# Patient Record
Sex: Male | Born: 1945 | Race: White | Hispanic: No | Marital: Married | Smoking: Former smoker
Health system: Southern US, Community
[De-identification: ages and names within clinical notes are randomized; demographics above are authoritative.]

## PROBLEM LIST (undated history)

## (undated) DIAGNOSIS — I878 Other specified disorders of veins: Secondary | ICD-10-CM

## (undated) DIAGNOSIS — G4730 Sleep apnea, unspecified: Secondary | ICD-10-CM

## (undated) DIAGNOSIS — I00 Rheumatic fever without heart involvement: Secondary | ICD-10-CM

## (undated) DIAGNOSIS — K635 Polyp of colon: Secondary | ICD-10-CM

## (undated) DIAGNOSIS — I714 Abdominal aortic aneurysm, without rupture, unspecified: Secondary | ICD-10-CM

## (undated) DIAGNOSIS — K5790 Diverticulosis of intestine, part unspecified, without perforation or abscess without bleeding: Secondary | ICD-10-CM

## (undated) DIAGNOSIS — E538 Deficiency of other specified B group vitamins: Secondary | ICD-10-CM

## (undated) DIAGNOSIS — N189 Chronic kidney disease, unspecified: Secondary | ICD-10-CM

## (undated) DIAGNOSIS — F329 Major depressive disorder, single episode, unspecified: Secondary | ICD-10-CM

## (undated) DIAGNOSIS — F419 Anxiety disorder, unspecified: Secondary | ICD-10-CM

## (undated) DIAGNOSIS — M1990 Unspecified osteoarthritis, unspecified site: Secondary | ICD-10-CM

## (undated) DIAGNOSIS — Z933 Colostomy status: Secondary | ICD-10-CM

## (undated) DIAGNOSIS — D499 Neoplasm of unspecified behavior of unspecified site: Secondary | ICD-10-CM

## (undated) DIAGNOSIS — F32A Depression, unspecified: Secondary | ICD-10-CM

## (undated) DIAGNOSIS — K5792 Diverticulitis of intestine, part unspecified, without perforation or abscess without bleeding: Secondary | ICD-10-CM

## (undated) DIAGNOSIS — I10 Essential (primary) hypertension: Secondary | ICD-10-CM

## (undated) DIAGNOSIS — B351 Tinea unguium: Secondary | ICD-10-CM

## (undated) DIAGNOSIS — D179 Benign lipomatous neoplasm, unspecified: Secondary | ICD-10-CM

## (undated) DIAGNOSIS — E785 Hyperlipidemia, unspecified: Secondary | ICD-10-CM

## (undated) HISTORY — PX: COLOSTOMY REVERSAL: SHX5782

## (undated) HISTORY — PX: CYST REMOVAL LEG: SHX6280

## (undated) HISTORY — PX: COLOSTOMY: SHX63

## (undated) HISTORY — PX: KNEE ARTHROSCOPY: SUR90

## (undated) HISTORY — PX: LIPOMA EXCISION: SHX5283

## (undated) HISTORY — PX: LITHOTRIPSY: SUR834

## (undated) HISTORY — PX: COLONOSCOPY W/ POLYPECTOMY: SHX1380

---

## 2004-10-25 ENCOUNTER — Ambulatory Visit: Payer: Self-pay

## 2006-02-08 ENCOUNTER — Emergency Department: Payer: Self-pay | Admitting: Emergency Medicine

## 2006-02-08 ENCOUNTER — Other Ambulatory Visit: Payer: Self-pay

## 2008-06-09 ENCOUNTER — Ambulatory Visit: Payer: Self-pay | Admitting: Surgery

## 2008-06-09 ENCOUNTER — Ambulatory Visit: Payer: Self-pay | Admitting: Cardiology

## 2008-06-09 ENCOUNTER — Other Ambulatory Visit: Payer: Self-pay

## 2008-06-12 ENCOUNTER — Ambulatory Visit: Payer: Self-pay | Admitting: Surgery

## 2009-06-11 ENCOUNTER — Ambulatory Visit: Payer: Self-pay | Admitting: Internal Medicine

## 2010-02-04 ENCOUNTER — Ambulatory Visit: Payer: Self-pay | Admitting: Unknown Physician Specialty

## 2012-09-30 ENCOUNTER — Ambulatory Visit: Payer: Self-pay | Admitting: General Practice

## 2012-09-30 LAB — BASIC METABOLIC PANEL
Anion Gap: 3 — ABNORMAL LOW (ref 7–16)
Calcium, Total: 9.3 mg/dL (ref 8.5–10.1)
Chloride: 106 mmol/L (ref 98–107)
EGFR (African American): 57 — ABNORMAL LOW
EGFR (Non-African Amer.): 49 — ABNORMAL LOW
Glucose: 87 mg/dL (ref 65–99)
Osmolality: 283 (ref 275–301)

## 2012-09-30 LAB — URINALYSIS, COMPLETE
Glucose,UR: NEGATIVE mg/dL (ref 0–75)
Ketone: NEGATIVE
Leukocyte Esterase: NEGATIVE
Ph: 6 (ref 4.5–8.0)
Protein: 30
RBC,UR: 2 /HPF (ref 0–5)
Squamous Epithelial: <1

## 2012-09-30 LAB — CBC
MCV: 93 fL (ref 80–100)
RDW: 14 % (ref 11.5–14.5)

## 2012-09-30 LAB — PROTIME-INR: Prothrombin Time: 12.9 secs (ref 11.5–14.7)

## 2012-09-30 LAB — SEDIMENTATION RATE: Erythrocyte Sed Rate: 6 mm/hr (ref 0–20)

## 2012-09-30 LAB — APTT: Activated PTT: 29.3 secs (ref 23.6–35.9)

## 2012-09-30 LAB — MRSA PCR SCREENING

## 2012-10-16 ENCOUNTER — Inpatient Hospital Stay: Payer: Self-pay | Admitting: General Practice

## 2012-10-16 HISTORY — PX: TOTAL KNEE ARTHROPLASTY: SHX125

## 2012-10-17 LAB — BASIC METABOLIC PANEL
BUN: 25 mg/dL — ABNORMAL HIGH (ref 7–18)
Calcium, Total: 8.1 mg/dL — ABNORMAL LOW (ref 8.5–10.1)
Chloride: 106 mmol/L (ref 98–107)
Co2: 25 mmol/L (ref 21–32)
EGFR (African American): >60
EGFR (Non-African Amer.): 55 — ABNORMAL LOW
Osmolality: 280 (ref 275–301)
Sodium: 138 mmol/L (ref 136–145)

## 2012-10-17 LAB — HEMOGLOBIN: HGB: 11.7 g/dL — ABNORMAL LOW (ref 13.0–18.0)

## 2012-10-17 LAB — PLATELET COUNT: Platelet: 137 10*3/uL — ABNORMAL LOW (ref 150–440)

## 2012-10-18 LAB — BASIC METABOLIC PANEL
Anion Gap: 3 — ABNORMAL LOW (ref 7–16)
BUN: 20 mg/dL — ABNORMAL HIGH (ref 7–18)
Co2: 30 mmol/L (ref 21–32)
Creatinine: 1.3 mg/dL (ref 0.60–1.30)
EGFR (African American): >60
EGFR (Non-African Amer.): 57 — ABNORMAL LOW
Sodium: 138 mmol/L (ref 136–145)

## 2013-09-07 IMAGING — CR DG KNEE 1-2V*L*
1 series · 2 of 2 positions shown · non-contrast
Comparison: none

REASON FOR EXAM: postop
COMMENTS:   Bedside (portable):Y

[Series 1: ap · 0.17mm/px · 2 of 2 slices shown]
[im 1/2]
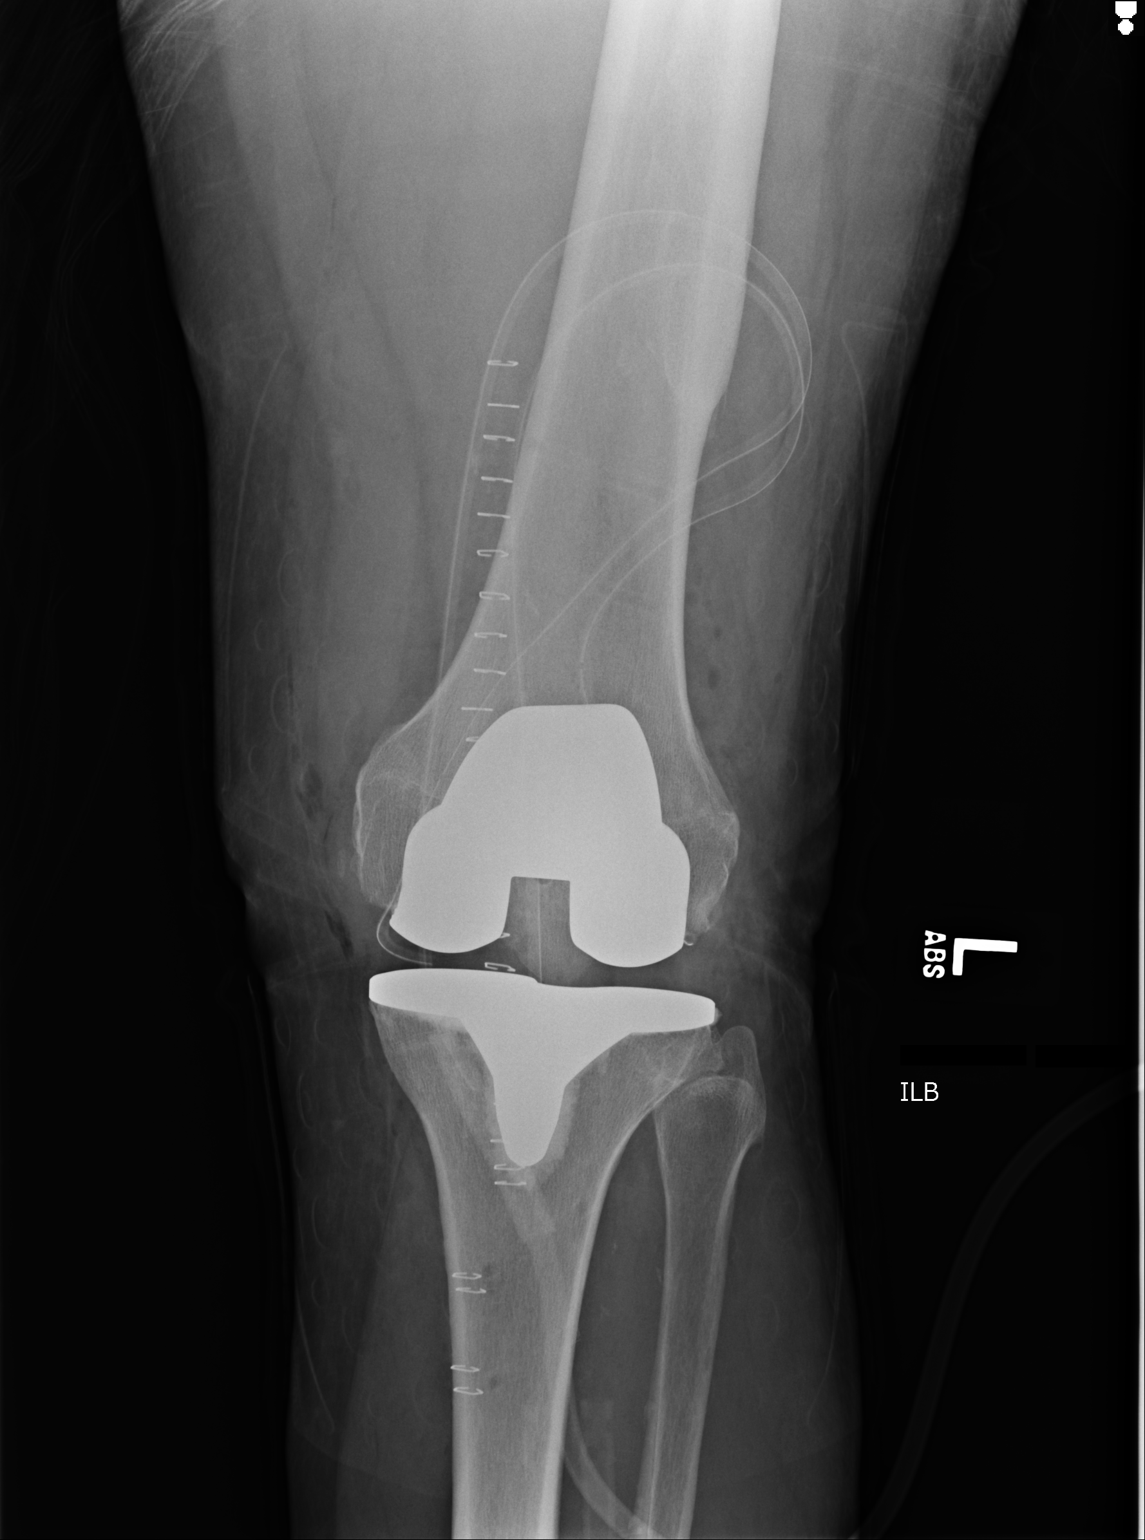
[im 2/2]
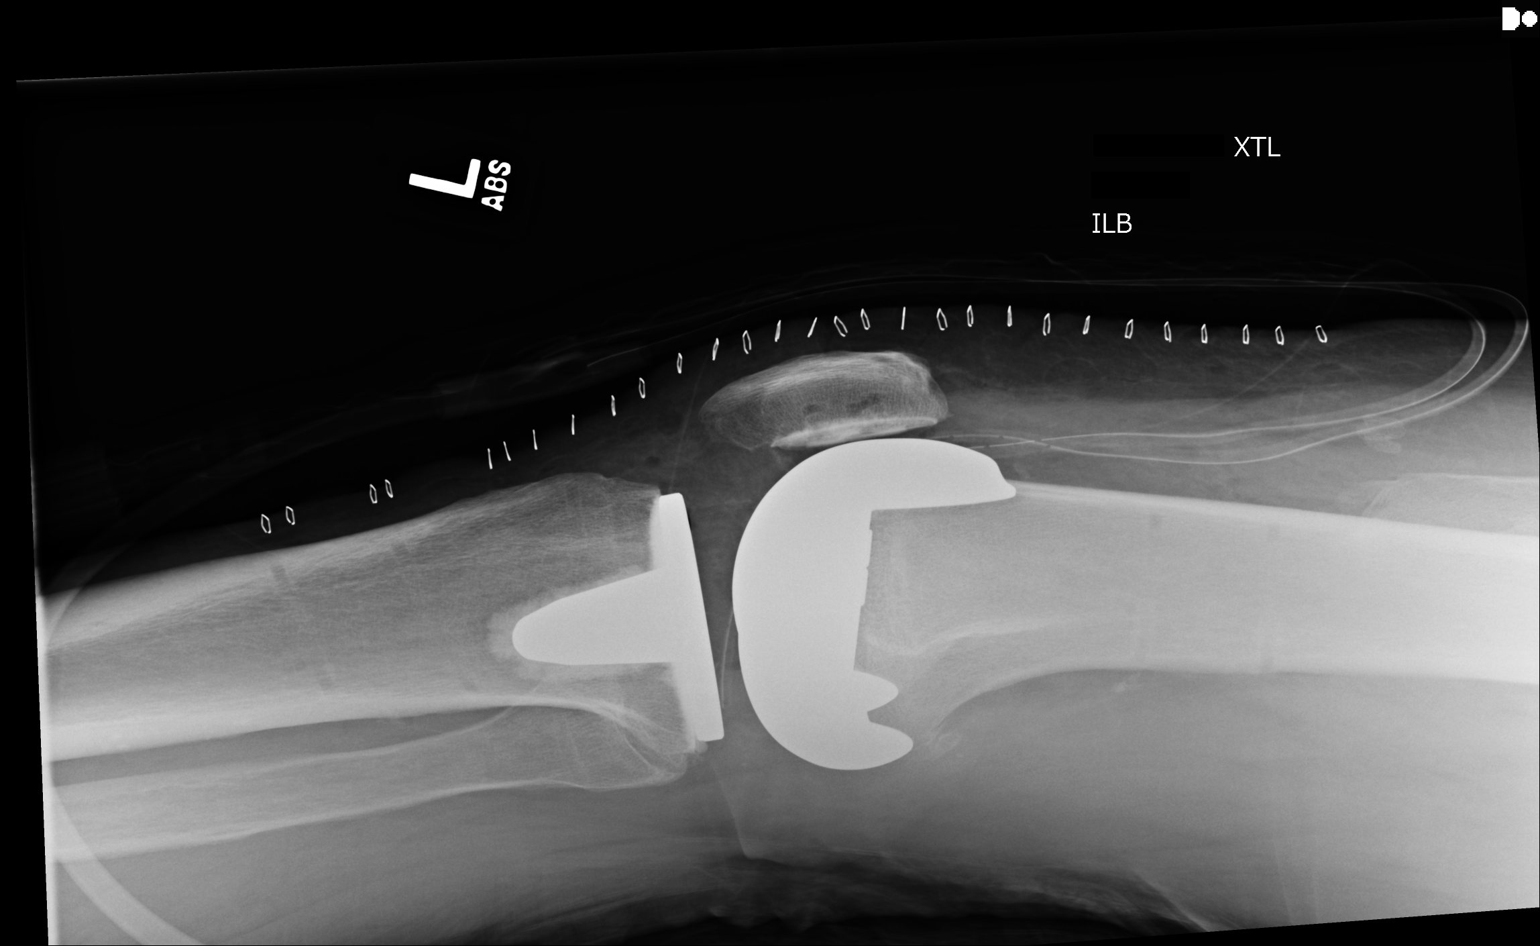

[2 of 2 positions shown; findings below may reference images not displayed]

PROCEDURE:     DXR - DXR KNEE LEFT AP AND LATERAL  - October 16, 2012 [DATE]

RESULT:     AP and lateral portable views of the left knee reveal the
patient to have undergone total joint prosthesis placement. Radiographic
positioning of the prosthetic components is good. Surgical drain lines and
skin staples are present.
IMPRESSION: The patient has undergone left total knee joint prosthesis
placement. Further interpretation is deferred to Dr. Laferia.

[REDACTED]

## 2014-12-17 DIAGNOSIS — N183 Chronic kidney disease, stage 3 unspecified: Secondary | ICD-10-CM | POA: Insufficient documentation

## 2014-12-22 ENCOUNTER — Ambulatory Visit: Payer: Self-pay | Admitting: Internal Medicine

## 2014-12-23 DIAGNOSIS — I714 Abdominal aortic aneurysm, without rupture, unspecified: Secondary | ICD-10-CM | POA: Insufficient documentation

## 2015-02-04 ENCOUNTER — Ambulatory Visit: Payer: Self-pay | Admitting: Unknown Physician Specialty

## 2015-02-07 ENCOUNTER — Emergency Department: Payer: Self-pay | Admitting: Emergency Medicine

## 2015-02-26 ENCOUNTER — Other Ambulatory Visit: Payer: Self-pay | Admitting: Internal Medicine

## 2015-02-26 DIAGNOSIS — I714 Abdominal aortic aneurysm, without rupture, unspecified: Secondary | ICD-10-CM

## 2015-03-02 NOTE — Discharge Summary (Signed)
PATIENT NAME:  Alexander Howe, Alexander Howe MR#:  [id number] DATE OF BIRTH:  11/10/1946  DATE OF ADMISSION:  10/16/2012 DATE OF DISCHARGE:  10/19/2012  ADMITTING DIAGNOSIS: Degenerative arthrosis left knee.   DISCHARGE DIAGNOSIS: Degenerative arthrosis left knee.   HISTORY: Patient is a pleasant 69 year old who has been followed at [facility] for progression of left knee pain. The patient had reported several year history of progressive bilateral knee pain. The left was noted to be more symptomatic than the right. His pain had increased in intensity. It was noted to be aggravated with weight-bearing activities. He had localized most of the pain along the medial aspect of the knee. He had also reported some near giving way of the knee as well as activity-related swelling. Patient had reported only modest improvement with the use of anti-inflammatory medications. He had undergone several series of Synvisc injections with the most recent series of injections providing very little relief of his discomfort. The pain had progressed to the point that it was significantly interfering with his activities of daily living. X-rays taken in [place] showed narrowing of the medial cartilage space with associated varus alignment. He was noted to have osteophyte as well as subchondral sclerosis.   PROCEDURE:  (dictation cut off here) dictation continued on a different page  ____________________________ Vance Peper, PA jrw:cms D: 10/21/2012 10:30:57 ET T: 10/21/2012 10:44:15 ET JOB#: [id number]  cc: Vance Peper, PA, <Dictator>  [name] PA ELECTRONICALLY SIGNED 10/22/2012 7:53

## 2015-03-02 NOTE — Op Note (Signed)
PATIENT NAME:  Alexander Howe, Alexander Howe MR#:  [id number] DATE OF BIRTH:  11/28/1945  DATE OF PROCEDURE:  10/16/2012  PREOPERATIVE DIAGNOSIS: Degenerative arthrosis of the left knee.   POSTOPERATIVE DIAGNOSIS: Degenerative arthrosis of the left knee.   PROCEDURE PERFORMED: Left total knee arthroplasty using computer-assisted navigation.   SURGEON: Laurice Record. Hooten, M.D.   ASSISTANT: Vance Peper, PA-C (required to maintain retraction throughout the procedure)   ANESTHESIA: Femoral nerve block and spinal.   ESTIMATED BLOOD LOSS: 350 mL.   FLUIDS REPLACED: 1500 mL of crystalloid.   TOURNIQUET TIME: 108 minutes.   DRAINS: Two medium drains to reinfusion system.   SOFT TISSUE RELEASES: Anterior cruciate ligament, posterior cruciate ligament, deep and superficial medial collateral ligament, and patellofemoral ligament.   IMPLANTS UTILIZED: DePuy PFC Sigma size 5 posterior stabilized femoral component (cemented), size 6 MBT tibial component (cemented), 41 mm three peg oval dome patella (cemented), and a 10 mm stabilized rotating platform polyethylene insert.   INDICATIONS FOR SURGERY: The patient is a 69 year old male who has been seen for complaints of progressive left knee pain. X-rays demonstrated severe degenerative changes in tricompartmental fashion with relative varus alignment. He denies any significant improvement despite activity modification, intraarticular corticosteroid injections, and Synvisc injections. After discussion of the risks and benefits of surgical intervention, the patient expressed his understanding of the risks and benefits and agreed with plans for surgical intervention.   PROCEDURE IN DETAIL: The patient was brought into the Operating Room and, after adequate femoral nerve block and spinal anesthesia was achieved, a tourniquet was placed on the patient's upper left thigh. The patient's left knee and leg were cleaned and prepped with alcohol and DuraPrep and draped in the usual  sterile fashion. A "time out" was performed as per usual protocol. The left lower extremity was exsanguinated using an Esmarch, and the tourniquet was inflated to 300 mmHg. An anterior longitudinal incision was made followed by a standard mid vastus approach. A moderate effusion was evacuated. The deep fibers of the medial collateral ligament were elevated in subperiosteal fashion off the medial flare of the tibia so as to maintain a continuous soft tissue sleeve. The patella was subluxed laterally and the patellofemoral ligament was incised. Inspection of the knee demonstrated severe degenerative changes in a tricompartmental fashion. Prominent osteophytes were debrided using a rongeur. Anterior and posterior cruciate ligaments were excised. Two 4.0 mm Schanz pins were inserted into the femur and into the tibia for attachment of the array of trackers used for computer-assisted navigation. Hip center was identified using a circumduction technique. Distal landmarks were mapped using the computer. The distal femur and proximal tibia were mapped using the computer. Distal femoral cutting guide was positioned using computer-assisted navigation so as to achieve 5 degree distal valgus cut. The cut was performed and verified using the computer. The distal femur was sized and it was felt that a size 5 femoral component was appropriate. A size 5 cutting guide was positioned and the anterior cut was performed and verified using the computer. This was followed by completion of the posterior and chamfer cuts. Femoral cutting guide for the central box was then positioned and central box cut was performed.   Attention was then directed to the proximal tibia. Medial and lateral menisci were excised. The extramedullary tibial cutting guide was positioned using computer-assisted navigation so as to achieve 0 degree varus valgus alignment and 0 degree posterior slope. The cut was performed and verified using the computer. The  proximal tibia was  sized and it was felt that a size 6 tibial tray was appropriate. Tibial and femoral trials were inserted followed by insertion of a 10 mm polyethylene insert. The knee was felt to be somewhat tight medially. A Cobb elevator was used to elevate the superficial fibers of the medial collateral ligament. This allowed for good medial and lateral soft tissue balancing both in flexion and in extension. Finally, the patella was cut and prepared so as to accommodate a 41 mm three peg oval dome patella. Patellar trial was placed and the knee was placed through a range of motion with excellent patellar tracking appreciated.   Polyethylene trial was removed. Prominent posterior osteophytes were debrided off of the femur using curved osteotomes and curettes. Central post hole for the tibial component was reamed followed by insertion of a keel punch. Trial components were removed. The cut surfaces of bone were irrigated with copious amounts of normal saline with antibiotic solution using pulsatile lavage and then suctioned dry. Polymethyl methacrylate cement was prepared in the usual fashion using a vacuum mixer. Cement was applied to the cut surface of the proximal tibia as well as along the undersurface of a size 6 MBT tibial component. The tibial component was positioned and impacted into place. Excess cement was removed using freer elevators. Cement was then applied to the cut surface of the femur as well as along the posterior flanges of a size 5 posterior stabilized femoral component. Femoral component was positioned and impacted into place. Excess cement was removed using freer elevators. A 10 mm polyethylene trial was inserted and the knee was brought in full extension with steady axial compression applied. Finally, cement was applied to the backside of a 41 mm three peg oval dome patella and the patellar component was positioned and patellar clamp applied. Excess cement was removed using freer  elevators.   After adequate curing of cement, the tourniquet was deflated after total tourniquet time of 108 minutes. Hemostasis was achieved using electrocautery. The knee was irrigated with copious amounts of normal saline with antibiotic solution using pulsatile lavage and then suctioned dry. The knee was inspected for any residual cement debris. 30 mL of 0.25% Marcaine with epinephrine was injected along the posterior capsule. A 10 mm stabilized rotating platform polyethylene insert was inserted and the knee was placed through a range of motion. Excellent patellar tracking was appreciated and excellent mediolateral soft tissue balancing was appreciated.   Two medium drains were placed in the wound bed and brought out through a separate stab incision to be attached to a reinfusion system. The medial parapatellar portion of the incision was reapproximated using interrupted sutures of #1 Vicryl. The subcutaneous tissue was approximated in layers using first #0 Vicryl followed by #2-0 Vicryl. Skin was closed with skin staples. A sterile dressing was applied.   The patient tolerated the procedure well. He was transported to the recovery room in stable condition. ____________________________ Laurice Record. Holley Bouche., MD jph:slb D: 10/16/2012 12:12:26 ET    T: 10/16/2012 12:53:30 ET       JOB#: [id number] cc: Jeneen Rinks P. Holley Bouche., MD, <Dictator> JAMES P Holley Bouche MD ELECTRONICALLY SIGNED 10/16/2012 20:28

## 2015-03-02 NOTE — Discharge Summary (Signed)
PATIENT NAME:  Alexander Howe, Alexander Howe MR#:  [id number] DATE OF BIRTH:  1946/01/12  DATE OF ADMISSION:  10/16/2012 DATE OF DISCHARGE:  10/19/2012  ADDENDUM (Continuation):   After discussion of the risks and benefits of surgical intervention, the patient expressed his understanding of the risks and benefits and agreed for plans for surgical intervention.   PROCEDURE: Left total knee arthroplasty using computer-assisted navigation.   ANESTHESIA: Femoral nerve block with spinal.   SOFT TISSUE RELEASE: Anterior cruciate ligament, posterior cruciate ligament, deep and superficial medial collateral ligaments, as well as the patellofemoral ligament.   IMPLANTS UTILIZED: DePuy PFC Sigma size 5 posterior stabilized femoral component (cemented), size 6 MBT tibial component (cemented), 41 mm three pegged oval dome patella (cemented), and a 10 mm stabilized rotating platform polyethylene insert.   HOSPITAL COURSE: The patient tolerated the procedure very well. He had no complications. He was then taken to PAC-U where he was stabilized and then transferred to the orthopedic floor. The patient began receiving anticoagulation therapy of Lovenox 40 mg sub-Q q.12 hours per anesthesia and pharmacy protocol. He was fitted with TED stockings bilaterally. These were allowed to be removed one hour per eight hour shift. The left one was applied on day two following removal of the Hemovac and dressing change. The patient was also fitted with the AVI compression foot pumps bilaterally set at 80 mmHg. His calves have been nontender. There has been no evidence of any deep venous thromboses. Negative Homans sign. Heels were elevated off the bed using rolled towels.   The patient has denied any chest pain or shortness of breath. He did use CPAP while in the hospital. Vital signs have been stable. He has been afebrile. Hemodynamically he was stable. No transfusions were given other than the Autovac transfusions for six hours  postoperatively.   The patient began receiving physical therapy on day one for gait training and transfers. He has done very well. Upon being discharged he was ambulating greater than 200 feet. He was independent with bed-to-chair transfers. He was able to go up and down four sets of steps. Occupational therapy was also initiated on day one for activities of daily living and assistive devices.   The patient's IV, Foley, and Hemovac were discontinued on day two along with a dressing change. The wound was free of any drainage or any signs of infection. The Polar Care was reapplied to the surgical leg maintaining a temperature of 40 to 50 degrees Fahrenheit.   DISPOSITION: The patient is discharged to home in improved stable condition.   DISCHARGE INSTRUCTIONS: 1. Continue weightbearing as tolerated.  2. Continue using a walker until cleared by physical therapy to go to a quad cane.  3. He will receive home health PT.  4. He is to continue with TED stockings. These are to be worn during the day but may be removed at night.  5. Continue Polar Care maintaining a temperature of 40 to 50 degrees Fahrenheit. Recommend he use this around-the-clock until seen in the office in two weeks. He does have a follow-up appointment in the clinic in two weeks.  6. He is to call the clinic sooner if any temperatures of 101.5 or greater or excessive bleeding.  7. He is placed on a regular diet.  8. He is to resume his regular medications he was on prior to admission. He was given a prescription for Lovenox 40 mg sub-Q daily for 14 days, then discontinue and begin taking one 81 mg enteric-coated aspirin.  Also, a prescription for Ultram 1 to 2 tablets q.4 to 6 hours p.r.n. for pain and oxycodone 5 to 10 mg q.4 to 6 hours p.r.n. for pain.   PAST MEDICAL HISTORY:  1. Hyperlipidemia.  2. Hypertension.  3. Shingles on hand.  4. Kidney stones x2. 5. History of rheumatic fever without history of cardiac murmur while he was  in the 7th grade.  6. Sleep apnea.  7. History of colon polyps.  8. History of diverticulosis. 9. Arthritis. 10. Anxiety with mild depression.    11. Venostasis with chronic leg edema.  12. History of onychomycosis.   ____________________________ Vance Peper, PA jrw:drc D: 10/21/2012 10:44:30 ET T: 10/21/2012 10:58:09 ET JOB#: [id number]  cc: Vance Peper, PA, <Dictator> Sade Mehlhoff PA ELECTRONICALLY SIGNED 10/22/2012 7:53

## 2015-03-08 LAB — SURGICAL PATHOLOGY

## 2015-04-29 ENCOUNTER — Other Ambulatory Visit: Payer: Self-pay | Admitting: Physician Assistant

## 2015-04-29 DIAGNOSIS — M898X9 Other specified disorders of bone, unspecified site: Secondary | ICD-10-CM

## 2015-05-05 ENCOUNTER — Ambulatory Visit
Admission: RE | Admit: 2015-05-05 | Discharge: 2015-05-05 | Disposition: A | Discharge status: Home / Self Care | Payer: Medicare PPO | Source: Ambulatory Visit | Attending: Physician Assistant | Admitting: Physician Assistant

## 2015-05-05 DIAGNOSIS — M25462 Effusion, left knee: Secondary | ICD-10-CM | POA: Insufficient documentation

## 2015-05-05 DIAGNOSIS — M25562 Pain in left knee: Secondary | ICD-10-CM | POA: Diagnosis present

## 2015-05-05 DIAGNOSIS — M898X9 Other specified disorders of bone, unspecified site: Secondary | ICD-10-CM

## 2015-05-11 ENCOUNTER — Other Ambulatory Visit: Payer: Self-pay | Admitting: Orthopedic Surgery

## 2015-05-11 DIAGNOSIS — IMO0002 Reserved for concepts with insufficient information to code with codable children: Secondary | ICD-10-CM

## 2015-05-11 DIAGNOSIS — R229 Localized swelling, mass and lump, unspecified: Principal | ICD-10-CM

## 2015-05-12 ENCOUNTER — Ambulatory Visit
Admission: RE | Admit: 2015-05-12 | Discharge: 2015-05-12 | Disposition: A | Discharge status: Home / Self Care | Payer: Medicare PPO | Source: Ambulatory Visit | Attending: Orthopedic Surgery | Admitting: Orthopedic Surgery

## 2015-05-12 DIAGNOSIS — R229 Localized swelling, mass and lump, unspecified: Principal | ICD-10-CM

## 2015-05-12 DIAGNOSIS — IMO0002 Reserved for concepts with insufficient information to code with codable children: Secondary | ICD-10-CM

## 2015-06-22 ENCOUNTER — Other Ambulatory Visit: Payer: Self-pay | Admitting: Internal Medicine

## 2015-06-22 DIAGNOSIS — I714 Abdominal aortic aneurysm, without rupture, unspecified: Secondary | ICD-10-CM

## 2015-06-28 ENCOUNTER — Ambulatory Visit
Admission: RE | Admit: 2015-06-28 | Discharge: 2015-06-28 | Disposition: A | Discharge status: Home / Self Care | Payer: Medicare PPO | Source: Ambulatory Visit | Attending: Internal Medicine | Admitting: Internal Medicine

## 2015-06-28 DIAGNOSIS — I714 Abdominal aortic aneurysm, without rupture, unspecified: Secondary | ICD-10-CM

## 2015-06-28 DIAGNOSIS — N281 Cyst of kidney, acquired: Secondary | ICD-10-CM | POA: Insufficient documentation

## 2015-11-16 DIAGNOSIS — E7800 Pure hypercholesterolemia, unspecified: Secondary | ICD-10-CM | POA: Insufficient documentation

## 2015-11-16 DIAGNOSIS — G4710 Hypersomnia, unspecified: Secondary | ICD-10-CM | POA: Insufficient documentation

## 2015-11-16 DIAGNOSIS — M1990 Unspecified osteoarthritis, unspecified site: Secondary | ICD-10-CM | POA: Insufficient documentation

## 2015-11-16 DIAGNOSIS — F32A Depression, unspecified: Secondary | ICD-10-CM | POA: Insufficient documentation

## 2015-11-16 DIAGNOSIS — I10 Essential (primary) hypertension: Secondary | ICD-10-CM | POA: Insufficient documentation

## 2015-11-16 DIAGNOSIS — F419 Anxiety disorder, unspecified: Secondary | ICD-10-CM | POA: Insufficient documentation

## 2015-12-14 ENCOUNTER — Encounter
Admission: RE | Admit: 2015-12-14 | Discharge: 2015-12-14 | Disposition: A | Discharge status: Home / Self Care | Payer: Medicare PPO | Source: Ambulatory Visit | Attending: Orthopedic Surgery | Admitting: Orthopedic Surgery

## 2015-12-14 DIAGNOSIS — Z01812 Encounter for preprocedural laboratory examination: Secondary | ICD-10-CM | POA: Insufficient documentation

## 2015-12-14 DIAGNOSIS — Z01810 Encounter for preprocedural cardiovascular examination: Secondary | ICD-10-CM | POA: Diagnosis not present

## 2015-12-14 DIAGNOSIS — G8929 Other chronic pain: Secondary | ICD-10-CM | POA: Diagnosis not present

## 2015-12-14 DIAGNOSIS — M25561 Pain in right knee: Secondary | ICD-10-CM | POA: Insufficient documentation

## 2015-12-14 HISTORY — DX: Abdominal aortic aneurysm, without rupture: I71.4

## 2015-12-14 HISTORY — DX: Abdominal aortic aneurysm, without rupture, unspecified: I71.40

## 2015-12-14 LAB — URINALYSIS COMPLETE WITH MICROSCOPIC (ARMC ONLY)
Bacteria, UA: NONE SEEN
Bilirubin Urine: NEGATIVE
GLUCOSE, UA: NEGATIVE mg/dL
HGB URINE DIPSTICK: NEGATIVE
Ketones, ur: NEGATIVE mg/dL
Leukocytes, UA: NEGATIVE
Nitrite: NEGATIVE
Protein, ur: NEGATIVE mg/dL
SQUAMOUS EPITHELIAL / LPF: NONE SEEN
Specific Gravity, Urine: 1.017 (ref 1.005–1.030)
pH: 6 (ref 5.0–8.0)

## 2015-12-14 LAB — CBC
HEMATOCRIT: 45.6 % (ref 40.0–52.0)
HEMOGLOBIN: 15.2 g/dL (ref 13.0–18.0)
MCH: 31.5 pg (ref 26.0–34.0)
MCHC: 33.3 g/dL (ref 32.0–36.0)
MCV: 94.5 fL (ref 80.0–100.0)
Platelets: 151 10*3/uL (ref 150–440)
RBC: 4.82 MIL/uL (ref 4.40–5.90)
RDW: 13.9 % (ref 11.5–14.5)
WBC: 5.8 10*3/uL (ref 3.8–10.6)

## 2015-12-14 LAB — TYPE AND SCREEN
ABO/RH(D): A POS
ANTIBODY SCREEN: NEGATIVE

## 2015-12-14 LAB — BASIC METABOLIC PANEL
ANION GAP: 6 (ref 5–15)
BUN: 23 mg/dL — ABNORMAL HIGH (ref 6–20)
CALCIUM: 10 mg/dL (ref 8.9–10.3)
CO2: 31 mmol/L (ref 22–32)
Chloride: 104 mmol/L (ref 101–111)
Creatinine, Ser: 1.27 mg/dL — ABNORMAL HIGH (ref 0.61–1.24)
GFR, EST NON AFRICAN AMERICAN: 56 mL/min — AB (ref >60)
Glucose, Bld: 88 mg/dL (ref 65–99)
Potassium: 3.5 mmol/L (ref 3.5–5.1)
SODIUM: 141 mmol/L (ref 135–145)

## 2015-12-14 LAB — PROTIME-INR
INR: 1.06
PROTHROMBIN TIME: 14 s (ref 11.4–15.0)

## 2015-12-14 LAB — APTT: APTT: 28 s (ref 24–36)

## 2015-12-14 LAB — SEDIMENTATION RATE: Sed Rate: 10 mm/hr (ref 0–20)

## 2015-12-14 LAB — ABO/RH: ABO/RH(D): A POS

## 2015-12-14 LAB — SURGICAL PCR SCREEN
MRSA, PCR: NEGATIVE
STAPHYLOCOCCUS AUREUS: NEGATIVE

## 2015-12-14 NOTE — Patient Instructions (Signed)
  Your procedure is scheduled on: 12/27/15 Mon Report to Day Surgery.[facility] To find out your arrival time please call [phone] between 1PM - 3PM on 12/24/15 Fri  Remember: Instructions that are not followed completely may result in serious medical risk, up to and including death, or upon the discretion of your surgeon and anesthesiologist your surgery may need to be rescheduled.    _x___ 1. Do not eat food or drink liquids after midnight. No gum chewing or hard candies.     ____ 2. No Alcohol for 24 hours before or after surgery.   ____ 3. Bring all medications with you on the day of surgery if instructed.    __x_ 4. Notify your doctor if there is any change in your medical condition     (cold, fever, infections).     Do not wear jewelry, make-up, hairpins, clips or nail polish.  Do not wear lotions, powders, or perfumes. You may wear deodorant.  Do not shave 48 hours prior to surgery. Men may shave face and neck.  Do not bring valuables to the hospital.    [facility] is not responsible for any belongings or valuables.               Contacts, dentures or bridgework may not be worn into surgery.  Leave your suitcase in the car. After surgery it may be brought to your room.  For patients admitted to the hospital, discharge time is determined by your                treatment team.   Patients discharged the day of surgery will not be allowed to drive home.   Please read over the following fact sheets that you were given:   MRSA Information   _x___ Take these medicines the morning of surgery with A SIP OF WATER:    1. amLODipine (NORVASC) 5 MG tablet  2. ALPRAZolam (XANAX) 0.5 MG tablet  3. buPROPion (ZYBAN) 150 MG 12 hr tablet  4.pantoprazole (PROTONIX) 40 MG tablet  5.PARoxetine (PAXIL) 10 MG tablet  6.quinapril (ACCUPRIL) 40 MG tablet  ____ Fleet Enema (as directed)   _x___ Use CHG Soap as directed  ____ Use inhalers on the day of surgery  ____ Stop metformin  2 days prior to surgery    ____ Take 1/2 of usual insulin dose the night before surgery and none on the morning of surgery.   _x___ Stop Coumadin/Plavix/aspirin on Stop aspirin 1 week before surgery  ____ Stop Anti-inflammatories on    ____ Stop supplements until after surgery.    _x___ Bring C-Pap to the hospital.

## 2015-12-16 LAB — URINE CULTURE

## 2015-12-22 NOTE — Pre-Procedure Instructions (Signed)
Urine culture results multiple species present suggest recollection, faxed to Dr. Clydell Hakim office asking if recollection is needed.

## 2015-12-23 DIAGNOSIS — F325 Major depressive disorder, single episode, in full remission: Secondary | ICD-10-CM | POA: Insufficient documentation

## 2015-12-24 NOTE — OR Nursing (Signed)
Per note in chart from [facility] Staff, "it is not necessary to repeat urine culture".

## 2015-12-27 ENCOUNTER — Inpatient Hospital Stay: Payer: Medicare PPO | Admitting: Anesthesiology

## 2015-12-27 ENCOUNTER — Inpatient Hospital Stay: Payer: Medicare PPO

## 2015-12-27 ENCOUNTER — Inpatient Hospital Stay
Admission: RE | Admit: 2015-12-27 | Discharge: 2015-12-30 | DRG: 470 | Disposition: A | Discharge status: Home Health | Payer: Medicare PPO | Source: Ambulatory Visit | Attending: Orthopedic Surgery | Admitting: Orthopedic Surgery

## 2015-12-27 ENCOUNTER — Encounter: Payer: Self-pay | Admitting: *Deleted

## 2015-12-27 ENCOUNTER — Encounter
Admission: RE | Disposition: A | Discharge status: Home Health | Payer: Self-pay | Source: Ambulatory Visit | Attending: Orthopedic Surgery

## 2015-12-27 DIAGNOSIS — Z6833 Body mass index (BMI) 33.0-33.9, adult: Secondary | ICD-10-CM | POA: Diagnosis not present

## 2015-12-27 DIAGNOSIS — G8929 Other chronic pain: Secondary | ICD-10-CM | POA: Diagnosis present

## 2015-12-27 DIAGNOSIS — M1711 Unilateral primary osteoarthritis, right knee: Secondary | ICD-10-CM | POA: Diagnosis present

## 2015-12-27 DIAGNOSIS — I129 Hypertensive chronic kidney disease with stage 1 through stage 4 chronic kidney disease, or unspecified chronic kidney disease: Secondary | ICD-10-CM | POA: Diagnosis present

## 2015-12-27 DIAGNOSIS — F419 Anxiety disorder, unspecified: Secondary | ICD-10-CM | POA: Diagnosis present

## 2015-12-27 DIAGNOSIS — F329 Major depressive disorder, single episode, unspecified: Secondary | ICD-10-CM | POA: Diagnosis present

## 2015-12-27 DIAGNOSIS — N189 Chronic kidney disease, unspecified: Secondary | ICD-10-CM | POA: Diagnosis present

## 2015-12-27 DIAGNOSIS — M25761 Osteophyte, right knee: Secondary | ICD-10-CM | POA: Diagnosis present

## 2015-12-27 DIAGNOSIS — Z87891 Personal history of nicotine dependence: Secondary | ICD-10-CM

## 2015-12-27 DIAGNOSIS — G4730 Sleep apnea, unspecified: Secondary | ICD-10-CM | POA: Diagnosis present

## 2015-12-27 DIAGNOSIS — R140 Abdominal distension (gaseous): Secondary | ICD-10-CM

## 2015-12-27 DIAGNOSIS — Z96659 Presence of unspecified artificial knee joint: Secondary | ICD-10-CM

## 2015-12-27 HISTORY — DX: Depression, unspecified: F32.A

## 2015-12-27 HISTORY — DX: Diverticulitis of intestine, part unspecified, without perforation or abscess without bleeding: K57.92

## 2015-12-27 HISTORY — DX: Anxiety disorder, unspecified: F41.9

## 2015-12-27 HISTORY — DX: Colostomy status: Z93.3

## 2015-12-27 HISTORY — DX: Essential (primary) hypertension: I10

## 2015-12-27 HISTORY — DX: Sleep apnea, unspecified: G47.30

## 2015-12-27 HISTORY — PX: KNEE ARTHROPLASTY: SHX992

## 2015-12-27 HISTORY — DX: Major depressive disorder, single episode, unspecified: F32.9

## 2015-12-27 HISTORY — DX: Rheumatic fever without heart involvement: I00

## 2015-12-27 HISTORY — DX: Unspecified osteoarthritis, unspecified site: M19.90

## 2015-12-27 HISTORY — DX: Neoplasm of unspecified behavior of unspecified site: D49.9

## 2015-12-27 HISTORY — DX: Other specified disorders of veins: I87.8

## 2015-12-27 HISTORY — DX: Chronic kidney disease, unspecified: N18.9

## 2015-12-27 SURGERY — ARTHROPLASTY, KNEE, TOTAL, USING IMAGELESS COMPUTER-ASSISTED NAVIGATION
Anesthesia: Spinal | Laterality: Right

## 2015-12-27 MED ORDER — PROPOFOL 10 MG/ML IV BOLUS
INTRAVENOUS | Status: DC | PRN
  Administered 2015-12-27: 30 mg via INTRAVENOUS

## 2015-12-27 MED ORDER — BUPROPION HCL ER (SR) 150 MG PO TB12
150.0000 mg | ORAL_TABLET | Freq: Two times a day (BID) | ORAL | Status: DC
Start: 2015-12-27 — End: 2015-12-30
  Administered 2015-12-27 – 2015-12-30 (×6): 150 mg via ORAL
  Filled 2015-12-27 (×6): qty 1

## 2015-12-27 MED ORDER — CEFAZOLIN SODIUM-DEXTROSE 2-3 GM-% IV SOLR
2.0000 g | Freq: Four times a day (QID) | INTRAVENOUS | Status: AC
  Administered 2015-12-27 – 2015-12-28 (×4): 2 g via INTRAVENOUS
  Filled 2015-12-27 (×5): qty 50

## 2015-12-27 MED ORDER — PROPOFOL 500 MG/50ML IV EMUL
INTRAVENOUS | Status: DC | PRN
  Administered 2015-12-27: 75 ug/kg/min via INTRAVENOUS

## 2015-12-27 MED ORDER — SODIUM CHLORIDE FLUSH 0.9 % IV SOLN
INTRAVENOUS | Status: AC
  Filled 2015-12-27: qty 3

## 2015-12-27 MED ORDER — FUROSEMIDE 20 MG PO TABS: 20.0000 mg | ORAL_TABLET | Freq: Every day | ORAL | Status: DC | PRN

## 2015-12-27 MED ORDER — MAGNESIUM HYDROXIDE 400 MG/5ML PO SUSP
30.0000 mL | Freq: Every day | ORAL | Status: DC | PRN
  Administered 2015-12-28: 30 mL via ORAL
  Filled 2015-12-27: qty 30

## 2015-12-27 MED ORDER — PHENOL 1.4 % MT LIQD
1.0000 | OROMUCOSAL | Status: DC | PRN
Start: 2015-12-27 — End: 2015-12-30

## 2015-12-27 MED ORDER — ACETAMINOPHEN 10 MG/ML IV SOLN
1000.0000 mg | Freq: Four times a day (QID) | INTRAVENOUS | Status: AC
  Administered 2015-12-27 – 2015-12-28 (×4): 1000 mg via INTRAVENOUS
  Filled 2015-12-27 (×4): qty 100

## 2015-12-27 MED ORDER — ONDANSETRON HCL 4 MG/2ML IJ SOLN: 4.0000 mg | Freq: Once | INTRAMUSCULAR | Status: DC | PRN

## 2015-12-27 MED ORDER — PHENYLEPHRINE HCL 10 MG/ML IJ SOLN
INTRAMUSCULAR | Status: DC | PRN
  Administered 2015-12-27: 100 ug via INTRAVENOUS

## 2015-12-27 MED ORDER — VITAMIN D 1000 UNITS PO TABS
2000.0000 [IU] | ORAL_TABLET | Freq: Every day | ORAL | Status: DC
  Administered 2015-12-27 – 2015-12-30 (×4): 2000 [IU] via ORAL
  Filled 2015-12-27 (×4): qty 2

## 2015-12-27 MED ORDER — LACTATED RINGERS IV SOLN
INTRAVENOUS | Status: DC
Start: 2015-12-27 — End: 2015-12-27
  Administered 2015-12-27 (×3): via INTRAVENOUS

## 2015-12-27 MED ORDER — OXYCODONE HCL 5 MG PO TABS
5.0000 mg | ORAL_TABLET | ORAL | Status: DC | PRN
  Administered 2015-12-27: 5 mg via ORAL
  Administered 2015-12-27 – 2015-12-28 (×3): 10 mg via ORAL
  Administered 2015-12-28: 5 mg via ORAL
  Administered 2015-12-28 (×2): 10 mg via ORAL
  Administered 2015-12-28: 5 mg via ORAL
  Administered 2015-12-29: 10 mg via ORAL
  Administered 2015-12-29: 5 mg via ORAL
  Filled 2015-12-27 (×2): qty 2
  Filled 2015-12-27 (×2): qty 1
  Filled 2015-12-27: qty 2
  Filled 2015-12-27: qty 1
  Filled 2015-12-27 (×3): qty 2
  Filled 2015-12-27: qty 1

## 2015-12-27 MED ORDER — ONDANSETRON HCL 4 MG/2ML IJ SOLN
INTRAMUSCULAR | Status: DC | PRN
Start: 2015-12-27 — End: 2015-12-27
  Administered 2015-12-27: 4 mg via INTRAVENOUS

## 2015-12-27 MED ORDER — MAGNESIUM OXIDE 400 (241.3 MG) MG PO TABS
400.0000 mg | ORAL_TABLET | Freq: Every day | ORAL | Status: DC
  Administered 2015-12-27 – 2015-12-30 (×4): 400 mg via ORAL
  Filled 2015-12-27 (×4): qty 1

## 2015-12-27 MED ORDER — DIPHENHYDRAMINE HCL 12.5 MG/5ML PO ELIX: 12.5000 mg | ORAL_SOLUTION | ORAL | Status: DC | PRN

## 2015-12-27 MED ORDER — CEFAZOLIN SODIUM-DEXTROSE 2-3 GM-% IV SOLR: 2.0000 g | Freq: Once | INTRAVENOUS | Status: DC

## 2015-12-27 MED ORDER — SODIUM CHLORIDE 0.9 % IV SOLN
INTRAVENOUS | Status: DC | PRN
  Administered 2015-12-27: 60 mL

## 2015-12-27 MED ORDER — PAROXETINE HCL 10 MG PO TABS
10.0000 mg | ORAL_TABLET | Freq: Every day | ORAL | Status: DC
  Administered 2015-12-28 – 2015-12-30 (×3): 10 mg via ORAL
  Filled 2015-12-27 (×3): qty 1

## 2015-12-27 MED ORDER — TRAMADOL HCL 50 MG PO TABS
50.0000 mg | ORAL_TABLET | ORAL | Status: DC | PRN
  Administered 2015-12-27 (×2): 50 mg via ORAL
  Administered 2015-12-28 – 2015-12-29 (×4): 100 mg via ORAL
  Filled 2015-12-27 (×2): qty 1
  Filled 2015-12-27 (×4): qty 2

## 2015-12-27 MED ORDER — OMEGA-3-ACID ETHYL ESTERS 1 G PO CAPS
1.0000 g | ORAL_CAPSULE | Freq: Every day | ORAL | Status: DC
  Administered 2015-12-28 – 2015-12-30 (×3): 1 g via ORAL
  Filled 2015-12-27 (×3): qty 1

## 2015-12-27 MED ORDER — BUPIVACAINE-EPINEPHRINE (PF) 0.25% -1:200000 IJ SOLN
INTRAMUSCULAR | Status: AC
  Filled 2015-12-27: qty 30

## 2015-12-27 MED ORDER — FENTANYL CITRATE (PF) 100 MCG/2ML IJ SOLN: 25.0000 ug | INTRAMUSCULAR | Status: DC | PRN

## 2015-12-27 MED ORDER — ACETAMINOPHEN 10 MG/ML IV SOLN
INTRAVENOUS | Status: DC | PRN
  Administered 2015-12-27: 1000 mg via INTRAVENOUS

## 2015-12-27 MED ORDER — KETAMINE HCL 10 MG/ML IJ SOLN
INTRAMUSCULAR | Status: DC | PRN
  Administered 2015-12-27: 50 mg via INTRAVENOUS

## 2015-12-27 MED ORDER — MORPHINE SULFATE (PF) 2 MG/ML IV SOLN
2.0000 mg | INTRAVENOUS | Status: DC | PRN
  Administered 2015-12-27 (×2): 2 mg via INTRAVENOUS
  Filled 2015-12-27 (×2): qty 1

## 2015-12-27 MED ORDER — METOPROLOL SUCCINATE ER 100 MG PO TB24
100.0000 mg | ORAL_TABLET | Freq: Every day | ORAL | Status: DC
  Administered 2015-12-27 – 2015-12-29 (×3): 100 mg via ORAL
  Filled 2015-12-27 (×3): qty 1

## 2015-12-27 MED ORDER — SODIUM CHLORIDE 0.9 % IV SOLN
INTRAVENOUS | Status: DC
  Administered 2015-12-27 – 2015-12-28 (×2): via INTRAVENOUS

## 2015-12-27 MED ORDER — NEOMYCIN-POLYMYXIN B GU 40-200000 IR SOLN
Status: AC
  Filled 2015-12-27: qty 20

## 2015-12-27 MED ORDER — ONDANSETRON HCL 4 MG/2ML IJ SOLN
4.0000 mg | Freq: Four times a day (QID) | INTRAMUSCULAR | Status: DC | PRN
  Administered 2015-12-27 – 2015-12-29 (×2): 4 mg via INTRAVENOUS
  Filled 2015-12-27 (×2): qty 2

## 2015-12-27 MED ORDER — SODIUM CHLORIDE 0.9 % IV SOLN
Freq: Once | INTRAVENOUS | Status: AC
  Administered 2015-12-27: 16:00:00 via INTRAVENOUS

## 2015-12-27 MED ORDER — BISACODYL 10 MG RE SUPP
10.0000 mg | Freq: Every day | RECTAL | Status: DC | PRN
  Administered 2015-12-29: 10 mg via RECTAL
  Filled 2015-12-27: qty 1

## 2015-12-27 MED ORDER — CEFAZOLIN SODIUM-DEXTROSE 2-3 GM-% IV SOLR
INTRAVENOUS | Status: AC
Start: 2015-12-27 — End: 2015-12-27
  Administered 2015-12-27: 2 g via INTRAVENOUS
  Filled 2015-12-27: qty 50

## 2015-12-27 MED ORDER — ONDANSETRON HCL 4 MG PO TABS: 4.0000 mg | ORAL_TABLET | Freq: Four times a day (QID) | ORAL | Status: DC | PRN

## 2015-12-27 MED ORDER — HYDROCHLOROTHIAZIDE 25 MG PO TABS
25.0000 mg | ORAL_TABLET | Freq: Every day | ORAL | Status: DC
  Administered 2015-12-28 – 2015-12-29 (×2): 25 mg via ORAL
  Filled 2015-12-27 (×2): qty 1

## 2015-12-27 MED ORDER — BUPIVACAINE-EPINEPHRINE 0.25% -1:200000 IJ SOLN
INTRAMUSCULAR | Status: DC | PRN
  Administered 2015-12-27: 30 mL

## 2015-12-27 MED ORDER — METOCLOPRAMIDE HCL 10 MG PO TABS
10.0000 mg | ORAL_TABLET | Freq: Three times a day (TID) | ORAL | Status: AC
  Administered 2015-12-27 – 2015-12-29 (×8): 10 mg via ORAL
  Filled 2015-12-27 (×8): qty 1

## 2015-12-27 MED ORDER — MIDAZOLAM HCL 5 MG/5ML IJ SOLN
INTRAMUSCULAR | Status: DC | PRN
  Administered 2015-12-27: 2 mg via INTRAVENOUS

## 2015-12-27 MED ORDER — HEPARIN SOD (PORK) LOCK FLUSH 100 UNIT/ML IV SOLN
INTRAVENOUS | Status: AC
  Administered 2015-12-27: 100 [IU]
  Filled 2015-12-27: qty 5

## 2015-12-27 MED ORDER — NEOMYCIN-POLYMYXIN B GU 40-200000 IR SOLN
Status: DC | PRN
  Administered 2015-12-27: 14 mL

## 2015-12-27 MED ORDER — FERROUS SULFATE 325 (65 FE) MG PO TABS
325.0000 mg | ORAL_TABLET | Freq: Two times a day (BID) | ORAL | Status: DC
  Administered 2015-12-28 – 2015-12-30 (×4): 325 mg via ORAL
  Filled 2015-12-27 (×4): qty 1

## 2015-12-27 MED ORDER — BUPIVACAINE HCL (PF) 0.5 % IJ SOLN
INTRAMUSCULAR | Status: DC | PRN
  Administered 2015-12-27: 3 mL

## 2015-12-27 MED ORDER — PANTOPRAZOLE SODIUM 40 MG PO TBEC
40.0000 mg | DELAYED_RELEASE_TABLET | Freq: Two times a day (BID) | ORAL | Status: DC
  Administered 2015-12-27 – 2015-12-30 (×6): 40 mg via ORAL
  Filled 2015-12-27 (×6): qty 1

## 2015-12-27 MED ORDER — POTASSIUM CHLORIDE CRYS ER 20 MEQ PO TBCR
20.0000 meq | EXTENDED_RELEASE_TABLET | Freq: Every day | ORAL | Status: DC
  Administered 2015-12-27 – 2015-12-30 (×4): 20 meq via ORAL
  Filled 2015-12-27 (×4): qty 1

## 2015-12-27 MED ORDER — ALUM & MAG HYDROXIDE-SIMETH 200-200-20 MG/5ML PO SUSP: 30.0000 mL | ORAL | Status: DC | PRN

## 2015-12-27 MED ORDER — AMLODIPINE BESYLATE 5 MG PO TABS
5.0000 mg | ORAL_TABLET | Freq: Every day | ORAL | Status: DC
  Administered 2015-12-28 – 2015-12-30 (×3): 5 mg via ORAL
  Filled 2015-12-27 (×3): qty 1

## 2015-12-27 MED ORDER — ACETAMINOPHEN 10 MG/ML IV SOLN
INTRAVENOUS | Status: AC
  Filled 2015-12-27: qty 100

## 2015-12-27 MED ORDER — SENNOSIDES-DOCUSATE SODIUM 8.6-50 MG PO TABS
1.0000 | ORAL_TABLET | Freq: Two times a day (BID) | ORAL | Status: DC
  Administered 2015-12-27 – 2015-12-29 (×5): 1 via ORAL
  Filled 2015-12-27 (×6): qty 1

## 2015-12-27 MED ORDER — ACETAMINOPHEN 650 MG RE SUPP: 650.0000 mg | Freq: Four times a day (QID) | RECTAL | Status: DC | PRN

## 2015-12-27 MED ORDER — FLEET ENEMA 7-19 GM/118ML RE ENEM
1.0000 | ENEMA | Freq: Once | RECTAL | Status: AC | PRN
  Administered 2015-12-29: 1 via RECTAL

## 2015-12-27 MED ORDER — QUINAPRIL HCL 10 MG PO TABS
40.0000 mg | ORAL_TABLET | Freq: Every day | ORAL | Status: DC
  Filled 2015-12-27 (×2): qty 4

## 2015-12-27 MED ORDER — MENTHOL 3 MG MT LOZG: 1.0000 | LOZENGE | OROMUCOSAL | Status: DC | PRN

## 2015-12-27 MED ORDER — ALPRAZOLAM 0.5 MG PO TABS: 0.5000 mg | ORAL_TABLET | Freq: Two times a day (BID) | ORAL | Status: DC | PRN

## 2015-12-27 MED ORDER — ENOXAPARIN SODIUM 30 MG/0.3ML ~~LOC~~ SOLN
30.0000 mg | Freq: Two times a day (BID) | SUBCUTANEOUS | Status: DC
  Administered 2015-12-28 – 2015-12-30 (×5): 30 mg via SUBCUTANEOUS
  Filled 2015-12-27 (×5): qty 0.3

## 2015-12-27 MED ORDER — SODIUM CHLORIDE 0.9 % IJ SOLN
INTRAMUSCULAR | Status: AC
  Filled 2015-12-27: qty 50

## 2015-12-27 MED ORDER — BUPIVACAINE LIPOSOME 1.3 % IJ SUSP
INTRAMUSCULAR | Status: AC
Start: 2015-12-27 — End: 2015-12-27
  Filled 2015-12-27: qty 20

## 2015-12-27 MED ORDER — ACETAMINOPHEN 325 MG PO TABS
650.0000 mg | ORAL_TABLET | Freq: Four times a day (QID) | ORAL | Status: DC | PRN
  Administered 2015-12-29 – 2015-12-30 (×2): 650 mg via ORAL
  Filled 2015-12-27 (×2): qty 2

## 2015-12-27 SURGICAL SUPPLY — 58 items
AUTOTRANSFUS HAS 1/8 (MISCELLANEOUS) ×2
BATTERY INSTRU NAVIGATION (MISCELLANEOUS) ×8 IMPLANT
BLADE SAW 1 (BLADE) ×2 IMPLANT
BLADE SAW 1/2 (BLADE) ×2 IMPLANT
CANISTER SUCT 1200ML W/VALVE (MISCELLANEOUS) ×2 IMPLANT
CANISTER SUCT 3000ML (MISCELLANEOUS) ×4 IMPLANT
CAP KNEE TOTAL 3 SIGMA ×2 IMPLANT
CATH TRAY METER 16FR LF (MISCELLANEOUS) ×2 IMPLANT
CEMENT HV SMART SET (Cement) ×4 IMPLANT
COOLER POLAR GLACIER W/PUMP (MISCELLANEOUS) ×2 IMPLANT
DRAPE SHEET LG 3/4 BI-LAMINATE (DRAPES) ×2 IMPLANT
DRSG DERMACEA 8X12 NADH (GAUZE/BANDAGES/DRESSINGS) ×2 IMPLANT
DRSG OPSITE POSTOP 4X14 (GAUZE/BANDAGES/DRESSINGS) ×2 IMPLANT
DRSG TEGADERM 4X4.75 (GAUZE/BANDAGES/DRESSINGS) ×2 IMPLANT
DURAPREP 26ML APPLICATOR (WOUND CARE) ×4 IMPLANT
ELECT CAUTERY BLADE 6.4 (BLADE) ×2 IMPLANT
ELECT REM PT RETURN 9FT ADLT (ELECTROSURGICAL) ×2
ELECTRODE REM PT RTRN 9FT ADLT (ELECTROSURGICAL) ×1 IMPLANT
EX-PIN ORTHOLOCK NAV 4X150 (PIN) ×4 IMPLANT
GLOVE BIOGEL M STRL SZ7.5 (GLOVE) ×4 IMPLANT
GLOVE INDICATOR 8.0 STRL GRN (GLOVE) ×2 IMPLANT
GLOVE SURG 9.0 ORTHO LTXF (GLOVE) ×2 IMPLANT
GLOVE SURG ORTHO 9.0 STRL STRW (GLOVE) ×2 IMPLANT
GOWN STRL REUS W/ TWL LRG LVL3 (GOWN DISPOSABLE) ×2 IMPLANT
GOWN STRL REUS W/ TWL LRG LVL4 (GOWN DISPOSABLE) ×1 IMPLANT
GOWN STRL REUS W/TWL 2XL LVL3 (GOWN DISPOSABLE) ×2 IMPLANT
GOWN STRL REUS W/TWL LRG LVL3 (GOWN DISPOSABLE) ×2
GOWN STRL REUS W/TWL LRG LVL4 (GOWN DISPOSABLE) ×1
HANDPIECE SUCTION TUBG SURGILV (MISCELLANEOUS) ×2 IMPLANT
HOLDER FOLEY CATH W/STRAP (MISCELLANEOUS) ×2 IMPLANT
HOOD PEEL AWAY FLYTE STAYCOOL (MISCELLANEOUS) ×4 IMPLANT
KIT RM TURNOVER STRD PROC AR (KITS) ×2 IMPLANT
KNIFE SCULPS 14X20 (INSTRUMENTS) ×2 IMPLANT
NDL SAFETY 18GX1.5 (NEEDLE) ×2 IMPLANT
NEEDLE SPNL 20GX3.5 QUINCKE YW (NEEDLE) ×2 IMPLANT
NS IRRIG 500ML POUR BTL (IV SOLUTION) ×2 IMPLANT
PACK TOTAL KNEE (MISCELLANEOUS) ×2 IMPLANT
PAD WRAPON POLAR KNEE (MISCELLANEOUS) ×1 IMPLANT
PIN DRILL QUICK PACK ×2 IMPLANT
PIN FIXATION 1/8DIA X 3INL (PIN) ×2 IMPLANT
SOL .9 NS 3000ML IRR  AL (IV SOLUTION) ×1
SOL .9 NS 3000ML IRR UROMATIC (IV SOLUTION) ×1 IMPLANT
SOL PREP PVP 2OZ (MISCELLANEOUS) ×2
SOLUTION PREP PVP 2OZ (MISCELLANEOUS) ×1 IMPLANT
SPONGE DRAIN TRACH 4X4 STRL 2S (GAUZE/BANDAGES/DRESSINGS) ×2 IMPLANT
STAPLER SKIN PROX 35W (STAPLE) ×2 IMPLANT
SUCTION FRAZIER HANDLE 10FR (MISCELLANEOUS) ×1
SUCTION TUBE FRAZIER 10FR DISP (MISCELLANEOUS) ×1 IMPLANT
SUT VIC AB 0 CT1 36 (SUTURE) ×2 IMPLANT
SUT VIC AB 1 CT1 36 (SUTURE) ×4 IMPLANT
SUT VIC AB 2-0 CT2 27 (SUTURE) ×2 IMPLANT
SYR 20CC LL (SYRINGE) ×2 IMPLANT
SYR 30ML LL (SYRINGE) ×2 IMPLANT
SYR 50ML LL SCALE MARK (SYRINGE) ×2 IMPLANT
SYSTEM AUTOTRANSFUS DUAL TROCR (MISCELLANEOUS) ×1 IMPLANT
TOWEL OR 17X26 4PK STRL BLUE (TOWEL DISPOSABLE) ×2 IMPLANT
TOWER CARTRIDGE SMART MIX (DISPOSABLE) ×2 IMPLANT
WRAPON POLAR PAD KNEE (MISCELLANEOUS) ×2

## 2015-12-27 NOTE — Transfer of Care (Signed)
Immediate Anesthesia Transfer of Care Note  Patient: Alexander Howe  Procedure(s) Performed: Procedure(s): COMPUTER ASSISTED TOTAL KNEE ARTHROPLASTY (Right)  Patient Location: PACU  Anesthesia Type:Spinal  Level of Consciousness: sedated  Airway & Oxygen Therapy: Patient Spontanous Breathing and Patient connected to face mask oxygen  Post-op Assessment: Report given to RN  Post vital signs: Reviewed  Last Vitals:  Filed Vitals:   12/27/15 0611 12/27/15 1114  BP: 154/88 93/57  Pulse: 53   Temp: 36.9 C 36.5 C  Resp: 16 12    Complications: No apparent anesthesia complications

## 2015-12-27 NOTE — H&P (Signed)
The patient has been re-examined, and the chart reviewed, and there have been no interval changes to the documented history and physical.    The risks, benefits, and alternatives have been discussed at length. The patient expressed understanding of the risks benefits and agreed with plans for surgical intervention.  James P. Hooten, Jr. M.D.    

## 2015-12-27 NOTE — Anesthesia Preprocedure Evaluation (Signed)
Anesthesia Evaluation  Patient identified by MRN, date of birth, ID band Patient awake    Reviewed: Allergy & Precautions, H&P , NPO status , Patient's Chart, lab work & pertinent test results, reviewed documented beta blocker date and time   Airway Mallampati: III   Neck ROM: full    Dental  (+) Poor Dentition, Teeth Intact   Pulmonary neg pulmonary ROS, sleep apnea and Continuous Positive Airway Pressure Ventilation , former smoker,    Pulmonary exam normal        Cardiovascular hypertension, negative cardio ROS Normal cardiovascular exam  AAA   Neuro/Psych PSYCHIATRIC DISORDERS negative neurological ROS  negative psych ROS   GI/Hepatic negative GI ROS, Neg liver ROS,   Endo/Other  negative endocrine ROS  Renal/GU Renal diseasenegative Renal ROS  negative genitourinary   Musculoskeletal   Abdominal   Peds  Hematology negative hematology ROS (+)   Anesthesia Other Findings Past Medical History:   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   Anxiety                                                      Neoplasm                                                       Comment:benign, colon   Chronic kidney disease                                       Hypertension                                                 Depression                                                   Diverticulitis                                               Rheumatic fever                                              Sleep apnea  Osteoarthritis                                               Venous stasis                                                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[facility])                                Past Surgical History:   TOTAL KNEE ARTHROPLASTY                         Left 10/16/2012    COLOSTOMY REVERSAL                                           COLOSTOMY                                                     LITHOTRIPSY                                                 BMI    Body Mass Index   33.65 kg/m 2     Reproductive/Obstetrics negative OB ROS                             Anesthesia Physical Anesthesia Plan  ASA: III  Anesthesia Plan: General and Spinal   Post-op Pain Management:    Induction:   Airway Management Planned:   Additional Equipment:   Intra-op Plan:   Post-operative Plan:   Informed Consent: I have reviewed the patients History and Physical, chart, labs and discussed the procedure including the risks, benefits and alternatives for the proposed anesthesia with the patient or authorized representative who has indicated his/her understanding and acceptance.   Dental Advisory Given  Plan Discussed with: CRNA  Anesthesia Plan Comments:         Anesthesia Quick Evaluation

## 2015-12-27 NOTE — Brief Op Note (Signed)
12/27/2015  11:17 AM  PATIENT:  Alexander Howe  70 y.o. male  PRE-OPERATIVE DIAGNOSIS:  Degenerative arthrosis of the right knee  POST-OPERATIVE DIAGNOSIS:  degenerative arthrosis right knee  PROCEDURE:  Procedure(s): COMPUTER ASSISTED TOTAL KNEE ARTHROPLASTY (Right)  SURGEON:  Surgeon(s) and Role:    * Dereck Leep, MD - Primary  ASSISTANTS: Vance Peper, PA   ANESTHESIA:   spinal  EBL:  Total I/O In: 2000 [I.V.:2000] Out: 150 [Urine:100; Blood:50]  BLOOD ADMINISTERED:none  DRAINS: 2 medium drains to a reinfusion system   LOCAL MEDICATIONS USED:  MARCAINE    and OTHER Exparel  SPECIMEN:  No Specimen  DISPOSITION OF SPECIMEN:  N/A  COUNTS:  YES  TOURNIQUET:   113 minutes  DICTATION: .Dragon Dictation  PLAN OF CARE: Admit to inpatient   PATIENT DISPOSITION:  PACU - hemodynamically stable.   Delay start of Pharmacological VTE agent (>24hrs) due to surgical blood loss or risk of bleeding: yes

## 2015-12-27 NOTE — Evaluation (Signed)
Physical Therapy Evaluation Patient Details Name: Alexander Howe MRN: [id number] DOB: [birth year] Today's Date: 12/27/2015   History of Present Illness  Pt underwent R TKR without reported post-op complications. Pt is POD#0 at time of evaluation. Pt reports 1 fall in the last 12 months. Pt reports L TKR approximately 3 years ago  Clinical Impression  Pt demonstrates excellent sequencing and strength with bed mobility, transfers, and limited ambulation from bed to recliner. AAROM is very good for POD#0 and is limited currently due to dressings without reported pain. Pt able to perform 10 SLR and 10 SAQ without assistance. Pt with good safety awareness during mobility however reports increase in nausea following transfer to recliner. RN is present and is administering nausea meds following termination of PT session. Pt will be safe to return home with HHPT and should utilize a rolling walker for ambulation. Pt will benefit from skilled PT services to address deficits in strength, balance, and mobility in order to return to full function at home.     Follow Up Recommendations Home health PT    Equipment Recommendations  None recommended by PT    Recommendations for Other Services       Precautions / Restrictions Precautions Precautions: Knee;Fall Precaution Booklet Issued: Yes (comment) Restrictions Weight Bearing Restrictions: Yes RLE Weight Bearing: Weight bearing as tolerated      Mobility  Bed Mobility Overal bed mobility: Needs Assistance Bed Mobility: Supine to Sit     Supine to sit: Min guard     General bed mobility comments: Pt demonstrates good R hip flexion and adduction strength. CGA provided to minimize velocity of R knee flexion once upright at EOB. Pt denies dizziness with position change. HOB elevated and bed rails utilized  Transfers Overall transfer level: Needs assistance Equipment used: Rolling walker (2 wheeled) Transfers: Sit to/from Stand Sit to Stand:  Min guard         General transfer comment: Pt demonstrates safe hand placement without cues. Goods sequencing and weight shift to RLE noted. Good stability once upright with bilateral UE support. Once standing pt denies dizziness but reports increase in nausea.  Ambulation/Gait Ambulation/Gait assistance: Min guard Ambulation Distance (Feet): 3 Feet Assistive device: Rolling walker (2 wheeled) Gait Pattern/deviations: Step-to pattern Gait velocity: Decreased   General Gait Details: Pt able to take short steps to transfer from bed to recliner. Cues for proper sequencing with rolling walker. Pt with decreased weight shift to RLE during ambulation  Stairs            Wheelchair Mobility    Modified Rankin (Stroke Patients Only)       Balance Overall balance assessment: Needs assistance Sitting-balance support: No upper extremity supported Sitting balance-Leahy Scale: Normal     Standing balance support: Bilateral upper extremity supported Standing balance-Leahy Scale: Fair                               Pertinent Vitals/Pain Pain Assessment: 0-10 Pain Score: 5  Pain Location: R knee Pain Intervention(s): Limited activity within patient's tolerance;Monitored during session;Premedicated before session;RN gave pain meds during session    [place] expects to be discharged to:: Private residence Living Arrangements: Spouse/significant other Available Help at Discharge: Family Type of Home: House Home Access: Stairs to enter Entrance Stairs-Rails: Psychiatric nurse of Steps: 3 Home Layout: Multi-level;Bed/bath upstairs Home Equipment: Environmental consultant - 2 wheels;Cane - single point      Prior  Function Level of Independence: Independent         Comments: Full community ambulator without assistive device     Hand Dominance   Dominant Hand: Right    Extremity/Trunk Assessment   Upper Extremity Assessment: Overall WFL for  tasks assessed           Lower Extremity Assessment: RLE deficits/detail RLE Deficits / Details: LLE grossly WFL. Pt able to perform full R SLR and R SAQ without assist. Full bilateral DF/PF. Pt reports intact sensation to light touch throughout RLE       Communication   Communication: No difficulties  Cognition Arousal/Alertness: Awake/alert Behavior During Therapy: WFL for tasks assessed/performed Overall Cognitive Status: Within Functional Limits for tasks assessed                      General Comments      Exercises Total Joint Exercises Ankle Circles/Pumps: Strengthening;Both;10 reps;Supine Quad Sets: Strengthening;Both;10 reps;Supine Gluteal Sets: Strengthening;Both;10 reps;Supine Towel Squeeze: Strengthening;Both;10 reps;Supine Short Arc Quad: Strengthening;Right;10 reps;Supine Heel Slides: Strengthening;Right;10 reps;Supine Hip ABduction/ADduction: Strengthening;Right;10 reps;Supine Straight Leg Raises: Strengthening;Right;10 reps;Supine Goniometric ROM: -2 to 110 degrees AAROM, limited due to dressings. Pt denies pain with AAROM      Assessment/Plan    PT Assessment Patient needs continued PT services  PT Diagnosis Difficulty walking;Abnormality of gait;Generalized weakness;Acute pain   PT Problem List Decreased strength;Decreased range of motion;Decreased activity tolerance;Decreased balance;Decreased mobility;Pain  PT Treatment Interventions DME instruction;Gait training;Stair training;Functional mobility training;Therapeutic exercise;Balance training;Neuromuscular re-education;Patient/family education;Manual techniques   PT Goals (Current goals can be found in the Care Plan section) Acute Rehab PT Goals Patient Stated Goal: Return home with improved function and less pain PT Goal Formulation: With patient/family Time For Goal Achievement: 01/10/16 Potential to Achieve Goals: Good    Frequency BID   Barriers to discharge        Co-evaluation                End of Session Equipment Utilized During Treatment: Gait belt Activity Tolerance: Patient tolerated treatment well Patient left: in chair;with call bell/phone within reach;with chair alarm set;with family/visitor present;with nursing/sitter in room (RN administering nausea meds) Nurse Communication: Mobility status         Time: 1520-1600 PT Time Calculation (min) (ACUTE ONLY): 40 min   Charges:   PT Evaluation $PT Eval Low Complexity: 1 Procedure PT Treatments $Therapeutic Exercise: 8-22 mins   PT G Codes:       Lyndel Safe Huprich PT, DPT   Huprich,Jason 12/27/2015, 4:16 PM

## 2015-12-27 NOTE — Anesthesia Procedure Notes (Addendum)
Date/Time: 12/27/2015 7:35 AM Performed by: Nelda Marseille Pre-anesthesia Checklist: Patient identified, Emergency Drugs available, Suction available, Patient being monitored and Timeout performed Oxygen Delivery Method: Simple face mask   Spinal Patient location during procedure: OR Start time: 12/27/2015 7:25 AM End time: 12/27/2015 7:32 AM Staffing Resident/CRNA: Nelda Marseille Performed by: resident/CRNA  Preanesthetic Checklist Completed: patient identified, site marked, surgical consent, pre-op evaluation, timeout performed, IV checked, risks and benefits discussed and monitors and equipment checked Spinal Block Patient position: sitting Prep: Betadine Patient monitoring: heart rate, continuous pulse ox, blood pressure and cardiac monitor Approach: midline Location: L4-5 Injection technique: single-shot Needle Needle type: Whitacre and Introducer  Needle gauge: 24 G Needle length: 9 cm Assessment Sensory level: T10 Additional Notes Negative paresthesia. Negative blood return. Positive free-flowing CSF. Expiration date of kit checked and confirmed. Patient tolerated procedure well, without complications.

## 2015-12-27 NOTE — Progress Notes (Addendum)
Night nurse came to me quickly to inform me that the autovac was leaking around the seal when she spiked the blood.  It appears that seal is not sealing so the blood runs out and around where it was spiked.  Dr Marry Guan and Dr Rudene Christians notified of this. Nursing is unable to transfuse the blood due to this defect

## 2015-12-27 NOTE — Op Note (Signed)
OPERATIVE NOTE  DATE OF SURGERY:  12/27/2015  PATIENT NAME:  AUBREY POWELSON   DOB: [birth year]  MRN: [id number]  PRE-OPERATIVE DIAGNOSIS: Degenerative arthrosis of the right knee, primary  POST-OPERATIVE DIAGNOSIS:  Same  PROCEDURE:  Right total knee arthroplasty using computer-assisted navigation  SURGEON:  Marciano Sequin. M.D.  ASSISTANT:  Vance Peper, PA (present and scrubbed throughout the case, critical for assistance with exposure, retraction, instrumentation, and closure)  ANESTHESIA: spinal  ESTIMATED BLOOD LOSS: 50 mL  FLUIDS REPLACED: 2000 mL of crystalloid  TOURNIQUET TIME: 113 minutes  DRAINS: 2 medium drains to a reinfusion system  SOFT TISSUE RELEASES: Anterior cruciate ligament, posterior cruciate ligament, deep and superficial medial collateral ligament, patellofemoral ligament   IMPLANTS UTILIZED: DePuy PFC Sigma size 5 posterior stabilized femoral component (cemented), size 6 MBT tibial component (cemented), 41 mm 3 peg oval dome patella (cemented), and a 10 mm stabilized rotating platform polyethylene insert.  INDICATIONS FOR SURGERY: COLTAN FRANCHETTI is a 70 y.o. year old male with a long history of progressive knee pain. X-rays demonstrated severe degenerative changes in tricompartmental fashion. The patient had not seen any significant improvement despite conservative nonsurgical intervention. After discussion of the risks and benefits of surgical intervention, the patient expressed understanding of the risks benefits and agree with plans for total knee arthroplasty.   The risks, benefits, and alternatives were discussed at length including but not limited to the risks of infection, bleeding, nerve injury, stiffness, blood clots, the need for revision surgery, cardiopulmonary complications, among others, and they were willing to proceed.  PROCEDURE IN DETAIL: The patient was brought into the operating room and, after adequate spinal anesthesia was achieved, a  tourniquet was placed on the patient's upper thigh. The patient's knee and leg were cleaned and prepped with alcohol and DuraPrep and draped in the usual sterile fashion. A "timeout" was performed as per usual protocol. The lower extremity was exsanguinated using an Esmarch, and the tourniquet was inflated to 300 mmHg. An anterior longitudinal incision was made followed by a standard mid vastus approach. The deep fibers of the medial collateral ligament were elevated in a subperiosteal fashion off of the medial flare of the tibia so as to maintain a continuous soft tissue sleeve. The patella was subluxed laterally and the patellofemoral ligament was incised. Inspection of the knee demonstrated severe degenerative changes with full-thickness loss of articular cartilage. Osteophytes were debrided using a rongeur. Anterior and posterior cruciate ligaments were excised. Two 4.0 mm Schanz pins were inserted in the femur and into the tibia for attachment of the array of trackers used for computer-assisted navigation. Hip center was identified using a circumduction technique. Distal landmarks were mapped using the computer. The distal femur and proximal tibia were mapped using the computer. The distal femoral cutting guide was positioned using computer-assisted navigation so as to achieve a 5 distal valgus cut. The femur was sized and it was felt that a size 5 femoral component was appropriate. A size 5 femoral cutting guide was positioned and the anterior cut was performed and verified using the computer. This was followed by completion of the posterior and chamfer cuts. Femoral cutting guide for the central box was then positioned in the center box cut was performed.  Attention was then directed to the proximal tibia. Medial and lateral menisci were excised. The extramedullary tibial cutting guide was positioned using computer-assisted navigation so as to achieve a 0 varus-valgus alignment and 0 posterior slope. The  cut was performed  and verified using the computer. The proximal tibia was sized and it was felt that a size 6 tibial tray was appropriate. Tibial and femoral trials were inserted followed by insertion of a 10 mm polyethylene insert. The knee was felt to be tight medially. A Cobb elevator was used to elevate the superficial fibers of the medial collateral ligament in a subperiosteal fashion. This allowed for excellent mediolateral soft tissue balancing both in flexion and in full extension. Finally, the patella was cut and prepared so as to accommodate a 41 mm 3 peg oval dome patella. A patella trial was placed and the knee was placed through a range of motion with excellent patellar tracking appreciated. The femoral trial was removed after debridement of posterior osteophytes. The central post-hole for the tibial component was reamed followed by insertion of a keel punch. Tibial trials were then removed. Cut surfaces of bone were irrigated with copious amounts of normal saline with antibiotic solution using pulsatile lavage and then suctioned dry. Polymethylmethacrylate cement was prepared in the usual fashion using a vacuum mixer. Cement was applied to the cut surface of the proximal tibia as well as along the undersurface of a size 6 MBT tibial component. Tibial component was positioned and impacted into place. Excess cement was removed using [employer] fast streamer. Cement was then applied to the cut surfaces of the femur as well as along the posterior flanges of the size 5 femoral component. The femoral component was positioned and impacted into place. Excess cement was removed using [employer] fast streamer. A 10 mm polyethylene trial was inserted and the knee was brought into full extension with steady axial compression applied. Finally, cement was applied to the backside of a 41 mm 3 peg oval dome patella and the patellar component was positioned and patellar clamp applied. Excess cement was removed using [employer] fast streamer.  After adequate curing of the cement, the tourniquet was deflated after a total tourniquet time of 113 minutes. Hemostasis was achieved using electrocautery. The knee was irrigated with copious amounts of normal saline with antibiotic solution using pulsatile lavage and then suctioned dry. 20 mL of 1.3% Exparel in 40 mL of normal saline was injected along the posterior capsule, medial and lateral gutters, and along the arthrotomy site. A 10 mm stabilized rotating platform polyethylene insert was inserted and the knee was placed through a range of motion with excellent mediolateral soft tissue balancing appreciated and excellent patellar tracking noted. 2 medium drains were placed in the wound bed and brought out through separate stab incisions to be attached to a reinfusion system. The medial parapatellar portion of the incision was reapproximated using interrupted sutures of #1 Vicryl. Subcutaneous tissue was then injected with a total of 30 cc of 0.25% Marcaine with epinephrine. Subcutaneous tissue was approximated in layers using first #0 Vicryl followed #2-0 Vicryl. The skin was approximated with skin staples. A sterile dressing was applied.  The patient tolerated the procedure well and was transported to the recovery room in stable condition.    Odysseus Cada P. Holley Bouche., M.D.

## 2015-12-27 NOTE — Pre-Procedure Instructions (Signed)
See Office Note from Dr. Caryl Comes with clearance for surgery:  Progress Notes - in this encounter Cheryll Cockayne, MD - 12/23/2015 11:00 AM EST Formatting of this note may be different from the original. [place] Ropes is evaluated for hypertension, sleep apnea, arthritis, vitamin D deficiency, anxiety and depression, B12 deficiency, AAA. Continues to have trouble with his knees, and he is going to move forward with knee replacement surgery. He has not had any chest pain, shortness of breath, palpitations, edema, syncope; he has no significant history of coronary artery disease, heart failure, valvular heart disease, or dysrhythmia. his functional capacity has been good. Did have some recent diverticulitis; this has resolved. No other abdominal pain. Had ultrasound in August which showed stability of aneurysm. No claudication symptoms. Sleeping well. Depression seems to be doing well with his current regimen. His blood pressure has been well controlled  ROS Please see HPI; remainder of complete 10 point ROS is negative  Current Outpatient Prescriptions  Medication Sig Dispense Refill  . ALPRAZolam (XANAX) 0.5 MG tablet Take 0.5 tablets (0.25 mg total) by mouth 2 (two) times daily as needed for Anxiety. 90 tablet 1  . aspirin 81 MG EC tablet Take 81 mg by mouth once daily.  Marland Kitchen atorvastatin (LIPITOR) 80 MG tablet Take 1 tablet (80 mg total) by mouth nightly. 90 tablet 3  . buPROPion (WELLBUTRIN SR) 150 MG SR tablet TAKE 1 TABLET TWICE A DAY 180 tablet 1  . cholecalciferol (VITAMIN D3) 2,000 unit capsule Take 2,000 Units by mouth once daily.  Marland Kitchen HYDROcodone-acetaminophen (NORCO) 5-325 mg tablet Take 1 tablet by mouth every 4 (four) hours as needed for Pain. Earliest Fill Date: 05/15/15 25 tablet 0  . magnesium oxide (MAG-OX) 400 mg tablet Take 400 mg by mouth once daily.  . metoprolol succinate (TOPROL-XL) 100 MG XL tablet Take 1 tablet (100 mg total) by mouth once daily. 90 tablet 3  . omega-3  fatty acids-fish oil 360-1,200 mg Cap Take by mouth nightly.  . pantoprazole (PROTONIX) 40 MG DR tablet Take 1 tablet (40 mg total) by mouth once daily. 90 tablet 3  . potassium chloride (K-DUR,KLOR-CON) 20 MEQ ER tablet TAKE 1 TABLET DAILY 90 tablet 10  . hydroCHLOROthiazide (HYDRODIURIL) 25 MG tablet Take 1 tablet (25 mg total) by mouth once daily. 90 tablet 3  . PARoxetine (PAXIL) 10 MG tablet Take 1 tablet (10 mg total) by mouth once daily. 90 tablet 3  . quinapril (ACCUPRIL) 40 MG tablet Take 1 tablet (40 mg total) by mouth once daily. 90 tablet 3   No current facility-administered medications for this visit.   Allergies as of 12/23/2015 Elta Guadeloupe as Reviewed 12/23/2015  Allergen Reaction Noted  . Pneumococcal vaccine Swelling 05/05/2014   Past Medical History  Diagnosis Date  . AAA (abdominal aortic aneurysm) without rupture (CMS-HCC) 12/23/2014  3.3 cm by U/S 2/16  . Abdominal aortic aneurysm (AAA) (CMS-HCC)  3.5cm  . Anxiety, unspecified  . B12 deficiency 12/17/2014  . Benign neoplasm of colon  . CKD (chronic kidney disease) stage 3, GFR 30-59 ml/min 12/17/2014  . Depression, unspecified  Mild  . Essential hypertension, benign  . History of colon polyps  . History of diverticulosis  with diverticular rupture requiring colostomy formation and subsequent reversal in the 1990s.  . History of onychomycosis  Lamisil treatment 2008  . History of renal stone  . History of rheumatic fever  without history of cardiac murmur  . Hypersomnia with sleep apnea, unspecified  .  Lipoma  Right thigh with excision by Dr. Tamala Julian 2009  . Osteoarthrosis, unspecified whether generalized or localized, lower leg  . Other and unspecified hyperlipidemia, unspecified  . Sun-damaged skin  extensive. followed by Dr. Koleen Nimrod  . Unspecified vitamin D deficiency  . Venous stasis  with chronic leg edema   Past Surgical History  Procedure Laterality Date  . 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 Lithotripsy  . Cyst removed from left leg  . Excision of right thigh lipoma by dr. Rochel Brome 2009  . Knee arthroscopy Bilateral  . Colonoscopy 05/07/2002  Adenomatous Polyps  . Colonoscopy 07/02/2004  Adenomatous Polyp  . Colonoscopy 02/04/2010  Adenomatous Polyp: CBF 01/2015; recall ltr mailed 12/11/2014 (dw)  . Colonoscopy 02/04/2015  Adenomatous Polyps: CBF 01/2018  . Left total knee arthroplasty 10/16/2012  Dr. Marry Guan   Results for orders placed or performed in visit on 12/16/15  CBC w/auto Differential (5 Part)  Result Value Ref Range  WBC (White Blood Cell Count) 6.8 4.1 - 10.2 10^3/uL  RBC (Red Blood Cell Count) 4.87 4.69 - 6.13 10^6/uL  Hemoglobin 15.5 14.1 - 18.1 gm/dL  Hematocrit 46.7 40.0 - 52.0 %  MCV (Mean Corpuscular Volume) 95.9 80.0 - 100.0 fl  MCH (Mean Corpuscular Hemoglobin) 31.8 (H) 27.0 - 31.2 pg  MCHC (Mean Corpuscular Hemoglobin Concentration) 33.2 32.0 - 36.0 gm/dL  Platelet Count 177 150 - 450 10^3/uL  RDW-CV (Red Cell Distribution Width) 13.2 11.6 - 14.8 %  MPV (Mean Platelet Volume) 11.7 (H) 8.0 - 10.0 fl  Neutrophils 4.05 1.50 - 7.80 10^3/uL  Lymphocytes 1.80 1.00 - 3.60 10^3/uL  Monocytes 0.74 0.00 - 1.50 10^3/uL  Eosinophils 0.14 0.00 - 0.55 10^3/uL  Basophils 0.05 0.00 - 0.09 10^3/uL  Neutrophil % 59.5 32.0 - 70.0 %  Lymphocyte % 26.5 10.0 - 50.0 %  Monocyte % 10.9 4.0 - 13.0 %  Eosinophil % 2.1 1.0 - 5.0 %  Basophil% 0.7 0.0 - 2.0 %  Immature Granulocyte % 0.3 <=0.7 %  Immature Granulocyte Count 0.02 <=0.06 10^3/L  Comprehensive Metabolic Panel (CMP)  Result Value Ref Range  Glucose 89 70 - 110 mg/dL  Sodium 144 136 - 145 mmol/L  Potassium 4.4 3.6 - 5.1 mmol/L  Chloride 102 97 - 109 mmol/L  Carbon Dioxide (CO2) 32.9 (H) 22.0 - 32.0 mmol/L  Urea Nitrogen (BUN) 19 7 - 25 mg/dL  Creatinine 1.3 0.7 - 1.3 mg/dL  Glomerular Filtration Rate (eGFR), MDRD Estimate 55 (L) >60 mL/min/1.73sq m  Calcium 10.5 (H) 8.7 - 10.3 mg/dL   AST 15 8 - 39 U/L  ALT 17 6 - 57 U/L  Alk Phos (alkaline Phosphatase) 25 (L) 34 - 104 U/L  Albumin 4.0 3.5 - 4.8 g/dL  Bilirubin, Total 1.2 0.3 - 1.2 mg/dL  Protein, Total 6.5 6.1 - 7.9 g/dL  A/G Ratio 1.6 1.0 - 5.0 gm/dL  Lipid Panel w/calc LDL  Result Value Ref Range  Cholesterol, Total 103 100 - 200 mg/dL  Triglyceride 116 35 - 199 mg/dL  HDL (High Density Lipoprotein) Cholesterol 29.5 29.0 - 71.0 mg/dL  LDL (Low Density Lipoprotien), Calculated 50 0 - 130 mg/dL  VLDL Cholesterol 23 mg/dL  Cholesterol/HDL Ratio 3.5  Microalbumin, Random Urine  Result Value Ref Range  Urine Albumin, Random 60 mg/L  PSA, Total (Screen)  Result Value Ref Range  PSA (Prostate Specific Antigen), Total 1.75 0.10 - 4.00 ng/mL  Narrative  Test results were determined with Beckman Coulter Hybritech Assay. Values  obtained with different assay methods cannot be used interchangeably in serial testing. Assay results should not be interpreted as absolute evidence of the presence or absence of malignant disease  Thyroid Stimulating-Hormone (TSH)  Result Value Ref Range  Thyroid Stimulating Hormone (TSH) 2.066 0.340 - 5.600 uIU/mL  Urinalysis w/Microscopic  Result Value Ref Range  Color Yellow Yellow, Straw  Clarity Clear Clear  Specific Gravity 1.015 1.000 - 1.030  pH, Urine 6.5 5.0 - 8.0  Protein, Urinalysis Trace Negative, Trace mg/dL  Glucose, Urinalysis Negative Negative mg/dL  Ketones, Urinalysis Negative Negative mg/dL  Blood, Urinalysis Negative Negative  Nitrite, Urinalysis Negative Negative  Leukocyte Esterase, Urinalysis Negative Negative  White Blood Cells, Urinalysis 0-3 None Seen, 0-3 /hpf  Red Blood Cells, Urinalysis None Seen None Seen, 0-3 /hpf  Bacteria, Urinalysis Rare (!) None Seen /hpf  Squamous Epithelial Cells, Urinalysis None Seen Rare, Few, None Seen /hpf  Hyaline Casts, Urinalysis  Vitamin B12  Result Value Ref Range  Vitamin B12 631 >300 pg/mL  Narrative  <200 pg/mL:  Low, consistent with Vitamin B12 Deficiency 200-300 pg/mL: Borderline, possible Vitamin B12 Deficiency >300 pg/mL: Normal. Vitamin B12 Deficiency is unlikely   Visit Vitals  . BP 138/79  . Pulse 58  . Ht 185.4 cm (_0 )  . Wt (!) 117 kg (258 lb)  . SpO2 98%  . BMI 34.04 kg/m2   GENERAL: The patient is an obese male in no distress. HEENT: Pupils equal and reactive to light. Extraocular motion intact. Oropharynx is moist with dental implants on the right lower side.  NECK: Supple without thyromegaly; trachea midline CHEST: Normal to palpation.  LUNGS: Clear to auscultation and percussion. No retractions or wheezing.  CARDIAC: Regular rate and rhythm, normal S1 and S2 without murmurs, rubs or gallops. VASCULAR: Carotid and radial pulses 2+. Distal pulses 2+.  ABDOMEN: Soft, nondistended. Positive bowel sounds. Postop changes. Mild tenderness to palpation along the left side with superficial hematoma EXTREMITIES: No clubbing, cyanosis. No significant edema. Distal hair loss. NEUROLOGIC: Cranial nerves grossly intact. Decreased light touch in the tips of his fingers, stable. DERMATOLOGIC: Diffuse sun damage without other significant rashes or nodules.  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 Hypertension-remains at target with current treatment. Again encouraged efforts at weight reduction. Follow met b, u/a, tsh periodically  2. Hyperlipidemia-controlled with current treatment. Follow liver/lipids periodically  3. Sleep apnea. Encouraged to continue use of CPAP whenever sleeping; he feels he  gets benefit from this.  4. Vitamin D deficiency-continue use of supplement. Follow vit D levels periodically 5. B12 deficiency. At target with current supplement; follow levels, CBC. intrinsic factor Antibody level negative 6. Osteoarthritis-appears to be at low risk for perioperative cardiopulmonary complication; does not appear to need any further cardiac evaluation before proceeding with surgery unless his preoperative EKG and labs are significantly abnormal. He does have sleep apnea, and have encouraged him to take his CPAP in with him; his oximetry will also need to be watched closely during and after anesthesia. F/u with Ortho as scheduled 7. Chronic kidney disease stage 3-this level has remained stable. Avoid anti-inflammatories. Follow met b, u/a, microalbumin periodically  8. Depression/anxiety. Continues to do very well with his  current regimen 9. AAA. No evidence of progression. Continue efforts to control blood pressure, cholesterol as we are doing. Encourage physical activity. Continue to monitor periodically. 10. Health maintenance-follow-up in 1 month to reassess after surgery, returning sooner if needed.   [place] III, MD  Portions of this note were created using dictation software and may contain typographical errors

## 2015-12-27 NOTE — Progress Notes (Signed)
Patient tolerated the chair for about 1 hour.  Oxycodone, tramadol and morphine used for pain

## 2015-12-28 ENCOUNTER — Encounter: Payer: Self-pay | Admitting: Orthopedic Surgery

## 2015-12-28 LAB — CBC
HCT: 36.9 % — ABNORMAL LOW (ref 40.0–52.0)
HEMOGLOBIN: 12.3 g/dL — AB (ref 13.0–18.0)
MCH: 31.1 pg (ref 26.0–34.0)
MCHC: 33.4 g/dL (ref 32.0–36.0)
MCV: 93 fL (ref 80.0–100.0)
Platelets: 126 10*3/uL — ABNORMAL LOW (ref 150–440)
RBC: 3.96 MIL/uL — AB (ref 4.40–5.90)
RDW: 13.9 % (ref 11.5–14.5)
WBC: 9.7 10*3/uL (ref 3.8–10.6)

## 2015-12-28 LAB — BASIC METABOLIC PANEL
Anion gap: 2 — ABNORMAL LOW (ref 5–15)
BUN: 20 mg/dL (ref 6–20)
CHLORIDE: 100 mmol/L — AB (ref 101–111)
CO2: 32 mmol/L (ref 22–32)
CREATININE: 1.28 mg/dL — AB (ref 0.61–1.24)
Calcium: 8 mg/dL — ABNORMAL LOW (ref 8.9–10.3)
GFR calc non Af Amer: 55 mL/min — ABNORMAL LOW (ref >60)
Glucose, Bld: 103 mg/dL — ABNORMAL HIGH (ref 65–99)
Potassium: 4 mmol/L (ref 3.5–5.1)
SODIUM: 134 mmol/L — AB (ref 135–145)

## 2015-12-28 MED ORDER — LISINOPRIL 20 MG PO TABS
40.0000 mg | ORAL_TABLET | Freq: Every day | ORAL | Status: DC
  Administered 2015-12-28 – 2015-12-30 (×3): 40 mg via ORAL
  Filled 2015-12-28 (×3): qty 2

## 2015-12-28 NOTE — Progress Notes (Signed)
Physical Therapy Treatment Patient Details Name: Alexander Howe MRN: [id number] DOB: [birth year] Today's Date: 12/28/2015    History of Present Illness Pt underwent R TKR without reported post-op complications. Pt is POD#0 at time of evaluation. Pt reports 1 fall in the last 12 months. Pt reports L TKR approximately 3 years ago    PT Comments    Pt notes pain under control, but definitely sore in the right knee. Pt does continue with nausea although improving. Pt improving overall bed mobility to Modified independence. Slow to move primarily due to nausea. Pt/spouse education on exercises and stretching in supine, seated and stand. Pt demonstrating good understanding of stretching right knee in extension and flexion. Pt stretches spends increased time stretching into R knee flexion while being educated regarding exercises/function at home. Pt ambulates bed to chair, but does not feel up to further ambulation at this time due to nausea and "swimming" head after taking medications. Pt has questions regarding stair climbing technique; pt verbally educated. Will progress ambulation this afternoon session and more than likely progress stair climbing the following day. Continue PT for progression of right knee active range of motion, strength and all functional mobility to allow an optimal, safe return home.   Follow Up Recommendations  Home health PT     Equipment Recommendations  None recommended by PT    Recommendations for Other Services       Precautions / Restrictions Precautions Precautions: Knee;Fall Restrictions Weight Bearing Restrictions: Yes RLE Weight Bearing: Weight bearing as tolerated    Mobility  Bed Mobility Overal bed mobility: Modified Independent Bed Mobility: Supine to Sit     Supine to sit: Modified independent (Device/Increase time)     General bed mobility comments: Increased time/effort primarily due to mild nausea and needing to move  slowly  Transfers Overall transfer level: Needs assistance Equipment used: Rolling walker (2 wheeled) Transfers: Sit to/from Stand Sit to Stand: Min assist         General transfer comment: Cues for safe hand placment and instruction on using BLEs as equally as possible. Slow to rise and steady  Ambulation/Gait Ambulation/Gait assistance: Min guard Ambulation Distance (Feet): 5 Feet Assistive device: Rolling walker (2 wheeled) Gait Pattern/deviations: Step-to pattern Gait velocity: Decreased Gait velocity interpretation: <1.8 ft/sec, indicative of risk for recurrent falls General Gait Details: bed to chair with slow guarded steps, but good effort   Stairs            Wheelchair Mobility    Modified Rankin (Stroke Patients Only)       Balance Overall balance assessment: Needs assistance         Standing balance support: Bilateral upper extremity supported Standing balance-Leahy Scale: Fair                      Cognition Arousal/Alertness: Awake/alert Behavior During Therapy: WFL for tasks assessed/performed Overall Cognitive Status: Within Functional Limits for tasks assessed                      Exercises Total Joint Exercises Ankle Circles/Pumps: AROM;Both;20 reps;Supine Quad Sets: Strengthening;Both;20 reps;Supine (also performed in stand 20x on right) Short Arc Quad: AROM;Right;20 reps;Supine Long Arc Quad: AROM;Right;20 reps;Seated Knee Flexion: AAROM;Right;Other reps (comment);Seated (performs for 15 minutes self assisted stretch btn LAQ ) Goniometric ROM: -1-86 (AAROM)    General Comments        Pertinent Vitals/Pain Pain Assessment: 0-10 Pain Score: 3  Pain Location: R knee  Pain Intervention(s): Monitored during session;Limited activity within patient's tolerance;Premedicated before session;Repositioned;Ice applied    Home Living                      Prior Function            PT Goals (current goals can now  be found in the care plan section) Progress towards PT goals: Progressing toward goals    Frequency  BID    PT Plan Current plan remains appropriate    Co-evaluation             End of Session Equipment Utilized During Treatment: Gait belt Activity Tolerance: Patient tolerated treatment well Patient left: in chair;with call bell/phone within reach;with family/visitor present (polar care in place; pt/spouse did not wish alarm set)     Time: [id number] PT Time Calculation (min) (ACUTE ONLY): 44 min  Charges:  $Gait Training: 8-22 mins $Therapeutic Exercise: 23-37 mins                    G Codes:      [name] 12/28/2015, 11:43 AM

## 2015-12-28 NOTE — Care Management Note (Addendum)
Case Management Note  Patient Details  Name: Alexander Howe MRN: [id number] Date of Birth: [birth year]  Subjective/Objective:                  Met with patient to discuss discharge planning. He would like to return home with his wife and would like to use [facility]. He has a front-wheeled walker and declines bedside commode or 3 in 1. He uses [facility] for [phone].  Action/Plan: List of home health agencies provided to patient. Lovenox 53m #14 called in to [facility]([phone] Expected Discharge Date:  12/30/15               Expected Discharge Plan:     In-House Referral:     Discharge planning Services  CM Consult  Post Acute Care Choice:  Home Health Choice offered to:  Patient  DME Arranged:    DME Agency:     HH Arranged:  PT HH Agency:  [facility] Status of Service:  In process, will continue to follow  Medicare Important Message Given:    Date Medicare IM Given:    Medicare IM give by:    Date Additional Medicare IM Given:    Additional Medicare Important Message give by:     If discussed at [facility] Meetings, dates discussed:    Additional Comments: Lovenox $16.   AMarshell Garfinkel RN 12/28/2015, 9:10 AM

## 2015-12-28 NOTE — Progress Notes (Signed)
   Subjective: 1 Day Post-Op Procedure(s) (LRB): COMPUTER ASSISTED TOTAL KNEE ARTHROPLASTY (Right) Patient reports pain as 2 on 0-10 scale.   Patient is well, and has had no acute complaints or problems We will start therapy today.  Plan is to go Home after hospital stay. no nausea and no vomiting Patient denies any chest pains or shortness of breath. Pt did excellent yesterday with therapy  Objective: Vital signs in last 24 hours: Temp:  [97 F (36.1 C)-98 F (36.7 C)] 98 F (36.7 C) (02/14 0417) Pulse Rate:  [45-57] 55 (02/14 0417) Resp:  [0-20] 18 (02/14 0417) BP: (93-150)/(43-89) 135/73 mmHg (02/14 0417) SpO2:  [93 %-100 %] 93 % (02/14 0417) FiO2 (%):  [21 %] 21 % (02/13 1255) Pt still has original dressing on Heels are non tender and elevated off the bed using rolled towels Intake/Output from previous day: 02/13 0701 - 02/14 0700 In: 3530 [I.V.:2740; Blood:640; IV Piggyback:150] Out: 2590 [Urine:1650; Drains:290; Blood:50] Intake/Output this shift:     Recent Labs  12/28/15 0659  HGB 12.3*    Recent Labs  12/28/15 0659  WBC 9.7  RBC 3.96*  HCT 36.9*  PLT 126*   No results for input(s): NA, K, CL, CO2, BUN, CREATININE, GLUCOSE, CALCIUM in the last 72 hours. No results for input(s): LABPT, INR in the last 72 hours.  EXAM General - Patient is Alert, Appropriate and Oriented Extremity - Neurologically intact Neurovascular intact Sensation intact distally Intact pulses distally Dorsiflexion/Plantar flexion intact Dressing - dressing C/D/I Motor Function - intact, moving foot and toes well on exam. Able to do SLR on own  Past Medical History  Diagnosis Date  . Abdominal aneurysm (HCC)     3.5 cm  . Anxiety   . Neoplasm     benign, colon  . Chronic kidney disease   . Hypertension   . Depression   . Diverticulitis   . Rheumatic fever   . Sleep apnea   . Osteoarthritis   . Venous stasis   . 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(HCC)     Assessment/Plan: 1  Day Post-Op Procedure(s) (LRB): COMPUTER ASSISTED TOTAL KNEE ARTHROPLASTY (Right) Active Problems:   Right knee DJD   S/P total knee arthroplasty  Estimated body mass index is 33.65 kg/(m^2) as calculated from the following:   Height as of this encounter: 6\' 1"  (1.854 m).   Weight as of this encounter: 115.667 kg (255 lb). Advance diet Up with therapy D/C IV fluids Plan for discharge tomorrow Discharge home with home health  Labs: reviewed DVT Prophylaxis - Lovenox, Foot Pumps and TED hose Weight-Bearing as tolerated to right leg D/C O2 and Pulse OX and try on Room Air Labs  Alternate pain meds every 2 two. Begin working on a bowel movement  Jon R. [place] 12/28/2015, 7:17 AM

## 2015-12-28 NOTE — Anesthesia Postprocedure Evaluation (Signed)
Anesthesia Post Note  Patient: Alexander Howe  Procedure(s) Performed: Procedure(s) (LRB): COMPUTER ASSISTED TOTAL KNEE ARTHROPLASTY (Right)  Patient location during evaluation: Nursing Unit Anesthesia Type: Spinal Level of consciousness: awake and alert and oriented Pain management: pain level controlled Vital Signs Assessment: post-procedure vital signs reviewed and stable Respiratory status: spontaneous breathing, nonlabored ventilation and respiratory function stable Cardiovascular status: blood pressure returned to baseline and stable Postop Assessment: no headache, no backache, patient able to bend at knees and no signs of nausea or vomiting Anesthetic complications: no    Last Vitals:  Filed Vitals:   12/27/15 1922 12/28/15 0417  BP: 133/71 135/73  Pulse: 57 55  Temp: 36.4 C 36.7 C  Resp: 18 18    Last Pain:  Filed Vitals:   12/28/15 0606  PainSc: 2                  Johnna Acosta

## 2015-12-28 NOTE — Progress Notes (Signed)
Physical Therapy Treatment Patient Details Name: Alexander Howe MRN: [id number] DOB: [birth year] Today's Date: 12/28/2015    History of Present Illness Pt underwent R TKR without reported post-op complications. Pt is POD#0 at time of evaluation. Pt reports 1 fall in the last 12 months. Pt reports L TKR approximately 3 years ago    PT Comments    Attempted treatment after lunch; pt just returned to bed and wished to rest. Second attempt, pt eager for PT. Pt demonstrates great progress with ambulation; requiring cues initially for improved step lengths. Rolling walker adjusted for improved fit. Pt demonstrates improved sequence, fluidity, reciprocal pattern and R knee flexion with swing phase with continued ambulation. Pt negotiates up/down 4 steps with cues for sequence. Pt would benefit from continued work on steps, as pt has steps to enter as well as bed/bath on second floor at home. Continue to progress all strengthening and functional mobility to ensure optimal, safe return home.   Follow Up Recommendations  Home health PT     Equipment Recommendations  None recommended by PT    Recommendations for Other Services       Precautions / Restrictions Precautions Precautions: Knee;Fall Precaution Booklet Issued: Yes (comment) (per PT) Restrictions Weight Bearing Restrictions: Yes RLE Weight Bearing: Weight bearing as tolerated    Mobility  Bed Mobility Overal bed mobility: Modified Independent Bed Mobility: Supine to Sit     Supine to sit: Modified independent (Device/Increase time)     General bed mobility comments: Improved time/less effort; self assists RLE  Transfers Overall transfer level: Needs assistance Equipment used: Rolling walker (2 wheeled) Transfers: Sit to/from Stand Sit to Stand: Min guard         General transfer comment: cues for safe hand placement  Ambulation/Gait Ambulation/Gait assistance: Min guard Ambulation Distance (Feet): 280  Feet Assistive device: Rolling walker (2 wheeled) Gait Pattern/deviations: Step-through pattern;Decreased stance time - right;Decreased weight shift to right Gait velocity: Improving Gait velocity interpretation: <1.8 ft/sec, indicative of risk for recurrent falls General Gait Details: Improves with continued ambulation to a more fluid equal step length patter after cueing. demonstrating good R knee flexion with swing phase and good heel strike on R   Stairs Stairs: Yes Stairs assistance: Min guard Stair Management: Two rails;Step to pattern Number of Stairs: 4 General stair comments: cues for sequencing  Wheelchair Mobility    Modified Rankin (Stroke Patients Only)       Balance Overall balance assessment: Needs assistance         Standing balance support: Bilateral upper extremity supported Standing balance-Leahy Scale: Fair                      Cognition Arousal/Alertness: Awake/alert Behavior During Therapy: WFL for tasks assessed/performed Overall Cognitive Status: Within Functional Limits for tasks assessed                      Exercises Total Joint Exercises Ankle Circles/Pumps: AROM;Both;20 reps;Supine Quad Sets: Strengthening;Both;20 reps;Supine (also performed in stand 20x on right) Short Arc Quad: AROM;Right;20 reps;Supine Long Arc Quad: AROM;Right;20 reps;Seated Knee Flexion: AAROM;Right;Other reps (comment);Seated (5 minutes stretching seated edge of recliner) Goniometric ROM: -1-86 (AAROM)    General Comments        Pertinent Vitals/Pain Pain Assessment: 0-10 Pain Score: 2  Pain Location: R knee Pain Intervention(s): Monitored during session;Repositioned;Ice applied    Home Living Family/patient expects to be discharged to:: Private residence Living Arrangements: Spouse/significant other Available Help  at Discharge: Family Type of Home: House Home Access: Stairs to enter Entrance Stairs-Rails: Right;Left Home Layout:  Multi-level;Bed/bath upstairs Home Equipment: Environmental consultant - 2 wheels;Cane - single point      Prior Function Level of Independence: Independent      Comments: Full community ambulator without assistive device   PT Goals (current goals can now be found in the care plan section) Acute Rehab PT Goals Patient Stated Goal: Return home with improved function and less pain Progress towards PT goals: Progressing toward goals    Frequency  BID    PT Plan Current plan remains appropriate    Co-evaluation             End of Session Equipment Utilized During Treatment: Gait belt Activity Tolerance: Patient tolerated treatment well;No increased pain Patient left: in chair;with call bell/phone within reach;with family/visitor present (refused alarm; agreeable to call for assist)     Time: 1435-1458 PT Time Calculation (min) (ACUTE ONLY): 23 min  Charges:  $Gait Training: 23-37 mins $Therapeutic Exercise: 23-37 mins                    G Codes:      Alexander Howe 12/28/2015, 3:07 PM

## 2015-12-28 NOTE — Evaluation (Signed)
Occupational Therapy Evaluation Patient Details Name: Alexander Howe MRN: [id number] DOB: [birth year] Today's Date: 12/28/2015    History of Present Illness Pt underwent R TKR without reported post-op complications. Pt is POD#0 at time of evaluation. Pt reports 1 fall in the last 12 months. Pt reports L TKR approximately 3 years ago   Clinical Impression   This patient is a 70 year old male who came [facility] for a R total knee replacement.  Patient lives with his wife in a home and had been independent with ADL and functional mobility.  He now has pain, decreased mobility. Today he donned/doffed socks and donned pants with verbal cues and contact guard assist with out use of assistive devices (did use walker). No further Occupational Therapy needed at this time.       Follow Up Recommendations   (Home with home health Physical Therapy only, no further Occupational Therapy needed.)    Equipment Recommendations       Recommendations for Other Services       Precautions / Restrictions Precautions Precautions: Knee;Fall Precaution Booklet Issued: Yes (comment) (per PT) Restrictions Weight Bearing Restrictions: Yes RLE Weight Bearing: Weight bearing as tolerated      Mobility Bed Mobility  Transfers Overall transfer level: Needs assistance Equipment used: Rolling walker (2 wheeled) Transfers: Sit to/from Stand Sit to Stand: contact guard assist.             Balance Overall balance assessment: Needs assistance                                        ADL                                         General ADL Comments: Patient had been independent. Today patient doffed and donned his socks and donned pants with out use of assistive devices needing cues for safety and technique.     Vision     Perception     Praxis      Pertinent Vitals/Pain Pain Assessment: 0-10 Pain Score: 2  Pain Location: R  knee Pain Intervention(s): Monitored during session;Limited activity within patient's tolerance;Premedicated before session;Repositioned;Ice applied     Hand Dominance Right   Extremity/Trunk Assessment             Communication Communication Communication: No difficulties   Cognition Arousal/Alertness: Awake/alert Behavior During Therapy: WFL for tasks assessed/performed Overall Cognitive Status: Within Functional Limits for tasks assessed                     General Comments       Exercises Exercises: Total Joint     Shoulder Instructions      Home Living Family/patient expects to be discharged to:: Private residence Living Arrangements: Spouse/significant other Available Help at Discharge: Family Type of Home: House Home Access: Stairs to enter Technical brewer of Steps: 3 Entrance Stairs-Rails: Right;Left Home Layout: Multi-level;Bed/bath upstairs Alternate Level Stairs-Number of Steps: 13 Alternate Level Stairs-Rails: Left           Home Equipment: Walker - 2 wheels;Cane - single point          Prior Functioning/Environment Level of Independence: Independent        Comments: Full community ambulator without assistive  device    OT Diagnosis: Acute pain   OT Problem List:     OT Treatment/Interventions:      OT Goals(Current goals can be found in the care plan section) Acute Rehab OT Goals Patient Stated Goal: Return home with improved function and less pain OT Goal Formulation: With patient/family  OT Frequency:     Barriers to D/C:            Co-evaluation              End of Session Equipment Utilized During Treatment:  (Hip kit not needed)  Activity Tolerance:   Patient left: in chair;with family/visitor present   Time: 1438-8875 OT Time Calculation (min): 16 min Charges:  OT General Charges $OT Visit: 1 Procedure OT Evaluation $OT Eval Low Complexity: 1 Procedure G-Codes:    Myrene Galas,  MS/OTR/L  12/28/2015, 11:50 AM

## 2015-12-28 NOTE — Progress Notes (Signed)
Patient ID: Alexander Howe, male   DOB: 1946-07-25, 70 y.o.   MRN: [id number]  ORTHOPEDICS:  The patient is awake and alert this morning. Pain has been well controlled throughout the night. He denies any current nausea. He is able to perform an independent straight leg raise.  There was apparently an equipment malfunction with the Autovac last night during attempted reinfusion of the second unit. Autovac reinfusion was aborted and the drain converted to a Hemovac. Safety portal report was completed per nursing.  Afebrile. Vital signs are stable. Lungs: Clear to auscultation bilaterally. Cardiac: Regular rate and rhythm with normal S1 and S2. No appreciable murmurs, gallops, or rubs. Pedal pulses are palpable bilaterally. Right lower extremity: Dressing is intact and dry. Good quadriceps firing. The patient is able to perform independent straight leg raise. Homans test is negative. Neurovascular function is intact.  Labs reviewed. Metabolic B panel was not available this morning at the time of rounds.  Impression: Postop day #1: Right total knee arthroplasty  Plan: The patient is to continue postoperative physical therapy and occupational therapy. Today's goals have been reviewed with the patient. Labs to be repeated in the morning. Discharge planning is underway. Anticipate discharge to home with home physical therapy.  Deisi Salonga P. Holley Bouche M.D.

## 2015-12-28 NOTE — Progress Notes (Signed)
Clinical Social Worker (CSW) received SNF consult. PT is recommending home health. RN Case Manager is aware of above. Please reconsult if future social work needs arise. CSW signing off.   Lameisha Schuenemann Morgan, LCSW [phone] 

## 2015-12-29 ENCOUNTER — Inpatient Hospital Stay: Payer: Medicare PPO

## 2015-12-29 LAB — CBC
HEMATOCRIT: 35.4 % — AB (ref 40.0–52.0)
Hemoglobin: 12.1 g/dL — ABNORMAL LOW (ref 13.0–18.0)
MCH: 31.9 pg (ref 26.0–34.0)
MCHC: 34.1 g/dL (ref 32.0–36.0)
MCV: 93.5 fL (ref 80.0–100.0)
PLATELETS: 123 10*3/uL — AB (ref 150–440)
RBC: 3.79 MIL/uL — ABNORMAL LOW (ref 4.40–5.90)
RDW: 13.6 % (ref 11.5–14.5)
WBC: 10.1 10*3/uL (ref 3.8–10.6)

## 2015-12-29 LAB — CREATININE, SERUM
Creatinine, Ser: 1.12 mg/dL (ref 0.61–1.24)
GFR calc non Af Amer: >60 mL/min (ref >60)

## 2015-12-29 MED ORDER — OXYCODONE HCL 5 MG PO TABS: 5.0000 mg | ORAL_TABLET | ORAL | Status: DC | PRN

## 2015-12-29 MED ORDER — TRAMADOL HCL 50 MG PO TABS: 50.0000 mg | ORAL_TABLET | ORAL | Status: DC | PRN

## 2015-12-29 MED ORDER — ENOXAPARIN SODIUM 40 MG/0.4ML ~~LOC~~ SOLN: 40.0000 mg | SUBCUTANEOUS | Status: DC

## 2015-12-29 MED ORDER — LACTULOSE 10 GM/15ML PO SOLN
10.0000 g | Freq: Two times a day (BID) | ORAL | Status: DC | PRN
  Administered 2015-12-29 (×2): 10 g via ORAL
  Filled 2015-12-29 (×2): qty 30

## 2015-12-29 NOTE — Discharge Summary (Signed)
Physician Discharge Summary  Patient ID: Alexander Howe MRN: [id number] DOB/AGE: 1946-04-03 70 y.o.  Admit date: 12/27/2015 Discharge date: 12/29/2015  Admission Diagnoses:  CHRONIC PAIN RIGHT KNEE   Discharge Diagnoses: Patient Active Problem List   Diagnosis Date Noted  . 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 S/P total knee arthroplasty 12/27/2015    Past Medical History  Diagnosis Date  . Abdominal aneurysm (HCC)     3.5 cm  . Anxiety   . Neoplasm     benign, colon  . Chronic kidney disease   . Hypertension   . Depression   . Diverticulitis   . Rheumatic fever   . Sleep apnea   . Osteoarthritis   . Venous stasis   . 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[facility])      Transfusion: Autovac transfusions given the first 6 hours postoperatively   Consultants (if any):   case management for home health assistance   Discharged Condition: Improved  Hospital Course: Alexander Howe is an 70 y.o. male who was admitted 12/27/2015 with a diagnosis of degenerative arthrosis right knee and went to the operating room on 12/27/2015 and underwent the above named procedures.    Surgeries:Procedure(s): COMPUTER ASSISTED TOTAL KNEE ARTHROPLASTY on 12/27/2015  PRE-OPERATIVE DIAGNOSIS: Degenerative arthrosis of the right knee, primary  POST-OPERATIVE DIAGNOSIS: Same  PROCEDURE: Right total knee arthroplasty using computer-assisted navigation  SURGEON: Marciano Sequin. M.D.  ASSISTANT: Vance Peper, PA (present and scrubbed throughout the case, critical for assistance with exposure, retraction, instrumentation, and closure)  ANESTHESIA: spinal  ESTIMATED BLOOD LOSS: 50 mL  FLUIDS REPLACED: 2000 mL of crystalloid  TOURNIQUET TIME: 113 minutes  DRAINS: 2 medium drains to a reinfusion system  SOFT TISSUE RELEASES: Anterior cruciate ligament, posterior cruciate ligament, deep and superficial medial collateral ligament, patellofemoral ligament   IMPLANTS UTILIZED: DePuy PFC Sigma size 5 posterior  stabilized femoral component (cemented), size 6 MBT tibial component (cemented), 41 mm 3 peg oval dome patella (cemented), and a 10 mm stabilized rotating platform polyethylene insert.  INDICATIONS FOR SURGERY: Alexander Howe is a 70 y.o. year old male with a long history of progressive knee pain. X-rays demonstrated severe degenerative changes in tricompartmental fashion. The patient had not seen any significant improvement despite conservative nonsurgical intervention. After discussion of the risks and benefits of surgical intervention, the patient expressed understanding of the risks benefits and agree with plans for total knee arthroplasty.   The risks, benefits, and alternatives were discussed at length including but not limited to the risks of infection, bleeding, nerve injury, stiffness, blood clots, the need for revision surgery, cardiopulmonary complications, among others, and they were willing to proceed. Patient tolerated the surgery well. No complications .Patient was taken to PACU where she was stabilized and then transferred to the orthopedic floor.  Patient started on Lovenox 30 q 12 hrs. Foot pumps applied bilaterally at 80 mm hg. Heels elevated off bed with rolled towels. No evidence of DVT. Calves non tender. Negative Homan. Physical therapy started on day #1 for gait training and transfer with OT starting on  day #1 for ADL and assisted devices. Patient has done well with therapy. Ambulated greater than 280 feet upon being discharged. Was able to go up and down 4 steps independently and safely.  Patient's IV and Foley were discontinued on day #1 with the Hemovac and discharged on day #2 along with dressing change.     He was given perioperative antibiotics:  Anti-infectives    Start  Dose/Rate Route Frequency Ordered Stop   12/27/15 1400  ceFAZolin (ANCEF) IVPB 2 g/50 mL premix     2 g 100 mL/hr over 30 Minutes Intravenous Every 6 hours 12/27/15 1255 12/28/15 1020   12/27/15  0615  ceFAZolin (ANCEF) IVPB 2 g/50 mL premix  Status:  Discontinued     2 g 100 mL/hr over 30 Minutes Intravenous  Once 12/27/15 0611 12/27/15 1234   12/27/15 0604  ceFAZolin (ANCEF) 2-3 GM-% IVPB SOLR    Comments:  Veda Canning: cabinet override      12/27/15 0604 12/27/15 0755    .  He was fitted with AV 1 compression foot pump devices bilaterally, instructed on heel pumps, early ambulation, and TED stockings bilaterally for DVT prophylaxis.  He benefited maximally from the hospital stay and there were no complications.    Recent vital signs:  Filed Vitals:   12/28/15 1941 12/29/15 0323  BP: 142/73 151/68  Pulse: 58 67  Temp: 97.8 F (36.6 C) 98.1 F (36.7 C)  Resp: 18 18    Recent laboratory studies:  Lab Results  Component Value Date   HGB 12.1* 12/29/2015   HGB 12.3* 12/28/2015   HGB 15.2 12/14/2015   Lab Results  Component Value Date   WBC 10.1 12/29/2015   PLT 123* 12/29/2015   Lab Results  Component Value Date   INR 1.06 12/14/2015   Lab Results  Component Value Date   NA 134* 12/28/2015   K 4.0 12/28/2015   CL 100* 12/28/2015   CO2 32 12/28/2015   BUN 20 12/28/2015   CREATININE 1.12 12/29/2015   GLUCOSE 103* 12/28/2015    Discharge Medications:     Medication List    STOP taking these medications        aspirin EC 81 MG tablet      TAKE these medications        ALPRAZolam 0.5 MG tablet  Commonly known as:  XANAX  Take 0.5 mg by mouth 2 (two) times daily as needed for anxiety.     amLODipine 5 MG tablet  Commonly known as:  NORVASC  Take 5 mg by mouth daily.     buPROPion 150 MG 12 hr tablet  Commonly known as:  ZYBAN  Take 150 mg by mouth 2 (two) times daily.     docusate sodium 100 MG capsule  Commonly known as:  COLACE  Take 200 mg by mouth daily.     enoxaparin 40 MG/0.4ML injection  Commonly known as:  LOVENOX  Inject 0.4 mLs (40 mg total) into the skin daily.     FISH OIL CONCENTRATE PO  Take 1 capsule by mouth  daily.     furosemide 20 MG tablet  Commonly known as:  LASIX  Take 20 mg by mouth daily as needed.     hydrochlorothiazide 25 MG tablet  Commonly known as:  HYDRODIURIL  Take 25 mg by mouth daily.     magnesium oxide 400 MG tablet  Commonly known as:  MAG-OX  Take 400 mg by mouth daily.     metoprolol succinate 100 MG 24 hr tablet  Commonly known as:  TOPROL-XL  Take 100 mg by mouth at bedtime. Take with or immediately following a meal.     oxyCODONE 5 MG immediate release tablet  Commonly known as:  Oxy IR/ROXICODONE  Take 1-2 tablets (5-10 mg total) by mouth every 4 (four) hours as needed for severe pain or breakthrough pain.  pantoprazole 40 MG tablet  Commonly known as:  PROTONIX  Take 40 mg by mouth daily.     PARoxetine 10 MG tablet  Commonly known as:  PAXIL  Take 10 mg by mouth daily.     potassium chloride SA 20 MEQ tablet  Commonly known as:  K-DUR,KLOR-CON  Take 20 mEq by mouth daily.     quinapril 40 MG tablet  Commonly known as:  ACCUPRIL  Take 40 mg by mouth daily.     traMADol 50 MG tablet  Commonly known as:  ULTRAM  Take 1-2 tablets (50-100 mg total) by mouth every 4 (four) hours as needed for moderate pain.     Vitamin D3 2000 units Tabs  Take 1 tablet by mouth daily.        Diagnostic Studies: Dg Knee Right Port  12/27/2015  CLINICAL DATA:  Right total knee replacement. EXAM: PORTABLE RIGHT KNEE - 1-2 VIEW COMPARISON:  None. FINDINGS: Changes of right total knee replacement. Surgical drains in place. Soft tissue and joint space gas present. No hardware or bony complicating feature. IMPRESSION: Right knee replacement.  No complicating feature. Electronically Signed   By: Rolm Baptise M.D.   On: 12/27/2015 12:12    Disposition:       Discharge Instructions    Diet - low sodium heart healthy    Complete by:  As directed      Increase activity slowly    Complete by:  As directed            Follow-up Information    Follow up with  [facility] R[facility] PA On 01/11/2016.   Specialty:  Physician Assistant   Why:  at 9:15am   Contact information:   [address] [facility] [place] [address] [phone]       Follow up with Dereck Leep, MD On 02/08/2016.   Specialty:  Orthopedic Surgery   Why:  at 9:15am   Contact information:   [place] [address] [phone]        Signed: Watt Climes 12/29/2015, 7:36 AM

## 2015-12-29 NOTE — Progress Notes (Signed)
Physical Therapy Treatment Patient Details Name: Alexander Howe MRN: [id number] DOB: 08/17/1946 Today's Date: 12/29/2015    History of Present Illness Pt underwent R TKR without reported post-op complications. Pt is POD#0 at time of evaluation. Pt reports 1 fall in the last 12 months. Pt reports L TKR approximately 3 years ago    PT Comments    Pt continuing to do well; however, feeling a bit more sluggish today and sore. Right lower extremity swollen especially into calf. Pt notes some calf soreness; denies pain behind the knee and negative Homans sign. Belly of calf muscle sore to the touch. Pt educated on ice and elevation as well as calf stretching. Pt ambulates less today due to "woozy" feeling in head, so returned to room; ambulates 100 ft. Pt more stiff with ambulation today; however, does improve in fluidity and equal reciprocal pattern throughout walk. Pt does have discharge home today. Will check on pt status this afternoon to determine if pt requires another treatment session for continued range of motion, strength and functional mobility progression. Otherwise pt demonstrates adequate progress to return home safely with home health PT.   Follow Up Recommendations  Home health PT     Equipment Recommendations  None recommended by PT    Recommendations for Other Services       Precautions / Restrictions Precautions Precautions: Knee;Fall Restrictions Weight Bearing Restrictions: Yes RLE Weight Bearing: Weight bearing as tolerated    Mobility  Bed Mobility Overal bed mobility: Needs Assistance;Modified Independent Bed Mobility: Supine to Sit;Sit to Supine     Supine to sit: Modified independent (Device/Increase time) Sit to supine: Min assist (Min A for RLE today)   General bed mobility comments: continues to take increased time; required Min A for RLE to return to bed today  Transfers Overall transfer level: Needs assistance Equipment used: Rolling walker (2  wheeled) Transfers: Sit to/from Stand Sit to Stand: Supervision (cues for hands)         General transfer comment: Using RLE well  Ambulation/Gait Ambulation/Gait assistance: Min guard Ambulation Distance (Feet): 100 Feet Assistive device: Rolling walker (2 wheeled) Gait Pattern/deviations: Step-through pattern;Step-to pattern (decreeased knee flexion on R with swing phase today) Gait velocity: decreased Gait velocity interpretation: <1.8 ft/sec, indicative of risk for recurrent falls General Gait Details: A little more stiff today with ambulation, but continuing well. Initially partial step through, but improves with continued ambulation   Stairs            Wheelchair Mobility    Modified Rankin (Stroke Patients Only)       Balance Overall balance assessment: Needs assistance         Standing balance support: Bilateral upper extremity supported Standing balance-Leahy Scale: Fair                      Cognition Arousal/Alertness: Awake/alert Behavior During Therapy: WFL for tasks assessed/performed Overall Cognitive Status: Within Functional Limits for tasks assessed                      Exercises Total Joint Exercises Ankle Circles/Pumps: AROM;Both;20 reps;Supine Quad Sets: Strengthening;Both;20 reps;Supine (R knee with gravity assisted stretch) Straight Leg Raises: AROM;Strengthening;Right;20 reps;Standing (with quad set) Knee Flexion: AAROM;Right;Other reps (comment);Seated (12 minutes stretching) Goniometric ROM: -2-92 Marching in Standing: AROM;Right;20 reps;Standing Other Exercises Other Exercises: Pt education on ice and elevation; reviewed stretching/activity frequency, duration, technique and repetitions where applicable    General Comments  Pertinent Vitals/Pain Pain Assessment: 0-10 Pain Score: 4  Pain Location: R knee Pain Intervention(s): Monitored during session;Premedicated before session;Repositioned;Ice applied     Home Living                      Prior Function            PT Goals (current goals can now be found in the care plan section) Progress towards PT goals: Progressing toward goals    Frequency  BID    PT Plan Current plan remains appropriate    Co-evaluation             End of Session Equipment Utilized During Treatment: Gait belt Activity Tolerance: Patient tolerated treatment well;No increased pain (RLE swelling mildly limiting today) Patient left: in bed;with call bell/phone within reach;with bed alarm set;with SCD's reapplied (polar care in place)     Time: 0921-1007 PT Time Calculation (min) (ACUTE ONLY): 46 min  Charges:  $Gait Training: 8-22 mins $Therapeutic Exercise: 23-37 mins                    G Codes:      [name] 12/29/2015, 10:21 AM

## 2015-12-29 NOTE — Progress Notes (Addendum)
Dr Marry Guan on rounds. Reports if no bm then give soap suds enema. DISCHARGE SUSPENDED

## 2015-12-29 NOTE — Progress Notes (Signed)
   Subjective: 2 Days Post-Op Procedure(s) (LRB): COMPUTER ASSISTED TOTAL KNEE ARTHROPLASTY (Right) Patient reports pain as 3 on 0-10 scale.  However he appears to complain of pain that is greater than his scale 3. Patient is well, and has had no acute complaints or problems Continue with physical therapy today.  Plan is to go Home after hospital stay. no nausea and no vomiting this a.m. but was nauseated yesterday. Patient denies any chest pains or shortness of breath. Main complaint is morning is not sleeping well. Objective: Vital signs in last 24 hours: Temp:  [97.4 F (36.3 C)-98.4 F (36.9 C)] 98.1 F (36.7 C) (02/15 0323) Pulse Rate:  [58-67] 67 (02/15 0323) Resp:  [18] 18 (02/15 0323) BP: (141-152)/(65-75) 151/68 mmHg (02/15 0323) SpO2:  [94 %-99 %] 94 % (02/15 0323) well approximated incision Heels are non tender and elevated off the bed using rolled towels Intake/Output from previous day: 02/14 0701 - 02/15 0700 In: 840 [P.O.:840] Out: 1195 [Urine:860; Drains:335] Intake/Output this shift:     Recent Labs  12/28/15 0659 12/29/15 0544  HGB 12.3* 12.1*    Recent Labs  12/28/15 0659 12/29/15 0544  WBC 9.7 10.1  RBC 3.96* 3.79*  HCT 36.9* 35.4*  PLT 126* 123*    Recent Labs  12/28/15 0659 12/29/15 0544  NA 134*  --   K 4.0  --   CL 100*  --   CO2 32  --   BUN 20  --   CREATININE 1.28* 1.12  GLUCOSE 103*  --   CALCIUM 8.0*  --    No results for input(s): LABPT, INR in the last 72 hours.  EXAM General - Patient is Alert, Appropriate, Oriented and Seems to be a little slow on speaking and communicating this morning. Appears to be a little drowsy even though he is complaining of pain. Extremity - Neurologically intact Neurovascular intact Sensation intact distally Intact pulses distally Dorsiflexion/Plantar flexion intact Dressing - scant drainage Motor Function - intact, moving foot and toes well on exam.    Past Medical History  Diagnosis  Date  . Abdominal aneurysm (HCC)     3.5 cm  . Anxiety   . Neoplasm     benign, colon  . Chronic kidney disease   . Hypertension   . Depression   . Diverticulitis   . Rheumatic fever   . Sleep apnea   . Osteoarthritis   . Venous stasis   . 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(HCC)     Assessment/Plan: 2 Days Post-Op Procedure(s) (LRB): COMPUTER ASSISTED TOTAL KNEE ARTHROPLASTY (Right) Active Problems:   Right knee DJD   S/P total knee arthroplasty  Estimated body mass index is 33.65 kg/(m^2) as calculated from the following:   Height as of this encounter: 6\' 1"  (1.854 m).   Weight as of this encounter: 115.667 kg (255 lb). Up with therapy Discharge home with home health today after patient does physical therapy and has a bowel movement   Labs: Were reviewed DVT Prophylaxis - Lovenox, Foot Pumps and TED hose Weight-Bearing as tolerated to right leg Needs a bowel movement today Hemovac discontinued on today's visit We'll change dressing prior to being discharged and give the patient 2 extreme was a take home. Patient instructed on wearing of the TED stockings as well as Polar Care up one discharge  Brysin Towery R. [place] 12/29/2015, 7:26 AM

## 2015-12-29 NOTE — Progress Notes (Signed)
1000cc SSE GIVEN WITH EXCELLENT RESULTS. LARGE AMOUNT LOOSE AND FORMED BROWN STOOL

## 2015-12-29 NOTE — Discharge Instructions (Signed)

## 2015-12-29 NOTE — Care Management (Signed)
Patient discharging to home today followed by [place] home health. I have notified Arville Go and patient notified of Lovenox cost $16. Patient had not other questions. No other RNCM needs. Case closed.

## 2015-12-29 NOTE — Care Management Important Message (Signed)
Important Message  Patient Details  Name: Alexander Howe MRN: [id number] Date of Birth: 05/21/1946   Medicare Important Message Given:  Yes    Juliann Pulse A Nykole Matos 12/29/2015, 9:01 AM

## 2015-12-29 NOTE — Progress Notes (Signed)
Plan of care discussed with patient. Patient states that he would like to stay on top of pain and would notify nursing for pain medication Q2hours. Pain education giving to patient. Patient resting between care no acute distress noted. Attempted Lovenox teaching per patient spouse will be administering Lovenox.

## 2015-12-29 NOTE — Progress Notes (Signed)
PT NAUSEATED. ABDOMEN DISTENDED. FAINT BOWEL SOUNDS. DR HOOTEN NOTIFIED FOR KUB

## 2015-12-30 NOTE — Progress Notes (Signed)
PT [place]. WIFE INDEPENDENT IN LOVENOX,TED HOSE AND DRESSIGN CHG. I/S ON NEW MEDS . UNDERSTANDING VOICED. LEFT VIA W/C

## 2015-12-30 NOTE — Progress Notes (Signed)
Physical Therapy Treatment Patient Details Name: Alexander Howe MRN: [id number] DOB: 03-15-1946 Today's Date: 12/30/2015    History of Present Illness Pt underwent R TKR without reported post-op complications. Pt is POD#0 at time of evaluation. Pt reports 1 fall in the last 12 months. Pt reports L TKR approximately 3 years ago    PT Comments    Pt continuing to do well with strengthening and stretching exercises; understands techniques well. Demonstrates consistent gradual improvement in flexion and extension range of motion. Demonstrates safe transfer technique without cueing and progressing ambulation quality well. Pt comfortable with performing steps as demonstrated previously and does not wish to perform them currently. Pt ready for discharge; spouse arrives end of session and preparing pt to discharge home. Pt will continue with home health PT with progression to outpatient PT.  Follow Up Recommendations  Home health PT     Equipment Recommendations  None recommended by PT    Recommendations for Other Services       Precautions / Restrictions Precautions Precautions: Knee;Fall Restrictions Weight Bearing Restrictions: Yes RLE Weight Bearing: Weight bearing as tolerated    Mobility  Bed Mobility Overal bed mobility: Needs Assistance;Modified Independent Bed Mobility: Supine to Sit;Sit to Supine     Supine to sit: Modified independent (Device/Increase time) Sit to supine:  (Min A for RLE today)   General bed mobility comments: improved speed this morn  Transfers Overall transfer level: Needs assistance Equipment used: Rolling walker (2 wheeled)   Sit to Stand: Supervision         General transfer comment: Using RLE well  Ambulation/Gait Ambulation/Gait assistance: Supervision Ambulation Distance (Feet): 200 Feet Assistive device: Rolling walker (2 wheeled) Gait Pattern/deviations: Step-to pattern;Step-through pattern;Antalgic (improved to step through  pattern with continued ambulation) Gait velocity: decreased Gait velocity interpretation: Below normal speed for age/gender General Gait Details: Mildly guarded, but improves speed, fluidity and cadence with continued ambulation. Mildly antalgic with moderate lean BUEs    Stairs            Wheelchair Mobility    Modified Rankin (Stroke Patients Only)       Balance Overall balance assessment: Needs assistance         Standing balance support: Bilateral upper extremity supported Standing balance-Leahy Scale: Fair                      Cognition Arousal/Alertness: Awake/alert Behavior During Therapy: WFL for tasks assessed/performed Overall Cognitive Status: Within Functional Limits for tasks assessed                      Exercises Total Joint Exercises Ankle Circles/Pumps: AROM;Both;20 reps;Seated Quad Sets: Strengthening;Both;20 reps;Supine (R knee with gravity assisted stretch) Straight Leg Raises:  (with quad set) Long Arc Quad: AAROM;AROM;Right;20 reps;Seated (assist last 30 degrees) Knee Flexion: AAROM;Right;Other reps (comment);Seated Goniometric ROM: 0-92 degrees    General Comments        Pertinent Vitals/Pain Pain Assessment: 0-10 Pain Score: 3  Pain Location: R knee Pain Intervention(s): Monitored during session;Premedicated before session (ice held; spouse assisting pt to dress for discharge)    Home Living                      Prior Function            PT Goals (current goals can now be found in the care plan section) Progress towards PT goals: Progressing toward goals    Frequency  BID    PT Plan Current plan remains appropriate    Co-evaluation             End of Session Equipment Utilized During Treatment: Gait belt Activity Tolerance: Patient tolerated treatment well;No increased pain (RLE swelling mildly limiting today) Patient left: Other (comment) (seated edge of bed; spouse assisting dressing for  discharge)     Time: [id number] PT Time Calculation (min) (ACUTE ONLY): 25 min  Charges:  $Gait Training: 8-22 mins $Therapeutic Exercise: 8-22 mins                    G Codes:      [name] 12/30/2015, 10:10 AM

## 2015-12-30 NOTE — Progress Notes (Signed)
ORTHOPAEDICS PROGRESS NOTE  PATIENT NAME: Alexander Howe DOB: 18-Jun-1946  MRN: [id number]  POD # 3: Right total knee arthroplasty  Subjective: Large bowel movement late yesterday afternoon after soapsuds enemas. Patient is feeling much better today and rested well last night. No nausea.  Objective: Vital signs in last 24 hours: Temp:  [98 F (36.7 C)-98.8 F (37.1 C)] 98.2 F (36.8 C) (02/16 0403) Pulse Rate:  [60-77] 63 (02/16 0403) Resp:  [18-20] 20 (02/16 0403) BP: (129-152)/(70-80) 152/80 mmHg (02/16 0403) SpO2:  [93 %-99 %] 98 % (02/16 0403)  Intake/Output from previous day: 02/15 0701 - 02/16 0700 In: 240 [P.O.:240] Out: 850 [Urine:850]  EXAM General: Well-developed well-nourished male seen in no acute distress. Abdomen: Soft, nontender, nondistended. Bowel sounds are present. Right lower extremity: Dressing change this morning. Surgical incision is well approximated with staples. No erythema or ecchymosis. Mild amount of swelling. Good quadriceps firing. Homans test is negative. Neurologic: Awake, alert, and oriented. Sensory motor function are intact.  Assessment: Right total knee arthroplasty  Active Problems:   Right knee DJD   S/P total knee arthroplasty   Plan: Good results after soapsuds enema yesterday. Patient is now stable for discharge to home. Home physical therapy to be initiated. DVT Prophylaxis - Lovenox and TED hose  James P. Holley Bouche M.D.

## 2016-01-20 ENCOUNTER — Other Ambulatory Visit: Payer: Self-pay | Admitting: Internal Medicine

## 2016-01-20 DIAGNOSIS — R499 Unspecified voice and resonance disorder: Secondary | ICD-10-CM

## 2016-01-26 ENCOUNTER — Ambulatory Visit
Admission: RE | Admit: 2016-01-26 | Discharge: 2016-01-26 | Disposition: A | Discharge status: Home / Self Care | Payer: Medicare PPO | Source: Ambulatory Visit | Attending: Internal Medicine | Admitting: Internal Medicine

## 2016-01-26 DIAGNOSIS — R930 Abnormal findings on diagnostic imaging of skull and head, not elsewhere classified: Secondary | ICD-10-CM | POA: Diagnosis not present

## 2016-01-26 DIAGNOSIS — R499 Unspecified voice and resonance disorder: Secondary | ICD-10-CM | POA: Insufficient documentation

## 2016-01-26 DIAGNOSIS — G3189 Other specified degenerative diseases of nervous system: Secondary | ICD-10-CM | POA: Insufficient documentation

## 2016-03-26 IMAGING — MR MR KNEE*L* W/O CM
6 of 7 series · 33 of 40 positions shown · non-contrast
Comparison: Radiographs 10/16/2012

ADDENDUM:
The patient returned for additional imaging to specifically evaluate
an abnormality noted on outside x-rays in the mid distal femur.
Extra cortical ossification involving the mid distal left femur
anteriorly and slightly laterally. This does not communicate with
the medullary space and is most likely the result of a prior
periosteal hematoma which has ossified. Also, there is a history of
remote trauma as a child requiring surgery in this area and this
could be postoperative in nature. No worrisome MR imaging features.
CLINICAL DATA: Knee pain and swelling for 2 months. History of
prior total knee arthroplasty.

EXAM:
MRI OF THE LEFT KNEE WITHOUT CONTRAST
TECHNIQUE: Multiplanar, multisequence MR imaging of the knee was performed. No
intravenous contrast was administered.

[Series 5: T2 fat-sat · axial · 4.0mm · 0.66mm/px · z∈[-108,+68]mm · 5 of 41 slices shown]
[im 1/41]
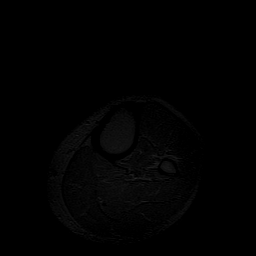
[im 11/41]
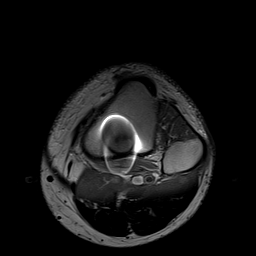
[im 21/41]
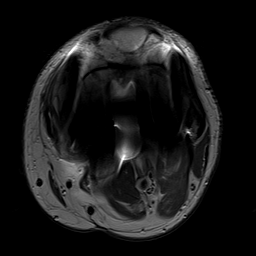
[im 31/41]
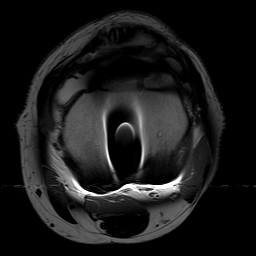
[im 41/41]
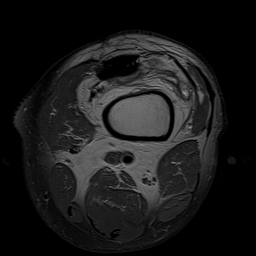

[Series 7: T1 · axial · 4.0mm · 0.66mm/px · z∈[-108,+68]mm · 5 of 41 slices shown (1 of 2)]
[im 1/41]
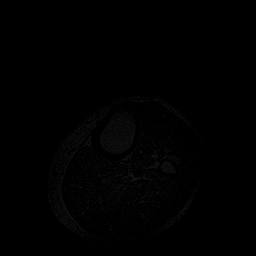
[im 11/41]
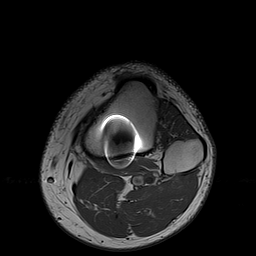
[im 21/41]
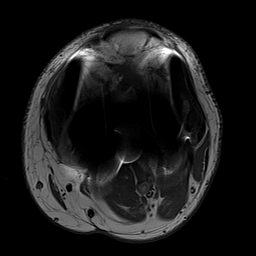
[im 31/41]
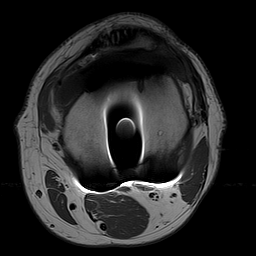
[im 41/41]
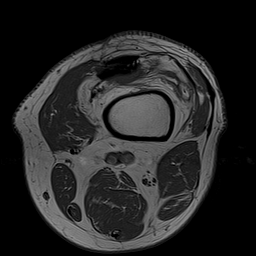

[Series 8: T1 · sagittal · 4.0mm · 0.53mm/px · 6 of 37 slices shown (2 of 2)]
[im 1/37]
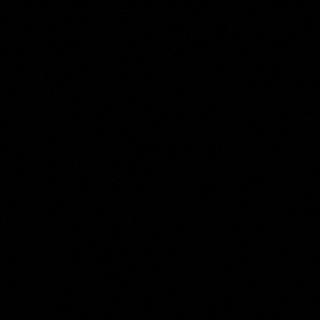
[im 8/37]
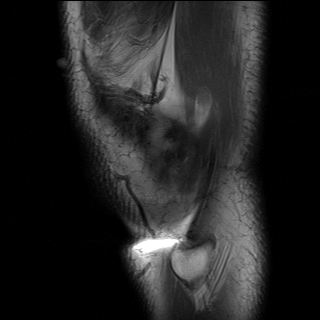
[im 15/37]
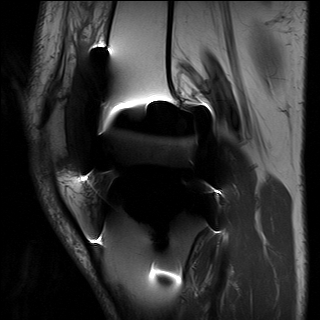
[im 22/37]
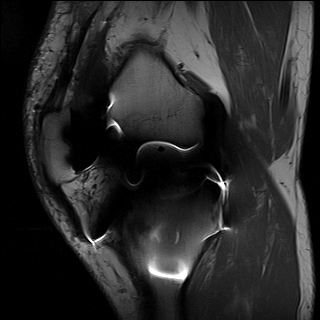
[im 29/37]
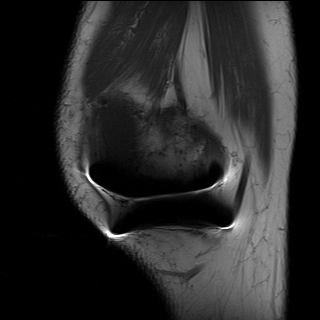
[im 37/37]
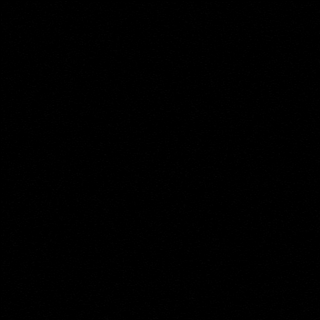

[Series 9: STIR · sagittal · 4.0mm · 0.39mm/px · 6 of 37 slices shown (1 of 2)]
[im 1/37]
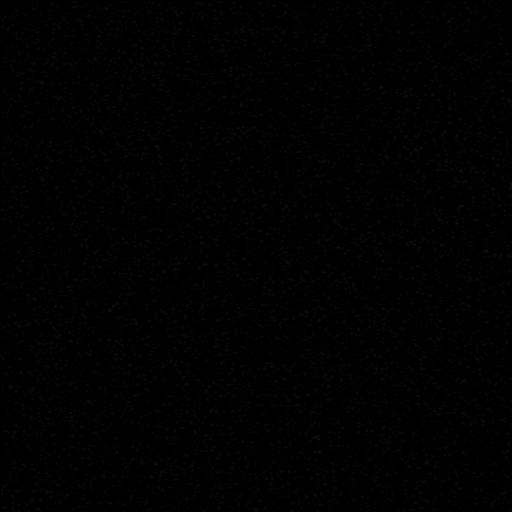
[im 8/37]
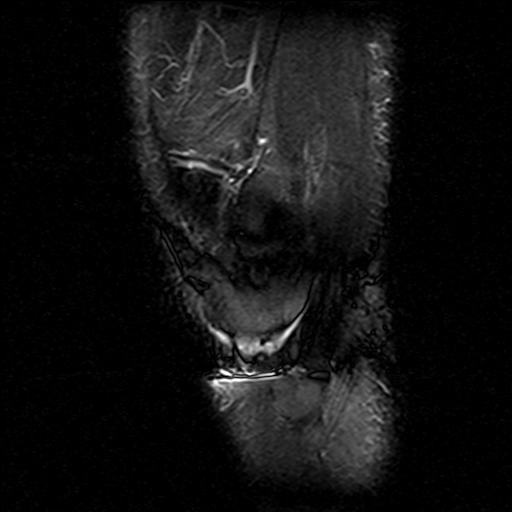
[im 15/37]
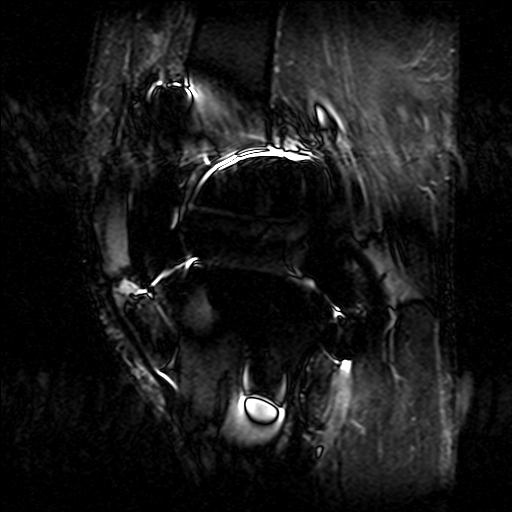
[im 22/37]
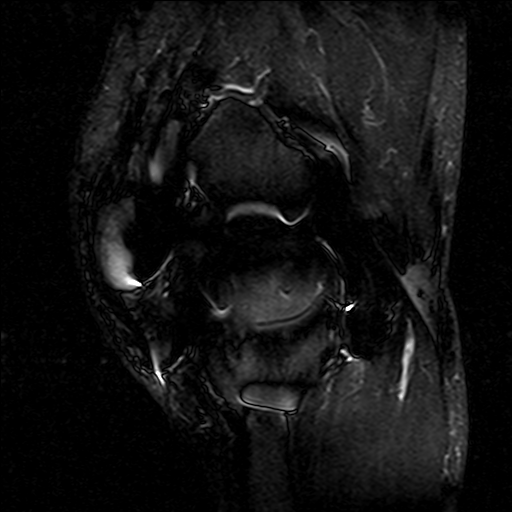
[im 29/37]
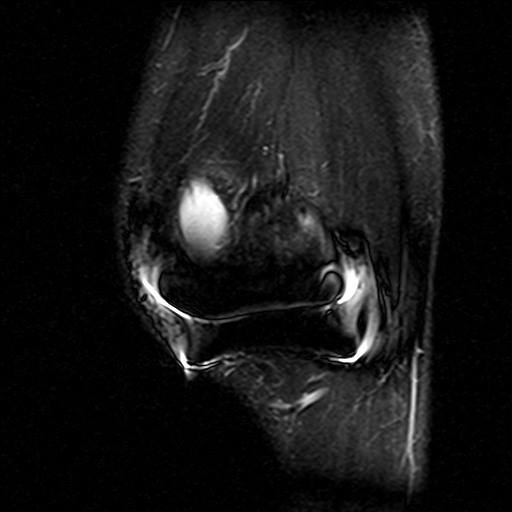
[im 37/37]
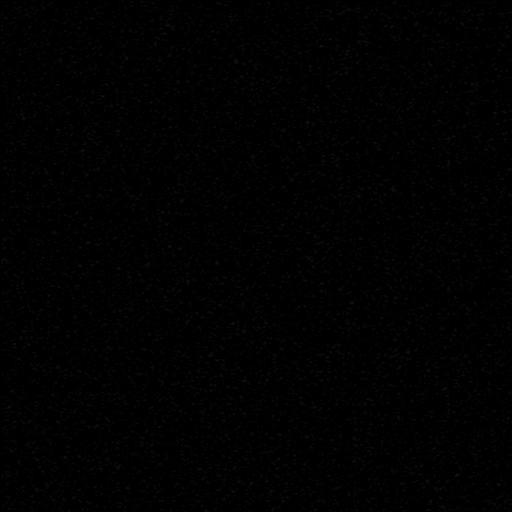

[Series 10: T2 · sagittal · 4.0mm · 0.66mm/px · 6 of 37 slices shown]
[im 1/37]
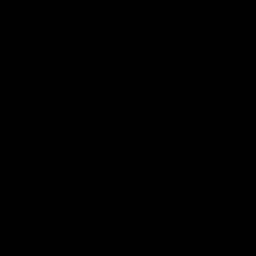
[im 8/37]
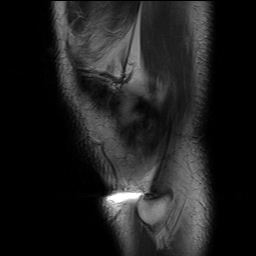
[im 15/37]
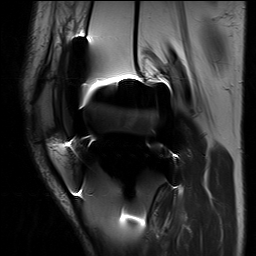
[im 22/37]
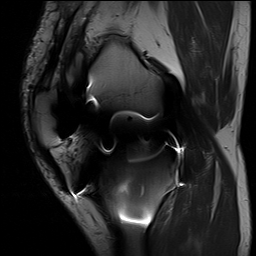
[im 29/37]
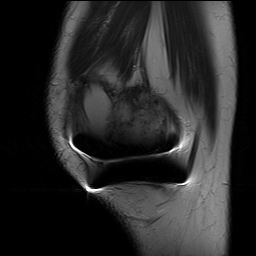
[im 37/37]
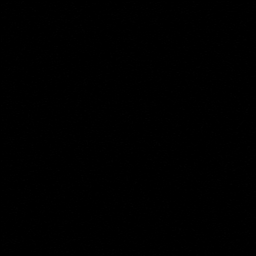

[Series 11: STIR · coronal · 4.0mm · 0.39mm/px · 5 of 37 slices shown (2 of 2)]
[im 1/37]
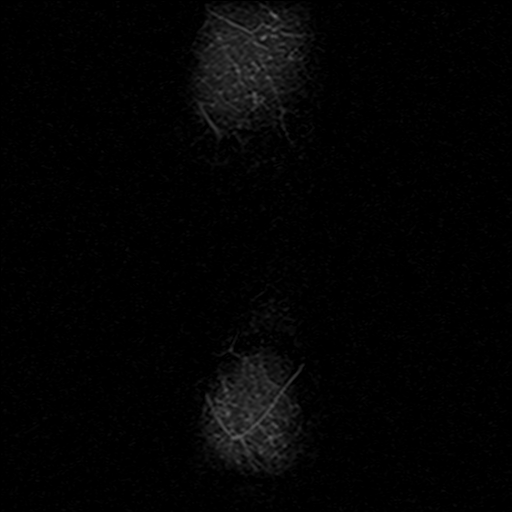
[im 8/37]
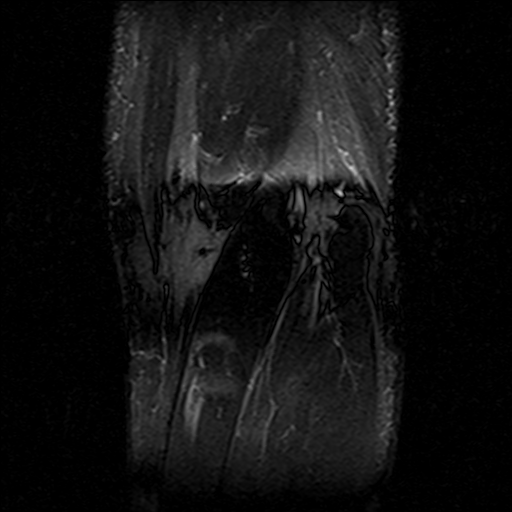
[im 15/37]
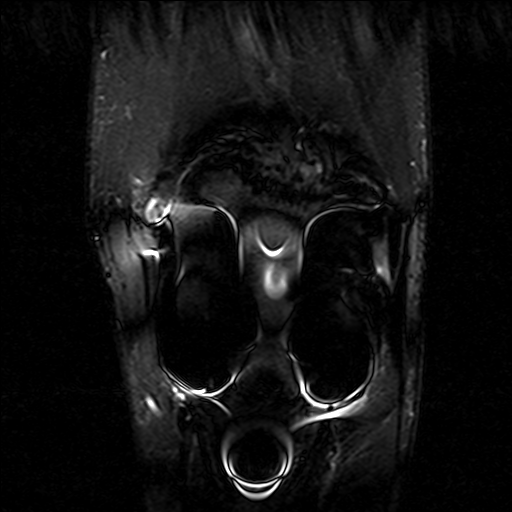
[im 22/37]
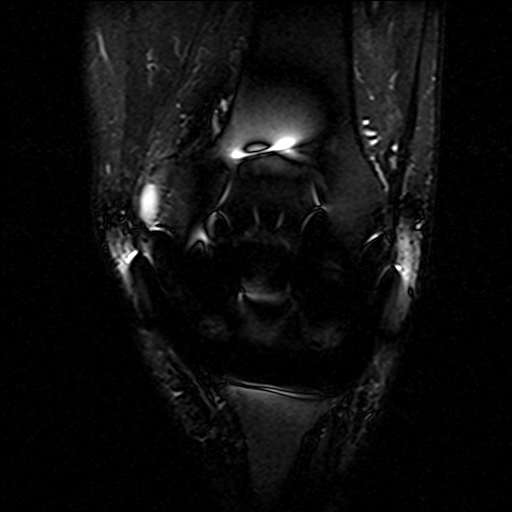
[im 29/37]
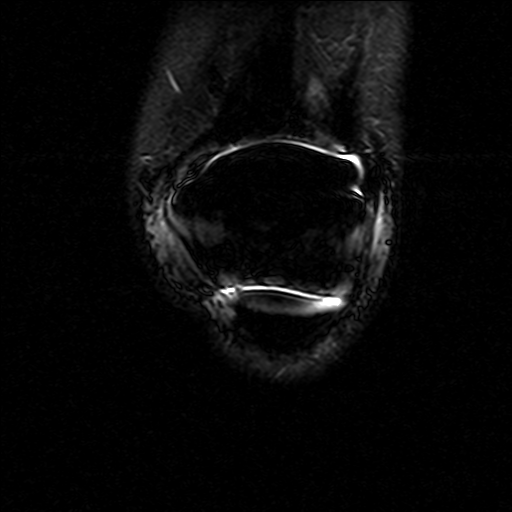

[33 of 40 positions shown; findings below may reference images not displayed]

FINDINGS: Significant artifact related to the total knee arthroplasty. I do
not see any obvious complicating features associated with the
hardware. No obvious marrow edema or stress fracture.

There is a moderate-sized joint effusion.

The quadriceps and patellar tendons are intact. No findings for
prepatellar bursitis.

The surrounding knee musculature appears grossly normal.
IMPRESSION: Very limited examination due to significant artifact from the
prosthesis. No definite complicating features such as loosening or
stress fracture.

Moderate-sized joint effusion.

## 2016-07-31 ENCOUNTER — Other Ambulatory Visit: Payer: Self-pay | Admitting: Internal Medicine

## 2016-07-31 DIAGNOSIS — I714 Abdominal aortic aneurysm, without rupture, unspecified: Secondary | ICD-10-CM

## 2016-08-04 ENCOUNTER — Ambulatory Visit
Admission: RE | Admit: 2016-08-04 | Discharge: 2016-08-04 | Disposition: A | Discharge status: Home / Self Care | Payer: Medicare PPO | Source: Ambulatory Visit | Attending: Internal Medicine | Admitting: Internal Medicine

## 2016-08-04 DIAGNOSIS — N281 Cyst of kidney, acquired: Secondary | ICD-10-CM | POA: Diagnosis not present

## 2016-08-04 DIAGNOSIS — I714 Abdominal aortic aneurysm, without rupture, unspecified: Secondary | ICD-10-CM

## 2018-04-04 ENCOUNTER — Encounter: Payer: Self-pay | Admitting: *Deleted

## 2018-04-05 ENCOUNTER — Ambulatory Visit: Payer: Medicare Other | Admitting: Anesthesiology

## 2018-04-05 ENCOUNTER — Encounter
Admission: RE | Disposition: A | Discharge status: Home / Self Care | Payer: Self-pay | Source: Ambulatory Visit | Attending: Unknown Physician Specialty

## 2018-04-05 ENCOUNTER — Ambulatory Visit
Admission: RE | Admit: 2018-04-05 | Discharge: 2018-04-05 | Disposition: A | Discharge status: Home / Self Care | Payer: Medicare Other | Source: Ambulatory Visit | Attending: Unknown Physician Specialty | Admitting: Unknown Physician Specialty

## 2018-04-05 ENCOUNTER — Encounter: Payer: Self-pay | Admitting: *Deleted

## 2018-04-05 DIAGNOSIS — K621 Rectal polyp: Secondary | ICD-10-CM | POA: Insufficient documentation

## 2018-04-05 DIAGNOSIS — Z79891 Long term (current) use of opiate analgesic: Secondary | ICD-10-CM | POA: Diagnosis not present

## 2018-04-05 DIAGNOSIS — I120 Hypertensive chronic kidney disease with stage 5 chronic kidney disease or end stage renal disease: Secondary | ICD-10-CM | POA: Diagnosis not present

## 2018-04-05 DIAGNOSIS — D125 Benign neoplasm of sigmoid colon: Secondary | ICD-10-CM | POA: Diagnosis not present

## 2018-04-05 DIAGNOSIS — N185 Chronic kidney disease, stage 5: Secondary | ICD-10-CM | POA: Diagnosis not present

## 2018-04-05 DIAGNOSIS — Z79899 Other long term (current) drug therapy: Secondary | ICD-10-CM | POA: Insufficient documentation

## 2018-04-05 DIAGNOSIS — I714 Abdominal aortic aneurysm, without rupture: Secondary | ICD-10-CM | POA: Diagnosis not present

## 2018-04-05 DIAGNOSIS — J449 Chronic obstructive pulmonary disease, unspecified: Secondary | ICD-10-CM | POA: Diagnosis not present

## 2018-04-05 DIAGNOSIS — I739 Peripheral vascular disease, unspecified: Secondary | ICD-10-CM | POA: Diagnosis not present

## 2018-04-05 DIAGNOSIS — K219 Gastro-esophageal reflux disease without esophagitis: Secondary | ICD-10-CM | POA: Diagnosis not present

## 2018-04-05 DIAGNOSIS — G4730 Sleep apnea, unspecified: Secondary | ICD-10-CM | POA: Insufficient documentation

## 2018-04-05 DIAGNOSIS — F329 Major depressive disorder, single episode, unspecified: Secondary | ICD-10-CM | POA: Diagnosis not present

## 2018-04-05 DIAGNOSIS — F419 Anxiety disorder, unspecified: Secondary | ICD-10-CM | POA: Diagnosis not present

## 2018-04-05 DIAGNOSIS — Z87891 Personal history of nicotine dependence: Secondary | ICD-10-CM | POA: Insufficient documentation

## 2018-04-05 DIAGNOSIS — D123 Benign neoplasm of transverse colon: Secondary | ICD-10-CM | POA: Insufficient documentation

## 2018-04-05 DIAGNOSIS — Z7982 Long term (current) use of aspirin: Secondary | ICD-10-CM | POA: Insufficient documentation

## 2018-04-05 DIAGNOSIS — Z86010 Personal history of colonic polyps: Secondary | ICD-10-CM | POA: Diagnosis present

## 2018-04-05 HISTORY — DX: Diverticulosis of intestine, part unspecified, without perforation or abscess without bleeding: K57.90

## 2018-04-05 HISTORY — PX: COLONOSCOPY WITH PROPOFOL: SHX5780

## 2018-04-05 HISTORY — DX: Abdominal aortic aneurysm, without rupture, unspecified: I71.40

## 2018-04-05 HISTORY — DX: Benign lipomatous neoplasm, unspecified: D17.9

## 2018-04-05 HISTORY — DX: Tinea unguium: B35.1

## 2018-04-05 HISTORY — DX: Hyperlipidemia, unspecified: E78.5

## 2018-04-05 HISTORY — DX: Abdominal aortic aneurysm, without rupture: I71.4

## 2018-04-05 HISTORY — DX: Deficiency of other specified B group vitamins: E53.8

## 2018-04-05 HISTORY — DX: Polyp of colon: K63.5

## 2018-04-05 SURGERY — COLONOSCOPY WITH PROPOFOL
Anesthesia: General

## 2018-04-05 MED ORDER — MIDAZOLAM HCL 2 MG/2ML IJ SOLN
INTRAMUSCULAR | Status: AC
  Filled 2018-04-05: qty 2

## 2018-04-05 MED ORDER — PROPOFOL 10 MG/ML IV BOLUS
INTRAVENOUS | Status: DC | PRN
  Administered 2018-04-05: 90 mg via INTRAVENOUS

## 2018-04-05 MED ORDER — SODIUM CHLORIDE 0.9 % IV SOLN: INTRAVENOUS | Status: DC

## 2018-04-05 MED ORDER — PIPERACILLIN-TAZOBACTAM 3.375 G IVPB
INTRAVENOUS | Status: AC
  Filled 2018-04-05: qty 50

## 2018-04-05 MED ORDER — MIDAZOLAM HCL 2 MG/2ML IJ SOLN
INTRAMUSCULAR | Status: DC | PRN
  Administered 2018-04-05: 2 mg via INTRAVENOUS

## 2018-04-05 MED ORDER — SODIUM CHLORIDE 0.9 % IV SOLN
INTRAVENOUS | Status: DC
  Administered 2018-04-05: 1000 mL via INTRAVENOUS

## 2018-04-05 MED ORDER — PIPERACILLIN-TAZOBACTAM 3.375 G IVPB 30 MIN
3.3750 g | Freq: Once | INTRAVENOUS | Status: DC
  Filled 2018-04-05: qty 50

## 2018-04-05 MED ORDER — PROPOFOL 500 MG/50ML IV EMUL
INTRAVENOUS | Status: DC | PRN
  Administered 2018-04-05: 120 ug/kg/min via INTRAVENOUS

## 2018-04-05 NOTE — Transfer of Care (Signed)
Immediate Anesthesia Transfer of Care Note  Patient: Alexander Howe  Procedure(s) Performed: COLONOSCOPY WITH PROPOFOL (N/A )  Patient Location: Endoscopy Unit  Anesthesia Type:General  Level of Consciousness: sedated and responds to stimulation  Airway & Oxygen Therapy: Patient Spontanous Breathing and Patient connected to nasal cannula oxygen  Post-op Assessment: Report given to RN and Post -op Vital signs reviewed and stable  Post vital signs: Reviewed and stable  Last Vitals:  Vitals Value Taken Time  BP 121/70 04/05/2018  1:27 PM  Temp 36.1 C 04/05/2018  1:27 PM  Pulse 51 04/05/2018  1:28 PM  Resp 16 04/05/2018  1:28 PM  SpO2 97 % 04/05/2018  1:28 PM  Vitals shown include unvalidated device data.  Last Pain:  Vitals:   04/05/18 1327  TempSrc: Tympanic         Complications: No apparent anesthesia complications

## 2018-04-05 NOTE — Anesthesia Preprocedure Evaluation (Signed)
Anesthesia Evaluation  Patient identified by MRN, date of birth, ID band Patient awake    Reviewed: Allergy & Precautions, NPO status , Patient's Chart, lab work & pertinent test results, reviewed documented beta blocker date and time   History of Anesthesia Complications Negative for: history of anesthetic complications  Airway Mallampati: II       Dental  (+) Implants   Pulmonary sleep apnea and Continuous Positive Airway Pressure Ventilation , neg COPD, former smoker,           Cardiovascular hypertension, Pt. on medications and Pt. on home beta blockers + Peripheral Vascular Disease  (-) Past MI and (-) CHF (-) dysrhythmias (-) Valvular Problems/Murmurs     Neuro/Psych neg Seizures Anxiety Depression    GI/Hepatic Neg liver ROS, GERD  Medicated and Controlled,  Endo/Other  neg diabetes  Renal/GU Renal InsufficiencyRenal disease     Musculoskeletal   Abdominal   Peds  Hematology   Anesthesia Other Findings   Reproductive/Obstetrics                             Anesthesia Physical Anesthesia Plan  ASA: III  Anesthesia Plan: General   Post-op Pain Management:    Induction: Intravenous  PONV Risk Score and Plan: 2 and TIVA and Propofol infusion  Airway Management Planned: Nasal Cannula  Additional Equipment:   Intra-op Plan:   Post-operative Plan:   Informed Consent: I have reviewed the patients History and Physical, chart, labs and discussed the procedure including the risks, benefits and alternatives for the proposed anesthesia with the patient or authorized representative who has indicated his/her understanding and acceptance.     Plan Discussed with:   Anesthesia Plan Comments:         Anesthesia Quick Evaluation

## 2018-04-05 NOTE — Op Note (Addendum)
[facility] Gastroenterology Patient Name: Alexander Howe Procedure Date: 04/05/2018 12:42 PM MRN: [id number] Account #: [id number] Date of Birth: August 25, 1946 Admit Type: Outpatient Age: 72 Room: [facility] ENDO ROOM 4 Gender: Male Note Status: Finalized Procedure:            Colonoscopy Indications:          High risk colon cancer surveillance: Personal history                        of colonic polyps Providers:            Manya Silvas, MD Referring MD:         Ramonita Lab, MD (Referring MD) Medicines:            Propofol per Anesthesia Complications:        No immediate complications. Procedure:            Pre-Anesthesia Assessment:                       - After reviewing the risks and benefits, the patient                        was deemed in satisfactory condition to undergo the                        procedure.                       After obtaining informed consent, the colonoscope was                        passed under direct vision. Throughout the procedure,                        the patient's blood pressure, pulse, and oxygen                        saturations were monitored continuously. The                        Colonoscope was introduced through the anus and                        advanced to the the cecum, identified by appendiceal                        orifice and ileocecal valve. The colonoscopy was                        performed without difficulty. The patient tolerated the                        procedure well. The quality of the bowel preparation                        was good. Findings:      A diminutive polyp was found in the transverse colon. The polyp was       sessile. The polyp was removed with a jumbo cold forceps. Resection and       retrieval were complete.      Two sessile polyps were found  in the sigmoid colon. The polyps were       diminutive in size. These polyps were removed with a jumbo cold forceps.       Resection and  retrieval were complete.      A small polyp was found in the sigmoid colon. The polyp was sessile. The       polyp was removed with a hot snare. Resection and retrieval were       complete.      A diminutive polyp was found in the rectum. The polyp was sessile. The       polyp was removed with a cold snare. Resection and retrieval were       complete. Impression:           - One diminutive polyp in the transverse colon, removed                        with a jumbo cold forceps. Resected and retrieved.                       - Two diminutive polyps in the sigmoid colon, removed                        with a jumbo cold forceps. Resected and retrieved.                       - One small polyp in the sigmoid colon, removed with a                        hot snare. Resected and retrieved.                       - One diminutive polyp in the rectum, removed with a                        cold snare. Resected and retrieved. Biopsied. Recommendation:       - Await pathology results. Manya Silvas, MD 04/05/2018 1:26:18 PM This report has been signed electronically. Number of Addenda: 0 Note Initiated On: 04/05/2018 12:42 PM Scope Withdrawal Time: 0 hours 19 minutes 32 seconds  Total Procedure Duration: 0 hours 24 minutes 1 second       [facility]

## 2018-04-05 NOTE — H&P (Signed)
Primary Care Physician:  Adin Hector, MD Primary Gastroenterologist:  Dr. Vira Agar  Pre-Procedure History & Physical: HPI:  Alexander Howe is a 72 y.o. male is here for an colonoscopy.  Personal history of colon polyps.   Past Medical History:  Diagnosis Date  . AAA (abdominal aortic aneurysm) without rupture ([place])   . Abdominal aneurysm (HCC)    3.5 cm  . Anxiety   . Chronic kidney disease    stage 3 / kidney stones  . Colon polyp   . Depression   . Diverticulitis   . Diverticulosis   . Hyperlipemia   . Hypertension   . Lipoma   . Neoplasm    benign, colon  . Onychomycosis   . Osteoarthritis   . Osteoarthritis   . Rheumatic fever   . Rheumatic fever   . Sleep apnea   . 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([place])   . Venous stasis   . Venous stasis   . Vitamin B 12 deficiency     Past Surgical History:  Procedure Laterality Date  . COLONOSCOPY W/ POLYPECTOMY    . COLOSTOMY    . COLOSTOMY REVERSAL    . CYST REMOVAL LEG    . KNEE ARTHROPLASTY Right 12/27/2015   Procedure: COMPUTER ASSISTED TOTAL KNEE ARTHROPLASTY;  Surgeon: Dereck Leep, MD;  Location: [facility] ORS;  Service: Orthopedics;  Laterality: Right;  . KNEE ARTHROSCOPY Left   . LIPOMA EXCISION     right thigh  . LITHOTRIPSY    . TOTAL KNEE ARTHROPLASTY Left 10/16/2012    Prior to Admission medications   Medication Sig Start Date End Date Taking? Authorizing Provider  amoxicillin (AMOXIL) 500 MG capsule Take 500 mg by mouth 3 (three) times daily.   Yes [provider]  aspirin EC 81 MG tablet Take 81 mg by mouth daily.   Yes [provider]  atorvastatin (LIPITOR) 80 MG tablet Take 80 mg by mouth daily.   Yes [provider]  ALPRAZolam Duanne Moron) 0.5 MG tablet Take 0.5 mg by mouth 2 (two) times daily as needed for anxiety.    [provider]  amLODipine (NORVASC) 5 MG tablet Take 5 mg by mouth daily.    [provider]  buPROPion (ZYBAN) 150 MG 12 hr tablet Take 150 mg by  mouth 2 (two) times daily.    [provider]  Cholecalciferol (VITAMIN D3) 2000 units TABS Take 1 tablet by mouth daily.    [provider]  docusate sodium (COLACE) 100 MG capsule Take 200 mg by mouth daily.    [provider]  enoxaparin (LOVENOX) 40 MG/0.4ML injection Inject 0.4 mLs (40 mg total) into the skin daily. 12/29/15   Watt Climes, PA  furosemide (LASIX) 20 MG tablet Take 20 mg by mouth daily as needed.    [provider]  hydrochlorothiazide (HYDRODIURIL) 25 MG tablet Take 25 mg by mouth daily.    [provider]  magnesium oxide (MAG-OX) 400 MG tablet Take 400 mg by mouth daily.    [provider]  metoprolol succinate (TOPROL-XL) 100 MG 24 hr tablet Take 100 mg by mouth at bedtime. Take with or immediately following a meal.    [provider]  Omega-3 Fatty Acids (FISH OIL CONCENTRATE PO) Take 1 capsule by mouth daily.    [provider]  oxyCODONE (OXY IR/ROXICODONE) 5 MG immediate release tablet Take 1-2 tablets (5-10 mg total) by mouth every 4 (four) hours as needed for  severe pain or breakthrough pain. 12/29/15   Watt Climes, PA  pantoprazole (PROTONIX) 40 MG tablet Take 40 mg by mouth daily.    [provider]  PARoxetine (PAXIL) 10 MG tablet Take 10 mg by mouth daily.    [provider]  potassium chloride SA (K-DUR,KLOR-CON) 20 MEQ tablet Take 20 mEq by mouth daily.    [provider]  quinapril (ACCUPRIL) 40 MG tablet Take 40 mg by mouth daily.    [provider]  traMADol (ULTRAM) 50 MG tablet Take 1-2 tablets (50-100 mg total) by mouth every 4 (four) hours as needed for moderate pain. 12/29/15   Watt Climes, PA    Allergies as of 04/03/2018  . (No Known Allergies)    History reviewed. No pertinent family history.  Social History   Socioeconomic History  . Marital status: Married    Spouse name: Not on file  . Number of children: Not on file  . Years of  education: Not on file  . Highest education level: Not on file  Occupational History  . Not on file  Social Needs  . Financial resource strain: Not on file  . Food insecurity:    Worry: Not on file    Inability: Not on file  . Transportation needs:    Medical: Not on file    Non-medical: Not on file  Tobacco Use  . Smoking status: Former Research scientist (life sciences)  . Smokeless tobacco: Former Systems developer    Types: [place] date: 11/29/1990  Substance and Sexual Activity  . Alcohol use: Yes    Comment: rare  . Drug use: Never  . Sexual activity: Yes  Lifestyle  . Physical activity:    Days per week: Not on file    Minutes per session: Not on file  . Stress: Not on file  Relationships  . Social connections:    Talks on phone: Not on file    Gets together: Not on file    Attends religious service: Not on file    Active member of club or organization: Not on file    Attends meetings of clubs or organizations: Not on file    Relationship status: Not on file  . Intimate partner violence:    Fear of current or ex partner: Not on file    Emotionally abused: Not on file    Physically abused: Not on file    Forced sexual activity: Not on file  Other Topics Concern  . Not on file  Social History Narrative  . Not on file    Review of Systems: See HPI, otherwise negative ROS  Physical Exam: BP (!) 147/92   Pulse (!) 55   Temp 98 F (36.7 C) (Tympanic)   Resp 16   Ht 6\' 2"  (1.88 m)   Wt 117.9 kg (260 lb)   SpO2 100%   BMI 33.38 kg/m  General:   Alert,  pleasant and cooperative in NAD Head:  Normocephalic and atraumatic. Neck:  Supple; no masses or thyromegaly. Lungs:  Clear throughout to auscultation.    Heart:  Regular rate and rhythm. Abdomen:  Soft, nontender and nondistended. Normal bowel sounds, without guarding, and without rebound.   Neurologic:  Alert and  oriented x4;  grossly normal neurologically.  Impression/Plan: Alexander Howe is here for an colonoscopy to be performed  for personal history of colon polyps.  Risks, benefits, limitations, and alternatives regarding  colonoscopy have been reviewed with the patient.  Questions have been answered.  All parties agreeable.   Gaylyn Cheers, MD  04/05/2018, 12:46 PM

## 2018-04-05 NOTE — Anesthesia Postprocedure Evaluation (Signed)
Anesthesia Post Note  Patient: Alexander Howe  Procedure(s) Performed: COLONOSCOPY WITH PROPOFOL (N/A )  Patient location during evaluation: Endoscopy Anesthesia Type: General Level of consciousness: awake and alert Pain management: pain level controlled Vital Signs Assessment: post-procedure vital signs reviewed and stable Respiratory status: spontaneous breathing and respiratory function stable Cardiovascular status: stable Anesthetic complications: no     Last Vitals:  Vitals:   04/05/18 1357 04/05/18 1405  BP: 126/75 129/74  Pulse: (!) 48 (!) 44  Resp: 14 15  Temp:    SpO2: 100% 99%    Last Pain:  Vitals:   04/05/18 1327  TempSrc: Tympanic                 KEPHART,WILLIAM K

## 2018-04-05 NOTE — Anesthesia Post-op Follow-up Note (Signed)
Anesthesia QCDR form completed.        

## 2018-04-09 ENCOUNTER — Encounter: Payer: Self-pay | Admitting: Unknown Physician Specialty

## 2018-04-09 LAB — SURGICAL PATHOLOGY

## 2018-05-08 DIAGNOSIS — C439 Malignant melanoma of skin, unspecified: Secondary | ICD-10-CM

## 2018-05-08 HISTORY — DX: Malignant melanoma of skin, unspecified: C43.9

## 2018-09-18 DIAGNOSIS — C4491 Basal cell carcinoma of skin, unspecified: Secondary | ICD-10-CM

## 2018-09-18 HISTORY — DX: Basal cell carcinoma of skin, unspecified: C44.91

## 2019-08-11 ENCOUNTER — Other Ambulatory Visit: Payer: Self-pay | Admitting: Internal Medicine

## 2019-08-11 DIAGNOSIS — I714 Abdominal aortic aneurysm, without rupture, unspecified: Secondary | ICD-10-CM

## 2019-08-25 ENCOUNTER — Ambulatory Visit
Admission: RE | Admit: 2019-08-25 | Discharge: 2019-08-25 | Disposition: A | Discharge status: Home / Self Care | Payer: Medicare Other | Source: Ambulatory Visit | Attending: Internal Medicine | Admitting: Internal Medicine

## 2019-08-25 ENCOUNTER — Other Ambulatory Visit: Payer: Self-pay

## 2019-08-25 DIAGNOSIS — I714 Abdominal aortic aneurysm, without rupture, unspecified: Secondary | ICD-10-CM

## 2020-07-23 ENCOUNTER — Other Ambulatory Visit
Admission: RE | Admit: 2020-07-23 | Discharge: 2020-07-23 | Disposition: A | Discharge status: Home / Self Care | Payer: Medicare Other | Source: Ambulatory Visit | Attending: Internal Medicine | Admitting: Internal Medicine

## 2020-07-23 DIAGNOSIS — J9601 Acute respiratory failure with hypoxia: Secondary | ICD-10-CM | POA: Diagnosis present

## 2020-07-23 LAB — TROPONIN I (HIGH SENSITIVITY): Troponin I (High Sensitivity): 29 ng/L — ABNORMAL HIGH (ref <18)

## 2020-07-26 ENCOUNTER — Ambulatory Visit
Admission: RE | Admit: 2020-07-26 | Discharge: 2020-07-26 | Disposition: A | Discharge status: Home / Self Care | Payer: Medicare Other | Source: Ambulatory Visit | Attending: Internal Medicine | Admitting: Internal Medicine

## 2020-07-26 ENCOUNTER — Other Ambulatory Visit: Payer: Self-pay

## 2020-07-26 ENCOUNTER — Other Ambulatory Visit: Payer: Self-pay | Admitting: Internal Medicine

## 2020-07-26 DIAGNOSIS — R0600 Dyspnea, unspecified: Secondary | ICD-10-CM

## 2020-07-26 DIAGNOSIS — R7989 Other specified abnormal findings of blood chemistry: Secondary | ICD-10-CM | POA: Diagnosis not present

## 2020-07-26 DIAGNOSIS — I7121 Aneurysm of the ascending aorta, without rupture: Secondary | ICD-10-CM | POA: Insufficient documentation

## 2020-07-26 MED ORDER — IOHEXOL 350 MG/ML SOLN
75.0000 mL | Freq: Once | INTRAVENOUS | Status: AC | PRN
  Administered 2020-07-26: 75 mL via INTRAVENOUS

## 2020-08-13 DIAGNOSIS — J9611 Chronic respiratory failure with hypoxia: Secondary | ICD-10-CM | POA: Insufficient documentation

## 2020-08-19 ENCOUNTER — Encounter (INDEPENDENT_AMBULATORY_CARE_PROVIDER_SITE_OTHER): Payer: Self-pay | Admitting: Vascular Surgery

## 2020-08-26 ENCOUNTER — Ambulatory Visit: Payer: Medicare Other | Admitting: Dermatology

## 2020-08-26 ENCOUNTER — Other Ambulatory Visit: Payer: Self-pay

## 2020-08-26 DIAGNOSIS — D485 Neoplasm of uncertain behavior of skin: Secondary | ICD-10-CM | POA: Diagnosis not present

## 2020-08-26 DIAGNOSIS — D239 Other benign neoplasm of skin, unspecified: Secondary | ICD-10-CM

## 2020-08-26 DIAGNOSIS — Z85828 Personal history of other malignant neoplasm of skin: Secondary | ICD-10-CM

## 2020-08-26 DIAGNOSIS — D229 Melanocytic nevi, unspecified: Secondary | ICD-10-CM

## 2020-08-26 DIAGNOSIS — Z1283 Encounter for screening for malignant neoplasm of skin: Secondary | ICD-10-CM | POA: Diagnosis not present

## 2020-08-26 DIAGNOSIS — L814 Other melanin hyperpigmentation: Secondary | ICD-10-CM

## 2020-08-26 DIAGNOSIS — L820 Inflamed seborrheic keratosis: Secondary | ICD-10-CM

## 2020-08-26 DIAGNOSIS — L570 Actinic keratosis: Secondary | ICD-10-CM | POA: Diagnosis not present

## 2020-08-26 DIAGNOSIS — Z86007 Personal history of in-situ neoplasm of skin: Secondary | ICD-10-CM | POA: Diagnosis not present

## 2020-08-26 DIAGNOSIS — D235 Other benign neoplasm of skin of trunk: Secondary | ICD-10-CM

## 2020-08-26 DIAGNOSIS — D1800 Hemangioma unspecified site: Secondary | ICD-10-CM

## 2020-08-26 DIAGNOSIS — L578 Other skin changes due to chronic exposure to nonionizing radiation: Secondary | ICD-10-CM

## 2020-08-26 HISTORY — DX: Actinic keratosis: L57.0

## 2020-08-26 NOTE — Progress Notes (Signed)
Follow-Up Visit   Subjective  Alexander Howe is a 74 y.o. male who presents for the following: FBSE.  Patient here for full body skin exam and skin cancer screening. He has a h/o MMis, BCC, AK's. Patient 7 weeks s/p Covid and is on oxygen at time of visit. He is being seen early next month at [facility] for 3 aneurysms.   The following portions of the chart were reviewed this encounter and updated as appropriate:  Tobacco  Allergies  Meds  Problems  Med Hx  Surg Hx  Fam Hx      Review of Systems:  No other skin or systemic complaints except as noted in HPI or Assessment and Plan.  Objective  Well appearing patient in no apparent distress; mood and affect are within normal limits.  A full examination was performed including scalp, head, eyes, ears, nose, lips, neck, chest, axillae, abdomen, back, buttocks, bilateral upper extremities, bilateral lower extremities, hands, feet, fingers, toes, fingernails, and toenails. All findings within normal limits unless otherwise noted below.  Objective  Right lateral chest: 1.3cm pink plaque R/o BCC vs SCC over amelanotic MM       Objective  Right pretibia: 0.6cm pink papule R/o BCC vs SCC over amelanotic MM       Objective  Left medial pretibia: 0.9cm scaly pink plaque R/o BCC       Objective  L frontal scalp x 7, L forehead x 3, L helix x 1, vertex scalp x 7, R frontal scalp x 1 (18): Erythematous thin papules/macules with gritty scale.   Objective  Left buttock: Firm pink/brown papulenodule with dimple sign.    Assessment & Plan  Neoplasm of uncertain behavior of skin (3) Right lateral chest  Skin / nail biopsy Type of biopsy: tangential   Informed consent: discussed and consent obtained   Timeout: patient name, date of birth, surgical site, and procedure verified   Patient was prepped and draped in usual sterile fashion: Area prepped with isopropyl alcohol. Anesthesia: the lesion was anesthetized in a  standard fashion   Anesthetic:  1% lidocaine w/ epinephrine 1-100,000 buffered w/ 8.4% NaHCO3 Instrument used: flexible razor blade   Hemostasis achieved with: aluminum chloride   Outcome: patient tolerated procedure well   Post-procedure details: wound care instructions given   Additional details:  Mupirocin and a bandage applied  Right pretibia  Skin / nail biopsy Type of biopsy: tangential   Informed consent: discussed and consent obtained   Timeout: patient name, date of birth, surgical site, and procedure verified   Patient was prepped and draped in usual sterile fashion: Area prepped with isopropyl alcohol. Anesthesia: the lesion was anesthetized in a standard fashion   Anesthetic:  1% lidocaine w/ epinephrine 1-100,000 buffered w/ 8.4% NaHCO3 Instrument used: flexible razor blade   Hemostasis achieved with: aluminum chloride   Outcome: patient tolerated procedure well   Post-procedure details: wound care instructions given   Additional details:  Mupirocin and a bandage applied  Left medial pretibia  Skin / nail biopsy Type of biopsy: tangential   Informed consent: discussed and consent obtained   Timeout: patient name, date of birth, surgical site, and procedure verified   Patient was prepped and draped in usual sterile fashion: Area prepped with isopropyl alcohol. Anesthesia: the lesion was anesthetized in a standard fashion   Anesthetic:  1% lidocaine w/ epinephrine 1-100,000 buffered w/ 8.4% NaHCO3 Instrument used: flexible razor blade   Hemostasis achieved with: aluminum chloride   Outcome:  patient tolerated procedure well   Post-procedure details: wound care instructions given   Additional details:  Mupirocin and a bandage applied  AK (actinic keratosis) (18) L frontal scalp x 7, L forehead x 3, L helix x 1, vertex scalp x 7, R frontal scalp x 1  Will plan cryotherapy once recovered from Covid.   Dermatofibroma Left buttock  Benign-appearing.  Observation.   Call clinic for new or changing lesions.  Recommend daily use of broad spectrum spf 30+ sunscreen to sun-exposed areas.     History of Melanoma in Situ - No evidence of recurrence today at right upper back - No lymphadenopathy - Recommend regular full body skin exams - Recommend daily broad spectrum sunscreen SPF 30+ to sun-exposed areas, reapply every 2 hours as needed.  - Call if any new or changing lesions are noted between office visits   History of Basal Cell Carcinoma of the Skin - No evidence of recurrence today - Recommend regular full body skin exams - Recommend daily broad spectrum sunscreen SPF 30+ to sun-exposed areas, reapply every 2 hours as needed.  - Call if any new or changing lesions are noted between office visits  Lentigines - Scattered tan macules - Discussed due to sun exposure - Benign, observe - Call for any changes  Seborrheic Keratoses - Stuck-on, waxy, tan-brown papules and plaques  - Discussed benign etiology and prognosis. - Observe - Call for any changes  Melanocytic Nevi - Tan-brown and/or pink-flesh-colored symmetric macules and papules - Benign appearing on exam today - Observation - Call clinic for new or changing moles - Recommend daily use of broad spectrum spf 30+ sunscreen to sun-exposed areas.   Hemangiomas - Red papules - Discussed benign nature - Observe - Call for any changes  Actinic Damage - diffuse scaly erythematous macules with underlying dyspigmentation - Recommend daily broad spectrum sunscreen SPF 30+ to sun-exposed areas, reapply every 2 hours as needed.  - Call for new or changing lesions.  Skin cancer screening performed today.   Return in about 6 months (around 02/24/2021) for TBSE, 3 month AK follow up.  Graciella Belton, RMA, am acting as scribe for Forest Gleason, MD .  Documentation: I have reviewed the above documentation for accuracy and completeness, and I agree with the above.  Forest Gleason, MD

## 2020-08-26 NOTE — Patient Instructions (Addendum)
Melanoma ABCDEs  Melanoma is the most dangerous type of skin cancer, and is the leading cause of death from skin disease.  You are more likely to develop melanoma if you:  Have light-colored skin, light-colored eyes, or red or blond hair  Spend a lot of time in the sun  Tan regularly, either outdoors or in a tanning bed  Have had blistering sunburns, especially during childhood  Have a close family member who has had a melanoma  Have atypical moles or large birthmarks  Early detection of melanoma is key since treatment is typically straightforward and cure rates are extremely high if we catch it early.   The first sign of melanoma is often a change in a mole or a new dark spot.  The ABCDE system is a way of remembering the signs of melanoma.  A for asymmetry:  The two halves do not match. B for border:  The edges of the growth are irregular. C for color:  A mixture of colors are present instead of an even brown color. D for diameter:  Melanomas are usually (but not always) greater than 6mm - the size of a pencil eraser. E for evolution:  The spot keeps changing in size, shape, and color.  Please check your skin once per month between visits. You can use a small mirror in front and a large mirror behind you to keep an eye on the back side or your body.   If you see any new or changing lesions before your next follow-up, please call to schedule a visit.  Please continue daily skin protection including broad spectrum sunscreen SPF 30+ to sun-exposed areas, reapplying every 2 hours as needed when you're outdoors.   Wound Care Instructions  1. Cleanse wound gently with soap and water once a day then pat dry with clean gauze. Apply a thing coat of Petrolatum (petroleum jelly, "Vaseline") over the wound (unless you have an allergy to this). We recommend that you use a new, sterile tube of Vaseline. Do not pick or remove scabs. Do not remove the yellow or white "healing tissue" from the  base of the wound.  2. Cover the wound with fresh, clean, nonstick gauze and secure with paper tape. You may use Band-Aids in place of gauze and tape if the would is small enough, but would recommend trimming much of the tape off as there is often too much. Sometimes Band-Aids can irritate the skin.  3. You should call the office for your biopsy report after 1 week if you have not already been contacted.  4. If you experience any problems, such as abnormal amounts of bleeding, swelling, significant bruising, significant pain, or evidence of infection, please call the office immediately.  5. FOR ADULT SURGERY PATIENTS: If you need something for pain relief you may take 1 extra strength Tylenol (acetaminophen) AND 2 Ibuprofen (200mg each) together every 4 hours as needed for pain. (do not take these if you are allergic to them or if you have a reason you should not take them.) Typically, you may only need pain medication for 1 to 3 days.    Cryotherapy Aftercare  . Wash gently with soap and water everyday.   . Apply Vaseline and Band-Aid daily until healed.  Prior to procedure, discussed risks of blister formation, small wound, skin dyspigmentation, or rare scar following cryotherapy.   

## 2020-09-01 NOTE — Progress Notes (Signed)
1. Skin , right lateral chest HYPERTROPHIC ACTINIC KERATOSIS --> LN2 in clinic  2. Skin , right pretibia CONSISTENT WITH DERMATOFIBROMA, SHAVE SPECIMEN  Benign "scar-ball". No treatment needed.   3. Skin , left medial pretibia SUPERFICIAL BASAL CELL CARCINOMA --> ED&C ideally within the next 2 months  MAs please call. If Alexander Howe has any questions, please let me know and I am happy to call him.  Thank you!

## 2020-09-02 ENCOUNTER — Telehealth: Payer: Self-pay

## 2020-09-02 NOTE — Telephone Encounter (Signed)
-----   Message from Alfonso Patten, MD sent at 09/01/2020  5:34 PM EDT ----- 1. Skin , right lateral chest HYPERTROPHIC ACTINIC KERATOSIS --> LN2 in clinic  2. Skin , right pretibia CONSISTENT WITH DERMATOFIBROMA, SHAVE SPECIMEN  Benign "scar-ball". No treatment needed.   3. Skin , left medial pretibia SUPERFICIAL BASAL CELL CARCINOMA --> ED&C ideally within the next 2 months  MAs please call. If Alexander Howe has any questions, please let me know and I am happy to call him.  Thank you!

## 2020-09-02 NOTE — Telephone Encounter (Signed)
Informed pt of results. Scheduled Ln2 and EDC on 10/13/20 at 2:45.

## 2020-09-07 ENCOUNTER — Encounter: Payer: Self-pay | Admitting: Dermatology

## 2020-09-24 ENCOUNTER — Other Ambulatory Visit: Payer: Self-pay

## 2020-09-24 ENCOUNTER — Encounter: Payer: Medicare Other | Attending: Internal Medicine | Admitting: *Deleted

## 2020-09-24 DIAGNOSIS — J9611 Chronic respiratory failure with hypoxia: Secondary | ICD-10-CM | POA: Insufficient documentation

## 2020-09-24 NOTE — Progress Notes (Signed)
Initial telephone orientation completed. Diagnosis can be found in [facility] 11/2. EP orientation scheduled for Monday 11/15 at 1pm

## 2020-09-27 ENCOUNTER — Other Ambulatory Visit: Payer: Self-pay

## 2020-09-27 VITALS — Ht 73.5 in | Wt 254.6 lb

## 2020-09-27 DIAGNOSIS — J9611 Chronic respiratory failure with hypoxia: Secondary | ICD-10-CM | POA: Diagnosis present

## 2020-09-27 NOTE — Patient Instructions (Addendum)
Patient Instructions  Patient Details  Name: Alexander Howe MRN: [id number] Date of Birth: 09/19/1946 Referring Provider:  Adin Hector, MD  Below are your personal goals for exercise, nutrition, and risk factors. Our goal is to help you stay on track towards obtaining and maintaining these goals. We will be discussing your progress on these goals with you throughout the program.  Initial Exercise Prescription:  Initial Exercise Prescription - 09/27/20 1500      Date of Initial Exercise RX and Referring Provider   Date 09/27/20    Referring Provider Caryl Comes      Oxygen   Oxygen Continuous    Liters 3      Treadmill   MPH 1.5    Grade 0    Minutes 15    METs 2      Recumbant Bike   Level 1    RPM 60    Minutes 15    METs 2      NuStep   Level 2    SPM 80    Minutes 15    METs 2      REL-XR   Level 1    Speed 50    Minutes 15    METs 2      Prescription Details   Frequency (times per week) 3    Duration Progress to 30 minutes of continuous aerobic without signs/symptoms of physical distress      Intensity   THRR 40-80% of Max Heartrate 97-130    Ratings of Perceived Exertion 11-15    Perceived Dyspnea 0-4      Resistance Training   Training Prescription Yes    Weight 3 lb    Reps 10-15           Exercise Goals: Frequency: Be able to perform aerobic exercise two to three times per week in program working toward 2-5 days per week of home exercise.  Intensity: Work with a perceived exertion of 11 (fairly light) - 15 (hard) while following your exercise prescription.  We will make changes to your prescription with you as you progress through the program.   Duration: Be able to do 30 to 45 minutes of continuous aerobic exercise in addition to a 5 minute warm-up and a 5 minute cool-down routine.   Nutrition Goals: Your personal nutrition goals will be established when you do your nutrition analysis with the dietician.  The following are general  nutrition guidelines to follow: Cholesterol < 200mg /day Sodium < 1500mg /day Fiber: Men over 50 yrs - 30 grams per day  Personal Goals:  Personal Goals and Risk Factors at Admission - 09/27/20 1557      Core Components/Risk Factors/Patient Goals on Admission    Weight Management Weight Maintenance    Improve shortness of breath with ADL's Yes    Intervention Provide education, individualized exercise plan and daily activity instruction to help decrease symptoms of SOB with activities of daily living.    Expected Outcomes Short Term: Improve cardiorespiratory fitness to achieve a reduction of symptoms when performing ADLs;Long Term: Be able to perform more ADLs without symptoms or delay the onset of symptoms    Hypertension Yes    Intervention Provide education on lifestyle modifcations including regular physical activity/exercise, weight management, moderate sodium restriction and increased consumption of fresh fruit, vegetables, and low fat dairy, alcohol moderation, and smoking cessation.;Monitor prescription use compliance.    Lipids Yes    Intervention Provide education and support for participant on nutrition &  aerobic/resistive exercise along with prescribed medications to achieve LDL 70mg , HDL >40mg .    Expected Outcomes Short Term: Participant states understanding of desired cholesterol values and is compliant with medications prescribed. Participant is following exercise prescription and nutrition guidelines.;Long Term: Cholesterol controlled with medications as prescribed, with individualized exercise RX and with personalized nutrition plan. Value goals: LDL < 70mg , HDL > 40 mg.           Tobacco Use Initial Evaluation: Social History   Tobacco Use  Smoking Status Former Smoker  Smokeless Tobacco Former Systems developer  . Types: Chew  . Quit date: 11/29/1990    Exercise Goals and Review:  Exercise Goals    Row Name 09/27/20 1555             Exercise Goals   Increase Physical  Activity Yes       Intervention Provide advice, education, support and counseling about physical activity/exercise needs.;Develop an individualized exercise prescription for aerobic and resistive training based on initial evaluation findings, risk stratification, comorbidities and participant's personal goals.       Expected Outcomes Short Term: Attend rehab on a regular basis to increase amount of physical activity.;Long Term: Add in home exercise to make exercise part of routine and to increase amount of physical activity.;Long Term: Exercising regularly at least 3-5 days a week.       Increase Strength and Stamina Yes       Intervention Provide advice, education, support and counseling about physical activity/exercise needs.;Develop an individualized exercise prescription for aerobic and resistive training based on initial evaluation findings, risk stratification, comorbidities and participant's personal goals.       Expected Outcomes Short Term: Increase workloads from initial exercise prescription for resistance, speed, and METs.;Short Term: Perform resistance training exercises routinely during rehab and add in resistance training at home;Long Term: Improve cardiorespiratory fitness, muscular endurance and strength as measured by increased METs and functional capacity (6MWT)       Able to understand and use rate of perceived exertion (RPE) scale Yes       Intervention Provide education and explanation on how to use RPE scale       Expected Outcomes Short Term: Able to use RPE daily in rehab to express subjective intensity level;Long Term:  Able to use RPE to guide intensity level when exercising independently       Able to understand and use Dyspnea scale Yes       Intervention Provide education and explanation on how to use Dyspnea scale       Expected Outcomes Short Term: Able to use Dyspnea scale daily in rehab to express subjective sense of shortness of breath during exertion;Long Term: Able to  use Dyspnea scale to guide intensity level when exercising independently       Knowledge and understanding of Target Heart Rate Range (THRR) Yes       Intervention Provide education and explanation of THRR including how the numbers were predicted and where they are located for reference       Expected Outcomes Short Term: Able to state/look up THRR;Short Term: Able to use daily as guideline for intensity in rehab;Long Term: Able to use THRR to govern intensity when exercising independently       Able to check pulse independently Yes       Intervention Provide education and demonstration on how to check pulse in carotid and radial arteries.;Review the importance of being able to check your own pulse for safety during independent  exercise       Expected Outcomes Short Term: Able to explain why pulse checking is important during independent exercise;Long Term: Able to check pulse independently and accurately       Understanding of Exercise Prescription Yes       Intervention Provide education, explanation, and written materials on patient's individual exercise prescription       Expected Outcomes Short Term: Able to explain program exercise prescription;Long Term: Able to explain home exercise prescription to exercise independently              Copy of goals given to participant.

## 2020-09-27 NOTE — Progress Notes (Addendum)
Pulmonary Individual Treatment Plan  Patient Details  Name: Alexander Howe MRN: [id number] Date of Birth: 02-16-1946 Referring Provider:     Pulmonary Rehab from 09/27/2020 in [facility] Cardiac and Pulmonary Rehab  Referring Provider Caryl Comes      Initial Encounter Date:    Pulmonary Rehab from 09/27/2020 in [facility] Cardiac and Pulmonary Rehab  Date 09/27/20      Visit Diagnosis: Chronic respiratory failure with hypoxia ([place])  Patient's Home Medications on Admission:  Current Outpatient Medications:  .  albuterol (VENTOLIN HFA) 108 (90 Base) MCG/ACT inhaler, SMARTSIG:2 Inhalation Via Inhaler Every 6 Hours PRN, Disp: , Rfl:  .  ALPRAZolam (XANAX) 0.5 MG tablet, Take 0.5 mg by mouth 2 (two) times daily as needed for anxiety., Disp: , Rfl:  .  amLODipine (NORVASC) 5 MG tablet, Take 5 mg by mouth daily., Disp: , Rfl:  .  amoxicillin (AMOXIL) 500 MG capsule, Take 500 mg by mouth 3 (three) times daily. (Patient not taking: Reported on 09/24/2020), Disp: , Rfl:  .  aspirin EC 81 MG tablet, Take 81 mg by mouth daily., Disp: , Rfl:  .  atorvastatin (LIPITOR) 80 MG tablet, Take 80 mg by mouth daily., Disp: , Rfl:  .  buPROPion (ZYBAN) 150 MG 12 hr tablet, Take 150 mg by mouth 2 (two) times daily. (Patient not taking: Reported on 09/24/2020), Disp: , Rfl:  .  Cholecalciferol (VITAMIN D3) 2000 units TABS, Take 1 tablet by mouth daily., Disp: , Rfl:  .  docusate sodium (COLACE) 100 MG capsule, Take 200 mg by mouth daily. (Patient not taking: Reported on 09/24/2020), Disp: , Rfl:  .  enoxaparin (LOVENOX) 40 MG/0.4ML injection, Inject 0.4 mLs (40 mg total) into the skin daily. (Patient not taking: Reported on 09/24/2020), Disp: 14 Syringe, Rfl: 0 .  furosemide (LASIX) 20 MG tablet, Take 20 mg by mouth daily as needed. (Patient not taking: Reported on 09/24/2020), Disp: , Rfl:  .  hydrochlorothiazide (HYDRODIURIL) 25 MG tablet, Take 25 mg by mouth daily., Disp: , Rfl:  .  magnesium oxide (MAG-OX) 400 MG  tablet, Take 400 mg by mouth daily. (Patient not taking: Reported on 09/24/2020), Disp: , Rfl:  .  metoprolol succinate (TOPROL-XL) 100 MG 24 hr tablet, Take 100 mg by mouth at bedtime. Take with or immediately following a meal., Disp: , Rfl:  .  Omega-3 Fatty Acids (FISH OIL CONCENTRATE PO), Take 1 capsule by mouth daily., Disp: , Rfl:  .  oxyCODONE (OXY IR/ROXICODONE) 5 MG immediate release tablet, Take 1-2 tablets (5-10 mg total) by mouth every 4 (four) hours as needed for severe pain or breakthrough pain. (Patient not taking: Reported on 09/24/2020), Disp: 80 tablet, Rfl: 0 .  pantoprazole (PROTONIX) 40 MG tablet, Take 40 mg by mouth daily., Disp: , Rfl:  .  PARoxetine (PAXIL) 10 MG tablet, Take 10 mg by mouth daily., Disp: , Rfl:  .  potassium chloride SA (K-DUR,KLOR-CON) 20 MEQ tablet, Take 20 mEq by mouth daily., Disp: , Rfl:  .  predniSONE (DELTASONE) 20 MG tablet, Take 20 mg by mouth daily., Disp: , Rfl:  .  quinapril (ACCUPRIL) 40 MG tablet, Take 40 mg by mouth daily., Disp: , Rfl:  .  sulfamethoxazole-trimethoprim (BACTRIM) 400-80 MG tablet, SMARTSIG:1 Tablet(s) By Mouth Every 12 Hours, Disp: , Rfl:  .  tamsulosin (FLOMAX) 0.4 MG CAPS capsule, Take 0.4 mg by mouth daily., Disp: , Rfl:  .  traMADol (ULTRAM) 50 MG tablet, Take 1-2 tablets (50-100 mg total) by mouth  every 4 (four) hours as needed for moderate pain. (Patient not taking: Reported on 09/24/2020), Disp: 60 tablet, Rfl: 1  Past Medical History: Past Medical History:  Diagnosis Date  . AAA (abdominal aortic aneurysm) without rupture ([place])   . Abdominal aneurysm ([facility])    3.5 cm  . Anxiety   . Basal cell carcinoma 09/18/2018   left upper back/one excised, one superficial  . Basal cell carcinoma 11/28/2018   left post shoulder  . Chronic kidney disease    stage 3 / kidney stones  . Colon polyp   . Depression   . Diverticulitis   . Diverticulosis   . Hyperlipemia   . Hypertension   . Lipoma   . Melanoma ([place])  05/08/2018   in situ at right upper back/excision  . Neoplasm    benign, colon  . Onychomycosis   . Osteoarthritis   . Osteoarthritis   . Rheumatic fever   . Rheumatic fever   . Sleep apnea   . 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([place])   . Venous stasis   . Venous stasis   . Vitamin B 12 deficiency     Tobacco Use: Social History   Tobacco Use  Smoking Status Former Smoker  Smokeless Tobacco Former Systems developer  . Types: Chew  . Quit date: 11/29/1990    Labs: Recent Review Flowsheet Data   There is no flowsheet data to display.      Pulmonary Assessment Scores:   UCSD: Self-administered rating of dyspnea associated with activities of daily living (ADLs) 6-point scale (0 = "not at all" to 5 = "maximal or unable to do because of breathlessness")  Scoring Scores range from 0 to 120.  Minimally important difference is 5 units  CAT: CAT can identify the health impairment of COPD patients and is better correlated with disease progression.  CAT has a scoring range of zero to 40. The CAT score is classified into four groups of low (less than 10), medium (10 - 20), high (21-30) and very high (31-40) based on the impact level of disease on health status. A CAT score over 10 suggests significant symptoms.  A worsening CAT score could be explained by an exacerbation, poor medication adherence, poor inhaler technique, or progression of COPD or comorbid conditions.  CAT MCID is 2 points  mMRC: mMRC (Modified Medical Research Council) Dyspnea Scale is used to assess the degree of baseline functional disability in patients of respiratory disease due to dyspnea. No minimal important difference is established. A decrease in score of 1 point or greater is considered a positive change.   Pulmonary Function Assessment:   Exercise Target Goals: Exercise Program Goal: Individual exercise prescription set using results from initial 6 min walk test and THRR while considering  patient's activity barriers and  safety.   Exercise Prescription Goal: Initial exercise prescription builds to 30-45 minutes a day of aerobic activity, 2-3 days per week.  Home exercise guidelines will be given to patient during program as part of exercise prescription that the participant will acknowledge.  Education: Aerobic Exercise: - Group verbal and visual presentation on the components of exercise prescription. Introduces F.I.T.T principle from ACSM for exercise prescriptions.  Reviews F.I.T.T. principles of aerobic exercise including progression. Written material given at graduation.   Education: Resistance Exercise: - Group verbal and visual presentation on the components of exercise prescription. Introduces F.I.T.T principle from ACSM for exercise prescriptions  Reviews F.I.T.T. principles of resistance exercise including progression. Written material given at graduation.  Education: Exercise & Equipment Safety: - Individual verbal instruction and demonstration of equipment use and safety with use of the equipment.   Pulmonary Rehab from 09/27/2020 in [facility] Cardiac and Pulmonary Rehab  Date 09/27/20  Educator AS  Instruction Review Code 1- Verbalizes Understanding      Education: Exercise Physiology & General Exercise Guidelines: - Group verbal and written instruction with models to review the exercise physiology of the cardiovascular system and associated critical values. Provides general exercise guidelines with specific guidelines to those with heart or lung disease.    Education: Flexibility, Balance, Mind/Body Relaxation: - Group verbal and visual presentation with interactive activity on the components of exercise prescription. Introduces F.I.T.T principle from ACSM for exercise prescriptions. Reviews F.I.T.T. principles of flexibility and balance exercise training including progression. Also discusses the mind body connection.  Reviews various relaxation techniques to help reduce and manage stress (i.e.  Deep breathing, progressive muscle relaxation, and visualization). Balance handout provided to take home. Written material given at graduation.   Activity Barriers & Risk Stratification:  Activity Barriers & Cardiac Risk Stratification - 09/24/20 1309      Activity Barriers & Cardiac Risk Stratification   Activity Barriers Right Knee Replacement;Left Knee Replacement;Joint Problems;Muscular Weakness;Shortness of Breath;Other (comment);Arthritis    Comments Thoracic Ascending Aortic Aneurysm- keep BP below 135/80           6 Minute Walk:  6 Minute Walk    Row Name 09/27/20 1548         6 Minute Walk   Phase Initial     Distance 895 feet     Walk Time 6 minutes     # of Rest Breaks 0     MPH 1.7     METS 1.8     RPE 11     Perceived Dyspnea  0     VO2 Peak 6.26     Symptoms No     Resting HR 63 bpm     Resting BP 124/74     Resting Oxygen Saturation  95 %     Exercise Oxygen Saturation  during 6 min walk 91 %     Max Ex. HR 96 bpm     Max Ex. BP 152/84     2 Minute Post BP 124/78       Interval HR   1 Minute HR 84     2 Minute HR 88     3 Minute HR 89     4 Minute HR 94     5 Minute HR 96     6 Minute HR 94     2 Minute Post HR 82     Interval Heart Rate? Yes       Interval Oxygen   Interval Oxygen? Yes     Baseline Oxygen Saturation % 95 %     1 Minute Oxygen Saturation % 96 %     1 Minute Liters of Oxygen 3 L     2 Minute Oxygen Saturation % 95 %     2 Minute Liters of Oxygen 3 L     3 Minute Oxygen Saturation % 92 %     3 Minute Liters of Oxygen 3 L     4 Minute Oxygen Saturation % 91 %     4 Minute Liters of Oxygen 3 L     5 Minute Oxygen Saturation % 91 %     5 Minute Liters of Oxygen 3 L  6 Minute Oxygen Saturation % 91 %     6 Minute Liters of Oxygen 3 L     2 Minute Post Oxygen Saturation % 98 %     2 Minute Post Liters of Oxygen 3 L           Oxygen Initial Assessment:  Oxygen Initial Assessment - 09/24/20 1304      Home Oxygen    Home Oxygen Device Home Concentrator;E-Tanks    Sleep Oxygen Prescription CPAP    Home Exercise Oxygen Prescription Continuous    Liters per minute 3    Home Resting Oxygen Prescription Continuous    Liters per minute 3    Compliance with Home Oxygen Use Yes      Intervention   Short Term Goals To learn and exhibit compliance with exercise, home and travel O2 prescription;To learn and understand importance of monitoring SPO2 with pulse oximeter and demonstrate accurate use of the pulse oximeter.;To learn and understand importance of maintaining oxygen saturations>88%;To learn and demonstrate proper pursed lip breathing techniques or other breathing techniques.;To learn and demonstrate proper use of respiratory medications    Long  Term Goals Exhibits compliance with exercise, home and travel O2 prescription;Verbalizes importance of monitoring SPO2 with pulse oximeter and return demonstration;Maintenance of O2 saturations>88%;Exhibits proper breathing techniques, such as pursed lip breathing or other method taught during program session;Compliance with respiratory medication;Demonstrates proper use of MDI's           Oxygen Re-Evaluation:   Oxygen Discharge (Final Oxygen Re-Evaluation):   Initial Exercise Prescription:  Initial Exercise Prescription - 09/27/20 1500      Date of Initial Exercise RX and Referring Provider   Date 09/27/20    Referring Provider Caryl Comes      Oxygen   Oxygen Continuous    Liters 3      Treadmill   MPH 1.5    Grade 0    Minutes 15    METs 2      Recumbant Bike   Level 1    RPM 60    Minutes 15    METs 2      NuStep   Level 2    SPM 80    Minutes 15    METs 2      REL-XR   Level 1    Speed 50    Minutes 15    METs 2      Prescription Details   Frequency (times per week) 3    Duration Progress to 30 minutes of continuous aerobic without signs/symptoms of physical distress      Intensity   THRR 40-80% of Max Heartrate 97-130     Ratings of Perceived Exertion 11-15    Perceived Dyspnea 0-4      Resistance Training   Training Prescription Yes    Weight 3 lb    Reps 10-15           Perform Capillary Blood Glucose checks as needed.  Exercise Prescription Changes:  Exercise Prescription Changes    Row Name 09/27/20 1500             Response to Exercise   Blood Pressure (Admit) 124/74       Blood Pressure (Exercise) 152/84       Blood Pressure (Exit) 124/78       Heart Rate (Admit) 63 bpm       Heart Rate (Exercise) 96 bpm       Heart Rate (Exit) 82  bpm       Oxygen Saturation (Admit) 95 %       Oxygen Saturation (Exercise) 91 %       Oxygen Saturation (Exit) 98 %       Rating of Perceived Exertion (Exercise) 11       Perceived Dyspnea (Exercise) 0       Symptoms none              Exercise Comments:   Exercise Goals and Review:  Exercise Goals    Row Name 09/27/20 1555             Exercise Goals   Increase Physical Activity Yes       Intervention Provide advice, education, support and counseling about physical activity/exercise needs.;Develop an individualized exercise prescription for aerobic and resistive training based on initial evaluation findings, risk stratification, comorbidities and participant's personal goals.       Expected Outcomes Short Term: Attend rehab on a regular basis to increase amount of physical activity.;Long Term: Add in home exercise to make exercise part of routine and to increase amount of physical activity.;Long Term: Exercising regularly at least 3-5 days a week.       Increase Strength and Stamina Yes       Intervention Provide advice, education, support and counseling about physical activity/exercise needs.;Develop an individualized exercise prescription for aerobic and resistive training based on initial evaluation findings, risk stratification, comorbidities and participant's personal goals.       Expected Outcomes Short Term: Increase workloads from  initial exercise prescription for resistance, speed, and METs.;Short Term: Perform resistance training exercises routinely during rehab and add in resistance training at home;Long Term: Improve cardiorespiratory fitness, muscular endurance and strength as measured by increased METs and functional capacity (6MWT)       Able to understand and use rate of perceived exertion (RPE) scale Yes       Intervention Provide education and explanation on how to use RPE scale       Expected Outcomes Short Term: Able to use RPE daily in rehab to express subjective intensity level;Long Term:  Able to use RPE to guide intensity level when exercising independently       Able to understand and use Dyspnea scale Yes       Intervention Provide education and explanation on how to use Dyspnea scale       Expected Outcomes Short Term: Able to use Dyspnea scale daily in rehab to express subjective sense of shortness of breath during exertion;Long Term: Able to use Dyspnea scale to guide intensity level when exercising independently       Knowledge and understanding of Target Heart Rate Range (THRR) Yes       Intervention Provide education and explanation of THRR including how the numbers were predicted and where they are located for reference       Expected Outcomes Short Term: Able to state/look up THRR;Short Term: Able to use daily as guideline for intensity in rehab;Long Term: Able to use THRR to govern intensity when exercising independently       Able to check pulse independently Yes       Intervention Provide education and demonstration on how to check pulse in carotid and radial arteries.;Review the importance of being able to check your own pulse for safety during independent exercise       Expected Outcomes Short Term: Able to explain why pulse checking is important during independent exercise;Long Term: Able to check pulse  independently and accurately       Understanding of Exercise Prescription Yes        Intervention Provide education, explanation, and written materials on patient's individual exercise prescription       Expected Outcomes Short Term: Able to explain program exercise prescription;Long Term: Able to explain home exercise prescription to exercise independently              Exercise Goals Re-Evaluation :   Discharge Exercise Prescription (Final Exercise Prescription Changes):  Exercise Prescription Changes - 09/27/20 1500      Response to Exercise   Blood Pressure (Admit) 124/74    Blood Pressure (Exercise) 152/84    Blood Pressure (Exit) 124/78    Heart Rate (Admit) 63 bpm    Heart Rate (Exercise) 96 bpm    Heart Rate (Exit) 82 bpm    Oxygen Saturation (Admit) 95 %    Oxygen Saturation (Exercise) 91 %    Oxygen Saturation (Exit) 98 %    Rating of Perceived Exertion (Exercise) 11    Perceived Dyspnea (Exercise) 0    Symptoms none           Nutrition:  Target Goals: Understanding of nutrition guidelines, daily intake of sodium 1500mg , cholesterol 200mg , calories 30% from fat and 7% or less from saturated fats, daily to have 5 or more servings of fruits and vegetables.  Education: All About Nutrition: -Group instruction provided by verbal, written material, interactive activities, discussions, models, and posters to present general guidelines for heart healthy nutrition including fat, fiber, MyPlate, the role of sodium in heart healthy nutrition, utilization of the nutrition label, and utilization of this knowledge for meal planning. Follow up email sent as well. Written material given at graduation.   Biometrics:  Pre Biometrics - 09/27/20 1556      Pre Biometrics   Height 6' 1.5" (1.867 m)    Weight 254 lb 9.6 oz (115.5 kg)    BMI (Calculated) 33.13    Single Leg Stand 30 seconds            Nutrition Therapy Plan and Nutrition Goals:   Nutrition Assessments:  MEDIFICTS Score Key:  ?70 Need to make dietary changes   40-70 Heart Healthy  Diet  ? 40 Therapeutic Level Cholesterol Diet   Picture Your Plate Scores:  <24 Unhealthy dietary pattern with much room for improvement.  41-50 Dietary pattern unlikely to meet recommendations for good health and room for improvement.  51-60 More healthful dietary pattern, with some room for improvement.   >60 Healthy dietary pattern, although there may be some specific behaviors that could be improved.   Nutrition Goals Re-Evaluation:   Nutrition Goals Discharge (Final Nutrition Goals Re-Evaluation):   Psychosocial: Target Goals: Acknowledge presence or absence of significant depression and/or stress, maximize coping skills, provide positive support system. Participant is able to verbalize types and ability to use techniques and skills needed for reducing stress and depression.   Education: Stress, Anxiety, and Depression - Group verbal and visual presentation to define topics covered.  Reviews how body is impacted by stress, anxiety, and depression.  Also discusses healthy ways to reduce stress and to treat/manage anxiety and depression.  Written material given at graduation.   Education: Sleep Hygiene -Provides group verbal and written instruction about how sleep can affect your health.  Define sleep hygiene, discuss sleep cycles and impact of sleep habits. Review good sleep hygiene tips.    Initial Review & Psychosocial Screening:  Initial Psych Review &  Screening - 09/24/20 1308      Initial Review   Current issues with Current Stress Concerns    Source of Stress Concerns Unable to participate in former interests or hobbies;Unable to perform yard/household activities;Chronic Illness      Family Dynamics   Good Support System? Yes   wife     Barriers   Psychosocial barriers to participate in program There are no identifiable barriers or psychosocial needs.      Screening Interventions   Interventions Encouraged to exercise;To provide support and resources with  identified psychosocial needs;Provide feedback about the scores to participant    Expected Outcomes Short Term goal: Utilizing psychosocial counselor, staff and physician to assist with identification of specific Stressors or current issues interfering with healing process. Setting desired goal for each stressor or current issue identified.;Long Term Goal: Stressors or current issues are controlled or eliminated.;Short Term goal: Identification and review with participant of any Quality of Life or Depression concerns found by scoring the questionnaire.;Long Term goal: The participant improves quality of Life and PHQ9 Scores as seen by post scores and/or verbalization of changes           Quality of Life Scores:  Scores of 19 and below usually indicate a poorer quality of life in these areas.  A difference of  2-3 points is a clinically meaningful difference.  A difference of 2-3 points in the total score of the Quality of Life Index has been associated with significant improvement in overall quality of life, self-image, physical symptoms, and general health in studies assessing change in quality of life.  PHQ-9: Recent Review Flowsheet Data   There is no flowsheet data to display.    Interpretation of Total Score  Total Score Depression Severity:  1-4 = Minimal depression, 5-9 = Mild depression, 10-14 = Moderate depression, 15-19 = Moderately severe depression, 20-27 = Severe depression   Psychosocial Evaluation and Intervention:  Psychosocial Evaluation - 09/24/20 1324      Psychosocial Evaluation & Interventions   Comments Jobani reports doing well thanks to his supportive wife who was a Marine scientist. He had Covid in August of this year and now he is on oxygen. He states his shortness of breath has gotten better but he is looking forward to coming to pulmonary rehab to learn more and feel better. 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 It is stable and he is being followed  by vascular, but he can't get his blood pressure above 135/80s and doesn't know what all he can/can't do. He is looking forward to coming to learn more and increase his stamina.    Expected Outcomes Short: attend Pulmonary Rehab for education and exercise. Long: develop and maintain positive self care habits.    Continue Psychosocial Services  Follow up required by staff           Psychosocial Re-Evaluation:   Psychosocial Discharge (Final Psychosocial Re-Evaluation):   Education: Education Goals: Education classes will be provided on a weekly basis, covering required topics. Participant will state understanding/return demonstration of topics presented.  Learning Barriers/Preferences:  Learning Barriers/Preferences - 09/24/20 1308      Learning Barriers/Preferences   Learning Barriers None    Learning Preferences None           General Pulmonary Education Topics:  Infection Prevention: - Provides verbal and written material to individual with discussion of infection control including proper hand washing and proper equipment cleaning during exercise session.   Pulmonary Rehab from  09/27/2020 in [facility] Cardiac and Pulmonary Rehab  Date 09/27/20  Educator AS  Instruction Review Code 1- Verbalizes Understanding      Falls Prevention: - Provides verbal and written material to individual with discussion of falls prevention and safety.   Pulmonary Rehab from 09/27/2020 in [facility] Cardiac and Pulmonary Rehab  Date 09/27/20  Educator AS  Instruction Review Code 1- Verbalizes Understanding      Chronic Lung Disease Review: - Group verbal instruction with posters, models, PowerPoint presentations and videos,  to review new updates, new respiratory medications, new advancements in procedures and treatments. Providing information on websites and "800" numbers for continued self-education. Includes information about supplement oxygen, available portable oxygen systems, continuous and  intermittent flow rates, oxygen safety, concentrators, and Medicare reimbursement for oxygen. Explanation of Pulmonary Drugs, including class, frequency, complications, importance of spacers, rinsing mouth after steroid MDI's, and proper cleaning methods for nebulizers. Review of basic lung anatomy and physiology related to function, structure, and complications of lung disease. Review of risk factors. Discussion about methods for diagnosing sleep apnea and types of masks and machines for OSA. Includes a review of the use of types of environmental controls: home humidity, furnaces, filters, dust mite/pet prevention, HEPA vacuums. Discussion about weather changes, air quality and the benefits of nasal washing. Instruction on Warning signs, infection symptoms, calling MD promptly, preventive modes, and value of vaccinations. Review of effective airway clearance, coughing and/or vibration techniques. Emphasizing that all should Create an Action Plan. Written material given at graduation.   AED/CPR: - Group verbal and written instruction with the use of models to demonstrate the basic use of the AED with the basic ABC's of resuscitation.    Anatomy and Cardiac Procedures: - Group verbal and visual presentation and models provide information about basic cardiac anatomy and function. Reviews the testing methods done to diagnose heart disease and the outcomes of the test results. Describes the treatment choices: Medical Management, Angioplasty, or Coronary Bypass Surgery for treating various heart conditions including Myocardial Infarction, Angina, Valve Disease, and Cardiac Arrhythmias.  Written material given at graduation.   Medication Safety: - Group verbal and visual instruction to review commonly prescribed medications for heart and lung disease. Reviews the medication, class of the drug, and side effects. Includes the steps to properly store meds and maintain the prescription regimen.  Written material  given at graduation.   Other: -Provides group and verbal instruction on various topics (see comments)   Knowledge Questionnaire Score:    Core Components/Risk Factors/Patient Goals at Admission:  Personal Goals and Risk Factors at Admission - 09/27/20 1557      Core Components/Risk Factors/Patient Goals on Admission    Weight Management Weight Maintenance    Improve shortness of breath with ADL's Yes    Intervention Provide education, individualized exercise plan and daily activity instruction to help decrease symptoms of SOB with activities of daily living.    Expected Outcomes Short Term: Improve cardiorespiratory fitness to achieve a reduction of symptoms when performing ADLs;Long Term: Be able to perform more ADLs without symptoms or delay the onset of symptoms    Hypertension Yes    Intervention Provide education on lifestyle modifcations including regular physical activity/exercise, weight management, moderate sodium restriction and increased consumption of fresh fruit, vegetables, and low fat dairy, alcohol moderation, and smoking cessation.;Monitor prescription use compliance.    Lipids Yes    Intervention Provide education and support for participant on nutrition & aerobic/resistive exercise along with prescribed medications to achieve LDL <  70mg , HDL >40mg .    Expected Outcomes Short Term: Participant states understanding of desired cholesterol values and is compliant with medications prescribed. Participant is following exercise prescription and nutrition guidelines.;Long Term: Cholesterol controlled with medications as prescribed, with individualized exercise RX and with personalized nutrition plan. Value goals: LDL < 70mg , HDL > 40 mg.           Education:Diabetes - Individual verbal and written instruction to review signs/symptoms of diabetes, desired ranges of glucose level fasting, after meals and with exercise. Acknowledge that pre and post exercise glucose checks will be  done for 3 sessions at entry of program.   Know Your Numbers and Heart Failure: - Group verbal and visual instruction to discuss disease risk factors for cardiac and pulmonary disease and treatment options.  Reviews associated critical values for Overweight/Obesity, Hypertension, Cholesterol, and Diabetes.  Discusses basics of heart failure: signs/symptoms and treatments.  Introduces Heart Failure Zone chart for action plan for heart failure.  Written material given at graduation.   Core Components/Risk Factors/Patient Goals Review:    Core Components/Risk Factors/Patient Goals at Discharge (Final Review):    ITP Comments:  ITP Comments    Row Name 09/24/20 1329           ITP Comments Initial telephone orientation completed. Diagnosis can be found in [facility] 11/2. EP orientation scheduled for Monday 11/15 at 1pm              Comments: initial ITP

## 2020-09-29 ENCOUNTER — Other Ambulatory Visit: Payer: Self-pay

## 2020-09-29 DIAGNOSIS — J9611 Chronic respiratory failure with hypoxia: Secondary | ICD-10-CM | POA: Diagnosis not present

## 2020-09-29 NOTE — Progress Notes (Signed)
Daily Session Note  Patient Details  Name: Alexander Howe MRN: [id number] Date of Birth: 1946/05/13 Referring Provider:     Pulmonary Rehab from 09/27/2020 in [facility] Cardiac and Pulmonary Rehab  Referring Provider Caryl Comes      Encounter Date: 09/29/2020  Check In:  Session Check In - 09/29/20 1423      Check-In   Supervising physician immediately available to respond to emergencies See telemetry face sheet for immediately available ER MD    Location [facility]-Cardiac & Pulmonary Rehab    Staff Present Birdie Sons, MPA, RN;Joseph Lou Miner, MS Exercise Physiologist;Jessica Luan Pulling, [place], RCEP, CCRP, CCET    Virtual Visit No    Medication changes reported     No    Fall or balance concerns reported    No    Warm-up and Cool-down Performed on first and last piece of equipment    Resistance Training Performed Yes    VAD Patient? No    PAD/SET Patient? No      Pain Assessment   Currently in Pain? No/denies              Social History   Tobacco Use  Smoking Status Former Smoker  Smokeless Tobacco Former Systems developer  . Types: Chew  . Quit date: 11/29/1990    Goals Met:  Independence with exercise equipment Exercise tolerated well No report of cardiac concerns or symptoms Strength training completed today  Goals Unmet:  Not Applicable  Comments: First full day of exercise!  Patient was oriented to gym and equipment including functions, settings, policies, and procedures.  Patient's individual exercise prescription and treatment plan were reviewed.  All starting workloads were established based on the results of the 6 minute walk test done at initial orientation visit.  The plan for exercise progression was also introduced and progression will be customized based on patient's performance and goals.    Dr. Emily Filbert is Medical Director for [place] and [facility].

## 2020-09-30 ENCOUNTER — Encounter: Payer: Medicare Other | Admitting: *Deleted

## 2020-09-30 ENCOUNTER — Other Ambulatory Visit: Payer: Self-pay

## 2020-09-30 DIAGNOSIS — J9611 Chronic respiratory failure with hypoxia: Secondary | ICD-10-CM | POA: Diagnosis not present

## 2020-09-30 NOTE — Progress Notes (Signed)
Daily Session Note  Patient Details  Name: Alexander Howe MRN: [id number] Date of Birth: 11/01/1946 Referring Provider:     Pulmonary Rehab from 09/27/2020 in [facility] Cardiac and Pulmonary Rehab  Referring Provider Klein      Encounter Date: 09/30/2020  Check In:  Session Check In - 09/30/20 1359      Check-In   Supervising physician immediately available to respond to emergencies See telemetry face sheet for immediately available ER MD    Location [facility]-Cardiac & Pulmonary Rehab    Staff Present Joseph Hood RCP,RRT,BSRT;Meredith Craven, RN BSN;Jessica Hawkins, MA, RCEP, CCRP, CCET    Virtual Visit No    Medication changes reported     No    Fall or balance concerns reported    No    Warm-up and Cool-down Performed on first and last piece of equipment    Resistance Training Performed Yes    VAD Patient? No    PAD/SET Patient? No      Pain Assessment   Currently in Pain? No/denies              Social History   Tobacco Use  Smoking Status Former Smoker  Smokeless Tobacco Former User  . Types: Chew  . Quit date: 11/29/1990    Goals Met:  Independence with exercise equipment Personal goals reviewed No report of cardiac concerns or symptoms Strength training completed today  Goals Unmet:  Not Applicable  Comments: Pt able to follow exercise prescription today without complaint.  Will continue to monitor for progression.    Dr. Mark Miller is Medical Director for [facility] and [facility]. 

## 2020-10-04 ENCOUNTER — Other Ambulatory Visit: Payer: Self-pay

## 2020-10-04 DIAGNOSIS — J9611 Chronic respiratory failure with hypoxia: Secondary | ICD-10-CM

## 2020-10-04 NOTE — Progress Notes (Signed)
Daily Session Note  Patient Details  Name: Alexander Howe MRN: [id number] Date of Birth: 1946/02/06 Referring Provider:     Pulmonary Rehab from 09/27/2020 in [facility] Cardiac and Pulmonary Rehab  Referring Provider Caryl Comes      Encounter Date: 10/04/2020  Check In:  Session Check In - 10/04/20 1336      Check-In   Supervising physician immediately available to respond to emergencies See telemetry face sheet for immediately available ER MD    Location [facility]-Cardiac & Pulmonary Rehab    Staff Present Birdie Sons, MPA, Mauricia Area, BS, ACSM CEP, Exercise Physiologist;Kara Eliezer Bottom, MS Exercise Physiologist    Virtual Visit No    Medication changes reported     Yes    Comments increased amlodipine from 86m to 142mone time per day/take in PM    Fall or balance concerns reported    No    Warm-up and Cool-down Performed on first and last piece of equipment    Resistance Training Performed Yes    VAD Patient? No    PAD/SET Patient? No      Pain Assessment   Currently in Pain? No/denies              Social History   Tobacco Use  Smoking Status Former Smoker  Smokeless Tobacco Former [employer] developer. Types: Chew  . Quit date: 11/29/1990    Goals Met:  Independence with exercise equipment Exercise tolerated well No report of cardiac concerns or symptoms Strength training completed today  Goals Unmet:  Not Applicable  Comments: Pt able to follow exercise prescription today without complaint.  Will continue to monitor for progression.    Dr. MaEmily Filberts Medical Director for [facility].

## 2020-10-06 ENCOUNTER — Other Ambulatory Visit: Payer: Self-pay

## 2020-10-06 ENCOUNTER — Encounter: Payer: Medicare Other | Admitting: *Deleted

## 2020-10-06 DIAGNOSIS — J9611 Chronic respiratory failure with hypoxia: Secondary | ICD-10-CM

## 2020-10-06 NOTE — Progress Notes (Signed)
Daily Session Note  Patient Details  Name: Alexander Howe MRN: [id number] Date of Birth: 1946-02-08 Referring Provider:     Pulmonary Rehab from 09/27/2020 in [facility] Cardiac and Pulmonary Rehab  Referring Provider Caryl Comes      Encounter Date: 10/06/2020  Check In:  Session Check In - 10/06/20 1453      Check-In   Supervising physician immediately available to respond to emergencies See telemetry face sheet for immediately available ER MD    Location [facility]-Cardiac & Pulmonary Rehab    Staff Present Birdie Sons, MPA, RN;Gale Klar, RN, BSN, CCRP;Meredith Sherryll Burger, RN Margurite Auerbach, MS Exercise Physiologist    Virtual Visit No    Medication changes reported     No    Fall or balance concerns reported    No    Warm-up and Cool-down Performed on first and last piece of equipment    Resistance Training Performed Yes    VAD Patient? No    PAD/SET Patient? No      Pain Assessment   Currently in Pain? No/denies              Social History   Tobacco Use  Smoking Status Former Smoker  Smokeless Tobacco Former Systems developer  . Types: Chew  . Quit date: 11/29/1990    Goals Met:  Independence with exercise equipment Exercise tolerated well No report of cardiac concerns or symptoms  Goals Unmet:  Not Applicable  Comments: Pt able to follow exercise prescription today without complaint.  Will continue to monitor for progression.    Dr. Emily Filbert is Medical Director for [place] and [facility].

## 2020-10-11 ENCOUNTER — Other Ambulatory Visit: Payer: Self-pay

## 2020-10-11 DIAGNOSIS — J9611 Chronic respiratory failure with hypoxia: Secondary | ICD-10-CM | POA: Diagnosis not present

## 2020-10-11 NOTE — Progress Notes (Signed)
Daily Session Note  Patient Details  Name: Alexander Howe MRN: [id number] Date of Birth: [birth year] Referring Provider:     Pulmonary Rehab from 09/27/2020 in [facility] Cardiac and Pulmonary Rehab  Referring Provider Caryl Comes      Encounter Date: 10/11/2020  Check In:  Session Check In - 10/11/20 1422      Check-In   Supervising physician immediately available to respond to emergencies See telemetry face sheet for immediately available ER MD    Location [facility]-Cardiac & Pulmonary Rehab    Staff Present Birdie Sons, MPA, Mauricia Area, BS, ACSM CEP, Exercise Physiologist;Kara Eliezer Bottom, MS Exercise Physiologist    Virtual Visit No    Medication changes reported     No    Fall or balance concerns reported    No    Warm-up and Cool-down Performed on first and last piece of equipment    Resistance Training Performed Yes    VAD Patient? No    PAD/SET Patient? No      Pain Assessment   Currently in Pain? No/denies              Social History   Tobacco Use  Smoking Status Former Smoker  Smokeless Tobacco Former Systems developer  . Types: Chew  . Quit date: 11/29/1990    Goals Met:  Independence with exercise equipment Exercise tolerated well No report of cardiac concerns or symptoms Strength training completed today  Goals Unmet:  Not Applicable  Comments: Pt able to follow exercise prescription today without complaint.  Will continue to monitor for progression.    Dr. Emily Filbert is Medical Director for [place] and [facility].

## 2020-10-13 ENCOUNTER — Encounter: Payer: Self-pay | Admitting: *Deleted

## 2020-10-13 ENCOUNTER — Encounter: Payer: Medicare Other | Attending: Internal Medicine

## 2020-10-13 ENCOUNTER — Other Ambulatory Visit: Payer: Self-pay

## 2020-10-13 ENCOUNTER — Ambulatory Visit: Payer: Medicare Other | Admitting: Dermatology

## 2020-10-13 DIAGNOSIS — J9611 Chronic respiratory failure with hypoxia: Secondary | ICD-10-CM | POA: Insufficient documentation

## 2020-10-13 DIAGNOSIS — C4492 Squamous cell carcinoma of skin, unspecified: Secondary | ICD-10-CM

## 2020-10-13 DIAGNOSIS — C44719 Basal cell carcinoma of skin of left lower limb, including hip: Secondary | ICD-10-CM

## 2020-10-13 DIAGNOSIS — C44529 Squamous cell carcinoma of skin of other part of trunk: Secondary | ICD-10-CM

## 2020-10-13 DIAGNOSIS — D489 Neoplasm of uncertain behavior, unspecified: Secondary | ICD-10-CM

## 2020-10-13 HISTORY — DX: Squamous cell carcinoma of skin, unspecified: C44.92

## 2020-10-13 NOTE — Patient Instructions (Signed)
Electrodesiccation and Curettage ("Scrape and Burn") Wound Care Instructions  1. Leave the original bandage on for 24 hours if possible.  If the bandage becomes soaked or soiled before that time, it is OK to remove it and examine the wound.  A small amount of post-operative bleeding is normal.  If excessive bleeding occurs, remove the bandage, place gauze over the site and apply continuous pressure (no peeking) over the area for 30 minutes. If this does not work, please call our clinic as soon as possible or page your doctor if it is after hours.   2. Once a day, cleanse the wound with soap and water. It is fine to shower. If a thick crust develops you may use a Q-tip dipped into dilute hydrogen peroxide (mix 1:1 with water) to dissolve it.  Hydrogen peroxide can slow the healing process, so use it only as needed.    3. After washing, apply petroleum jelly (Vaseline) or an antibiotic ointment if your doctor prescribed one for you, followed by a bandage.    4. For best healing, the wound should be covered with a layer of ointment at all times. If you are not able to keep the area covered with a bandage to hold the ointment in place, this may mean re-applying the ointment several times a day.  Continue this wound care until the wound has healed and is no longer open. It may take several weeks for the wound to heal and close.  Itching and mild discomfort is normal during the healing process.  If you have any discomfort, you can take Tylenol (acetaminophen) or ibuprofen as directed on the bottle. (Please do not take these if you have an allergy to them or cannot take them for another reason).  Some redness, tenderness and white or yellow material in the wound is normal healing.  If the area becomes very sore and red, or develops a thick yellow-green material (pus), it may be infected; please notify us.    Wound healing continues for up to one year following surgery. It is not unusual to experience pain  in the scar from time to time during the interval.  If the pain becomes severe or the scar thickens, you should notify the office.    A slight amount of redness in a scar is expected for the first six months.  After six months, the redness will fade and the scar will soften and fade.  The color difference becomes less noticeable with time.  If there are any problems, return for a post-op surgery check at your earliest convenience.  To improve the appearance of the scar, you can use silicone scar gel, cream, or sheets (such as Mederma or Serica) every night for up to one year. These are available over the counter (without a prescription).  Please call our office at [phone] for any questions or concerns.   Wound Care Instructions  5. Cleanse wound gently with soap and water once a day then pat dry with clean gauze. Apply a thing coat of Petrolatum (petroleum jelly, "Vaseline") over the wound (unless you have an allergy to this). We recommend that you use a new, sterile tube of Vaseline. Do not pick or remove scabs. Do not remove the yellow or white "healing tissue" from the base of the wound.  6. Cover the wound with fresh, clean, nonstick gauze and secure with paper tape. You may use Band-Aids in place of gauze and tape if the would is small enough, but would  recommend trimming much of the tape off as there is often too much. Sometimes Band-Aids can irritate the skin.  7. You should call the office for your biopsy report after 1 week if you have not already been contacted.  8. If you experience any problems, such as abnormal amounts of bleeding, swelling, significant bruising, significant pain, or evidence of infection, please call the office immediately.  9. FOR ADULT SURGERY PATIENTS: If you need something for pain relief you may take 1 extra strength Tylenol (acetaminophen) AND 2 Ibuprofen (200mg  each) together every 4 hours as needed for pain. (do not take these if you are allergic to  them or if you have a reason you should not take them.) Typically, you may only need pain medication for 1 to 3 days.

## 2020-10-13 NOTE — Progress Notes (Signed)
° °  Follow-Up Visit   Subjective  Alexander Howe is a 74 y.o. 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  The following portions of the chart were reviewed this encounter and updated as appropriate:  Tobacco   Allergies   Meds   Problems   Med Hx   Surg Hx   Fam Hx       Review of Systems: No other skin or systemic complaints except as noted in HPI or Assessment and Plan.   Objective  Well appearing patient in no apparent distress; mood and affect are within normal limits.  A focused examination was performed including chest and left leg. Relevant physical exam findings are noted in the Assessment and Plan.  Objective  right lateral chest: 1.2 cm think scaly pink papule R/o SCC  Objective  left medial pretibia: Biopsy proven BCC  Assessment & Plan  Neoplasm of uncertain behavior right lateral chest  Skin / nail biopsy Type of biopsy: tangential   Informed consent: discussed and consent obtained   Timeout: patient name, date of birth, surgical site, and procedure verified   Procedure prep:  Patient was prepped and draped in usual sterile fashion Prep type:  Isopropyl alcohol Anesthesia: the lesion was anesthetized in a standard fashion   Anesthetic:  1% lidocaine w/ epinephrine 1-100,000 buffered w/ 8.4% NaHCO3 Instrument used: flexible razor blade   Hemostasis achieved with: pressure, aluminum chloride and electrodesiccation   Outcome: patient tolerated procedure well   Post-procedure details: sterile dressing applied and wound care instructions given   Dressing type: bandage and petrolatum    Specimen 1 - Surgical pathology Differential Diagnosis: R/o SCC Check Margins: No 1.2 cm think scaly pink papule Area previously biopsied. 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  Destruction of lesion  Destruction method: electrodesiccation and curettage   Informed consent: discussed and consent obtained   Timeout:  patient name, date of birth, surgical site, and procedure verified Anesthesia: the lesion was anesthetized in a standard fashion   Anesthetic:  1% lidocaine w/ epinephrine 1-100,000 buffered w/ 8.4% NaHCO3 Curettage performed in three different directions: Yes   Electrodesiccation performed over the curetted area: Yes   Final wound size (cm):  1.3 Hemostasis achieved with:  electrodesiccation Outcome: patient tolerated procedure well with no complications   Post-procedure details: sterile dressing applied and wound care instructions given   Dressing type: petrolatum    Return in 8 weeks (on 12/09/2020) for as scheduled.   I, Harriett Sine, CMA, am acting as scribe for Forest Gleason, MD.  Documentation: I have reviewed the above documentation for accuracy and completeness, and I agree with the above.  Forest Gleason, MD

## 2020-10-13 NOTE — Progress Notes (Signed)
Pulmonary Individual Treatment Plan  Patient Details  Name: CAYLON SAINE MRN: [id number] Date of Birth: June 15, 1946 Referring Provider:     Pulmonary Rehab from 09/27/2020 in [facility] Cardiac and Pulmonary Rehab  Referring Provider Caryl Comes      Initial Encounter Date:    Pulmonary Rehab from 09/27/2020 in [facility] Cardiac and Pulmonary Rehab  Date 09/27/20      Visit Diagnosis: Chronic respiratory failure with hypoxia ([place])  Patient's Home Medications on Admission:  Current Outpatient Medications:  .  albuterol (VENTOLIN HFA) 108 (90 Base) MCG/ACT inhaler, SMARTSIG:2 Inhalation Via Inhaler Every 6 Hours PRN, Disp: , Rfl:  .  ALPRAZolam (XANAX) 0.5 MG tablet, Take 0.5 mg by mouth 2 (two) times daily as needed for anxiety., Disp: , Rfl:  .  amLODipine (NORVASC) 5 MG tablet, Take 5 mg by mouth daily., Disp: , Rfl:  .  amoxicillin (AMOXIL) 500 MG capsule, Take 500 mg by mouth 3 (three) times daily. (Patient not taking: Reported on 09/24/2020), Disp: , Rfl:  .  aspirin EC 81 MG tablet, Take 81 mg by mouth daily., Disp: , Rfl:  .  atorvastatin (LIPITOR) 80 MG tablet, Take 80 mg by mouth daily., Disp: , Rfl:  .  buPROPion (ZYBAN) 150 MG 12 hr tablet, Take 150 mg by mouth 2 (two) times daily. (Patient not taking: Reported on 09/24/2020), Disp: , Rfl:  .  Cholecalciferol (VITAMIN D3) 2000 units TABS, Take 1 tablet by mouth daily., Disp: , Rfl:  .  docusate sodium (COLACE) 100 MG capsule, Take 200 mg by mouth daily. (Patient not taking: Reported on 09/24/2020), Disp: , Rfl:  .  enoxaparin (LOVENOX) 40 MG/0.4ML injection, Inject 0.4 mLs (40 mg total) into the skin daily. (Patient not taking: Reported on 09/24/2020), Disp: 14 Syringe, Rfl: 0 .  furosemide (LASIX) 20 MG tablet, Take 20 mg by mouth daily as needed. (Patient not taking: Reported on 09/24/2020), Disp: , Rfl:  .  hydrochlorothiazide (HYDRODIURIL) 25 MG tablet, Take 25 mg by mouth daily., Disp: , Rfl:  .  magnesium oxide (MAG-OX) 400 MG  tablet, Take 400 mg by mouth daily. (Patient not taking: Reported on 09/24/2020), Disp: , Rfl:  .  metoprolol succinate (TOPROL-XL) 100 MG 24 hr tablet, Take 100 mg by mouth at bedtime. Take with or immediately following a meal., Disp: , Rfl:  .  Omega-3 Fatty Acids (FISH OIL CONCENTRATE PO), Take 1 capsule by mouth daily., Disp: , Rfl:  .  oxyCODONE (OXY IR/ROXICODONE) 5 MG immediate release tablet, Take 1-2 tablets (5-10 mg total) by mouth every 4 (four) hours as needed for severe pain or breakthrough pain. (Patient not taking: Reported on 09/24/2020), Disp: 80 tablet, Rfl: 0 .  pantoprazole (PROTONIX) 40 MG tablet, Take 40 mg by mouth daily., Disp: , Rfl:  .  PARoxetine (PAXIL) 10 MG tablet, Take 10 mg by mouth daily., Disp: , Rfl:  .  potassium chloride SA (K-DUR,KLOR-CON) 20 MEQ tablet, Take 20 mEq by mouth daily., Disp: , Rfl:  .  predniSONE (DELTASONE) 20 MG tablet, Take 20 mg by mouth daily., Disp: , Rfl:  .  quinapril (ACCUPRIL) 40 MG tablet, Take 40 mg by mouth daily., Disp: , Rfl:  .  sulfamethoxazole-trimethoprim (BACTRIM) 400-80 MG tablet, SMARTSIG:1 Tablet(s) By Mouth Every 12 Hours, Disp: , Rfl:  .  tamsulosin (FLOMAX) 0.4 MG CAPS capsule, Take 0.4 mg by mouth daily., Disp: , Rfl:  .  traMADol (ULTRAM) 50 MG tablet, Take 1-2 tablets (50-100 mg total) by mouth  every 4 (four) hours as needed for moderate pain. (Patient not taking: Reported on 09/24/2020), Disp: 60 tablet, Rfl: 1  Past Medical History: Past Medical History:  Diagnosis Date  . AAA (abdominal aortic aneurysm) without rupture ([place])   . Abdominal aneurysm ([facility])    3.5 cm  . Anxiety   . Basal cell carcinoma 09/18/2018   left upper back/one excised, one superficial  . Basal cell carcinoma 11/28/2018   left post shoulder  . Chronic kidney disease    stage 3 / kidney stones  . Colon polyp   . Depression   . Diverticulitis   . Diverticulosis   . Hyperlipemia   . Hypertension   . Lipoma   . Melanoma ([place])  05/08/2018   in situ at right upper back/excision  . Neoplasm    benign, colon  . Onychomycosis   . Osteoarthritis   . Osteoarthritis   . Rheumatic fever   . Rheumatic fever   . Sleep apnea   . 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([place])   . Venous stasis   . Venous stasis   . Vitamin B 12 deficiency     Tobacco Use: Social History   Tobacco Use  Smoking Status Former Smoker  Smokeless Tobacco Former Systems developer  . Types: Chew  . Quit date: 11/29/1990    Labs: Recent Review Flowsheet Data   There is no flowsheet data to display.      Pulmonary Assessment Scores:  Pulmonary Assessment Scores    Row Name 09/27/20 1602         ADL UCSD   ADL Phase Entry     SOB Score total 35     Rest 1     Walk 1     Stairs 3     Bath 1     Dress 1     Shop 1       CAT Score   CAT Score 4       mMRC Score   mMRC Score 0            UCSD: Self-administered rating of dyspnea associated with activities of daily living (ADLs) 6-point scale (0 = "not at all" to 5 = "maximal or unable to do because of breathlessness")  Scoring Scores range from 0 to 120.  Minimally important difference is 5 units  CAT: CAT can identify the health impairment of COPD patients and is better correlated with disease progression.  CAT has a scoring range of zero to 40. The CAT score is classified into four groups of low (less than 10), medium (10 - 20), high (21-30) and very high (31-40) based on the impact level of disease on health status. A CAT score over 10 suggests significant symptoms.  A worsening CAT score could be explained by an exacerbation, poor medication adherence, poor inhaler technique, or progression of COPD or comorbid conditions.  CAT MCID is 2 points  mMRC: mMRC (Modified Medical Research Council) Dyspnea Scale is used to assess the degree of baseline functional disability in patients of respiratory disease due to dyspnea. No minimal important difference is established. A decrease in score of 1  point or greater is considered a positive change.   Pulmonary Function Assessment:   Exercise Target Goals: Exercise Program Goal: Individual exercise prescription set using results from initial 6 min walk test and THRR while considering  patient's activity barriers and safety.   Exercise Prescription Goal: Initial exercise prescription builds to 30-45 minutes a day of aerobic activity,  2-3 days per week.  Home exercise guidelines will be given to patient during program as part of exercise prescription that the participant will acknowledge.  Education: Aerobic Exercise: - Group verbal and visual presentation on the components of exercise prescription. Introduces F.I.T.T principle from ACSM for exercise prescriptions.  Reviews F.I.T.T. principles of aerobic exercise including progression. Written material given at graduation.   Education: Resistance Exercise: - Group verbal and visual presentation on the components of exercise prescription. Introduces F.I.T.T principle from ACSM for exercise prescriptions  Reviews F.I.T.T. principles of resistance exercise including progression. Written material given at graduation.    Education: Exercise & Equipment Safety: - Individual verbal instruction and demonstration of equipment use and safety with use of the equipment.   Pulmonary Rehab from 10/13/2020 in [facility] Cardiac and Pulmonary Rehab  Date 09/27/20  Educator AS  Instruction Review Code 1- Verbalizes Understanding      Education: Exercise Physiology & General Exercise Guidelines: - Group verbal and written instruction with models to review the exercise physiology of the cardiovascular system and associated critical values. Provides general exercise guidelines with specific guidelines to those with heart or lung disease.    Education: Flexibility, Balance, Mind/Body Relaxation: - Group verbal and visual presentation with interactive activity on the components of exercise prescription.  Introduces F.I.T.T principle from ACSM for exercise prescriptions. Reviews F.I.T.T. principles of flexibility and balance exercise training including progression. Also discusses the mind body connection.  Reviews various relaxation techniques to help reduce and manage stress (i.e. Deep breathing, progressive muscle relaxation, and visualization). Balance handout provided to take home. Written material given at graduation.   Activity Barriers & Risk Stratification:  Activity Barriers & Cardiac Risk Stratification - 09/24/20 1309      Activity Barriers & Cardiac Risk Stratification   Activity Barriers Right Knee Replacement;Left Knee Replacement;Joint Problems;Muscular Weakness;Shortness of Breath;Other (comment);Arthritis    Comments Thoracic Ascending Aortic Aneurysm- keep BP below 135/80           6 Minute Walk:  6 Minute Walk    Row Name 09/27/20 1548         6 Minute Walk   Phase Initial     Distance 895 feet     Walk Time 6 minutes     # of Rest Breaks 0     MPH 1.7     METS 1.8     RPE 11     Perceived Dyspnea  0     VO2 Peak 6.26     Symptoms No     Resting HR 63 bpm     Resting BP 124/74     Resting Oxygen Saturation  95 %     Exercise Oxygen Saturation  during 6 min walk 91 %     Max Ex. HR 96 bpm     Max Ex. BP 152/84     2 Minute Post BP 124/78       Interval HR   1 Minute HR 84     2 Minute HR 88     3 Minute HR 89     4 Minute HR 94     5 Minute HR 96     6 Minute HR 94     2 Minute Post HR 82     Interval Heart Rate? Yes       Interval Oxygen   Interval Oxygen? Yes     Baseline Oxygen Saturation % 95 %     1 Minute Oxygen Saturation %  96 %     1 Minute Liters of Oxygen 3 L     2 Minute Oxygen Saturation % 95 %     2 Minute Liters of Oxygen 3 L     3 Minute Oxygen Saturation % 92 %     3 Minute Liters of Oxygen 3 L     4 Minute Oxygen Saturation % 91 %     4 Minute Liters of Oxygen 3 L     5 Minute Oxygen Saturation % 91 %     5 Minute Liters  of Oxygen 3 L     6 Minute Oxygen Saturation % 91 %     6 Minute Liters of Oxygen 3 L     2 Minute Post Oxygen Saturation % 98 %     2 Minute Post Liters of Oxygen 3 L           Oxygen Initial Assessment:  Oxygen Initial Assessment - 09/27/20 1607      Home Oxygen   Home Oxygen Device Home Concentrator;E-Tanks    Sleep Oxygen Prescription CPAP    Home Resting Oxygen Prescription Continuous    Liters per minute 3      Intervention   Short Term Goals To learn and exhibit compliance with exercise, home and travel O2 prescription;To learn and understand importance of monitoring SPO2 with pulse oximeter and demonstrate accurate use of the pulse oximeter.;To learn and understand importance of maintaining oxygen saturations>88%;To learn and demonstrate proper pursed lip breathing techniques or other breathing techniques.;To learn and demonstrate proper use of respiratory medications    Long  Term Goals Exhibits compliance with exercise, home and travel O2 prescription;Verbalizes importance of monitoring SPO2 with pulse oximeter and return demonstration;Maintenance of O2 saturations>88%;Exhibits proper breathing techniques, such as pursed lip breathing or other method taught during program session;Compliance with respiratory medication;Demonstrates proper use of MDI's           Oxygen Re-Evaluation:  Oxygen Re-Evaluation    Row Name 09/29/20 1429             Home Oxygen   Home Oxygen Device Home Concentrator;E-Tanks       Sleep Oxygen Prescription CPAP       Home Exercise Oxygen Prescription Continuous       Liters per minute 3       Home Resting Oxygen Prescription Continuous       Liters per minute 3       Compliance with Home Oxygen Use Yes         Goals/Expected Outcomes   Short Term Goals To learn and exhibit compliance with exercise, home and travel O2 prescription;To learn and understand importance of monitoring SPO2 with pulse oximeter and demonstrate accurate use of the  pulse oximeter.;To learn and understand importance of maintaining oxygen saturations>88%;To learn and demonstrate proper pursed lip breathing techniques or other breathing techniques.       Long  Term Goals Exhibits compliance with exercise, home and travel O2 prescription;Verbalizes importance of monitoring SPO2 with pulse oximeter and return demonstration;Maintenance of O2 saturations>88%;Exhibits proper breathing techniques, such as pursed lip breathing or other method taught during program session       Comments Reviewed PLB technique with pt.  Talked about how it works and it's importance in maintaining their exercise saturations.       Goals/Expected Outcomes Short: Become more profiecient at using PLB.   Long: Become independent at using PLB.  Oxygen Discharge (Final Oxygen Re-Evaluation):  Oxygen Re-Evaluation - 09/29/20 1429      Home Oxygen   Home Oxygen Device Home Concentrator;E-Tanks    Sleep Oxygen Prescription CPAP    Home Exercise Oxygen Prescription Continuous    Liters per minute 3    Home Resting Oxygen Prescription Continuous    Liters per minute 3    Compliance with Home Oxygen Use Yes      Goals/Expected Outcomes   Short Term Goals To learn and exhibit compliance with exercise, home and travel O2 prescription;To learn and understand importance of monitoring SPO2 with pulse oximeter and demonstrate accurate use of the pulse oximeter.;To learn and understand importance of maintaining oxygen saturations>88%;To learn and demonstrate proper pursed lip breathing techniques or other breathing techniques.    Long  Term Goals Exhibits compliance with exercise, home and travel O2 prescription;Verbalizes importance of monitoring SPO2 with pulse oximeter and return demonstration;Maintenance of O2 saturations>88%;Exhibits proper breathing techniques, such as pursed lip breathing or other method taught during program session    Comments Reviewed PLB technique with pt.   Talked about how it works and it's importance in maintaining their exercise saturations.    Goals/Expected Outcomes Short: Become more profiecient at using PLB.   Long: Become independent at using PLB.           Initial Exercise Prescription:  Initial Exercise Prescription - 09/27/20 1500      Date of Initial Exercise RX and Referring Provider   Date 09/27/20    Referring Provider Caryl Comes      Oxygen   Oxygen Continuous    Liters 3      Treadmill   MPH 1.5    Grade 0    Minutes 15    METs 2      Recumbant Bike   Level 1    RPM 60    Minutes 15    METs 2      NuStep   Level 2    SPM 80    Minutes 15    METs 2      REL-XR   Level 1    Speed 50    Minutes 15    METs 2      Prescription Details   Frequency (times per week) 3    Duration Progress to 30 minutes of continuous aerobic without signs/symptoms of physical distress      Intensity   THRR 40-80% of Max Heartrate 97-130    Ratings of Perceived Exertion 11-15    Perceived Dyspnea 0-4      Resistance Training   Training Prescription Yes    Weight 3 lb    Reps 10-15           Perform Capillary Blood Glucose checks as needed.  Exercise Prescription Changes:  Exercise Prescription Changes    Row Name 09/27/20 1500 10/04/20 1300           Response to Exercise   Blood Pressure (Admit) 124/74 128/70      Blood Pressure (Exercise) 152/84 128/66      Blood Pressure (Exit) 124/78 136/74      Heart Rate (Admit) 63 bpm 70 bpm      Heart Rate (Exercise) 96 bpm 98 bpm      Heart Rate (Exit) 82 bpm 72 bpm      Oxygen Saturation (Admit) 95 % 96 %      Oxygen Saturation (Exercise) 91 % 94 %  Oxygen Saturation (Exit) 98 % 96 %      Rating of Perceived Exertion (Exercise) 11 13      Perceived Dyspnea (Exercise) 0 3      Symptoms none none      Comments -- second full day of exercise      Duration -- Progress to 30 minutes of  aerobic without signs/symptoms of physical distress      Intensity -- THRR  unchanged        Progression   Progression -- Continue to progress workloads to maintain intensity without signs/symptoms of physical distress.      Average METs -- 1.83        Resistance Training   Training Prescription -- Yes      Weight -- 3 lb      Reps -- 10-15        Interval Training   Interval Training -- No        Oxygen   Oxygen -- Continuous      Liters -- 3        Treadmill   MPH -- 1.5      Grade -- 0      Minutes -- 15      METs -- 2.15        Recumbant Bike   Level -- 2      Minutes -- 15        NuStep   Level -- 2      Minutes -- 15      METs -- 1.5        REL-XR   Level -- 1      Minutes -- 15             Exercise Comments:  Exercise Comments    Row Name 09/29/20 1424           Exercise Comments First full day of exercise!  Patient was oriented to gym and equipment including functions, settings, policies, and procedures.  Patient's individual exercise prescription and treatment plan were reviewed.  All starting workloads were established based on the results of the 6 minute walk test done at initial orientation visit.  The plan for exercise progression was also introduced and progression will be customized based on patient's performance and goals.              Exercise Goals and Review:  Exercise Goals    Row Name 09/27/20 1555             Exercise Goals   Increase Physical Activity Yes       Intervention Provide advice, education, support and counseling about physical activity/exercise needs.;Develop an individualized exercise prescription for aerobic and resistive training based on initial evaluation findings, risk stratification, comorbidities and participant's personal goals.       Expected Outcomes Short Term: Attend rehab on a regular basis to increase amount of physical activity.;Long Term: Add in home exercise to make exercise part of routine and to increase amount of physical activity.;Long Term: Exercising regularly at least  3-5 days a week.       Increase Strength and Stamina Yes       Intervention Provide advice, education, support and counseling about physical activity/exercise needs.;Develop an individualized exercise prescription for aerobic and resistive training based on initial evaluation findings, risk stratification, comorbidities and participant's personal goals.       Expected Outcomes Short Term: Increase workloads from initial exercise prescription for resistance, speed, and METs.;Short Term:  Perform resistance training exercises routinely during rehab and add in resistance training at home;Long Term: Improve cardiorespiratory fitness, muscular endurance and strength as measured by increased METs and functional capacity (6MWT)       Able to understand and use rate of perceived exertion (RPE) scale Yes       Intervention Provide education and explanation on how to use RPE scale       Expected Outcomes Short Term: Able to use RPE daily in rehab to express subjective intensity level;Long Term:  Able to use RPE to guide intensity level when exercising independently       Able to understand and use Dyspnea scale Yes       Intervention Provide education and explanation on how to use Dyspnea scale       Expected Outcomes Short Term: Able to use Dyspnea scale daily in rehab to express subjective sense of shortness of breath during exertion;Long Term: Able to use Dyspnea scale to guide intensity level when exercising independently       Knowledge and understanding of Target Heart Rate Range (THRR) Yes       Intervention Provide education and explanation of THRR including how the numbers were predicted and where they are located for reference       Expected Outcomes Short Term: Able to state/look up THRR;Short Term: Able to use daily as guideline for intensity in rehab;Long Term: Able to use THRR to govern intensity when exercising independently       Able to check pulse independently Yes       Intervention Provide  education and demonstration on how to check pulse in carotid and radial arteries.;Review the importance of being able to check your own pulse for safety during independent exercise       Expected Outcomes Short Term: Able to explain why pulse checking is important during independent exercise;Long Term: Able to check pulse independently and accurately       Understanding of Exercise Prescription Yes       Intervention Provide education, explanation, and written materials on patient's individual exercise prescription       Expected Outcomes Short Term: Able to explain program exercise prescription;Long Term: Able to explain home exercise prescription to exercise independently              Exercise Goals Re-Evaluation :  Exercise Goals Re-Evaluation    Row Name 09/29/20 1424 10/04/20 1346           Exercise Goal Re-Evaluation   Exercise Goals Review Increase Physical Activity;Able to understand and use rate of perceived exertion (RPE) scale;Knowledge and understanding of Target Heart Rate Range (THRR);Understanding of Exercise Prescription;Able to understand and use Dyspnea scale;Able to check pulse independently;Increase Strength and Stamina Increase Physical Activity;Increase Strength and Stamina;Understanding of Exercise Prescription      Comments Reviewed RPE and dyspnea scales, THR and program prescription with pt today.  Pt voiced understanding and was given a copy of goals to take home. Eliseo Squires is off to a good start in rehab.  He has completed his frist two full days of exercise.  He even asked if he could come in a an extra day this week to make up a day for Thanksgiving.  We will continue to monitor his progress.      Expected Outcomes Short: Use RPE daily to regulate intensity. Long: Follow program prescription in THR. Short: Continue to attend rehab regularly  Long: continue to follow program prescription  Discharge Exercise Prescription (Final Exercise Prescription  Changes):  Exercise Prescription Changes - 10/04/20 1300      Response to Exercise   Blood Pressure (Admit) 128/70    Blood Pressure (Exercise) 128/66    Blood Pressure (Exit) 136/74    Heart Rate (Admit) 70 bpm    Heart Rate (Exercise) 98 bpm    Heart Rate (Exit) 72 bpm    Oxygen Saturation (Admit) 96 %    Oxygen Saturation (Exercise) 94 %    Oxygen Saturation (Exit) 96 %    Rating of Perceived Exertion (Exercise) 13    Perceived Dyspnea (Exercise) 3    Symptoms none    Comments second full day of exercise    Duration Progress to 30 minutes of  aerobic without signs/symptoms of physical distress    Intensity THRR unchanged      Progression   Progression Continue to progress workloads to maintain intensity without signs/symptoms of physical distress.    Average METs 1.83      Resistance Training   Training Prescription Yes    Weight 3 lb    Reps 10-15      Interval Training   Interval Training No      Oxygen   Oxygen Continuous    Liters 3      Treadmill   MPH 1.5    Grade 0    Minutes 15    METs 2.15      Recumbant Bike   Level 2    Minutes 15      NuStep   Level 2    Minutes 15    METs 1.5      REL-XR   Level 1    Minutes 15           Nutrition:  Target Goals: Understanding of nutrition guidelines, daily intake of sodium 1500mg , cholesterol 200mg , calories 30% from fat and 7% or less from saturated fats, daily to have 5 or more servings of fruits and vegetables.  Education: All About Nutrition: -Group instruction provided by verbal, written material, interactive activities, discussions, models, and posters to present general guidelines for heart healthy nutrition including fat, fiber, MyPlate, the role of sodium in heart healthy nutrition, utilization of the nutrition label, and utilization of this knowledge for meal planning. Follow up email sent as well. Written material given at graduation.   Biometrics:  Pre Biometrics - 09/27/20 1556       Pre Biometrics   Height 6' 1.5" (1.867 m)    Weight 254 lb 9.6 oz (115.5 kg)    BMI (Calculated) 33.13    Single Leg Stand 30 seconds            Nutrition Therapy Plan and Nutrition Goals:   Nutrition Assessments:  Nutrition Assessments - 09/27/20 1604      MEDFICTS Scores   Pre Score 24          MEDIFICTS Score Key:  ?70 Need to make dietary changes   40-70 Heart Healthy Diet  ? 40 Therapeutic Level Cholesterol Diet   Picture Your Plate Scores:  <67 Unhealthy dietary pattern with much room for improvement.  41-50 Dietary pattern unlikely to meet recommendations for good health and room for improvement.  51-60 More healthful dietary pattern, with some room for improvement.   >60 Healthy dietary pattern, although there may be some specific behaviors that could be improved.   Nutrition Goals Re-Evaluation:   Nutrition Goals Discharge (Final Nutrition Goals Re-Evaluation):   Psychosocial:  Target Goals: Acknowledge presence or absence of significant depression and/or stress, maximize coping skills, provide positive support system. Participant is able to verbalize types and ability to use techniques and skills needed for reducing stress and depression.   Education: Stress, Anxiety, and Depression - Group verbal and visual presentation to define topics covered.  Reviews how body is impacted by stress, anxiety, and depression.  Also discusses healthy ways to reduce stress and to treat/manage anxiety and depression.  Written material given at graduation.   Education: Sleep Hygiene -Provides group verbal and written instruction about how sleep can affect your health.  Define sleep hygiene, discuss sleep cycles and impact of sleep habits. Review good sleep hygiene tips.    Initial Review & Psychosocial Screening:  Initial Psych Review & Screening - 09/24/20 1308      Initial Review   Current issues with Current Stress Concerns    Source of Stress Concerns Unable  to participate in former interests or hobbies;Unable to perform yard/household activities;Chronic Illness      Family Dynamics   Good Support System? Yes   wife     Barriers   Psychosocial barriers to participate in program There are no identifiable barriers or psychosocial needs.      Screening Interventions   Interventions Encouraged to exercise;To provide support and resources with identified psychosocial needs;Provide feedback about the scores to participant    Expected Outcomes Short Term goal: Utilizing psychosocial counselor, staff and physician to assist with identification of specific Stressors or current issues interfering with healing process. Setting desired goal for each stressor or current issue identified.;Long Term Goal: Stressors or current issues are controlled or eliminated.;Short Term goal: Identification and review with participant of any Quality of Life or Depression concerns found by scoring the questionnaire.;Long Term goal: The participant improves quality of Life and PHQ9 Scores as seen by post scores and/or verbalization of changes           Quality of Life Scores:  Scores of 19 and below usually indicate a poorer quality of life in these areas.  A difference of  2-3 points is a clinically meaningful difference.  A difference of 2-3 points in the total score of the Quality of Life Index has been associated with significant improvement in overall quality of life, self-image, physical symptoms, and general health in studies assessing change in quality of life.  PHQ-9: Recent Review Flowsheet Data    Depression screen [facility] 2/9 09/27/2020   Decreased Interest 0   Down, Depressed, Hopeless 0   PHQ - 2 Score 0   Altered sleeping 0   Tired, decreased energy 0   Change in appetite 0   Feeling bad or failure about yourself  1   Trouble concentrating 1   Moving slowly or fidgety/restless 0   Suicidal thoughts 0   PHQ-9 Score 2   Difficult doing work/chores Not  difficult at all     Interpretation of Total Score  Total Score Depression Severity:  1-4 = Minimal depression, 5-9 = Mild depression, 10-14 = Moderate depression, 15-19 = Moderately severe depression, 20-27 = Severe depression   Psychosocial Evaluation and Intervention:  Psychosocial Evaluation - 09/24/20 1324      Psychosocial Evaluation & Interventions   Comments Batu reports doing well thanks to his supportive wife who was a Marine scientist. He had Covid in August of this year and now he is on oxygen. He states his shortness of breath has gotten better but he is looking forward to  coming to pulmonary rehab to learn more and feel better. 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 It is stable and he is being followed by vascular, but he can't get his blood pressure above 135/80s and doesn't know what all he can/can't do. He is looking forward to coming to learn more and increase his stamina.    Expected Outcomes Short: attend Pulmonary Rehab for education and exercise. Long: develop and maintain positive self care habits.    Continue Psychosocial Services  Follow up required by staff           Psychosocial Re-Evaluation:   Psychosocial Discharge (Final Psychosocial Re-Evaluation):   Education: Education Goals: Education classes will be provided on a weekly basis, covering required topics. Participant will state understanding/return demonstration of topics presented.  Learning Barriers/Preferences:  Learning Barriers/Preferences - 09/24/20 1308      Learning Barriers/Preferences   Learning Barriers None    Learning Preferences None           General Pulmonary Education Topics:  Infection Prevention: - Provides verbal and written material to individual with discussion of infection control including proper hand washing and proper equipment cleaning during exercise session.   Pulmonary Rehab from 10/13/2020 in [facility] Cardiac and Pulmonary Rehab  Date 09/27/20   Educator AS  Instruction Review Code 1- Verbalizes Understanding      Falls Prevention: - Provides verbal and written material to individual with discussion of falls prevention and safety.   Pulmonary Rehab from 10/13/2020 in [facility] Cardiac and Pulmonary Rehab  Date 09/27/20  Educator AS  Instruction Review Code 1- Verbalizes Understanding      Chronic Lung Disease Review: - Group verbal instruction with posters, models, PowerPoint presentations and videos,  to review new updates, new respiratory medications, new advancements in procedures and treatments. Providing information on websites and "800" numbers for continued self-education. Includes information about supplement oxygen, available portable oxygen systems, continuous and intermittent flow rates, oxygen safety, concentrators, and Medicare reimbursement for oxygen. Explanation of Pulmonary Drugs, including class, frequency, complications, importance of spacers, rinsing mouth after steroid MDI's, and proper cleaning methods for nebulizers. Review of basic lung anatomy and physiology related to function, structure, and complications of lung disease. Review of risk factors. Discussion about methods for diagnosing sleep apnea and types of masks and machines for OSA. Includes a review of the use of types of environmental controls: home humidity, furnaces, filters, dust mite/pet prevention, HEPA vacuums. Discussion about weather changes, air quality and the benefits of nasal washing. Instruction on Warning signs, infection symptoms, calling MD promptly, preventive modes, and value of vaccinations. Review of effective airway clearance, coughing and/or vibration techniques. Emphasizing that all should Create an Action Plan. Written material given at graduation.   Pulmonary Rehab from 10/13/2020 in [facility] Cardiac and Pulmonary Rehab  Date 10/13/20  Educator [facility]  Instruction Review Code 1- Verbalizes Understanding      AED/CPR: - Group verbal and  written instruction with the use of models to demonstrate the basic use of the AED with the basic ABC's of resuscitation.    Anatomy and Cardiac Procedures: - Group verbal and visual presentation and models provide information about basic cardiac anatomy and function. Reviews the testing methods done to diagnose heart disease and the outcomes of the test results. Describes the treatment choices: Medical Management, Angioplasty, or Coronary Bypass Surgery for treating various heart conditions including Myocardial Infarction, Angina, Valve Disease, and Cardiac Arrhythmias.  Written material given at graduation.   Medication Safety: -  Group verbal and visual instruction to review commonly prescribed medications for heart and lung disease. Reviews the medication, class of the drug, and side effects. Includes the steps to properly store meds and maintain the prescription regimen.  Written material given at graduation.   Other: -Provides group and verbal instruction on various topics (see comments)   Knowledge Questionnaire Score:  Knowledge Questionnaire Score - 09/27/20 1603      Knowledge Questionnaire Score   Pre Score 12/18 exercise oxygen            Core Components/Risk Factors/Patient Goals at Admission:  Personal Goals and Risk Factors at Admission - 09/27/20 1557      Core Components/Risk Factors/Patient Goals on Admission    Weight Management Weight Maintenance    Improve shortness of breath with ADL's Yes    Intervention Provide education, individualized exercise plan and daily activity instruction to help decrease symptoms of SOB with activities of daily living.    Expected Outcomes Short Term: Improve cardiorespiratory fitness to achieve a reduction of symptoms when performing ADLs;Long Term: Be able to perform more ADLs without symptoms or delay the onset of symptoms    Hypertension Yes    Intervention Provide education on lifestyle modifcations including regular physical  activity/exercise, weight management, moderate sodium restriction and increased consumption of fresh fruit, vegetables, and low fat dairy, alcohol moderation, and smoking cessation.;Monitor prescription use compliance.    Lipids Yes    Intervention Provide education and support for participant on nutrition & aerobic/resistive exercise along with prescribed medications to achieve LDL 70mg , HDL >40mg .    Expected Outcomes Short Term: Participant states understanding of desired cholesterol values and is compliant with medications prescribed. Participant is following exercise prescription and nutrition guidelines.;Long Term: Cholesterol controlled with medications as prescribed, with individualized exercise RX and with personalized nutrition plan. Value goals: LDL < 70mg , HDL > 40 mg.           Education:Diabetes - Individual verbal and written instruction to review signs/symptoms of diabetes, desired ranges of glucose level fasting, after meals and with exercise. Acknowledge that pre and post exercise glucose checks will be done for 3 sessions at entry of program.   Know Your Numbers and Heart Failure: - Group verbal and visual instruction to discuss disease risk factors for cardiac and pulmonary disease and treatment options.  Reviews associated critical values for Overweight/Obesity, Hypertension, Cholesterol, and Diabetes.  Discusses basics of heart failure: signs/symptoms and treatments.  Introduces Heart Failure Zone chart for action plan for heart failure.  Written material given at graduation.   Pulmonary Rehab from 10/13/2020 in [facility] Cardiac and Pulmonary Rehab  Date 10/06/20  Educator [facility]  Instruction Review Code 1- Verbalizes Understanding      Core Components/Risk Factors/Patient Goals Review:    Core Components/Risk Factors/Patient Goals at Discharge (Final Review):    ITP Comments:  ITP Comments    Row Name 09/24/20 1329 09/29/20 1423 10/13/20 1059       ITP Comments Initial  telephone orientation completed. Diagnosis can be found in [facility] 11/2. EP orientation scheduled for Monday 11/15 at 1pm First full day of exercise!  Patient was oriented to gym and equipment including functions, settings, policies, and procedures.  Patient's individual exercise prescription and treatment plan were reviewed.  All starting workloads were established based on the results of the 6 minute walk test done at initial orientation visit.  The plan for exercise progression was also introduced and progression will be customized based on patient's performance  and goals. 30 Day review completed. Medical Director ITP review done, changes made as directed, and signed approval by Medical Director.  New to program            Comments:

## 2020-10-13 NOTE — Progress Notes (Signed)
Daily Session Note  Patient Details  Name: LENZY KERSCHNER MRN: [id number] Date of Birth: 24-Jul-1946 Referring Provider:     Pulmonary Rehab from 09/27/2020 in [facility] Cardiac and Pulmonary Rehab  Referring Provider Caryl Comes      Encounter Date: 10/13/2020  Check In:  Session Check In - 10/13/20 0926      Check-In   Supervising physician immediately available to respond to emergencies See telemetry face sheet for immediately available ER MD    Location [facility]-Cardiac & Pulmonary Rehab    Staff Present Birdie Sons, MPA, Elveria Rising, BA, ACSM CEP, Exercise Physiologist;Kara Eliezer Bottom, MS Exercise Physiologist    Virtual Visit No    Medication changes reported     No    Fall or balance concerns reported    No    Warm-up and Cool-down Performed on first and last piece of equipment    Resistance Training Performed Yes    VAD Patient? No    PAD/SET Patient? No      Pain Assessment   Currently in Pain? No/denies              Social History   Tobacco Use  Smoking Status Former Smoker  Smokeless Tobacco Former Systems developer  . Types: Chew  . Quit date: 11/29/1990    Goals Met:  Independence with exercise equipment Exercise tolerated well No report of cardiac concerns or symptoms Strength training completed today  Goals Unmet:  Not Applicable  Comments: Pt able to follow exercise prescription today without complaint.  Will continue to monitor for progression.    Dr. Emily Filbert is Medical Director for [place] and [facility].

## 2020-10-14 ENCOUNTER — Encounter: Payer: Medicare Other | Admitting: *Deleted

## 2020-10-14 DIAGNOSIS — J9611 Chronic respiratory failure with hypoxia: Secondary | ICD-10-CM

## 2020-10-14 NOTE — Progress Notes (Signed)
Daily Session Note  Patient Details  Name: Alexander Howe MRN: [id number] Date of Birth: 1946-01-15 Referring Provider:     Pulmonary Rehab from 09/27/2020 in [facility] Cardiac and Pulmonary Rehab  Referring Provider Caryl Comes      Encounter Date: 10/14/2020  Check In:  Session Check In - 10/14/20 1410      Check-In   Supervising physician immediately available to respond to emergencies See telemetry face sheet for immediately available ER MD    Location [facility]-Cardiac & Pulmonary Rehab    Staff Present Renita Papa, RN BSN;Joseph Lou Miner, [place] Exercise Physiologist    Virtual Visit No    Medication changes reported     No    Fall or balance concerns reported    No    Warm-up and Cool-down Performed on first and last piece of equipment    Resistance Training Performed Yes    VAD Patient? No    PAD/SET Patient? No      Pain Assessment   Currently in Pain? No/denies              Social History   Tobacco Use  Smoking Status Former Smoker  Smokeless Tobacco Former Systems developer  . Types: Chew  . Quit date: 11/29/1990    Goals Met:  Independence with exercise equipment Exercise tolerated well No report of cardiac concerns or symptoms Strength training completed today  Goals Unmet:  Not Applicable  Comments: Pt able to follow exercise prescription today without complaint.  Will continue to monitor for progression.    Dr. Emily Filbert is Medical Director for [place] and [facility].

## 2020-10-18 ENCOUNTER — Other Ambulatory Visit: Payer: Self-pay

## 2020-10-18 DIAGNOSIS — J9611 Chronic respiratory failure with hypoxia: Secondary | ICD-10-CM | POA: Diagnosis not present

## 2020-10-18 NOTE — Progress Notes (Signed)
Daily Session Note  Patient Details  Name: Alexander Howe MRN: [id number] Date of Birth: 04/08/1946 Referring Provider:     Pulmonary Rehab from 09/27/2020 in [facility] Cardiac and Pulmonary Rehab  Referring Provider Caryl Comes      Encounter Date: 10/18/2020  Check In:  Session Check In - 10/18/20 1427      Check-In   Supervising physician immediately available to respond to emergencies See telemetry face sheet for immediately available ER MD    Location [facility]-Cardiac & Pulmonary Rehab    Staff Present Birdie Sons, MPA, Mauricia Area, BS, ACSM CEP, Exercise Physiologist;Kara Eliezer Bottom, MS Exercise Physiologist    Virtual Visit No    Medication changes reported     No    Fall or balance concerns reported    No    Warm-up and Cool-down Performed on first and last piece of equipment    Resistance Training Performed Yes    VAD Patient? No    PAD/SET Patient? No      Pain Assessment   Currently in Pain? No/denies              Social History   Tobacco Use  Smoking Status Former Smoker  Smokeless Tobacco Former Systems developer  . Types: Chew  . Quit date: 11/29/1990    Goals Met:  Independence with exercise equipment Exercise tolerated well No report of cardiac concerns or symptoms Strength training completed today  Goals Unmet:  Not Applicable  Comments: Pt able to follow exercise prescription today without complaint.  Will continue to monitor for progression.     Dr. Emily Filbert is Medical Director for [place] and [facility].

## 2020-10-20 ENCOUNTER — Other Ambulatory Visit: Payer: Self-pay

## 2020-10-20 DIAGNOSIS — J9611 Chronic respiratory failure with hypoxia: Secondary | ICD-10-CM

## 2020-10-20 NOTE — Progress Notes (Signed)
Daily Session Note  Patient Details  Name: Alexander Howe MRN: [id number] Date of Birth: 12-11-1945 Referring Provider:     Pulmonary Rehab from 09/27/2020 in [facility] Cardiac and Pulmonary Rehab  Referring Provider Caryl Comes      Encounter Date: 10/20/2020  Check In:  Session Check In - 10/20/20 1412      Check-In   Supervising physician immediately available to respond to emergencies See telemetry face sheet for immediately available ER MD    Location [facility]-Cardiac & Pulmonary Rehab    Staff Present Birdie Sons, MPA, RN;Joseph Lou Miner, [place] Exercise Physiologist    Virtual Visit No    Medication changes reported     No    Fall or balance concerns reported    No    Warm-up and Cool-down Performed on first and last piece of equipment    Resistance Training Performed Yes    VAD Patient? No    PAD/SET Patient? No      Pain Assessment   Currently in Pain? No/denies              Social History   Tobacco Use  Smoking Status Former Smoker  Smokeless Tobacco Former Systems developer  . Types: Chew  . Quit date: 11/29/1990    Goals Met:  Independence with exercise equipment Exercise tolerated well No report of cardiac concerns or symptoms Strength training completed today  Goals Unmet:  Not Applicable  Comments: Pt able to follow exercise prescription today without complaint.  Will continue to monitor for progression.    Dr. Emily Filbert is Medical Director for [place] and [facility].

## 2020-10-20 NOTE — Progress Notes (Signed)
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 Pt prefers excision.  MAs please schedule excision. Thank you!

## 2020-10-21 ENCOUNTER — Other Ambulatory Visit: Payer: Self-pay

## 2020-10-21 ENCOUNTER — Encounter: Payer: Medicare Other | Admitting: *Deleted

## 2020-10-21 ENCOUNTER — Telehealth: Payer: Self-pay

## 2020-10-21 DIAGNOSIS — J9611 Chronic respiratory failure with hypoxia: Secondary | ICD-10-CM

## 2020-10-21 NOTE — Telephone Encounter (Signed)
-----   Message from Alfonso Patten, MD sent at 10/20/2020  4:13 PM EST ----- Diagnosis Skin , right lateral chest WELL DIFFERENTIATED SQUAMOUS CELL CARCINOMA, BASE INVOLVED --> discussed ED&C vs excision. Pt prefers excision.  MAs please schedule excision. Thank you!

## 2020-10-21 NOTE — Progress Notes (Signed)
Daily Session Note  Patient Details  Name: Alexander Howe MRN: [id number] Date of Birth: Feb 13, 1946 Referring Provider:   Flowsheet Row Pulmonary Rehab from 09/27/2020 in [facility] Cardiac and Pulmonary Rehab  Referring Provider Caryl Comes      Encounter Date: 10/21/2020  Check In:  Session Check In - 10/21/20 1354      Check-In   Supervising physician immediately available to respond to emergencies See telemetry face sheet for immediately available ER MD    Location [facility]-Cardiac & Pulmonary Rehab    Staff Present Renita Papa, RN BSN;Joseph [address] [place], [place], [place], CCRP, CCET    Virtual Visit No    Medication changes reported     No    Fall or balance concerns reported    No    Warm-up and Cool-down Performed on first and last piece of equipment    Resistance Training Performed Yes    VAD Patient? No    PAD/SET Patient? No      Pain Assessment   Currently in Pain? No/denies              Social History   Tobacco Use  Smoking Status Former Smoker  Smokeless Tobacco Former Systems developer  . Types: Chew  . Quit date: 11/29/1990    Goals Met:  Independence with exercise equipment Exercise tolerated well No report of cardiac concerns or symptoms Strength training completed today  Goals Unmet:  Not Applicable  Comments: Pt able to follow exercise prescription today without complaint.  Will continue to monitor for progression.    Dr. Emily Filbert is Medical Director for [place] and [facility].

## 2020-10-21 NOTE — Telephone Encounter (Signed)
Called patient and he is scheduled for excision of bx proven SCC at chest 12/15/20 at 9am, JS

## 2020-10-25 ENCOUNTER — Other Ambulatory Visit: Payer: Self-pay

## 2020-10-25 DIAGNOSIS — J9611 Chronic respiratory failure with hypoxia: Secondary | ICD-10-CM

## 2020-10-25 NOTE — Progress Notes (Signed)
Daily Session Note ° °Patient Details  °Name: Alexander Howe °MRN: [id number] °Date of Birth: 03/20/1946 °Referring Provider:   °Flowsheet Row Pulmonary Rehab from 09/27/2020 in [facility] Cardiac and Pulmonary Rehab  °Referring Provider Klein  °  ° ° °Encounter Date: 10/25/2020 ° °Check In: ° Session Check In - 10/25/20 1413   °  ° Check-In  ° Supervising physician immediately available to respond to emergencies See telemetry face sheet for immediately available ER MD   ° Location [facility]-Cardiac & Pulmonary Rehab   ° Staff Present Kelly Bollinger, MPA, RN;Kelly Hayes, BS, ACSM CEP, Exercise Physiologist;Kara Langdon, MS Exercise Physiologist   ° Virtual Visit No   ° Medication changes reported     No   ° Fall or balance concerns reported    No   ° Warm-up and Cool-down Performed on first and last piece of equipment   ° Resistance Training Performed Yes   ° VAD Patient? No   ° PAD/SET Patient? No   °  ° Pain Assessment  ° Currently in Pain? No/denies   °  °  °  ° ° ° ° ° °Social History  ° °Tobacco Use  °Smoking Status Former Smoker  °Smokeless Tobacco Former User  °• Types: Chew  °• Quit date: 11/29/1990  ° ° °Goals Met:  °Independence with exercise equipment °Exercise tolerated well °Personal goals reviewed °No report of cardiac concerns or symptoms °Strength training completed today ° °Goals Unmet:  °Not Applicable ° °Comments: Pt able to follow exercise prescription today without complaint.  Will continue to monitor for progression. ° ° ° °Dr. Mark Miller is Medical Director for [facility] and [facility]. °

## 2020-10-27 ENCOUNTER — Other Ambulatory Visit: Payer: Self-pay

## 2020-10-27 ENCOUNTER — Encounter: Payer: Medicare Other | Admitting: *Deleted

## 2020-10-27 DIAGNOSIS — J9611 Chronic respiratory failure with hypoxia: Secondary | ICD-10-CM | POA: Diagnosis not present

## 2020-10-27 NOTE — Progress Notes (Signed)
Daily Session Note  Patient Details  Name: Alexander Howe MRN: [id number] Date of Birth: [birth year] Referring Provider:   Flowsheet Row Pulmonary Rehab from 09/27/2020 in [facility] Cardiac and Pulmonary Rehab  Referring Provider Caryl Comes      Encounter Date: 10/27/2020  Check In:  Session Check In - 10/27/20 1403      Check-In   Supervising physician immediately available to respond to emergencies See telemetry face sheet for immediately available ER MD    Location [facility]-Cardiac & Pulmonary Rehab    Staff Present Renita Papa, RN BSN;Joseph Lou Miner, [place] Exercise Physiologist;Jessica [place], [place], RCEP, CCRP, CCET    Virtual Visit No    Medication changes reported     No    Fall or balance concerns reported    No    Warm-up and Cool-down Performed on first and last piece of equipment    Resistance Training Performed Yes    VAD Patient? No    PAD/SET Patient? No      Pain Assessment   Currently in Pain? No/denies              Social History   Tobacco Use  Smoking Status Former Smoker  Smokeless Tobacco Former Systems developer  . Types: Chew  . Quit date: 11/29/1990    Goals Met:  Independence with exercise equipment Exercise tolerated well No report of cardiac concerns or symptoms Strength training completed today  Goals Unmet:  Not Applicable  Comments: Pt able to follow exercise prescription today without complaint.  Will continue to monitor for progression.    Dr. Emily Filbert is Medical Director for [place] and [facility].

## 2020-10-28 ENCOUNTER — Other Ambulatory Visit: Payer: Self-pay

## 2020-10-28 ENCOUNTER — Encounter: Payer: Medicare Other | Admitting: *Deleted

## 2020-10-28 DIAGNOSIS — J9611 Chronic respiratory failure with hypoxia: Secondary | ICD-10-CM

## 2020-10-28 NOTE — Progress Notes (Signed)
Daily Session Note  Patient Details  Name: Alexander Howe MRN: [id number] Date of Birth: 1946-09-24 Referring Provider:   Flowsheet Row Pulmonary Rehab from 09/27/2020 in [facility] Cardiac and Pulmonary Rehab  Referring Provider Caryl Comes      Encounter Date: 10/28/2020  Check In:  Session Check In - 10/28/20 1347      Check-In   Supervising physician immediately available to respond to emergencies See telemetry face sheet for immediately available ER MD    Location [facility]-Cardiac & Pulmonary Rehab    Staff Present Justin Mend RCP,RRT,BSRT;Yared Susan Sherryll Burger, RN BSN;Jessica Luan Pulling, MA, RCEP, CCRP, CCET    Virtual Visit No    Medication changes reported     No    Fall or balance concerns reported    No    Warm-up and Cool-down Performed on first and last piece of equipment    Resistance Training Performed Yes    VAD Patient? No    PAD/SET Patient? No      Pain Assessment   Currently in Pain? No/denies              Social History   Tobacco Use  Smoking Status Former Smoker  Smokeless Tobacco Former Systems developer  . Types: Chew  . Quit date: 11/29/1990    Goals Met:  Independence with exercise equipment Exercise tolerated well No report of cardiac concerns or symptoms Strength training completed today  Goals Unmet:  Not Applicable  Comments: Pt able to follow exercise prescription today without complaint.  Will continue to monitor for progression.    Dr. Emily Filbert is Medical Director for [place] and [facility].

## 2020-11-01 ENCOUNTER — Other Ambulatory Visit: Payer: Self-pay

## 2020-11-01 DIAGNOSIS — J9611 Chronic respiratory failure with hypoxia: Secondary | ICD-10-CM | POA: Diagnosis not present

## 2020-11-01 NOTE — Progress Notes (Signed)
Daily Session Note  Patient Details  Name: Alexander Howe MRN: [id number] Date of Birth: 1946/11/08 Referring Provider:   Flowsheet Row Pulmonary Rehab from 09/27/2020 in [facility] Cardiac and Pulmonary Rehab  Referring Provider Caryl Comes      Encounter Date: 11/01/2020  Check In:  Session Check In - 11/01/20 1359      Check-In   Supervising physician immediately available to respond to emergencies See telemetry face sheet for immediately available ER MD    Location [facility]-Cardiac & Pulmonary Rehab    Staff Present Birdie Sons, MPA, Mauricia Area, BS, ACSM CEP, Exercise Physiologist;Kara Eliezer Bottom, MS Exercise Physiologist    Virtual Visit No    Medication changes reported     No    Fall or balance concerns reported    No    Warm-up and Cool-down Performed on first and last piece of equipment    Resistance Training Performed Yes    VAD Patient? No    PAD/SET Patient? No      Pain Assessment   Currently in Pain? No/denies              Social History   Tobacco Use  Smoking Status Former Smoker  Smokeless Tobacco Former Systems developer  . Types: Chew  . Quit date: 11/29/1990    Goals Met:  Independence with exercise equipment Exercise tolerated well No report of cardiac concerns or symptoms Strength training completed today  Goals Unmet:  Not Applicable  Comments: Pt able to follow exercise prescription today without complaint.  Will continue to monitor for progression.    Dr. Emily Filbert is Medical Director for [place] and [facility].

## 2020-11-03 ENCOUNTER — Other Ambulatory Visit: Payer: Self-pay

## 2020-11-03 DIAGNOSIS — J9611 Chronic respiratory failure with hypoxia: Secondary | ICD-10-CM

## 2020-11-03 NOTE — Progress Notes (Signed)
Daily Session Note  Patient Details  Name: Alexander Howe MRN: [id number] Date of Birth: [birth year] Referring Provider:   Flowsheet Row Pulmonary Rehab from 09/27/2020 in [facility] Cardiac and Pulmonary Rehab  Referring Provider Caryl Comes      Encounter Date: 11/03/2020  Check In:  Session Check In - 11/03/20 1430      Check-In   Supervising physician immediately available to respond to emergencies See telemetry face sheet for immediately available ER MD    Location [facility]-Cardiac & Pulmonary Rehab    Staff Present Birdie Sons, MPA, RN;Joseph Darrin Nipper, [place], RCEP, CCRP, CCET    Virtual Visit No    Medication changes reported     No    Fall or balance concerns reported    No    Warm-up and Cool-down Performed on first and last piece of equipment    Resistance Training Performed Yes    VAD Patient? No    PAD/SET Patient? No      Pain Assessment   Currently in Pain? No/denies              Social History   Tobacco Use  Smoking Status Former Smoker  Smokeless Tobacco Former Systems developer   Types: Chew   Quit date: 11/29/1990    Goals Met:  Independence with exercise equipment Exercise tolerated well No report of cardiac concerns or symptoms Strength training completed today  Goals Unmet:  Not Applicable  Comments: Pt able to follow exercise prescription today without complaint.  Will continue to monitor for progression.    Dr. Emily Filbert is Medical Director for [place] and [facility].

## 2020-11-04 ENCOUNTER — Encounter: Payer: Medicare Other | Admitting: *Deleted

## 2020-11-04 ENCOUNTER — Other Ambulatory Visit: Payer: Self-pay

## 2020-11-04 DIAGNOSIS — J9611 Chronic respiratory failure with hypoxia: Secondary | ICD-10-CM

## 2020-11-04 NOTE — Progress Notes (Signed)
Daily Session Note  Patient Details  Name: Alexander Howe MRN: [id number] Date of Birth: 1946/08/17 Referring Provider:   Flowsheet Row Pulmonary Rehab from 09/27/2020 in [facility] Cardiac and Pulmonary Rehab  Referring Provider Caryl Comes      Encounter Date: 11/04/2020  Check In:  Session Check In - 11/04/20 1357      Check-In   Supervising physician immediately available to respond to emergencies See telemetry face sheet for immediately available ER MD    Location [facility]-Cardiac & Pulmonary Rehab    Staff Present Renita Papa, RN BSN;Melissa Caiola RDN, LDN;Jessica Luan Pulling, MA, RCEP, CCRP, CCET    Virtual Visit No    Medication changes reported     No    Fall or balance concerns reported    No    Warm-up and Cool-down Performed on first and last piece of equipment    Resistance Training Performed Yes    VAD Patient? No    PAD/SET Patient? No      Pain Assessment   Currently in Pain? No/denies              Social History   Tobacco Use  Smoking Status Former Smoker  Smokeless Tobacco Former Systems developer  . Types: Chew  . Quit date: 11/29/1990    Goals Met:  Independence with exercise equipment Exercise tolerated well No report of cardiac concerns or symptoms Strength training completed today  Goals Unmet:  Not Applicable  Comments: Pt able to follow exercise prescription today without complaint.  Will continue to monitor for progression.    Dr. Emily Filbert is Medical Director for [place] and [facility].

## 2020-11-08 ENCOUNTER — Encounter: Payer: Medicare Other | Admitting: *Deleted

## 2020-11-08 ENCOUNTER — Other Ambulatory Visit: Payer: Self-pay

## 2020-11-08 DIAGNOSIS — J9611 Chronic respiratory failure with hypoxia: Secondary | ICD-10-CM | POA: Diagnosis not present

## 2020-11-08 NOTE — Progress Notes (Signed)
Daily Session Note  Patient Details  Name: Alexander Howe MRN: [id number] Date of Birth: Dec 29, 1945 Referring Provider:   Flowsheet Row Pulmonary Rehab from 09/27/2020 in [facility] Cardiac and Pulmonary Rehab  Referring Provider Caryl Comes      Encounter Date: 11/08/2020  Check In:  Session Check In - 11/08/20 1356      Check-In   Supervising physician immediately available to respond to emergencies See telemetry face sheet for immediately available ER MD    Location [facility]-Cardiac & Pulmonary Rehab    Staff Present Renita Papa, RN BSN;Joseph Foy [place], [place], ACSM CEP, Exercise Physiologist    Virtual Visit No    Medication changes reported     No    Fall or balance concerns reported    No    Warm-up and Cool-down Performed on first and last piece of equipment    Resistance Training Performed Yes    VAD Patient? No    PAD/SET Patient? No      Pain Assessment   Currently in Pain? No/denies              Social History   Tobacco Use  Smoking Status Former Smoker  Smokeless Tobacco Former Systems developer  . Types: Chew  . Quit date: 11/29/1990    Goals Met:  Independence with exercise equipment Exercise tolerated well No report of cardiac concerns or symptoms Strength training completed today  Goals Unmet:  Not Applicable  Comments: Pt able to follow exercise prescription today without complaint.  Will continue to monitor for progression.    Dr. Emily Filbert is Medical Director for [place] and [facility].

## 2020-11-10 ENCOUNTER — Other Ambulatory Visit: Payer: Self-pay

## 2020-11-10 ENCOUNTER — Encounter: Payer: Medicare Other | Admitting: *Deleted

## 2020-11-10 ENCOUNTER — Encounter: Payer: Self-pay | Admitting: *Deleted

## 2020-11-10 ENCOUNTER — Encounter: Payer: Self-pay | Admitting: Dermatology

## 2020-11-10 DIAGNOSIS — J9611 Chronic respiratory failure with hypoxia: Secondary | ICD-10-CM

## 2020-11-10 NOTE — Progress Notes (Signed)
Daily Session Note  Patient Details  Name: Alexander Howe MRN: [id number] Date of Birth: 1946-07-25 Referring Provider:   Flowsheet Row Pulmonary Rehab from 09/27/2020 in [facility] Cardiac and Pulmonary Rehab  Referring Provider Caryl Comes      Encounter Date: 11/10/2020  Check In:  Session Check In - 11/10/20 1358      Check-In   Supervising physician immediately available to respond to emergencies See telemetry face sheet for immediately available ER MD    Location [facility]-Cardiac & Pulmonary Rehab    Staff Present Renita Papa, RN Margurite Auerbach, MS Exercise Physiologist;Laureen Owens Shark, BS, RRT, CPFT;Amanda Oletta Darter, BA, ACSM CEP, Exercise Physiologist    Virtual Visit No    Medication changes reported     No    Warm-up and Cool-down Performed on first and last piece of equipment    Resistance Training Performed Yes    VAD Patient? No    PAD/SET Patient? No      Pain Assessment   Currently in Pain? No/denies              Social History   Tobacco Use  Smoking Status Former Smoker  Smokeless Tobacco Former Systems developer  . Types: Chew  . Quit date: 11/29/1990    Goals Met:  Independence with exercise equipment Exercise tolerated well No report of cardiac concerns or symptoms Strength training completed today  Goals Unmet:  Not Applicable  Comments: Pt able to follow exercise prescription today without complaint.  Will continue to monitor for progression.    Dr. Emily Filbert is Medical Director for [place] and [facility].

## 2020-11-10 NOTE — Progress Notes (Signed)
Pulmonary Individual Treatment Plan  Patient Details  Name: Alexander Howe MRN: [id number] Date of Birth: Nov 19, 1945 Referring Provider:   Flowsheet Row Pulmonary Rehab from 09/27/2020 in [facility] Cardiac and Pulmonary Rehab  Referring Provider Caryl Comes      Initial Encounter Date:  Flowsheet Row Pulmonary Rehab from 09/27/2020 in [facility] Cardiac and Pulmonary Rehab  Date 09/27/20      Visit Diagnosis: Chronic respiratory failure with hypoxia ([place])  Patient's Home Medications on Admission:  Current Outpatient Medications:  .  albuterol (VENTOLIN HFA) 108 (90 Base) MCG/ACT inhaler, SMARTSIG:2 Inhalation Via Inhaler Every 6 Hours PRN, Disp: , Rfl:  .  ALPRAZolam (XANAX) 0.5 MG tablet, Take 0.5 mg by mouth 2 (two) times daily as needed for anxiety., Disp: , Rfl:  .  amLODipine (NORVASC) 5 MG tablet, Take 5 mg by mouth daily., Disp: , Rfl:  .  aspirin EC 81 MG tablet, Take 81 mg by mouth daily., Disp: , Rfl:  .  atorvastatin (LIPITOR) 80 MG tablet, Take 80 mg by mouth daily., Disp: , Rfl:  .  buPROPion (ZYBAN) 150 MG 12 hr tablet, Take 150 mg by mouth 2 (two) times daily. (Patient not taking: Reported on 09/24/2020), Disp: , Rfl:  .  Cholecalciferol (VITAMIN D3) 2000 units TABS, Take 1 tablet by mouth daily., Disp: , Rfl:  .  docusate sodium (COLACE) 100 MG capsule, Take 200 mg by mouth daily. (Patient not taking: Reported on 09/24/2020), Disp: , Rfl:  .  enoxaparin (LOVENOX) 40 MG/0.4ML injection, Inject 0.4 mLs (40 mg total) into the skin daily. (Patient not taking: Reported on 09/24/2020), Disp: 14 Syringe, Rfl: 0 .  furosemide (LASIX) 20 MG tablet, Take 20 mg by mouth daily as needed. (Patient not taking: Reported on 09/24/2020), Disp: , Rfl:  .  hydrochlorothiazide (HYDRODIURIL) 25 MG tablet, Take 25 mg by mouth daily., Disp: , Rfl:  .  magnesium oxide (MAG-OX) 400 MG tablet, Take 400 mg by mouth daily. (Patient not taking: Reported on 10/13/2020), Disp: , Rfl:  .  metoprolol succinate  (TOPROL-XL) 100 MG 24 hr tablet, Take 100 mg by mouth at bedtime. Take with or immediately following a meal., Disp: , Rfl:  .  Omega-3 Fatty Acids (FISH OIL CONCENTRATE PO), Take 1 capsule by mouth daily., Disp: , Rfl:  .  oxyCODONE (OXY IR/ROXICODONE) 5 MG immediate release tablet, Take 1-2 tablets (5-10 mg total) by mouth every 4 (four) hours as needed for severe pain or breakthrough pain. (Patient not taking: Reported on 09/24/2020), Disp: 80 tablet, Rfl: 0 .  pantoprazole (PROTONIX) 40 MG tablet, Take 40 mg by mouth daily., Disp: , Rfl:  .  PARoxetine (PAXIL) 10 MG tablet, Take 10 mg by mouth daily., Disp: , Rfl:  .  potassium chloride SA (K-DUR,KLOR-CON) 20 MEQ tablet, Take 20 mEq by mouth daily., Disp: , Rfl:  .  predniSONE (DELTASONE) 20 MG tablet, Take 20 mg by mouth daily. (Patient not taking: Reported on 10/20/2020), Disp: , Rfl:  .  quinapril (ACCUPRIL) 40 MG tablet, Take 40 mg by mouth daily., Disp: , Rfl:  .  sulfamethoxazole-trimethoprim (BACTRIM) 400-80 MG tablet, SMARTSIG:1 Tablet(s) By Mouth Every 12 Hours (Patient not taking: Reported on 10/20/2020), Disp: , Rfl:  .  tamsulosin (FLOMAX) 0.4 MG CAPS capsule, Take 0.4 mg by mouth daily. (Patient not taking: Reported on 10/20/2020), Disp: , Rfl:  .  traMADol (ULTRAM) 50 MG tablet, Take 1-2 tablets (50-100 mg total) by mouth every 4 (four) hours as needed for moderate  pain. (Patient not taking: Reported on 09/24/2020), Disp: 60 tablet, Rfl: 1  Past Medical History: Past Medical History:  Diagnosis Date  . AAA (abdominal aortic aneurysm) without rupture (HCC)   . Abdominal aneurysm (HCC)    3.5 cm  . Anxiety   . Basal cell carcinoma 09/18/2018   left upper back/one excised, one superficial  . Basal cell carcinoma 11/28/2018   left post shoulder  . Chronic kidney disease    stage 3 / kidney stones  . Colon polyp   . Depression   . Diverticulitis   . Diverticulosis   . Hyperlipemia   . Hypertension   . Lipoma   . Melanoma  (HCC) 05/08/2018   in situ at right upper back/excision  . Neoplasm    benign, colon  . Onychomycosis   . Osteoarthritis   . Osteoarthritis   . Rheumatic fever   . Rheumatic fever   . Sleep apnea   . 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(HCC)   . Venous stasis   . Venous stasis   . Vitamin B 12 deficiency     Tobacco Use: Social History   Tobacco Use  Smoking Status Former Smoker  Smokeless Tobacco Former Neurosurgeon  . Types: Chew  . Quit date: 11/29/1990    Labs: Recent Review Flowsheet Data   There is no flowsheet data to display.      Pulmonary Assessment Scores:  Pulmonary Assessment Scores    Row Name 09/27/20 1602         ADL UCSD   ADL Phase Entry     SOB Score total 35     Rest 1     Walk 1     Stairs 3     Bath 1     Dress 1     Shop 1           CAT Score   CAT Score 4           mMRC Score   mMRC Score 0            UCSD: Self-administered rating of dyspnea associated with activities of daily living (ADLs) 6-point scale (0 = "not at all" to 5 = "maximal or unable to do because of breathlessness")  Scoring Scores range from 0 to 120.  Minimally important difference is 5 units  CAT: CAT can identify the health impairment of COPD patients and is better correlated with disease progression.  CAT has a scoring range of zero to 40. The CAT score is classified into four groups of low (less than 10), medium (10 - 20), high (21-30) and very high (31-40) based on the impact level of disease on health status. A CAT score over 10 suggests significant symptoms.  A worsening CAT score could be explained by an exacerbation, poor medication adherence, poor inhaler technique, or progression of COPD or comorbid conditions.  CAT MCID is 2 points  mMRC: mMRC (Modified Medical Research Council) Dyspnea Scale is used to assess the degree of baseline functional disability in patients of respiratory disease due to dyspnea. No minimal important difference is established. A decrease in  score of 1 point or greater is considered a positive change.   Pulmonary Function Assessment:   Exercise Target Goals: Exercise Program Goal: Individual exercise prescription set using results from initial 6 min walk test and THRR while considering  patient's activity barriers and safety.   Exercise Prescription Goal: Initial exercise prescription builds to 30-45 minutes a day of aerobic activity,  2-3 days per week.  Home exercise guidelines will be given to patient during program as part of exercise prescription that the participant will acknowledge.  Education: Aerobic Exercise: - Group verbal and visual presentation on the components of exercise prescription. Introduces F.I.T.T principle from ACSM for exercise prescriptions.  Reviews F.I.T.T. principles of aerobic exercise including progression. Written material given at graduation.   Education: Resistance Exercise: - Group verbal and visual presentation on the components of exercise prescription. Introduces F.I.T.T principle from ACSM for exercise prescriptions  Reviews F.I.T.T. principles of resistance exercise including progression. Written material given at graduation. Flowsheet Row Pulmonary Rehab from 11/03/2020 in [facility] Cardiac and Pulmonary Rehab  Date 11/03/20  Educator AS  Instruction Review Code 1- Verbalizes Understanding       Education: Exercise & Equipment Safety: - Individual verbal instruction and demonstration of equipment use and safety with use of the equipment. Flowsheet Row Pulmonary Rehab from 11/03/2020 in [facility] Cardiac and Pulmonary Rehab  Date 09/27/20  Educator AS  Instruction Review Code 1- Verbalizes Understanding      Education: Exercise Physiology & General Exercise Guidelines: - Group verbal and written instruction with models to review the exercise physiology of the cardiovascular system and associated critical values. Provides general exercise guidelines with specific guidelines to those with  heart or lung disease.  Flowsheet Row Pulmonary Rehab from 11/03/2020 in [facility] Cardiac and Pulmonary Rehab  Date 10/27/20  Educator [facility]  Instruction Review Code 1- Verbalizes Understanding      Education: Flexibility, Balance, Mind/Body Relaxation: - Group verbal and visual presentation with interactive activity on the components of exercise prescription. Introduces F.I.T.T principle from ACSM for exercise prescriptions. Reviews F.I.T.T. principles of flexibility and balance exercise training including progression. Also discusses the mind body connection.  Reviews various relaxation techniques to help reduce and manage stress (i.e. Deep breathing, progressive muscle relaxation, and visualization). Balance handout provided to take home. Written material given at graduation.   Activity Barriers & Risk Stratification:  Activity Barriers & Cardiac Risk Stratification - 09/24/20 1309      Activity Barriers & Cardiac Risk Stratification   Activity Barriers Right Knee Replacement;Left Knee Replacement;Joint Problems;Muscular Weakness;Shortness of Breath;Other (comment);Arthritis    Comments Thoracic Ascending Aortic Aneurysm- keep BP below 135/80           6 Minute Walk:  6 Minute Walk    Row Name 09/27/20 1548         6 Minute Walk   Phase Initial     Distance 895 feet     Walk Time 6 minutes     # of Rest Breaks 0     MPH 1.7     METS 1.8     RPE 11     Perceived Dyspnea  0     VO2 Peak 6.26     Symptoms No     Resting HR 63 bpm     Resting BP 124/74     Resting Oxygen Saturation  95 %     Exercise Oxygen Saturation  during 6 min walk 91 %     Max Ex. HR 96 bpm     Max Ex. BP 152/84     2 Minute Post BP 124/78           Interval HR   1 Minute HR 84     2 Minute HR 88     3 Minute HR 89     4 Minute HR 94  5 Minute HR 96     6 Minute HR 94     2 Minute Post HR 82     Interval Heart Rate? Yes           Interval Oxygen   Interval Oxygen? Yes     Baseline Oxygen  Saturation % 95 %     1 Minute Oxygen Saturation % 96 %     1 Minute Liters of Oxygen 3 L     2 Minute Oxygen Saturation % 95 %     2 Minute Liters of Oxygen 3 L     3 Minute Oxygen Saturation % 92 %     3 Minute Liters of Oxygen 3 L     4 Minute Oxygen Saturation % 91 %     4 Minute Liters of Oxygen 3 L     5 Minute Oxygen Saturation % 91 %     5 Minute Liters of Oxygen 3 L     6 Minute Oxygen Saturation % 91 %     6 Minute Liters of Oxygen 3 L     2 Minute Post Oxygen Saturation % 98 %     2 Minute Post Liters of Oxygen 3 L           Oxygen Initial Assessment:  Oxygen Initial Assessment - 09/27/20 1607      Home Oxygen   Home Oxygen Device Home Concentrator;E-Tanks    Sleep Oxygen Prescription CPAP    Home Resting Oxygen Prescription Continuous    Liters per minute 3      Intervention   Short Term Goals To learn and exhibit compliance with exercise, home and travel O2 prescription;To learn and understand importance of monitoring SPO2 with pulse oximeter and demonstrate accurate use of the pulse oximeter.;To learn and understand importance of maintaining oxygen saturations>88%;To learn and demonstrate proper pursed lip breathing techniques or other breathing techniques.;To learn and demonstrate proper use of respiratory medications    Long  Term Goals Exhibits compliance with exercise, home and travel O2 prescription;Verbalizes importance of monitoring SPO2 with pulse oximeter and return demonstration;Maintenance of O2 saturations>88%;Exhibits proper breathing techniques, such as pursed lip breathing or other method taught during program session;Compliance with respiratory medication;Demonstrates proper use of MDI's           Oxygen Re-Evaluation:  Oxygen Re-Evaluation    Row Name 09/29/20 1429             Home Oxygen   Home Oxygen Device Home Concentrator;E-Tanks       Sleep Oxygen Prescription CPAP       Home Exercise Oxygen Prescription Continuous       Liters  per minute 3       Home Resting Oxygen Prescription Continuous       Liters per minute 3       Compliance with Home Oxygen Use Yes               Goals/Expected Outcomes   Short Term Goals To learn and exhibit compliance with exercise, home and travel O2 prescription;To learn and understand importance of monitoring SPO2 with pulse oximeter and demonstrate accurate use of the pulse oximeter.;To learn and understand importance of maintaining oxygen saturations>88%;To learn and demonstrate proper pursed lip breathing techniques or other breathing techniques.       Long  Term Goals Exhibits compliance with exercise, home and travel O2 prescription;Verbalizes importance of monitoring SPO2 with pulse oximeter and return demonstration;Maintenance of O2 saturations>88%;Exhibits  proper breathing techniques, such as pursed lip breathing or other method taught during program session       Comments Reviewed PLB technique with pt.  Talked about how it works and it's importance in maintaining their exercise saturations.       Goals/Expected Outcomes Short: Become more profiecient at using PLB.   Long: Become independent at using PLB.              Oxygen Discharge (Final Oxygen Re-Evaluation):  Oxygen Re-Evaluation - 09/29/20 1429      Home Oxygen   Home Oxygen Device Home Concentrator;E-Tanks    Sleep Oxygen Prescription CPAP    Home Exercise Oxygen Prescription Continuous    Liters per minute 3    Home Resting Oxygen Prescription Continuous    Liters per minute 3    Compliance with Home Oxygen Use Yes      Goals/Expected Outcomes   Short Term Goals To learn and exhibit compliance with exercise, home and travel O2 prescription;To learn and understand importance of monitoring SPO2 with pulse oximeter and demonstrate accurate use of the pulse oximeter.;To learn and understand importance of maintaining oxygen saturations>88%;To learn and demonstrate proper pursed lip breathing techniques or other  breathing techniques.    Long  Term Goals Exhibits compliance with exercise, home and travel O2 prescription;Verbalizes importance of monitoring SPO2 with pulse oximeter and return demonstration;Maintenance of O2 saturations>88%;Exhibits proper breathing techniques, such as pursed lip breathing or other method taught during program session    Comments Reviewed PLB technique with pt.  Talked about how it works and it's importance in maintaining their exercise saturations.    Goals/Expected Outcomes Short: Become more profiecient at using PLB.   Long: Become independent at using PLB.           Initial Exercise Prescription:  Initial Exercise Prescription - 09/27/20 1500      Date of Initial Exercise RX and Referring Provider   Date 09/27/20    Referring Provider Graciela Husbands      Oxygen   Oxygen Continuous    Liters 3      Treadmill   MPH 1.5    Grade 0    Minutes 15    METs 2      Recumbant Bike   Level 1    RPM 60    Minutes 15    METs 2      NuStep   Level 2    SPM 80    Minutes 15    METs 2      REL-XR   Level 1    Speed 50    Minutes 15    METs 2      Prescription Details   Frequency (times per week) 3    Duration Progress to 30 minutes of continuous aerobic without signs/symptoms of physical distress      Intensity   THRR 40-80% of Max Heartrate 97-130    Ratings of Perceived Exertion 11-15    Perceived Dyspnea 0-4      Resistance Training   Training Prescription Yes    Weight 3 lb    Reps 10-15           Perform Capillary Blood Glucose checks as needed.  Exercise Prescription Changes:  Exercise Prescription Changes    Row Name 09/27/20 1500 10/04/20 1300 10/19/20 1200 11/01/20 1600       Response to Exercise   Blood Pressure (Admit) 124/74 128/70 122/82 136/72    Blood Pressure (Exercise)  152/84 128/66 132/72 132/62    Blood Pressure (Exit) 124/78 136/74 124/70 122/60    Heart Rate (Admit) 63 bpm 70 bpm 78 bpm 60 bpm    Heart Rate (Exercise)  96 bpm 98 bpm 95 bpm 79 bpm    Heart Rate (Exit) 82 bpm 72 bpm 90 bpm 68 bpm    Oxygen Saturation (Admit) 95 % 96 % 97 % 98 %    Oxygen Saturation (Exercise) 91 % 94 % 91 % 96 %    Oxygen Saturation (Exit) 98 % 96 % 96 % 98 %    Rating of Perceived Exertion (Exercise) 11 13 12 13     Perceived Dyspnea (Exercise) 0 3 1 1     Symptoms none none none none    Comments -- second full day of exercise -- --    Duration -- Progress to 30 minutes of  aerobic without signs/symptoms of physical distress Continue with 30 min of aerobic exercise without signs/symptoms of physical distress. Continue with 30 min of aerobic exercise without signs/symptoms of physical distress.    Intensity -- THRR unchanged THRR unchanged THRR unchanged         Progression   Progression -- Continue to progress workloads to maintain intensity without signs/symptoms of physical distress. Continue to progress workloads to maintain intensity without signs/symptoms of physical distress. Continue to progress workloads to maintain intensity without signs/symptoms of physical distress.    Average METs -- 1.83 2 2.43         Resistance Training   Training Prescription -- Yes Yes Yes    Weight -- 3 lb 4 lb 4 lb    Reps -- 10-15 10-15 10-15         Interval Training   Interval Training -- No No No         Oxygen   Oxygen -- Continuous Continuous Continuous    Liters -- 3 3 3          Treadmill   MPH -- 1.5 1.2 1.5    Grade -- 0 0 0    Minutes -- 15 15 15     METs -- 2.15 2.15 2.15         Recumbant Bike   Level -- 2 -- 2    Minutes -- 15 -- 15    METs -- -- -- 2.2         NuStep   Level -- 2 -- 4    Minutes -- 15 -- 15    METs -- 1.5 -- 2.4         REL-XR   Level -- 1 1 1     Speed -- -- 50 --    Minutes -- 15 15 15     METs -- -- 1.9 3           Exercise Comments:  Exercise Comments    Row Name 09/29/20 1424           Exercise Comments First full day of exercise!  Patient was oriented to gym and  equipment including functions, settings, policies, and procedures.  Patient's individual exercise prescription and treatment plan were reviewed.  All starting workloads were established based on the results of the 6 minute walk test done at initial orientation visit.  The plan for exercise progression was also introduced and progression will be customized based on patient's performance and goals.              Exercise Goals and Review:  Exercise Goals  [place] Name 09/27/20 1555             Exercise Goals   Increase Physical Activity Yes       Intervention Provide advice, education, support and counseling about physical activity/exercise needs.;Develop an individualized exercise prescription for aerobic and resistive training based on initial evaluation findings, risk stratification, comorbidities and participant's personal goals.       Expected Outcomes Short Term: Attend rehab on a regular basis to increase amount of physical activity.;Long Term: Add in home exercise to make exercise part of routine and to increase amount of physical activity.;Long Term: Exercising regularly at least 3-5 days a week.       Increase Strength and Stamina Yes       Intervention Provide advice, education, support and counseling about physical activity/exercise needs.;Develop an individualized exercise prescription for aerobic and resistive training based on initial evaluation findings, risk stratification, comorbidities and participant's personal goals.       Expected Outcomes Short Term: Increase workloads from initial exercise prescription for resistance, speed, and METs.;Short Term: Perform resistance training exercises routinely during rehab and add in resistance training at home;Long Term: Improve cardiorespiratory fitness, muscular endurance and strength as measured by increased METs and functional capacity (6MWT)       Able to understand and use rate of perceived exertion (RPE) scale Yes       Intervention  Provide education and explanation on how to use RPE scale       Expected Outcomes Short Term: Able to use RPE daily in rehab to express subjective intensity level;Long Term:  Able to use RPE to guide intensity level when exercising independently       Able to understand and use Dyspnea scale Yes       Intervention Provide education and explanation on how to use Dyspnea scale       Expected Outcomes Short Term: Able to use Dyspnea scale daily in rehab to express subjective sense of shortness of breath during exertion;Long Term: Able to use Dyspnea scale to guide intensity level when exercising independently       Knowledge and understanding of Target Heart Rate Range (THRR) Yes       Intervention Provide education and explanation of THRR including how the numbers were predicted and where they are located for reference       Expected Outcomes Short Term: Able to state/look up THRR;Short Term: Able to use daily as guideline for intensity in rehab;Long Term: Able to use THRR to govern intensity when exercising independently       Able to check pulse independently Yes       Intervention Provide education and demonstration on how to check pulse in carotid and radial arteries.;Review the importance of being able to check your own pulse for safety during independent exercise       Expected Outcomes Short Term: Able to explain why pulse checking is important during independent exercise;Long Term: Able to check pulse independently and accurately       Understanding of Exercise Prescription Yes       Intervention Provide education, explanation, and written materials on patient's individual exercise prescription       Expected Outcomes Short Term: Able to explain program exercise prescription;Long Term: Able to explain home exercise prescription to exercise independently              Exercise Goals Re-Evaluation :  Exercise Goals Re-Evaluation    Row Name 09/29/20 1424 10/04/20 1346 10/19/20 1241 10/28/20  1358 11/01/20 1648     Exercise Goal Re-Evaluation   Exercise Goals Review Increase Physical Activity;Able to understand and use rate of perceived exertion (RPE) scale;Knowledge and understanding of Target Heart Rate Range (THRR);Understanding of Exercise Prescription;Able to understand and use Dyspnea scale;Able to check pulse independently;Increase Strength and Stamina Increase Physical Activity;Increase Strength and Stamina;Understanding of Exercise Prescription Increase Physical Activity;Increase Strength and Stamina Increase Physical Activity;Increase Strength and Stamina Increase Physical Activity;Increase Strength and Stamina;Understanding of Exercise Prescription   Comments Reviewed RPE and dyspnea scales, THR and program prescription with pt today.  Pt voiced understanding and was given a copy of goals to take home. Alexander MolaVann is off to a good start in rehab.  He has completed his frist two full days of exercise.  He even asked if he could come in a an extra day this week to make up a day for Thanksgiving.  We will continue to monitor his progress. Alexander MolaVann attends consistently.  Oxygen has styaed in the 90's with 3L continuous flow.  he has increased to 4 lb for strength training.  We will continue to monitor progress. Alexander NieceVan does walk at home for 20 min or [facility] and works on breathing exercises. Alexander NieceVan is doing well in rehab.  He is now up to level 4 on the NuStep and at 2.2 METs on the bike.  We will continue to monitor his progress.   Expected Outcomes Short: Use RPE daily to regulate intensity. Long: Follow program prescription in THR. Short: Continue to attend rehab regularly  Long: continue to follow program prescription Short:  continue to attend consistently Long :  improve overall stamina Short: continue to walk at home and monitor oxygen levels Short: Continue to increase workload on treadmill  Long; Continue to improve stamina.          Discharge Exercise Prescription (Final Exercise  Prescription Changes):  Exercise Prescription Changes - 11/01/20 1600      Response to Exercise   Blood Pressure (Admit) 136/72    Blood Pressure (Exercise) 132/62    Blood Pressure (Exit) 122/60    Heart Rate (Admit) 60 bpm    Heart Rate (Exercise) 79 bpm    Heart Rate (Exit) 68 bpm    Oxygen Saturation (Admit) 98 %    Oxygen Saturation (Exercise) 96 %    Oxygen Saturation (Exit) 98 %    Rating of Perceived Exertion (Exercise) 13    Perceived Dyspnea (Exercise) 1    Symptoms none    Duration Continue with 30 min of aerobic exercise without signs/symptoms of physical distress.    Intensity THRR unchanged      Progression   Progression Continue to progress workloads to maintain intensity without signs/symptoms of physical distress.    Average METs 2.43      Resistance Training   Training Prescription Yes    Weight 4 lb    Reps 10-15      Interval Training   Interval Training No      Oxygen   Oxygen Continuous    Liters 3      Treadmill   MPH 1.5    Grade 0    Minutes 15    METs 2.15      Recumbant Bike   Level 2    Minutes 15    METs 2.2      NuStep   Level 4    Minutes 15    METs 2.4  REL-XR   Level 1    Minutes 15    METs 3           Nutrition:  Target Goals: Understanding of nutrition guidelines, daily intake of sodium 1500mg , cholesterol 200mg , calories 30% from fat and 7% or less from saturated fats, daily to have 5 or more servings of fruits and vegetables.  Education: All About Nutrition: -Group instruction provided by verbal, written material, interactive activities, discussions, models, and posters to present general guidelines for heart healthy nutrition including fat, fiber, MyPlate, the role of sodium in heart healthy nutrition, utilization of the nutrition label, and utilization of this knowledge for meal planning. Follow up email sent as well. Written material given at graduation.   Biometrics:  Pre Biometrics - 09/27/20 1556       Pre Biometrics   Height 6' 1.5" (1.867 m)    Weight 254 lb 9.6 oz (115.5 kg)    BMI (Calculated) 33.13    Single Leg Stand 30 seconds            Nutrition Therapy Plan and Nutrition Goals:  Nutrition Therapy & Goals - 10/20/20 1419      Nutrition Therapy   Diet heart healthy, low Na, pulmonary MNT    Protein (specify units) 100g    Fiber 30 grams    Whole Grain Foods 3 servings    Saturated Fats 12 max. grams    Fruits and Vegetables 8 servings/day    Sodium 1.5 grams      Personal Nutrition Goals   Nutrition Goal ST: limit red meat to 2-3x/week LT: He would like to maintain his weight around 240-250lbs    Comments Alexander Howe reports his hemoglobin was low and his energy has been down - he just started taking iron supplements today and rpeorts he is feeling better than he did earlier today. He no longer has to take his fluid pill. His wife was a Engineer, civil (consulting) and keeps him on track. B: scrambled egg (3 whole eggs, no fat and no salt added) - he will sometimes have some honey or ketchup and water L: varied: soup (vegetbale soup with some hamburger meat that his wife makes) or banana and peanut butter sandwich or chicken salad and crackers. D: usually his biggest meal: meat and two vegetables - brocooli, green beans, butter beans and corn, peas - he salts some of his food, but started today. He reports his doctor said to eat more chicken and plant foods. Discussed heart healthy eating, getting a variety of plant foods, lower sodium MNT, and the importance of meeting his calorie and protein needs. He would like to try rotisserie chicken instead of red meat - discussed how that is high in sodium and that he shouldn't do that for every meal, but can pre-prepare some chicken breasts roasted in the oven with some vegetables for ease. three main things to work on - increased plant intake, lowered red meat intake, limited Na.      Intervention Plan   Intervention Prescribe, educate and counsel regarding  individualized specific dietary modifications aiming towards targeted core components such as weight, hypertension, lipid management, diabetes, heart failure and other comorbidities.;Nutrition handout(s) given to patient.    Expected Outcomes Short Term Goal: Understand basic principles of dietary content, such as calories, fat, sodium, cholesterol and nutrients.;Short Term Goal: A plan has been developed with personal nutrition goals set during dietitian appointment.;Long Term Goal: Adherence to prescribed nutrition plan.  Nutrition Assessments:  Nutrition Assessments - 09/27/20 1604      MEDFICTS Scores   Pre Score 24          MEDIFICTS Score Key:  ?70 Need to make dietary changes   40-70 Heart Healthy Diet  ? 40 Therapeutic Level Cholesterol Diet   Picture Your Plate Scores:  <22 Unhealthy dietary pattern with much room for improvement.  41-50 Dietary pattern unlikely to meet recommendations for good health and room for improvement.  51-60 More healthful dietary pattern, with some room for improvement.   >60 Healthy dietary pattern, although there may be some specific behaviors that could be improved.   Nutrition Goals Re-Evaluation:  Nutrition Goals Re-Evaluation    Row Name 10/28/20 1355             Goals   Comment Alexander Howe has said his wife and Dr have recommended low salt and that is how she prepares food at home.  He is also taking an iron supplement.       Expected Outcome Short:  continue recommendations from RD  Long: reach goal weight              Nutrition Goals Discharge (Final Nutrition Goals Re-Evaluation):  Nutrition Goals Re-Evaluation - 10/28/20 1355      Goals   Comment Alexander Howe has said his wife and Dr have recommended low salt and that is how she prepares food at home.  He is also taking an iron supplement.    Expected Outcome Short:  continue recommendations from RD  Long: reach goal weight           Psychosocial: Target Goals:  Acknowledge presence or absence of significant depression and/or stress, maximize coping skills, provide positive support system. Participant is able to verbalize types and ability to use techniques and skills needed for reducing stress and depression.   Education: Stress, Anxiety, and Depression - Group verbal and visual presentation to define topics covered.  Reviews how body is impacted by stress, anxiety, and depression.  Also discusses healthy ways to reduce stress and to treat/manage anxiety and depression.  Written material given at graduation. Flowsheet Row Pulmonary Rehab from 11/03/2020 in [facility] Cardiac and Pulmonary Rehab  Date 10/20/20  Educator [facility]  Instruction Review Code 1- [employer] Understanding      Education: Sleep Hygiene -Provides group verbal and written instruction about how sleep can affect your health.  Define sleep hygiene, discuss sleep cycles and impact of sleep habits. Review good sleep hygiene tips.    Initial Review & Psychosocial Screening:  Initial Psych Review & Screening - 09/24/20 1308      Initial Review   Current issues with Current Stress Concerns    Source of Stress Concerns Unable to participate in former interests or hobbies;Unable to perform yard/household activities;Chronic Illness      Family Dynamics   Good Support System? Yes   wife     Barriers   Psychosocial barriers to participate in program There are no identifiable barriers or psychosocial needs.      Screening Interventions   Interventions Encouraged to exercise;To provide support and resources with identified psychosocial needs;Provide feedback about the scores to participant    Expected Outcomes Short Term goal: Utilizing psychosocial counselor, staff and physician to assist with identification of specific Stressors or current issues interfering with healing process. Setting desired goal for each stressor or current issue identified.;Long Term Goal: Stressors or current issues are  controlled or eliminated.;Short Term goal: Identification and review  with participant of any Quality of Life or Depression concerns found by scoring the questionnaire.;Long Term goal: The participant improves quality of Life and PHQ9 Scores as seen by post scores and/or verbalization of changes           Quality of Life Scores:  Scores of 19 and below usually indicate a poorer quality of life in these areas.  A difference of  2-3 points is a clinically meaningful difference.  A difference of 2-3 points in the total score of the Quality of Life Index has been associated with significant improvement in overall quality of life, self-image, physical symptoms, and general health in studies assessing change in quality of life.  PHQ-9: Recent Review Flowsheet Data    Depression screen [facility] 2/9 09/27/2020   Decreased Interest 0   Down, Depressed, Hopeless 0   PHQ - 2 Score 0   Altered sleeping 0   Tired, decreased energy 0   Change in appetite 0   Feeling bad or failure about yourself  1   Trouble concentrating 1   Moving slowly or fidgety/restless 0   Suicidal thoughts 0   PHQ-9 Score 2   Difficult doing work/chores Not difficult at all     Interpretation of Total Score  Total Score Depression Severity:  1-4 = Minimal depression, 5-9 = Mild depression, 10-14 = Moderate depression, 15-19 = Moderately severe depression, 20-27 = Severe depression   Psychosocial Evaluation and Intervention:  Psychosocial Evaluation - 09/24/20 1324      Psychosocial Evaluation & Interventions   Comments Alexander Howe reports doing well thanks to his supportive wife who was a Engineer, civil (consulting). He had Covid in August of this year and now he is on oxygen. He states his shortness of breath has gotten better but he is looking forward to coming to pulmonary rehab to learn more and feel better. 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 It is stable and he is being followed by vascular, but he can't get his blood  pressure above 135/80s and doesn't know what all he can/can't do. He is looking forward to coming to learn more and increase his stamina.    Expected Outcomes Short: attend Pulmonary Rehab for education and exercise. Long: develop and maintain positive self care habits.    Continue Psychosocial Services  Follow up required by staff           Psychosocial Re-Evaluation:  Psychosocial Re-Evaluation    Row Name 10/28/20 1401             Psychosocial Re-Evaluation   Comments Alexander Howe has been sleeping better since starting [facility].  He also says stress is low - he has a big dog that he spends time with to lower stress.       Expected Outcomes Short: continue to exercise Long:  maintain positive outlook              Psychosocial Discharge (Final Psychosocial Re-Evaluation):  Psychosocial Re-Evaluation - 10/28/20 1401      Psychosocial Re-Evaluation   Comments Alexander Howe has been sleeping better since starting [facility].  He also says stress is low - he has a big dog that he spends time with to lower stress.    Expected Outcomes Short: continue to exercise Long:  maintain positive outlook           Education: Education Goals: Education classes will be provided on a weekly basis, covering required topics. Participant will state understanding/return demonstration of topics presented.  Learning Barriers/Preferences:  Learning Barriers/Preferences - 09/24/20 1308      Learning Barriers/Preferences   Learning Barriers None    Learning Preferences None           General Pulmonary Education Topics:  Infection Prevention: - Provides verbal and written material to individual with discussion of infection control including proper hand washing and proper equipment cleaning during exercise session. Flowsheet Row Pulmonary Rehab from 11/03/2020 in [facility] Cardiac and Pulmonary Rehab  Date 09/27/20  Educator AS  Instruction Review Code 1- Verbalizes Understanding      Falls Prevention: -  Provides verbal and written material to individual with discussion of falls prevention and safety. Flowsheet Row Pulmonary Rehab from 11/03/2020 in [facility] Cardiac and Pulmonary Rehab  Date 09/27/20  Educator AS  Instruction Review Code 1- Verbalizes Understanding      Chronic Lung Disease Review: - Group verbal instruction with posters, models, PowerPoint presentations and videos,  to review new updates, new respiratory medications, new advancements in procedures and treatments. Providing information on websites and "800" numbers for continued self-education. Includes information about supplement oxygen, available portable oxygen systems, continuous and intermittent flow rates, oxygen safety, concentrators, and Medicare reimbursement for oxygen. Explanation of Pulmonary Drugs, including class, frequency, complications, importance of spacers, rinsing mouth after steroid MDI's, and proper cleaning methods for nebulizers. Review of basic lung anatomy and physiology related to function, structure, and complications of lung disease. Review of risk factors. Discussion about methods for diagnosing sleep apnea and types of masks and machines for OSA. Includes a review of the use of types of environmental controls: home humidity, furnaces, filters, dust mite/pet prevention, HEPA vacuums. Discussion about weather changes, air quality and the benefits of nasal washing. Instruction on Warning signs, infection symptoms, calling MD promptly, preventive modes, and value of vaccinations. Review of effective airway clearance, coughing and/or vibration techniques. Emphasizing that all should Create an Action Plan. Written material given at graduation. Flowsheet Row Pulmonary Rehab from 11/03/2020 in [facility] Cardiac and Pulmonary Rehab  Date 10/13/20  Educator [facility]  Instruction Review Code 1- Verbalizes Understanding      AED/CPR: - Group verbal and written instruction with the use of models to demonstrate the basic use of  the AED with the basic ABC's of resuscitation.    Anatomy and Cardiac Procedures: - Group verbal and visual presentation and models provide information about basic cardiac anatomy and function. Reviews the testing methods done to diagnose heart disease and the outcomes of the test results. Describes the treatment choices: Medical Management, Angioplasty, or Coronary Bypass Surgery for treating various heart conditions including Myocardial Infarction, Angina, Valve Disease, and Cardiac Arrhythmias.  Written material given at graduation. Flowsheet Row Pulmonary Rehab from 11/03/2020 in [facility] Cardiac and Pulmonary Rehab  Date 11/03/20  Educator [facility]  Instruction Review Code 1- Verbalizes Understanding      Medication Safety: - Group verbal and visual instruction to review commonly prescribed medications for heart and lung disease. Reviews the medication, class of the drug, and side effects. Includes the steps to properly store meds and maintain the prescription regimen.  Written material given at graduation.   Other: -Provides group and verbal instruction on various topics (see comments)   Knowledge Questionnaire Score:  Knowledge Questionnaire Score - 09/27/20 1603      Knowledge Questionnaire Score   Pre Score 12/18 exercise oxygen            Core Components/Risk Factors/Patient Goals at Admission:  Personal Goals and Risk Factors at Admission - 09/27/20 1557  Core Components/Risk Factors/Patient Goals on Admission    Weight Management Weight Maintenance    Improve shortness of breath with ADL's Yes    Intervention Provide education, individualized exercise plan and daily activity instruction to help decrease symptoms of SOB with activities of daily living.    Expected Outcomes Short Term: Improve cardiorespiratory fitness to achieve a reduction of symptoms when performing ADLs;Long Term: Be able to perform more ADLs without symptoms or delay the onset of symptoms     Hypertension Yes    Intervention Provide education on lifestyle modifcations including regular physical activity/exercise, weight management, moderate sodium restriction and increased consumption of fresh fruit, vegetables, and low fat dairy, alcohol moderation, and smoking cessation.;Monitor prescription use compliance.    Lipids Yes    Intervention Provide education and support for participant on nutrition & aerobic/resistive exercise along with prescribed medications to achieve LDL 70mg , HDL >40mg .    Expected Outcomes Short Term: Participant states understanding of desired cholesterol values and is compliant with medications prescribed. Participant is following exercise prescription and nutrition guidelines.;Long Term: Cholesterol controlled with medications as prescribed, with individualized exercise RX and with personalized nutrition plan. Value goals: LDL < 70mg , HDL > 40 mg.           Education:Diabetes - Individual verbal and written instruction to review signs/symptoms of diabetes, desired ranges of glucose level fasting, after meals and with exercise. Acknowledge that pre and post exercise glucose checks will be done for 3 sessions at entry of program.   Know Your Numbers and Heart Failure: - Group verbal and visual instruction to discuss disease risk factors for cardiac and pulmonary disease and treatment options.  Reviews associated critical values for Overweight/Obesity, Hypertension, Cholesterol, and Diabetes.  Discusses basics of heart failure: signs/symptoms and treatments.  Introduces Heart Failure Zone chart for action plan for heart failure.  Written material given at graduation. Flowsheet Row Pulmonary Rehab from 11/03/2020 in [facility] Cardiac and Pulmonary Rehab  Date 10/06/20  Educator [facility]  Instruction Review Code 1- Verbalizes Understanding      Core Components/Risk Factors/Patient Goals Review:   Goals and Risk Factor Review    Row Name 10/28/20 1350              Core Components/Risk Factors/Patient Goals Review   Personal Goals Review Weight Management/Obesity;Hypertension;Lipids       Review Alexander Howe saw his Dr earlier in the week and his lung function has improved some since starting [facility].  He has noticed that his breathing is easier and deeper.  He is sleeping better.  He would like to get off oxygen altogether.  He reports taking medications as directed.  He does check BP at home and sends to Dr Graciela Husbands every week.       Expected Outcomes Short:  continue current regimen Long: be able to get off oxygen              Core Components/Risk Factors/Patient Goals at Discharge (Final Review):   Goals and Risk Factor Review - 10/28/20 1350      Core Components/Risk Factors/Patient Goals Review   Personal Goals Review Weight Management/Obesity;Hypertension;Lipids    Review Alexander Howe saw his Dr earlier in the week and his lung function has improved some since starting [facility].  He has noticed that his breathing is easier and deeper.  He is sleeping better.  He would like to get off oxygen altogether.  He reports taking medications as directed.  He does check BP at home and sends to  Dr Graciela Husbands every week.    Expected Outcomes Short:  continue current regimen Long: be able to get off oxygen           ITP Comments:  ITP Comments    Row Name 09/24/20 1329 09/29/20 1423 10/13/20 1059 11/10/20 0733     ITP Comments Initial telephone orientation completed. Diagnosis can be found in [facility] 11/2. EP orientation scheduled for Monday 11/15 at 1pm First full day of exercise!  Patient was oriented to gym and equipment including functions, settings, policies, and procedures.  Patient's individual exercise prescription and treatment plan were reviewed.  All starting workloads were established based on the results of the 6 minute walk test done at initial orientation visit.  The plan for exercise progression was also introduced and progression will be customized based on patient's  performance and goals. 30 Day review completed. Medical Director ITP review done, changes made as directed, and signed approval by Medical Director.  New to program 30 Day review completed. Medical Director ITP review done, changes made as directed, and signed approval by Medical Director.           Comments:

## 2020-11-11 ENCOUNTER — Other Ambulatory Visit: Payer: Self-pay

## 2020-11-11 ENCOUNTER — Encounter: Payer: Medicare Other | Admitting: *Deleted

## 2020-11-11 DIAGNOSIS — J9611 Chronic respiratory failure with hypoxia: Secondary | ICD-10-CM

## 2020-11-11 NOTE — Progress Notes (Signed)
Daily Session Note  Patient Details  Name: Alexander Howe MRN: [id number] Date of Birth: [birth year] Referring Provider:   Flowsheet Row Pulmonary Rehab from 09/27/2020 in [facility] Cardiac and Pulmonary Rehab  Referring Provider Caryl Comes      Encounter Date: 11/11/2020  Check In:  Session Check In - 11/11/20 1401      Check-In   Supervising physician immediately available to respond to emergencies See telemetry face sheet for immediately available ER MD    Location [facility]-Cardiac & Pulmonary Rehab    Staff Present Renita Papa, RN BSN;Laureen Owens Shark, BS, RRT, CPFT;Amanda Oletta Darter, BA, ACSM CEP, Exercise Physiologist    Virtual Visit No    Medication changes reported     No    Fall or balance concerns reported    No    Warm-up and Cool-down Performed on first and last piece of equipment    Resistance Training Performed Yes    VAD Patient? No    PAD/SET Patient? No      Pain Assessment   Currently in Pain? No/denies              Social History   Tobacco Use  Smoking Status Former Smoker  Smokeless Tobacco Former Systems developer  . Types: Chew  . Quit date: 11/29/1990    Goals Met:  Independence with exercise equipment Exercise tolerated well No report of cardiac concerns or symptoms Strength training completed today  Goals Unmet:  Not Applicable  Comments: Pt able to follow exercise prescription today without complaint.  Will continue to monitor for progression.    Dr. Emily Filbert is Medical Director for [place] and [facility].

## 2020-11-15 ENCOUNTER — Encounter: Payer: Medicare Other | Attending: Internal Medicine

## 2020-11-15 ENCOUNTER — Other Ambulatory Visit: Payer: Self-pay

## 2020-11-15 DIAGNOSIS — Z87891 Personal history of nicotine dependence: Secondary | ICD-10-CM | POA: Insufficient documentation

## 2020-11-15 DIAGNOSIS — J9611 Chronic respiratory failure with hypoxia: Secondary | ICD-10-CM | POA: Insufficient documentation

## 2020-11-15 NOTE — Progress Notes (Signed)
Daily Session Note  Patient Details  Name: Alexander Howe MRN: [id number] Date of Birth: 09/12/1946 Referring Provider:   Flowsheet Row Pulmonary Rehab from 09/27/2020 in [facility] Cardiac and Pulmonary Rehab  Referring Provider Caryl Comes      Encounter Date: 11/15/2020  Check In:  Session Check In - 11/15/20 1357      Check-In   Supervising physician immediately available to respond to emergencies See telemetry face sheet for immediately available ER MD    Location [facility]-Cardiac & Pulmonary Rehab    Staff Present Birdie Sons, MPA, Nino Glow, MS Exercise Physiologist    Virtual Visit No    Medication changes reported     No    Fall or balance concerns reported    No    Warm-up and Cool-down Performed on first and last piece of equipment    Resistance Training Performed Yes    VAD Patient? No    PAD/SET Patient? No      Pain Assessment   Currently in Pain? No/denies              Social History   Tobacco Use  Smoking Status Former Smoker  Smokeless Tobacco Former Systems developer  . Types: Chew  . Quit date: 11/29/1990    Goals Met:  Independence with exercise equipment Exercise tolerated well No report of cardiac concerns or symptoms Strength training completed today  Goals Unmet:  Not Applicable  Comments: Pt able to follow exercise prescription today without complaint.  Will continue to monitor for progression.    Dr. Emily Filbert is Medical Director for [place] and [facility]monary [facility].

## 2020-11-17 ENCOUNTER — Other Ambulatory Visit: Payer: Self-pay

## 2020-11-17 DIAGNOSIS — J9611 Chronic respiratory failure with hypoxia: Secondary | ICD-10-CM | POA: Diagnosis not present

## 2020-11-17 NOTE — Progress Notes (Signed)
Daily Session Note  Patient Details  Name: Alexander Howe MRN: [id number] Date of Birth: 10/12/1946 Referring Provider:   Flowsheet Row Pulmonary Rehab from 09/27/2020 in [facility] Cardiac and Pulmonary Rehab  Referring Provider Klein      Encounter Date: 11/17/2020  Check In:  Session Check In - 11/17/20 1401      Check-In   Supervising physician immediately available to respond to emergencies See telemetry face sheet for immediately available ER MD    Location [facility]-Cardiac & Pulmonary Rehab    Staff Present Kelly Bollinger, MPA, RN;Joseph Hood RCP,RRT,BSRT;Kara Langdon, MS Exercise Physiologist    Virtual Visit No    Medication changes reported     No    Fall or balance concerns reported    No    Warm-up and Cool-down Performed on first and last piece of equipment    Resistance Training Performed Yes    VAD Patient? No    PAD/SET Patient? No      Pain Assessment   Currently in Pain? No/denies              Social History   Tobacco Use  Smoking Status Former Smoker  Smokeless Tobacco Former User  . Types: Chew  . Quit date: 11/29/1990    Goals Met:  Independence with exercise equipment Exercise tolerated well No report of cardiac concerns or symptoms Strength training completed today  Goals Unmet:  Not Applicable  Comments: Pt able to follow exercise prescription today without complaint.  Will continue to monitor for progression.    Dr. Mark Miller is Medical Director for [facility] and [facility]. 

## 2020-11-18 ENCOUNTER — Other Ambulatory Visit: Payer: Self-pay

## 2020-11-18 ENCOUNTER — Encounter: Payer: Medicare Other | Admitting: *Deleted

## 2020-11-18 DIAGNOSIS — J9611 Chronic respiratory failure with hypoxia: Secondary | ICD-10-CM | POA: Diagnosis not present

## 2020-11-18 NOTE — Progress Notes (Signed)
Daily Session Note  Patient Details  Name: Alexander Howe MRN: [id number] Date of Birth: 1946/05/12 Referring Provider:   Flowsheet Row Pulmonary Rehab from 09/27/2020 in [facility] Cardiac and Pulmonary Rehab  Referring Provider Caryl Comes      Encounter Date: 11/18/2020  Check In:  Session Check In - 11/18/20 1357      Check-In   Supervising physician immediately available to respond to emergencies See telemetry face sheet for immediately available ER MD    Location [facility]-Cardiac & Pulmonary Rehab    Staff Present Heath Lark, RN, BSN, Jacklynn Bue, MS Exercise Physiologist;Joseph Tessie Fass RCP,RRT,BSRT    Virtual Visit No    Medication changes reported     No    Fall or balance concerns reported    No    Warm-up and Cool-down Performed on first and last piece of equipment    Resistance Training Performed Yes    VAD Patient? No    PAD/SET Patient? No      Pain Assessment   Currently in Pain? No/denies              Social History   Tobacco Use  Smoking Status Former Smoker  Smokeless Tobacco Former Systems developer  . Types: Chew  . Quit date: 11/29/1990    Goals Met:  Proper associated with RPD/PD & O2 Sat Independence with exercise equipment Exercise tolerated well No report of cardiac concerns or symptoms  Goals Unmet:  Not Applicable  Comments: Pt able to follow exercise prescription today without complaint.  Will continue to monitor for progression.    Dr. Emily Filbert is Medical Director for [place] and [facility].

## 2020-11-18 NOTE — Progress Notes (Signed)
Alexander Howe has been using no oxygen for a few sessions  while exercising. He is excited that he is able to do exercise without requiring oxygen. His oxygen on the treadmill will stay 88 percent and above but sometimes will fall to no lower than 85 percent. He has not gone more than 5 minutes below 85 percent. Is it ok to keep weaning his oxygen? Thank you.  Physician Response  Patient May/May not wean oxygen during exercise. If ok to wean saturation of Spo2 needs to remain above        %  Please fax response back to [phone]. Thank you.

## 2020-11-22 ENCOUNTER — Other Ambulatory Visit: Payer: Self-pay

## 2020-11-22 DIAGNOSIS — J9611 Chronic respiratory failure with hypoxia: Secondary | ICD-10-CM | POA: Diagnosis not present

## 2020-11-22 NOTE — Progress Notes (Signed)
Daily Session Note  Patient Details  Name: Alexander Howe MRN: [id number] Date of Birth: 10/08/1946 Referring Provider:   Flowsheet Row Pulmonary Rehab from 09/27/2020 in [facility] Cardiac and Pulmonary Rehab  Referring Provider Caryl Comes      Encounter Date: 11/22/2020  Check In:  Session Check In - 11/22/20 1354      Check-In   Supervising physician immediately available to respond to emergencies See telemetry face sheet for immediately available ER MD    Location [facility]-Cardiac & Pulmonary Rehab    Staff Present Birdie Sons, MPA, Mauricia Area, BS, ACSM CEP, Exercise Physiologist;Kara Eliezer Bottom, MS Exercise Physiologist    Virtual Visit No    Medication changes reported     No    Fall or balance concerns reported    No    Warm-up and Cool-down Performed on first and last piece of equipment    Resistance Training Performed Yes    VAD Patient? No    PAD/SET Patient? No      Pain Assessment   Currently in Pain? No/denies              Social History   Tobacco Use  Smoking Status Former Smoker  Smokeless Tobacco Former Systems developer  . Types: Chew  . Quit date: 11/29/1990    Goals Met:  Independence with exercise equipment Exercise tolerated well No report of cardiac concerns or symptoms Strength training completed today  Goals Unmet:  Not Applicable  Comments: Pt able to follow exercise prescription today without complaint.  Will continue to monitor for progression.    Dr. Emily Filbert is Medical Director for [place] and [facility].

## 2020-11-24 ENCOUNTER — Other Ambulatory Visit: Payer: Self-pay

## 2020-11-24 DIAGNOSIS — J9611 Chronic respiratory failure with hypoxia: Secondary | ICD-10-CM | POA: Diagnosis not present

## 2020-11-24 NOTE — Progress Notes (Signed)
Daily Session Note  Patient Details  Name: Alexander Howe MRN: [id number] Date of Birth: 12-03-1945 Referring Provider:   Flowsheet Row Pulmonary Rehab from 09/27/2020 in [facility] Cardiac and Pulmonary Rehab  Referring Provider Caryl Comes      Encounter Date: 11/24/2020  Check In:  Session Check In - 11/24/20 1349      Check-In   Supervising physician immediately available to respond to emergencies See telemetry face sheet for immediately available ER MD    Location [facility]-Cardiac & Pulmonary Rehab    Staff Present Birdie Sons, MPA, Elveria Rising, BA, ACSM CEP, Exercise Physiologist;Kara Eliezer Bottom, MS Exercise Physiologist    Virtual Visit No    Medication changes reported     No    Fall or balance concerns reported    No    Warm-up and Cool-down Performed on first and last piece of equipment    Resistance Training Performed Yes    VAD Patient? No    PAD/SET Patient? No      Pain Assessment   Currently in Pain? No/denies              Social History   Tobacco Use  Smoking Status Former Smoker  Smokeless Tobacco Former Systems developer  . Types: Chew  . Quit date: 11/29/1990    Goals Met:  Independence with exercise equipment Exercise tolerated well No report of cardiac concerns or symptoms Strength training completed today  Goals Unmet:  Not Applicable  Comments: Pt able to follow exercise prescription today without complaint.  Will continue to monitor for progression.    Dr. Emily Filbert is Medical Director for [place] and [facility].

## 2020-11-25 ENCOUNTER — Encounter: Payer: Medicare Other | Admitting: *Deleted

## 2020-11-25 DIAGNOSIS — J9611 Chronic respiratory failure with hypoxia: Secondary | ICD-10-CM

## 2020-11-25 NOTE — Progress Notes (Signed)
Daily Session Note  Patient Details  Name: Alexander Howe MRN: [id number] Date of Birth: 1946-06-23 Referring Provider:   Flowsheet Row Pulmonary Rehab from 09/27/2020 in [facility] Cardiac and Pulmonary Rehab  Referring Provider Caryl Comes      Encounter Date: 11/25/2020  Check In:  Session Check In - 11/25/20 1452      Check-In   Supervising physician immediately available to respond to emergencies See telemetry face sheet for immediately available ER MD    Location [facility]-Cardiac & Pulmonary Rehab    Staff Present Heath Lark, RN, BSN, CCRP;Joseph Hood RCP,RRT,BSRT;Jessica [place], [place], [place], [place], CCET    Virtual Visit No    Medication changes reported     No    Fall or balance concerns reported    No    Warm-up and Cool-down Performed on first and last piece of equipment    Resistance Training Performed Yes    VAD Patient? No    PAD/SET Patient? No      Pain Assessment   Currently in Pain? No/denies              Social History   Tobacco Use  Smoking Status Former Smoker  Smokeless Tobacco Former Systems developer  . Types: Chew  . Quit date: 11/29/1990    Goals Met:  Proper associated with RPD/PD & O2 Sat Independence with exercise equipment Exercise tolerated well No report of cardiac concerns or symptoms  Goals Unmet:  Not Applicable  Comments: Pt able to follow exercise prescription today without complaint.  Will continue to monitor for progression.    Dr. Emily Filbert is Medical Director for [place] and [facility].

## 2020-12-01 ENCOUNTER — Other Ambulatory Visit: Payer: Self-pay

## 2020-12-01 DIAGNOSIS — J9611 Chronic respiratory failure with hypoxia: Secondary | ICD-10-CM

## 2020-12-01 NOTE — Progress Notes (Signed)
Daily Session Note  Patient Details  Name: Alexander Howe MRN: [id number] Date of Birth: [birth year] Referring Provider:   Flowsheet Row Pulmonary Rehab from 09/27/2020 in [facility] Cardiac and Pulmonary Rehab  Referring Provider Caryl Comes      Encounter Date: 12/01/2020  Check In:  Session Check In - 12/01/20 1348      Check-In   Supervising physician immediately available to respond to emergencies See telemetry face sheet for immediately available ER MD    Location [facility]-Cardiac & Pulmonary Rehab    Staff Present Birdie Sons, MPA, RN;Joseph Lou [place], [place] Exercise Physiologist    Virtual Visit No    Medication changes reported     No    Fall or balance concerns reported    No    Warm-up and Cool-down Performed on first and last piece of equipment    Resistance Training Performed Yes    VAD Patient? No    PAD/SET Patient? No      Pain Assessment   Currently in Pain? No/denies              Social History   Tobacco Use  Smoking Status Former Smoker  Smokeless Tobacco Former Systems developer  . Types: Chew  . Quit date: 11/29/1990    Goals Met:  Independence with exercise equipment Exercise tolerated well No report of cardiac concerns or symptoms Strength training completed today  Goals Unmet:  Not Applicable  Comments: Pt able to follow exercise prescription today without complaint.  Will continue to monitor for progression.    Dr. Emily Filbert is Medical Director for [place] and [facility].

## 2020-12-02 ENCOUNTER — Encounter: Payer: Medicare Other | Admitting: *Deleted

## 2020-12-02 DIAGNOSIS — J9611 Chronic respiratory failure with hypoxia: Secondary | ICD-10-CM

## 2020-12-02 NOTE — Progress Notes (Signed)
Daily Session Note  Patient Details  Name: Alexander Howe MRN: [id number] Date of Birth: [birth year] Referring Provider:   Flowsheet Row Pulmonary Rehab from 09/27/2020 in [facility] Cardiac and Pulmonary Rehab  Referring Provider Caryl Comes      Encounter Date: 12/02/2020  Check In:  Session Check In - 12/02/20 1351      Check-In   Supervising physician immediately available to respond to emergencies See telemetry face sheet for immediately available ER MD    Location [facility]-Cardiac & Pulmonary Rehab    Staff Present Renita Papa, RN BSN;Joseph [address] [place], [place], [place], CCRP, CCET    Virtual Visit No    Medication changes reported     No    Fall or balance concerns reported    No    Warm-up and Cool-down Performed on first and last piece of equipment    Resistance Training Performed Yes    VAD Patient? No    PAD/SET Patient? No      Pain Assessment   Currently in Pain? No/denies              Social History   Tobacco Use  Smoking Status Former Smoker  Smokeless Tobacco Former Systems developer  . Types: Chew  . Quit date: 11/29/1990    Goals Met:  Independence with exercise equipment Exercise tolerated well No report of cardiac concerns or symptoms Strength training completed today  Goals Unmet:  Not Applicable  Comments: Pt able to follow exercise prescription today without complaint.  Will continue to monitor for progression.    Dr. Emily Filbert is Medical Director for [place] and [facility].

## 2020-12-06 ENCOUNTER — Other Ambulatory Visit: Payer: Self-pay

## 2020-12-06 DIAGNOSIS — J9611 Chronic respiratory failure with hypoxia: Secondary | ICD-10-CM

## 2020-12-06 NOTE — Progress Notes (Signed)
Daily Session Note  Patient Details  Name: ADEBAYO ENSMINGER MRN: [id number] Date of Birth: 1946/08/23 Referring Provider:   Flowsheet Row Pulmonary Rehab from 09/27/2020 in [facility] Cardiac and Pulmonary Rehab  Referring Provider Caryl Comes      Encounter Date: 12/06/2020  Check In:  Session Check In - 12/06/20 1411      Check-In   Supervising physician immediately available to respond to emergencies See telemetry face sheet for immediately available ER MD    Location [facility]-Cardiac & Pulmonary Rehab    Staff Present Earlean Shawl, BS, ACSM CEP, Exercise Physiologist;Kara Eliezer Bottom, MS Exercise Physiologist;Antonino Nienhuis Rosalia Hammers, MPA, RN    Virtual Visit No    Medication changes reported     No    Fall or balance concerns reported    No    Warm-up and Cool-down Performed on first and last piece of equipment    Resistance Training Performed Yes    VAD Patient? No    PAD/SET Patient? No      Pain Assessment   Currently in Pain? No/denies              Social History   Tobacco Use  Smoking Status Former Smoker  Smokeless Tobacco Former Systems developer  . Types: Chew  . Quit date: 11/29/1990    Goals Met:  Independence with exercise equipment Exercise tolerated well No report of cardiac concerns or symptoms Strength training completed today  Goals Unmet:  Not Applicable  Comments: Pt able to follow exercise prescription today without complaint.  Will continue to monitor for progression.    Dr. Emily Filbert is Medical Director for [place] and [facility].

## 2020-12-08 ENCOUNTER — Encounter: Payer: Self-pay | Admitting: *Deleted

## 2020-12-08 ENCOUNTER — Other Ambulatory Visit: Payer: Self-pay

## 2020-12-08 DIAGNOSIS — J9611 Chronic respiratory failure with hypoxia: Secondary | ICD-10-CM

## 2020-12-08 NOTE — Progress Notes (Signed)
Pulmonary Individual Treatment Plan  Patient Details  Name: Alexander Howe MRN: [id number] Date of Birth: Nov 19, 1945 Referring Provider:   Flowsheet Row Pulmonary Rehab from 09/27/2020 in [facility] Cardiac and Pulmonary Rehab  Referring Provider Caryl Comes      Initial Encounter Date:  Flowsheet Row Pulmonary Rehab from 09/27/2020 in [facility] Cardiac and Pulmonary Rehab  Date 09/27/20      Visit Diagnosis: Chronic respiratory failure with hypoxia ([place])  Patient's Home Medications on Admission:  Current Outpatient Medications:  .  albuterol (VENTOLIN HFA) 108 (90 Base) MCG/ACT inhaler, SMARTSIG:2 Inhalation Via Inhaler Every 6 Hours PRN, Disp: , Rfl:  .  ALPRAZolam (XANAX) 0.5 MG tablet, Take 0.5 mg by mouth 2 (two) times daily as needed for anxiety., Disp: , Rfl:  .  amLODipine (NORVASC) 5 MG tablet, Take 5 mg by mouth daily., Disp: , Rfl:  .  aspirin EC 81 MG tablet, Take 81 mg by mouth daily., Disp: , Rfl:  .  atorvastatin (LIPITOR) 80 MG tablet, Take 80 mg by mouth daily., Disp: , Rfl:  .  buPROPion (ZYBAN) 150 MG 12 hr tablet, Take 150 mg by mouth 2 (two) times daily. (Patient not taking: Reported on 09/24/2020), Disp: , Rfl:  .  Cholecalciferol (VITAMIN D3) 2000 units TABS, Take 1 tablet by mouth daily., Disp: , Rfl:  .  docusate sodium (COLACE) 100 MG capsule, Take 200 mg by mouth daily. (Patient not taking: Reported on 09/24/2020), Disp: , Rfl:  .  enoxaparin (LOVENOX) 40 MG/0.4ML injection, Inject 0.4 mLs (40 mg total) into the skin daily. (Patient not taking: Reported on 09/24/2020), Disp: 14 Syringe, Rfl: 0 .  furosemide (LASIX) 20 MG tablet, Take 20 mg by mouth daily as needed. (Patient not taking: Reported on 09/24/2020), Disp: , Rfl:  .  hydrochlorothiazide (HYDRODIURIL) 25 MG tablet, Take 25 mg by mouth daily., Disp: , Rfl:  .  magnesium oxide (MAG-OX) 400 MG tablet, Take 400 mg by mouth daily. (Patient not taking: Reported on 10/13/2020), Disp: , Rfl:  .  metoprolol succinate  (TOPROL-XL) 100 MG 24 hr tablet, Take 100 mg by mouth at bedtime. Take with or immediately following a meal., Disp: , Rfl:  .  Omega-3 Fatty Acids (FISH OIL CONCENTRATE PO), Take 1 capsule by mouth daily., Disp: , Rfl:  .  oxyCODONE (OXY IR/ROXICODONE) 5 MG immediate release tablet, Take 1-2 tablets (5-10 mg total) by mouth every 4 (four) hours as needed for severe pain or breakthrough pain. (Patient not taking: Reported on 09/24/2020), Disp: 80 tablet, Rfl: 0 .  pantoprazole (PROTONIX) 40 MG tablet, Take 40 mg by mouth daily., Disp: , Rfl:  .  PARoxetine (PAXIL) 10 MG tablet, Take 10 mg by mouth daily., Disp: , Rfl:  .  potassium chloride SA (K-DUR,KLOR-CON) 20 MEQ tablet, Take 20 mEq by mouth daily., Disp: , Rfl:  .  predniSONE (DELTASONE) 20 MG tablet, Take 20 mg by mouth daily. (Patient not taking: Reported on 10/20/2020), Disp: , Rfl:  .  quinapril (ACCUPRIL) 40 MG tablet, Take 40 mg by mouth daily., Disp: , Rfl:  .  sulfamethoxazole-trimethoprim (BACTRIM) 400-80 MG tablet, SMARTSIG:1 Tablet(s) By Mouth Every 12 Hours (Patient not taking: Reported on 10/20/2020), Disp: , Rfl:  .  tamsulosin (FLOMAX) 0.4 MG CAPS capsule, Take 0.4 mg by mouth daily. (Patient not taking: Reported on 10/20/2020), Disp: , Rfl:  .  traMADol (ULTRAM) 50 MG tablet, Take 1-2 tablets (50-100 mg total) by mouth every 4 (four) hours as needed for moderate  pain. (Patient not taking: Reported on 09/24/2020), Disp: 60 tablet, Rfl: 1  Past Medical History: Past Medical History:  Diagnosis Date  . AAA (abdominal aortic aneurysm) without rupture ([place])   . Abdominal aneurysm (H[facility])    3.5 cm  . Actinic keratosis 08/26/2020   right lateral chest  . Anxiety   . Basal cell carcinoma 09/18/2018   left upper back/one excised, one superficial  . Basal cell carcinoma 11/28/2018   left post shoulder  . Basal cell carcinoma 08/26/2020   left medial pretibial  . Chronic kidney disease    stage 3 / kidney stones  . Colon polyp   .  Depression   . Diverticulitis   . Diverticulosis   . Hyperlipemia   . Hypertension   . Lipoma   . Melanoma ([place]) 05/08/2018   in situ at right upper back/excision  . Neoplasm    benign, colon  . Onychomycosis   . Osteoarthritis   . Osteoarthritis   . Rheumatic fever   . Rheumatic fever   . Sleep apnea   . Squamous cell carcinoma of skin 10/13/2020   right lateral chest  . 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([place])   . Venous stasis   . Venous stasis   . Vitamin B 12 deficiency     Tobacco Use: Social History   Tobacco Use  Smoking Status Former Smoker  Smokeless Tobacco Former Systems developer  . Types: Chew  . Quit date: 11/29/1990    Labs: Recent Review Flowsheet Data   There is no flowsheet data to display.      Pulmonary Assessment Scores:  Pulmonary Assessment Scores    Row Name 09/27/20 1602         ADL UCSD   ADL Phase Entry     SOB Score total 35     Rest 1     Walk 1     Stairs 3     Bath 1     Dress 1     Shop 1           CAT Score   CAT Score 4           mMRC Score   mMRC Score 0            UCSD: Self-administered rating of dyspnea associated with activities of daily living (ADLs) 6-point scale (0 = "not at all" to 5 = "maximal or unable to do because of breathlessness")  Scoring Scores range from 0 to 120.  Minimally important difference is 5 units  CAT: CAT can identify the health impairment of COPD patients and is better correlated with disease progression.  CAT has a scoring range of zero to 40. The CAT score is classified into four groups of low (less than 10), medium (10 - 20), high (21-30) and very high (31-40) based on the impact level of disease on health status. A CAT score over 10 suggests significant symptoms.  A worsening CAT score could be explained by an exacerbation, poor medication adherence, poor inhaler technique, or progression of COPD or comorbid conditions.  CAT MCID is 2 points  mMRC: mMRC (Modified Medical Research Council) Dyspnea  Scale is used to assess the degree of baseline functional disability in patients of respiratory disease due to dyspnea. No minimal important difference is established. A decrease in score of 1 point or greater is considered a positive change.   Pulmonary Function Assessment:   Exercise Target Goals: Exercise Program Goal: Individual exercise prescription set using  results from initial 6 min walk test and THRR while considering  patient's activity barriers and safety.   Exercise Prescription Goal: Initial exercise prescription builds to 30-45 minutes a day of aerobic activity, 2-3 days per week.  Home exercise guidelines will be given to patient during program as part of exercise prescription that the participant will acknowledge.  Education: Aerobic Exercise: - Group verbal and visual presentation on the components of exercise prescription. Introduces F.I.T.T principle from ACSM for exercise prescriptions.  Reviews F.I.T.T. principles of aerobic exercise including progression. Written material given at graduation.   Education: Resistance Exercise: - Group verbal and visual presentation on the components of exercise prescription. Introduces F.I.T.T principle from ACSM for exercise prescriptions  Reviews F.I.T.T. principles of resistance exercise including progression. Written material given at graduation. Flowsheet Row Pulmonary Rehab from 11/24/2020 in [facility] Cardiac and Pulmonary Rehab  Date 11/03/20  Educator AS  Instruction Review Code 1- Verbalizes Understanding       Education: Exercise & Equipment Safety: - Individual verbal instruction and demonstration of equipment use and safety with use of the equipment. Flowsheet Row Pulmonary Rehab from 11/24/2020 in [facility] Cardiac and Pulmonary Rehab  Date 09/27/20  Educator AS  Instruction Review Code 1- Verbalizes Understanding      Education: Exercise Physiology & General Exercise Guidelines: - Group verbal and written instruction with  models to review the exercise physiology of the cardiovascular system and associated critical values. Provides general exercise guidelines with specific guidelines to those with heart or lung disease.  Flowsheet Row Pulmonary Rehab from 11/24/2020 in [facility] Cardiac and Pulmonary Rehab  Date 10/27/20  Educator [facility]  Instruction Review Code 1- Verbalizes Understanding      Education: Flexibility, Balance, Mind/Body Relaxation: - Group verbal and visual presentation with interactive activity on the components of exercise prescription. Introduces F.I.T.T principle from ACSM for exercise prescriptions. Reviews F.I.T.T. principles of flexibility and balance exercise training including progression. Also discusses the mind body connection.  Reviews various relaxation techniques to help reduce and manage stress (i.e. Deep breathing, progressive muscle relaxation, and visualization). Balance handout provided to take home. Written material given at graduation.   Activity Barriers & Risk Stratification:  Activity Barriers & Cardiac Risk Stratification - 09/24/20 1309      Activity Barriers & Cardiac Risk Stratification   Activity Barriers Right Knee Replacement;Left Knee Replacement;Joint Problems;Muscular Weakness;Shortness of Breath;Other (comment);Arthritis    Comments Thoracic Ascending Aortic Aneurysm- keep BP below 135/80           6 Minute Walk:  6 Minute Walk    Row Name 09/27/20 1548         6 Minute Walk   Phase Initial     Distance 895 feet     Walk Time 6 minutes     # of Rest Breaks 0     MPH 1.7     METS 1.8     RPE 11     Perceived Dyspnea  0     VO2 Peak 6.26     Symptoms No     Resting HR 63 bpm     Resting BP 124/74     Resting Oxygen Saturation  95 %     Exercise Oxygen Saturation  during 6 min walk 91 %     Max Ex. HR 96 bpm     Max Ex. BP 152/84     2 Minute Post BP 124/78           Interval HR  1 Minute HR 84     2 Minute HR 88     3 Minute HR 89     4  Minute HR 94     5 Minute HR 96     6 Minute HR 94     2 Minute Post HR 82     Interval Heart Rate? Yes           Interval Oxygen   Interval Oxygen? Yes     Baseline Oxygen Saturation % 95 %     1 Minute Oxygen Saturation % 96 %     1 Minute Liters of Oxygen 3 L     2 Minute Oxygen Saturation % 95 %     2 Minute Liters of Oxygen 3 L     3 Minute Oxygen Saturation % 92 %     3 Minute Liters of Oxygen 3 L     4 Minute Oxygen Saturation % 91 %     4 Minute Liters of Oxygen 3 L     5 Minute Oxygen Saturation % 91 %     5 Minute Liters of Oxygen 3 L     6 Minute Oxygen Saturation % 91 %     6 Minute Liters of Oxygen 3 L     2 Minute Post Oxygen Saturation % 98 %     2 Minute Post Liters of Oxygen 3 L           Oxygen Initial Assessment:  Oxygen Initial Assessment - 09/27/20 1607      Home Oxygen   Home Oxygen Device Home Concentrator;E-Tanks    Sleep Oxygen Prescription CPAP    Home Resting Oxygen Prescription Continuous    Liters per minute 3      Intervention   Short Term Goals To learn and exhibit compliance with exercise, home and travel O2 prescription;To learn and understand importance of monitoring SPO2 with pulse oximeter and demonstrate accurate use of the pulse oximeter.;To learn and understand importance of maintaining oxygen saturations>88%;To learn and demonstrate proper pursed lip breathing techniques or other breathing techniques.;To learn and demonstrate proper use of respiratory medications    Long  Term Goals Exhibits compliance with exercise, home and travel O2 prescription;Verbalizes importance of monitoring SPO2 with pulse oximeter and return demonstration;Maintenance of O2 saturations>88%;Exhibits proper breathing techniques, such as pursed lip breathing or other method taught during program session;Compliance with respiratory medication;Demonstrates proper use of MDI's           Oxygen Re-Evaluation:  Oxygen Re-Evaluation    Row Name 09/29/20 1429  11/18/20 1412           Program Oxygen Prescription   Program Oxygen Prescription -- Continuous      Liters per minute -- 2      Comments -- Lucianne Lei has been weaning off oxygen while doing exercise.             Home Oxygen   Home Oxygen Device Home Concentrator;E-Tanks Home Concentrator;E-Tanks      Sleep Oxygen Prescription CPAP CPAP      Liters per minute -- 2      Home Exercise Oxygen Prescription Continuous Continuous      Liters per minute 3 2  as needed      Home Resting Oxygen Prescription Continuous Continuous      Liters per minute 3 0  as needed      Compliance with Home Oxygen Use Yes Yes  Goals/Expected Outcomes   Short Term Goals To learn and exhibit compliance with exercise, home and travel O2 prescription;To learn and understand importance of monitoring SPO2 with pulse oximeter and demonstrate accurate use of the pulse oximeter.;To learn and understand importance of maintaining oxygen saturations>88%;To learn and demonstrate proper pursed lip breathing techniques or other breathing techniques. To learn and understand importance of maintaining oxygen saturations>88%;To learn and understand importance of monitoring SPO2 with pulse oximeter and demonstrate accurate use of the pulse oximeter.      Long  Term Goals Exhibits compliance with exercise, home and travel O2 prescription;Verbalizes importance of monitoring SPO2 with pulse oximeter and return demonstration;Maintenance of O2 saturations>88%;Exhibits proper breathing techniques, such as pursed lip breathing or other method taught during program session Maintenance of O2 saturations>88%;Verbalizes importance of monitoring SPO2 with pulse oximeter and return demonstration      Comments Reviewed PLB technique with pt.  Talked about how it works and it's importance in maintaining their exercise saturations. Lucianne Lei has been using no oxygen for a few sessions now while exercising. He is excited that he is able to do exercise  without requiring oxygen. His oxygen on the treadmill will stay 88 percent and above but sometimes will fall to no lower than 85 percent. He has not gone more than 5 minutes below 85 percent.      Goals/Expected Outcomes Short: Become more profiecient at using PLB.   Long: Become independent at using PLB. Short: continue to wean oxygen as tolerated. Long: get off of oxygen and monitor spo2 independently.             Oxygen Discharge (Final Oxygen Re-Evaluation):  Oxygen Re-Evaluation - 11/18/20 1412      Program Oxygen Prescription   Program Oxygen Prescription Continuous    Liters per minute 2    Comments Lucianne Lei has been weaning off oxygen while doing exercise.      Home Oxygen   Home Oxygen Device Home Concentrator;E-Tanks    Sleep Oxygen Prescription CPAP    Liters per minute 2    Home Exercise Oxygen Prescription Continuous    Liters per minute 2   as needed   Home Resting Oxygen Prescription Continuous    Liters per minute 0   as needed   Compliance with Home Oxygen Use Yes      Goals/Expected Outcomes   Short Term Goals To learn and understand importance of maintaining oxygen saturations>88%;To learn and understand importance of monitoring SPO2 with pulse oximeter and demonstrate accurate use of the pulse oximeter.    Long  Term Goals Maintenance of O2 saturations>88%;Verbalizes importance of monitoring SPO2 with pulse oximeter and return demonstration    Comments Lucianne Lei has been using no oxygen for a few sessions now while exercising. He is excited that he is able to do exercise without requiring oxygen. His oxygen on the treadmill will stay 88 percent and above but sometimes will fall to no lower than 85 percent. He has not gone more than 5 minutes below 85 percent.    Goals/Expected Outcomes Short: continue to wean oxygen as tolerated. Long: get off of oxygen and monitor spo2 independently.           Initial Exercise Prescription:  Initial Exercise Prescription - 09/27/20 1500       Date of Initial Exercise RX and Referring Provider   Date 09/27/20    Referring Provider Caryl Comes      Oxygen   Oxygen Continuous    Liters 3  Treadmill   MPH 1.5    Grade 0    Minutes 15    METs 2      Recumbant Bike   Level 1    RPM 60    Minutes 15    METs 2      NuStep   Level 2    SPM 80    Minutes 15    METs 2      REL-XR   Level 1    Speed 50    Minutes 15    METs 2      Prescription Details   Frequency (times per week) 3    Duration Progress to 30 minutes of continuous aerobic without signs/symptoms of physical distress      Intensity   THRR 40-80% of Max Heartrate 97-130    Ratings of Perceived Exertion 11-15    Perceived Dyspnea 0-4      Resistance Training   Training Prescription Yes    Weight 3 lb    Reps 10-15           Perform Capillary Blood Glucose checks as needed.  Exercise Prescription Changes:  Exercise Prescription Changes    Row Name 09/27/20 1500 10/04/20 1300 10/19/20 1200 11/01/20 1600 11/15/20 1200     Response to Exercise   Blood Pressure (Admit) 124/74 128/70 122/82 136/72 122/62   Blood Pressure (Exercise) 152/84 128/66 132/72 132/62 140/72   Blood Pressure (Exit) 124/78 136/74 124/70 122/60 118/70   Heart Rate (Admit) 63 bpm 70 bpm 78 bpm 60 bpm 72 bpm   Heart Rate (Exercise) 96 bpm 98 bpm 95 bpm 79 bpm 85 bpm   Heart Rate (Exit) 82 bpm 72 bpm 90 bpm 68 bpm 75 bpm   Oxygen Saturation (Admit) 95 % 96 % 97 % 98 % 94 %   Oxygen Saturation (Exercise) 91 % 94 % 91 % 96 % 90 %   Oxygen Saturation (Exit) 98 % 96 % 96 % 98 % 94 %   Rating of Perceived Exertion (Exercise) _0 Perceived Dyspnea (Exercise) 0 _1 Symptoms _2    Comments -- second full day of exercise -- -- --   Duration -- Progress to 30 minutes of  aerobic without signs/symptoms of physical distress Continue with 30 min of aerobic exercise without signs/symptoms of physical distress. Continue with 30 min of aerobic  exercise without signs/symptoms of physical distress. Continue with 30 min of aerobic exercise without signs/symptoms of physical distress.   Intensity -- THRR unchanged THRR unchanged THRR unchanged THRR unchanged     Progression   Progression -- Continue to progress workloads to maintain intensity without signs/symptoms of physical distress. Continue to progress workloads to maintain intensity without signs/symptoms of physical distress. Continue to progress workloads to maintain intensity without signs/symptoms of physical distress. Continue to progress workloads to maintain intensity without signs/symptoms of physical distress.   Average METs -- 1.83 2 2.43 2.3     Resistance Training   Training Prescription -- Yes Yes Yes Yes   Weight -- 3 lb 4 lb 4 lb 4 lb   Reps -- 10-15 10-15 10-15 10-15     Interval Training   Interval Training -- No No No No     Oxygen   Oxygen -- Continuous Continuous Continuous Continuous   Liters -- _3 0  tried exercise without 02 today     Treadmill  MPH -- 1.5 1.2 1.5 --   Grade -- 0 0 0 --   Minutes -- _0 --   METs -- 2.15 2.15 2.15 --     Recumbant Bike   Level -- 2 -- 2 2   Minutes -- 15 -- 15 15   METs -- -- -- 2.2 2.68     NuStep   Level -- 2 -- 4 4   Minutes -- 15 -- 15 15   METs -- 1.5 -- 2.4 1.9     REL-XR   Level -- _1 --   Speed -- -- 50 -- --   Minutes -- _2 --   METs -- -- 1.9 3 --   Row Name 11/24/20 1500 11/29/20 1100           Response to Exercise   Blood Pressure (Admit) -- 136/70      Blood Pressure (Exercise) -- 146/74      Blood Pressure (Exit) -- 118/70      Heart Rate (Admit) -- 74 bpm      Heart Rate (Exercise) -- 79 bpm      Heart Rate (Exit) -- 67 bpm      Oxygen Saturation (Admit) -- 90 %      Oxygen Saturation (Exercise) -- 93 %      Oxygen Saturation (Exit) -- 96 %      Rating of Perceived Exertion (Exercise) -- 13      Perceived Dyspnea (Exercise) -- 1      Symptoms -- none       Comments -- exercising on room air      Duration -- Continue with 30 min of aerobic exercise without signs/symptoms of physical distress.      Intensity -- THRR unchanged             Progression   Progression -- Continue to progress workloads to maintain intensity without signs/symptoms of physical distress.      Average METs -- 2.02             Resistance Training   Training Prescription -- Yes      Weight -- 5 lb      Reps -- 10-15             Interval Training   Interval Training -- No             Oxygen   Oxygen -- --  Been on Room Air since 11/08/20 for exercise             Treadmill   MPH -- 1.5      Grade -- 0      Minutes -- 15      METs -- 2.15             Recumbant Bike   Level -- 3      Minutes -- 15             NuStep   Level -- 4      Minutes -- 15      METs -- 1.5             REL-XR   Level -- 3      Minutes -- 15      METs -- 2.9             Home Exercise Plan   Plans to continue exercise at Home (comment)  Walking Home (comment)  Walking  Frequency Add 2 additional days to program exercise sessions. Add 2 additional days to program exercise sessions.      Initial Home Exercises Provided 11/24/20 11/24/20             Exercise Comments:  Exercise Comments    Row Name 09/29/20 1424           Exercise Comments First full day of exercise!  Patient was oriented to gym and equipment including functions, settings, policies, and procedures.  Patient's individual exercise prescription and treatment plan were reviewed.  All starting workloads were established based on the results of the 6 minute walk test done at initial orientation visit.  The plan for exercise progression was also introduced and progression will be customized based on patient's performance and goals.              Exercise Goals and Review:  Exercise Goals    Row Name 09/27/20 1555             Exercise Goals   Increase Physical Activity Yes       Intervention  Provide advice, education, support and counseling about physical activity/exercise needs.;Develop an individualized exercise prescription for aerobic and resistive training based on initial evaluation findings, risk stratification, comorbidities and participant's personal goals.       Expected Outcomes Short Term: Attend rehab on a regular basis to increase amount of physical activity.;Long Term: Add in home exercise to make exercise part of routine and to increase amount of physical activity.;Long Term: Exercising regularly at least 3-5 days a week.       Increase Strength and Stamina Yes       Intervention Provide advice, education, support and counseling about physical activity/exercise needs.;Develop an individualized exercise prescription for aerobic and resistive training based on initial evaluation findings, risk stratification, comorbidities and participant's personal goals.       Expected Outcomes Short Term: Increase workloads from initial exercise prescription for resistance, speed, and METs.;Short Term: Perform resistance training exercises routinely during rehab and add in resistance training at home;Long Term: Improve cardiorespiratory fitness, muscular endurance and strength as measured by increased METs and functional capacity (6MWT)       Able to understand and use rate of perceived exertion (RPE) scale Yes       Intervention Provide education and explanation on how to use RPE scale       Expected Outcomes Short Term: Able to use RPE daily in rehab to express subjective intensity level;Long Term:  Able to use RPE to guide intensity level when exercising independently       Able to understand and use Dyspnea scale Yes       Intervention Provide education and explanation on how to use Dyspnea scale       Expected Outcomes Short Term: Able to use Dyspnea scale daily in rehab to express subjective sense of shortness of breath during exertion;Long Term: Able to use Dyspnea scale to guide  intensity level when exercising independently       Knowledge and understanding of Target Heart Rate Range (THRR) Yes       Intervention Provide education and explanation of THRR including how the numbers were predicted and where they are located for reference       Expected Outcomes Short Term: Able to state/look up THRR;Short Term: Able to use daily as guideline for intensity in rehab;Long Term: Able to use THRR to govern intensity when exercising independently       Able  to check pulse independently Yes       Intervention Provide education and demonstration on how to check pulse in carotid and radial arteries.;Review the importance of being able to check your own pulse for safety during independent exercise       Expected Outcomes Short Term: Able to explain why pulse checking is important during independent exercise;Long Term: Able to check pulse independently and accurately       Understanding of Exercise Prescription Yes       Intervention Provide education, explanation, and written materials on patient's individual exercise prescription       Expected Outcomes Short Term: Able to explain program exercise prescription;Long Term: Able to explain home exercise prescription to exercise independently              Exercise Goals Re-Evaluation :  Exercise Goals Re-Evaluation    Row Name 09/29/20 1424 10/04/20 1346 10/19/20 1241 10/28/20 1358 11/01/20 1648     Exercise Goal Re-Evaluation   Exercise Goals Review Increase Physical Activity;Able to understand and use rate of perceived exertion (RPE) scale;Knowledge and understanding of Target Heart Rate Range (THRR);Understanding of Exercise Prescription;Able to understand and use Dyspnea scale;Able to check pulse independently;Increase Strength and Stamina Increase Physical Activity;Increase Strength and Stamina;Understanding of Exercise Prescription Increase Physical Activity;Increase Strength and Stamina Increase Physical Activity;Increase  Strength and Stamina Increase Physical Activity;Increase Strength and Stamina;Understanding of Exercise Prescription   Comments Reviewed RPE and dyspnea scales, THR and program prescription with pt today.  Pt voiced understanding and was given a copy of goals to take home. Eliseo Squires is off to a good start in rehab.  He has completed his frist two full days of exercise.  He even asked if he could come in a an extra day this week to make up a day for Thanksgiving.  We will continue to monitor his progress. Eliseo Squires attends consistently.  Oxygen has styaed in the 90's with 3L continuous flow.  he has increased to 4 lb for strength training.  We will continue to monitor progress. Lucianne Lei does walk at home for 20 min or more on days not at [facility] and works on breathing exercises. Lucianne Lei is doing well in rehab.  He is now up to level 4 on the NuStep and at 2.2 METs on the bike.  We will continue to monitor his progress.   Expected Outcomes Short: Use RPE daily to regulate intensity. Long: Follow program prescription in THR. Short: Continue to attend rehab regularly  Long: continue to follow program prescription Short:  continue to attend consistently Long :  improve overall stamina Short: continue to walk at home and monitor oxygen levels Short: Continue to increase workload on treadmill  Long; Continue to improve stamina.   [place] Name 11/15/20 1237 11/18/20 1406 11/24/20 1514 11/29/20 1132       Exercise Goal Re-Evaluation   Exercise Goals Review -- Increase Physical Activity;Increase Strength and Stamina;Understanding of Exercise Prescription -- Increase Physical Activity;Increase Strength and Stamina;Understanding of Exercise Prescription    Comments Lucianne Lei has tried some exercsie without oxygen supplement.  His oxygen levels stayed above 90 on seated machines.  We will monitor progress. Lucianne Lei has been doing great. He is walking at home, both inside and outside- he has a treadmill he can use at home. He walks for about 25 minutes at a  time once-twice/day. He finds it tolerable the 2nd time around but a little more fatigued. He is aware of the weather restrictions. Monitors oxygen at home and  knows he cannot fall below 88%. EP will go over home exercise guidelines with patient in the next week. Reviewed home exercise with pt today.  Pt plans to walk for exercise.  Reviewed THR, pulse, RPE, sign and symptoms, pulse oximetery and when to call 911 or MD.  Also discussed weather considerations and indoor options.  Pt voiced understanding. Lucianne Lei is doing well in rehab.  He is now up to 5 lb handweights.  We will continue to monitor his progress.    Expected Outcomes Short: continue to attend conistently Long: improve overall MET level Short: Go over home exercise Long: Obtain ability to exercise independently at home with no complications -- Short: start to add in more walking at home Long: Continue to improve stamina           Discharge Exercise Prescription (Final Exercise Prescription Changes):  Exercise Prescription Changes - 11/29/20 1100      Response to Exercise   Blood Pressure (Admit) 136/70    Blood Pressure (Exercise) 146/74    Blood Pressure (Exit) 118/70    Heart Rate (Admit) 74 bpm    Heart Rate (Exercise) 79 bpm    Heart Rate (Exit) 67 bpm    Oxygen Saturation (Admit) 90 %    Oxygen Saturation (Exercise) 93 %    Oxygen Saturation (Exit) 96 %    Rating of Perceived Exertion (Exercise) 13    Perceived Dyspnea (Exercise) 1    Symptoms none    Comments exercising on room air    Duration Continue with 30 min of aerobic exercise without signs/symptoms of physical distress.    Intensity THRR unchanged      Progression   Progression Continue to progress workloads to maintain intensity without signs/symptoms of physical distress.    Average METs 2.02      Resistance Training   Training Prescription Yes    Weight 5 lb    Reps 10-15      Interval Training   Interval Training No      Oxygen   Oxygen --   Been on  Room Air since 11/08/20 for exercise     Treadmill   MPH 1.5    Grade 0    Minutes 15    METs 2.15      Recumbant Bike   Level 3    Minutes 15      NuStep   Level 4    Minutes 15    METs 1.5      REL-XR   Level 3    Minutes 15    METs 2.9      Home Exercise Plan   Plans to continue exercise at Home (comment)   Walking   Frequency Add 2 additional days to program exercise sessions.    Initial Home Exercises Provided 11/24/20           Nutrition:  Target Goals: Understanding of nutrition guidelines, daily intake of sodium '1500mg'$ , cholesterol '200mg'$ , calories 30% from fat and 7% or less from saturated fats, daily to have 5 or more servings of fruits and vegetables.  Education: All About Nutrition: -Group instruction provided by verbal, written material, interactive activities, discussions, models, and posters to present general guidelines for heart healthy nutrition including fat, fiber, MyPlate, the role of sodium in heart healthy nutrition, utilization of the nutrition label, and utilization of this knowledge for meal planning. Follow up email sent as well. Written material given at graduation. Flowsheet Row Pulmonary Rehab from 11/24/2020 in [facility]  Cardiac and Pulmonary Rehab  Date 11/24/20  Educator [facility]  Instruction Review Code 1- Verbalizes Understanding      Biometrics:  Pre Biometrics - 09/27/20 1556      Pre Biometrics   Height 6' 1.5" (1.867 m)    Weight 254 lb 9.6 oz (115.5 kg)    BMI (Calculated) 33.13    Single Leg Stand 30 seconds            Nutrition Therapy Plan and Nutrition Goals:  Nutrition Therapy & Goals - 10/20/20 1419      Nutrition Therapy   Diet heart healthy, low Na, pulmonary MNT    Protein (specify units) 100g    Fiber 30 grams    Whole Grain Foods 3 servings    Saturated Fats 12 max. grams    Fruits and Vegetables 8 servings/day    Sodium 1.5 grams      Personal Nutrition Goals   Nutrition Goal ST: limit red meat to 2-3x/week  LT: He would like to maintain his weight around 240-250lbs    Comments Lucianne Lei reports his hemoglobin was low and his energy has been down - he just started taking iron supplements today and rpeorts he is feeling better than he did earlier today. He no longer has to take his fluid pill. His wife was a Marine scientist and keeps him on track. B: scrambled egg (3 whole eggs, no fat and no salt added) - he will sometimes have some honey or ketchup and water L: varied: soup (vegetbale soup with some hamburger meat that his wife makes) or banana and peanut butter sandwich or chicken salad and crackers. D: usually his biggest meal: meat and two vegetables - brocooli, green beans, butter beans and corn, peas - he salts some of his food, but started today. He reports his doctor said to eat more chicken and plant foods. Discussed heart healthy eating, getting a variety of plant foods, lower sodium MNT, and the importance of meeting his calorie and protein needs. He would like to try rotisserie chicken instead of red meat - discussed how that is high in sodium and that he shouldn't do that for every meal, but can pre-prepare some chicken breasts roasted in the oven with some vegetables for ease. three main things to work on - increased plant intake, lowered red meat intake, limited Na.      Intervention Plan   Intervention Prescribe, educate and counsel regarding individualized specific dietary modifications aiming towards targeted core components such as weight, hypertension, lipid management, diabetes, heart failure and other comorbidities.;Nutrition handout(s) given to patient.    Expected Outcomes Short Term Goal: Understand basic principles of dietary content, such as calories, fat, sodium, cholesterol and nutrients.;Short Term Goal: A plan has been developed with personal nutrition goals set during dietitian appointment.;Long Term Goal: Adherence to prescribed nutrition plan.           Nutrition Assessments:  Nutrition  Assessments - 09/27/20 1604      MEDFICTS Scores   Pre Score 24          MEDIFICTS Score Key:  ?70 Need to make dietary changes   40-70 Heart Healthy Diet  ? 40 Therapeutic Level Cholesterol Diet   Picture Your Plate Scores:  <28 Unhealthy dietary pattern with much room for improvement.  41-50 Dietary pattern unlikely to meet recommendations for good health and room for improvement.  51-60 More healthful dietary pattern, with some room for improvement.   >60 Healthy dietary pattern, although there  may be some specific behaviors that could be improved.   Nutrition Goals Re-Evaluation:  Nutrition Goals Re-Evaluation    [place] Name 10/28/20 1355 11/18/20 1530           Goals   Nutrition Goal -- ST: limit red meat to 2-3x/week LT: He would like to maintain his weight around 240-250lbs      Comment Lucianne Lei has said his wife and Dr have recommended low salt and that is how she prepares food at home.  He is also taking an iron supplement. Lucianne Lei has said his wife and Dr have recommended low salt and that is how she prepares food at home.  He is also taking an iron supplement. Lucianne Lei has no new goals or questions at this time. He has completely cut additional salt from his diet.      Expected Outcome Short:  continue recommendations from RD  Long: reach goal weight Short: Continue to work with RD on personal goals Long: Maintain healthy diet appropriate for patient             Nutrition Goals Discharge (Final Nutrition Goals Re-Evaluation):  Nutrition Goals Re-Evaluation - 11/18/20 1530      Goals   Nutrition Goal ST: limit red meat to 2-3x/week LT: He would like to maintain his weight around 240-250lbs    Comment Lucianne Lei has said his wife and Dr have recommended low salt and that is how she prepares food at home.  He is also taking an iron supplement. Lucianne Lei has no new goals or questions at this time. He has completely cut additional salt from his diet.    Expected Outcome Short: Continue to  work with RD on personal goals Long: Maintain healthy diet appropriate for patient           Psychosocial: Target Goals: Acknowledge presence or absence of significant depression and/or stress, maximize coping skills, provide positive support system. Participant is able to verbalize types and ability to use techniques and skills needed for reducing stress and depression.   Education: Stress, Anxiety, and Depression - Group verbal and visual presentation to define topics covered.  Reviews how body is impacted by stress, anxiety, and depression.  Also discusses healthy ways to reduce stress and to treat/manage anxiety and depression.  Written material given at graduation. Flowsheet Row Pulmonary Rehab from 11/24/2020 in [facility] Cardiac and Pulmonary Rehab  Date 10/20/20  Educator [facility]  Instruction Review Code 1- [employer] Understanding      Education: Sleep Hygiene -Provides group verbal and written instruction about how sleep can affect your health.  Define sleep hygiene, discuss sleep cycles and impact of sleep habits. Review good sleep hygiene tips.    Initial Review & Psychosocial Screening:  Initial Psych Review & Screening - 09/24/20 1308      Initial Review   Current issues with Current Stress Concerns    Source of Stress Concerns Unable to participate in former interests or hobbies;Unable to perform yard/household activities;Chronic Illness      Family Dynamics   Good Support System? Yes   wife     Barriers   Psychosocial barriers to participate in program There are no identifiable barriers or psychosocial needs.      Screening Interventions   Interventions Encouraged to exercise;To provide support and resources with identified psychosocial needs;Provide feedback about the scores to participant    Expected Outcomes Short Term goal: Utilizing psychosocial counselor, staff and physician to assist with identification of specific Stressors or current issues interfering with  healing  process. Setting desired goal for each stressor or current issue identified.;Long Term Goal: Stressors or current issues are controlled or eliminated.;Short Term goal: Identification and review with participant of any Quality of Life or Depression concerns found by scoring the questionnaire.;Long Term goal: The participant improves quality of Life and PHQ9 Scores as seen by post scores and/or verbalization of changes           Quality of Life Scores:  Scores of 19 and below usually indicate a poorer quality of life in these areas.  A difference of  2-3 points is a clinically meaningful difference.  A difference of 2-3 points in the total score of the Quality of Life Index has been associated with significant improvement in overall quality of life, self-image, physical symptoms, and general health in studies assessing change in quality of life.  PHQ-9: Recent Review Flowsheet Data    Depression screen [facility] 2/9 09/27/2020   Decreased Interest 0   Down, Depressed, Hopeless 0   PHQ - 2 Score 0   Altered sleeping 0   Tired, decreased energy 0   Change in appetite 0   Feeling bad or failure about yourself  1   Trouble concentrating 1   Moving slowly or fidgety/restless 0   Suicidal thoughts 0   PHQ-9 Score 2   Difficult doing work/chores Not difficult at all     Interpretation of Total Score  Total Score Depression Severity:  1-4 = Minimal depression, 5-9 = Mild depression, 10-14 = Moderate depression, 15-19 = Moderately severe depression, 20-27 = Severe depression   Psychosocial Evaluation and Intervention:  Psychosocial Evaluation - 09/24/20 1324      Psychosocial Evaluation & Interventions   Comments Lewayne reports doing well thanks to his supportive wife who was a Marine scientist. He had Covid in August of this year and now he is on oxygen. He states his shortness of breath has gotten better but he is looking forward to coming to pulmonary rehab to learn more and feel better. 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 It is stable and he is being followed by vascular, but he can't get his blood pressure above 135/80s and doesn't know what all he can/can't do. He is looking forward to coming to learn more and increase his stamina.    Expected Outcomes Short: attend Pulmonary Rehab for education and exercise. Long: develop and maintain positive self care habits.    Continue Psychosocial Services  Follow up required by staff           Psychosocial Re-Evaluation:  Psychosocial Re-Evaluation    [place] Name 10/28/20 1401 11/18/20 1411           Psychosocial Re-Evaluation   Comments Eliseo Squires has been sleeping better since starting [facility].  He also says stress is low - he has a big dog that he spends time with to lower stress. Lucianne Lei is doing great. Denies any sleep, stress or emotional concerns at this time. He has good support from family and wife. Stays compliant with Xanax that he takes for anxiety. He reently saw his doctor who gave him good results about his lungs which he is very happy about. He is enjoying coming to rehab and feels it is helping him tremendously.      Expected Outcomes Short: continue to exercise Long:  maintain positive outlook Short: Continue attendance with [facility] Long: Continue to use exercise for positive attitude and stress management      Interventions --  Encouraged to attend Pulmonary Rehabilitation for the exercise      Continue Psychosocial Services  -- Follow up required by staff             Psychosocial Discharge (Final Psychosocial Re-Evaluation):  Psychosocial Re-Evaluation - 11/18/20 1411      Psychosocial Re-Evaluation   Comments Lucianne Lei is doing great. Denies any sleep, stress or emotional concerns at this time. He has good support from family and wife. Stays compliant with Xanax that he takes for anxiety. He reently saw his doctor who gave him good results about his lungs which he is very happy about. He is enjoying coming  to rehab and feels it is helping him tremendously.    Expected Outcomes Short: Continue attendance with [facility] Long: Continue to use exercise for positive attitude and stress management    Interventions Encouraged to attend Pulmonary Rehabilitation for the exercise    Continue Psychosocial Services  Follow up required by staff           Education: Education Goals: Education classes will be provided on a weekly basis, covering required topics. Participant will state understanding/return demonstration of topics presented.  Learning Barriers/Preferences:  Learning Barriers/Preferences - 09/24/20 1308      Learning Barriers/Preferences   Learning Barriers None    Learning Preferences None           General Pulmonary Education Topics:  Infection Prevention: - Provides verbal and written material to individual with discussion of infection control including proper hand washing and proper equipment cleaning during exercise session. Flowsheet Row Pulmonary Rehab from 11/24/2020 in [facility] Cardiac and Pulmonary Rehab  Date 09/27/20  Educator [facility]  Instruction Review Code 1- Verbalizes Understanding      Falls Prevention: - Provides verbal and written material to individual with discussion of falls prevention and safety. Flowsheet Row Pulmonary Rehab from 11/24/2020 in [facility] Cardiac and Pulmonary Rehab  Date 09/27/20  Educator [facility]  Instruction Review Code 1- Verbalizes Understanding      Chronic Lung Disease Review: - Group verbal instruction with posters, models, PowerPoint presentations and videos,  to review new updates, new respiratory medications, new advancements in procedures and treatments. Providing information on websites and "800" numbers for continued self-education. Includes information about supplement oxygen, available portable oxygen systems, continuous and intermittent flow rates, oxygen safety, concentrators, and Medicare reimbursement for oxygen. Explanation of Pulmonary  Drugs, including class, frequency, complications, importance of spacers, rinsing mouth after steroid MDI's, and proper cleaning methods for nebulizers. Review of basic lung anatomy and physiology related to function, structure, and complications of lung disease. Review of risk factors. Discussion about methods for diagnosing sleep apnea and types of masks and machines for OSA. Includes a review of the use of types of environmental controls: home humidity, furnaces, filters, dust mite/pet prevention, HEPA vacuums. Discussion about weather changes, air quality and the benefits of nasal washing. Instruction on Warning signs, infection symptoms, calling MD promptly, preventive modes, and value of vaccinations. Review of effective airway clearance, coughing and/or vibration techniques. Emphasizing that all should Create an Action Plan. Written material given at graduation. Flowsheet Row Pulmonary Rehab from 11/24/2020 in [facility] Cardiac and Pulmonary Rehab  Date 10/13/20  Educator [facility]  Instruction Review Code 1- Verbalizes Understanding      AED/CPR: - Group verbal and written instruction with the use of models to demonstrate the basic use of the AED with the basic ABC's of resuscitation.    Anatomy and Cardiac Procedures: - Group verbal and visual presentation  and models provide information about basic cardiac anatomy and function. Reviews the testing methods done to diagnose heart disease and the outcomes of the test results. Describes the treatment choices: Medical Management, Angioplasty, or Coronary Bypass Surgery for treating various heart conditions including Myocardial Infarction, Angina, Valve Disease, and Cardiac Arrhythmias.  Written material given at graduation. Flowsheet Row Pulmonary Rehab from 11/24/2020 in [facility] Cardiac and Pulmonary Rehab  Date 11/03/20  Educator [facility]  Instruction Review Code 1- Verbalizes Understanding      Medication Safety: - Group verbal and visual instruction to review  commonly prescribed medications for heart and lung disease. Reviews the medication, class of the drug, and side effects. Includes the steps to properly store meds and maintain the prescription regimen.  Written material given at graduation.   Other: -Provides group and verbal instruction on various topics (see comments)   Knowledge Questionnaire Score:  Knowledge Questionnaire Score - 09/27/20 1603      Knowledge Questionnaire Score   Pre Score 12/18 exercise oxygen            Core Components/Risk Factors/Patient Goals at Admission:  Personal Goals and Risk Factors at Admission - 09/27/20 1557      Core Components/Risk Factors/Patient Goals on Admission    Weight Management Weight Maintenance    Improve shortness of breath with ADL's Yes    Intervention Provide education, individualized exercise plan and daily activity instruction to help decrease symptoms of SOB with activities of daily living.    Expected Outcomes Short Term: Improve cardiorespiratory fitness to achieve a reduction of symptoms when performing ADLs;Long Term: Be able to perform more ADLs without symptoms or delay the onset of symptoms    Hypertension Yes    Intervention Provide education on lifestyle modifcations including regular physical activity/exercise, weight management, moderate sodium restriction and increased consumption of fresh fruit, vegetables, and low fat dairy, alcohol moderation, and smoking cessation.;Monitor prescription use compliance.    Lipids Yes    Intervention Provide education and support for participant on nutrition & aerobic/resistive exercise along with prescribed medications to achieve LDL '70mg'$ , HDL >$Remo'40mg'dXOLi$ .    Expected Outcomes Short Term: Participant states understanding of desired cholesterol values and is compliant with medications prescribed. Participant is following exercise prescription and nutrition guidelines.;Long Term: Cholesterol controlled with medications as prescribed, with  individualized exercise RX and with personalized nutrition plan. Value goals: LDL < $Rem'70mg'JcqM$ , HDL > 40 mg.           Education:Diabetes - Individual verbal and written instruction to review signs/symptoms of diabetes, desired ranges of glucose level fasting, after meals and with exercise. Acknowledge that pre and post exercise glucose checks will be done for 3 sessions at entry of program.   Know Your Numbers and Heart Failure: - Group verbal and visual instruction to discuss disease risk factors for cardiac and pulmonary disease and treatment options.  Reviews associated critical values for Overweight/Obesity, Hypertension, Cholesterol, and Diabetes.  Discusses basics of heart failure: signs/symptoms and treatments.  Introduces Heart Failure Zone chart for action plan for heart failure.  Written material given at graduation. Flowsheet Row Pulmonary Rehab from 11/24/2020 in [facility] Cardiac and Pulmonary Rehab  Date 10/06/20  Educator [facility]  Instruction Review Code 1- Verbalizes Understanding      Core Components/Risk Factors/Patient Goals Review:   Goals and Risk Factor Review    Row Name 10/28/20 1350 11/18/20 1414           Core Components/Risk Factors/Patient Goals Review   Personal Goals  Review Weight Management/Obesity;Hypertension;Lipids Weight Management/Obesity;Hypertension      Review Lucianne Lei saw his Dr earlier in the week and his lung function has improved some since starting [facility].  He has noticed that his breathing is easier and deeper.  He is sleeping better.  He would like to get off oxygen altogether.  He reports taking medications as directed.  He does check BP at home and sends to Dr Caryl Comes every week. Lucianne Lei take his BP takes at home, At home 276-701T systolic, 00P diastolic. Stable at rehab, usualy ranges in the [id number] systolic. Weight has been stable around 255 lb, right now he wants to maintain until he gets his strength back first. Then he will work on losing some weight. He  has been weaning off O2 with his exercise and he's been feeling good.      Expected Outcomes Short:  continue current regimen Long: be able to get off oxygen Short: Continue taking BP and vitals at home Long: Continue to manage lifestyle risk factors             Core Components/Risk Factors/Patient Goals at Discharge (Final Review):   Goals and Risk Factor Review - 11/18/20 1414      Core Components/Risk Factors/Patient Goals Review   Personal Goals Review Weight Management/Obesity;Hypertension    Review Lucianne Lei take his BP takes at home, At home 391-225Y systolic, 34M diastolic. Stable at rehab, usualy ranges in the ITVIF-125I systolic. Weight has been stable around 255 lb, right now he wants to maintain until he gets his strength back first. Then he will work on losing some weight. He has been weaning off O2 with his exercise and he's been feeling good.    Expected Outcomes Short: Continue taking BP and vitals at home Long: Continue to manage lifestyle risk factors           ITP Comments:  ITP Comments    Row Name 09/24/20 1329 09/29/20 1423 10/13/20 1059 11/10/20 0733 12/08/20 1003   ITP Comments Initial telephone orientation completed. Diagnosis can be found in [facility] 11/2. EP orientation scheduled for Monday 11/15 at 1pm First full day of exercise!  Patient was oriented to gym and equipment including functions, settings, policies, and procedures.  Patient's individual exercise prescription and treatment plan were reviewed.  All starting workloads were established based on the results of the 6 minute walk test done at initial orientation visit.  The plan for exercise progression was also introduced and progression will be customized based on patient's performance and goals. 30 Day review completed. Medical Director ITP review done, changes made as directed, and signed approval by Medical Director.  New to program 30 Day review completed. Medical Director ITP review done, changes made as directed,  and signed approval by Medical Director. 30 Day review completed. Medical Director ITP review done, changes made as directed, and signed approval by Medical Director.          Comments:

## 2020-12-08 NOTE — Progress Notes (Signed)
Daily Session Note  Patient Details  Name: Alexander Howe MRN: [id number] Date of Birth: April 26, 1946 Referring Provider:   Flowsheet Row Pulmonary Rehab from 09/27/2020 in [facility] Cardiac and Pulmonary Rehab  Referring Provider Caryl Comes      Encounter Date: 12/08/2020  Check In:  Session Check In - 12/08/20 1405      Check-In   Supervising physician immediately available to respond to emergencies See telemetry face sheet for immediately available ER MD    Location [facility]-Cardiac & Pulmonary Rehab    Staff Present Birdie Sons, MPA, Elveria Rising, BA, ACSM CEP, Exercise Physiologist;Kara Eliezer Bottom, MS Exercise Physiologist    Virtual Visit No    Medication changes reported     No    Fall or balance concerns reported    No    Warm-up and Cool-down Performed on first and last piece of equipment    Resistance Training Performed Yes    VAD Patient? No    PAD/SET Patient? No      Pain Assessment   Currently in Pain? No/denies              Social History   Tobacco Use  Smoking Status Former Smoker  Smokeless Tobacco Former Systems developer  . Types: Chew  . Quit date: 11/29/1990    Goals Met:  Independence with exercise equipment Exercise tolerated well No report of cardiac concerns or symptoms Strength training completed today  Goals Unmet:  Not Applicable  Comments: Pt able to follow exercise prescription today without complaint.  Will continue to monitor for progression.    Dr. Emily Filbert is Medical Director for [place] and [facility].

## 2020-12-09 ENCOUNTER — Encounter: Payer: Medicare Other | Admitting: *Deleted

## 2020-12-09 ENCOUNTER — Encounter: Payer: Self-pay | Admitting: Dermatology

## 2020-12-09 ENCOUNTER — Ambulatory Visit: Payer: Medicare Other | Admitting: Dermatology

## 2020-12-09 ENCOUNTER — Other Ambulatory Visit: Payer: Self-pay

## 2020-12-09 VITALS — Ht 73.5 in | Wt 257.0 lb

## 2020-12-09 DIAGNOSIS — L570 Actinic keratosis: Secondary | ICD-10-CM

## 2020-12-09 DIAGNOSIS — J9611 Chronic respiratory failure with hypoxia: Secondary | ICD-10-CM

## 2020-12-09 DIAGNOSIS — L820 Inflamed seborrheic keratosis: Secondary | ICD-10-CM

## 2020-12-09 DIAGNOSIS — Z85828 Personal history of other malignant neoplasm of skin: Secondary | ICD-10-CM | POA: Diagnosis not present

## 2020-12-09 DIAGNOSIS — C4492 Squamous cell carcinoma of skin, unspecified: Secondary | ICD-10-CM

## 2020-12-09 DIAGNOSIS — C44529 Squamous cell carcinoma of skin of other part of trunk: Secondary | ICD-10-CM | POA: Diagnosis not present

## 2020-12-09 DIAGNOSIS — L578 Other skin changes due to chronic exposure to nonionizing radiation: Secondary | ICD-10-CM

## 2020-12-09 MED ORDER — TRIAMCINOLONE ACETONIDE 0.1 % EX OINT: TOPICAL_OINTMENT | CUTANEOUS | 1 refills | Status: DC

## 2020-12-09 NOTE — Patient Instructions (Addendum)
Wound Care Instructions  1. Cleanse wound gently with soap and water once a day then pat dry with clean gauze. Apply a thing coat of Petrolatum (petroleum jelly, "Vaseline") over the wound (unless you have an allergy to this). We recommend that you use a new, sterile tube of Vaseline. Do not pick or remove scabs. Do not remove the yellow or white "healing tissue" from the base of the wound.  2. Cover the wound with fresh, clean, nonstick gauze and secure with paper tape. You may use Band-Aids in place of gauze and tape if the would is small enough, but would recommend trimming much of the tape off as there is often too much. Sometimes Band-Aids can irritate the skin.  3. You should call the office for your biopsy report after 1 week if you have not already been contacted.  4. If you experience any problems, such as abnormal amounts of bleeding, swelling, significant bruising, significant pain, or evidence of infection, please call the office immediately.  5. FOR ADULT SURGERY PATIENTS: If you need something for pain relief you may take 1 extra strength Tylenol (acetaminophen) AND 2 Ibuprofen (200mg  each) together every 4 hours as needed for pain. (do not take these if you are allergic to them or if you have a reason you should not take them.) Typically, you may only need pain medication for 1 to 3 days.    Cryotherapy Aftercare  . Wash gently with soap and water everyday.   Marland Kitchen Apply Vaseline and Band-Aid daily until healed.  Prior to procedure, discussed risks of blister formation, small wound, skin dyspigmentation, or rare scar following cryotherapy.    5-Fluorouracil/Calcipotriene Patient Education   Actinic keratoses are the dry, red scaly spots on the skin caused by sun damage. A portion of these spots can turn into skin cancer with time, and treating them can help prevent development of skin cancer.   Treatment of these spots requires removal of the defective skin cells. There are  various ways to remove actinic keratoses, including freezing with liquid nitrogen, treatment with creams, or treatment with a blue light procedure in the office.   5-fluorouracil cream is a topical cream used to treat actinic keratoses. It works by interfering with the growth of abnormal fast-growing skin cells, such as actinic keratoses. These cells peel off and are replaced by healthy ones.   5-fluorouracil/calcipotriene is a combination of the 5-fluorouracil cream with a vitamin D analog cream called calcipotriene. The calcipotriene alone does not treat actinic keratoses. However, when it is combined with 5-fluorouracil, it helps the 5-fluorouracil treat the actinic keratoses much faster so that the same results can be achieved with a much shorter treatment time.  INSTRUCTIONS FOR 5-FLUOROURACIL/CALCIPOTRIENE CREAM:   5-fluorouracil/calcipotriene cream typically only needs to be used for 4-7 days. A thin layer should be applied twice a day to the treatment areas recommended by your physician.   If your physician prescribed you separate tubes of 5-fluourouracil and calcipotriene, apply a thin layer of 5-fluorouracil followed by a thin layer of calcipotriene.   Avoid contact with your eyes, nostrils, and mouth. Do not use 5-fluorouracil/calcipotriene cream on infected or open wounds.   You will develop redness, irritation and some crusting at areas where you have pre-cancer damage/actinic keratoses. IF YOU DEVELOP PAIN, BLEEDING, OR SIGNIFICANT CRUSTING, STOP THE TREATMENT EARLY - you have already gotten a good response and the actinic keratoses should clear up well.  Wash your hands after applying 5-fluorouracil 5% cream on your  skin.   A moisturizer or sunscreen with a minimum SPF 30 should be applied each morning.   Once you have finished the treatment, you can apply a thin layer of Vaseline twice a day to irritated areas to soothe and calm the areas more quickly. If you experience  significant discomfort, contact your physician.  For some patients it is necessary to repeat the treatment for best results.  SIDE EFFECTS: When using 5-fluorouracil/calcipotriene cream, you may have mild irritation, such as redness, dryness, swelling, or a mild burning sensation. This usually resolves within 2 weeks. The more actinic keratoses you have, the more redness and inflammation you can expect during treatment. Eye irritation has been reported rarely. If this occurs, please let us know.  If you have any trouble using this cream, please call the office. If you have any other questions about this information, please do not hesitate to ask me before you leave the office.  - Start 5-fluorouracil/calcipotriene cream twice a day for 7 days to affected areas including scalp. Prescription sent to [facility]. Patient advised they will receive an email to purchase the medication online and have it sent to their home. Patient provided with handout reviewing treatment course and side effects and advised to call or message [place] on MyChart with any concerns.

## 2020-12-09 NOTE — Progress Notes (Signed)
Follow-Up Visit   Subjective  Alexander Howe is a 75 y.o. male who presents for the following: Follow-up (Patient here today for 3 month AK follow up. Areas at scalp, forehead and left ear were not treated at August appointment due to patient recovering from Covid.).  Also here for treatment of SCC at chest  The following portions of the chart were reviewed this encounter and updated as appropriate:   Tobacco  Allergies  Meds  Problems  Med Hx  Surg Hx  Fam Hx      Review of Systems:  No other skin or systemic complaints except as noted in HPI or Assessment and Plan.  Objective  Well appearing patient in no apparent distress; mood and affect are within normal limits.  A focused examination was performed including face, scalp, ears, chest. Relevant physical exam findings are noted in the Assessment and Plan.  Objective  right lateral chest: Pink plaque  Objective  L post ear x 1, L helix x 2, L cheek x 1, R forehead x 1, R cheek x 4, R ear x 2, R neck x 1 (12): Erythematous thin papules/macules with gritty scale.   Objective  R temple x 1: Erythematous keratotic or waxy stuck-on papule or plaque.    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    Destruction of lesion - L post ear x 1, L helix x 2, L cheek x 1, R forehead x 1, R cheek x 4, R ear x 2, R neck x 1 Complexity: simple   Destruction method: cryotherapy   Informed consent: discussed and consent obtained   Lesion destroyed using liquid nitrogen: Yes   Cryotherapy cycles:  2 Outcome: patient tolerated procedure well with no complications   Post-procedure details: wound care instructions given    Ordered Medications: triamcinolone ointment (KENALOG) 0.1 %  Inflamed seborrheic keratosis R temple x 1  Destruction of lesion - R temple x 1 Complexity: simple   Destruction method: cryotherapy   Informed consent: discussed and consent obtained   Lesion destroyed using liquid nitrogen: Yes   Cryotherapy cycles:  2 Outcome: patient tolerated procedure well with no complications   Post-procedure details: wound care instructions given     History of Basal Cell Carcinoma of the Skin - No evidence of recurrence today - Recommend regular full body skin exams - Recommend daily broad spectrum sunscreen SPF 30+ to  sun-exposed areas, reapply every 2 hours as needed.  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  The purpose is to decrease the number of clinically evident and subclinical PreCancerous lesions to prevent progression to development of skin cancer by chemically destroying early precancer changes that may or may not be visible.  It has been shown to reduce the risk of developing skin cancer in the treated area. As a result of treatment, redness, scaling, crusting, and open sores may occur during treatment course. One or more than one of these methods may be used and may have to be used several times to control, suppress and eliminate the PreCancerous changes. Discussed treatment course, expected reaction, and possible side effects. - Recommend daily broad spectrum sunscreen SPF 30+ to sun-exposed areas, reapply every 2 hours as needed.  - Call for new or changing lesions. - Start 5-fluorouracil/calcipotriene cream twice a day for 7 days to affected areas including scalp. Prescription sent to [facility]. Patient advised they will receive an email to purchase the medication online and have it sent to their home. Patient provided with handout reviewing treatment course and side effects and advised to call or message [place] on MyChart with any concerns.   Return in about 6 weeks (around 01/20/2021) for AK follow up.  Graciella Belton, RMA, am acting as scribe for Forest Gleason, MD .  Documentation: I have reviewed the above documentation for accuracy and completeness, and I agree with the above.  Forest Gleason, MD

## 2020-12-09 NOTE — Progress Notes (Signed)
Daily Session Note  Patient Details  Howe: Alexander Howe MRN: [id number] Date of Birth: Sep 10, 1946 Referring Provider:   Flowsheet Row Pulmonary Rehab from 09/27/2020 in [facility] Cardiac and Pulmonary Rehab  Referring Provider Caryl Comes      Encounter Date: 12/09/2020  Check In:  Session Check In - 12/09/20 1400      Check-In   Supervising physician immediately available to respond to emergencies See telemetry face sheet for immediately available ER MD    Location [facility]-Cardiac & Pulmonary Rehab    Staff Present Renita Papa, RN BSN;Joseph [address] [place], [place], [place], CCRP, CCET    Virtual Visit No    Medication changes reported     No    Fall or balance concerns reported    No    Warm-up and Cool-down Performed on first and last piece of equipment    Resistance Training Performed Yes    VAD Patient? No    PAD/SET Patient? No      Pain Assessment   Currently in Pain? No/denies              Social History   Tobacco Use  Smoking Status Former Smoker  Smokeless Tobacco Former Systems developer  . Types: Chew  . Quit date: 11/29/1990    Goals Met:  Independence with exercise equipment Exercise tolerated well No report of cardiac concerns or symptoms Strength training completed today  Goals Unmet:  Not Applicable  Comments: Pt able to follow exercise prescription today without complaint.  Will continue to monitor for progression.   Alexander Howe 09/27/20 1548 12/09/20 1428       6 Minute Walk   Phase Initial Discharge    Distance 895 feet 1250 feet    Distance % Change -- 39.7 %    Distance Feet Change -- 355 ft    Walk Time 6 minutes 6 minutes    # of Rest Breaks 0 0    MPH 1.7 2.37    METS 1.8 2.42    RPE 11 11    Perceived Dyspnea  0 0    VO2 Peak 6.26 8.45    Symptoms No No    Resting HR 63 bpm 61 bpm    Resting BP 124/74 126/76    Resting Oxygen Saturation  95 % 97 %    Exercise Oxygen Saturation  during 6 min walk 91 % 84 %     Max Ex. HR 96 bpm 86 bpm    Max Ex. BP 152/84 174/76    2 Minute Post BP 124/78 136/74         Interval HR   1 Minute HR 84 74    2 Minute HR 88 73    3 Minute HR 89 73    4 Minute HR 94 82    5 Minute HR 96 77    6 Minute HR 94 86    2 Minute Post HR 82 66    Interval Heart Rate? Yes Yes         Interval Oxygen   Interval Oxygen? Yes Yes    Baseline Oxygen Saturation % 95 % 97 %    1 Minute Oxygen Saturation % 96 % 93 %    1 Minute Liters of Oxygen 3 L 0 L  Room Air    2 Minute Oxygen Saturation % 95 % 88 %    2 Minute Liters of Oxygen 3 L 0 L  3 Minute Oxygen Saturation % 92 % 84 %    3 Minute Liters of Oxygen 3 L 0 L    4 Minute Oxygen Saturation % 91 % 86 %    4 Minute Liters of Oxygen 3 L 0 L    5 Minute Oxygen Saturation % 91 % 90 %    5 Minute Liters of Oxygen 3 L 0 L    6 Minute Oxygen Saturation % 91 % 91 %    6 Minute Liters of Oxygen 3 L 0 L    2 Minute Post Oxygen Saturation % 98 % 95 %    2 Minute Post Liters of Oxygen 3 L 0 L            Dr. Emily Filbert is Medical Director for [place] and [facility].

## 2020-12-12 ENCOUNTER — Encounter: Payer: Self-pay | Admitting: Dermatology

## 2020-12-13 ENCOUNTER — Other Ambulatory Visit: Payer: Self-pay

## 2020-12-13 DIAGNOSIS — J9611 Chronic respiratory failure with hypoxia: Secondary | ICD-10-CM | POA: Diagnosis not present

## 2020-12-13 NOTE — Progress Notes (Signed)
Daily Session Note  Patient Details  Name: Alexander Howe MRN: [id number] Date of Birth: 1946/08/19 Referring Provider:   Flowsheet Row Pulmonary Rehab from 09/27/2020 in [facility] Cardiac and Pulmonary Rehab  Referring Provider Caryl Comes      Encounter Date: 12/13/2020  Check In:  Session Check In - 12/13/20 1412      Check-In   Supervising physician immediately available to respond to emergencies See telemetry face sheet for immediately available ER MD    Location [facility]-Cardiac & Pulmonary Rehab    Staff Present Birdie Sons, MPA, Mauricia Area, BS, ACSM CEP, Exercise Physiologist;Laureen Owens Shark, BS, RRT, CPFT    Virtual Visit No    Medication changes reported     No    Fall or balance concerns reported    No    Warm-up and Cool-down Performed on first and last piece of equipment    Resistance Training Performed Yes    VAD Patient? No    PAD/SET Patient? No      Pain Assessment   Currently in Pain? No/denies              Social History   Tobacco Use  Smoking Status Former Smoker  Smokeless Tobacco Former Systems developer  . Types: Chew  . Quit date: 11/29/1990    Goals Met:  Independence with exercise equipment Exercise tolerated well No report of cardiac concerns or symptoms Strength training completed today  Goals Unmet:  Not Applicable  Comments: Pt able to follow exercise prescription today without complaint.  Will continue to monitor for progression.    Dr. Emily Filbert is Medical Director for [place] and [facility].

## 2020-12-15 ENCOUNTER — Encounter: Payer: Medicare Other | Admitting: Dermatology

## 2020-12-15 ENCOUNTER — Encounter: Payer: Medicare Other | Attending: Internal Medicine

## 2020-12-15 ENCOUNTER — Other Ambulatory Visit: Payer: Self-pay

## 2020-12-15 DIAGNOSIS — J9611 Chronic respiratory failure with hypoxia: Secondary | ICD-10-CM | POA: Diagnosis not present

## 2020-12-15 NOTE — Progress Notes (Signed)
Daily Session Note  Patient Details  Name: Alexander Howe MRN: [id number] Date of Birth: 28-Apr-1946 Referring Provider:   Flowsheet Row Pulmonary Rehab from 09/27/2020 in [facility] Cardiac and Pulmonary Rehab  Referring Provider Caryl Comes      Encounter Date: 12/15/2020  Check In:  Session Check In - 12/15/20 1343      Check-In   Supervising physician immediately available to respond to emergencies See telemetry face sheet for immediately available ER MD    Location [facility] & Pulmonary Rehab    Staff Present Birdie Sons, MPA, RN;Joseph Lou [place], [place] Exercise Physiologist    Virtual Visit No    Medication changes reported     No    Fall or balance concerns reported    No    Warm-up and Cool-down Performed on first and last piece of equipment    Resistance Training Performed Yes    VAD Patient? No    PAD/SET Patient? No      Pain Assessment   Currently in Pain? No/denies              Social History   Tobacco Use  Smoking Status Former Smoker  Smokeless Tobacco Former Systems developer  . Types: Chew  . Quit date: 11/29/1990    Goals Met:  Independence with exercise equipment Exercise tolerated well No report of cardiac concerns or symptoms Strength training completed today  Goals Unmet:  Not Applicable  Comments: Pt able to follow exercise prescription today without complaint.  Will continue to monitor for progression.    Dr. Emily Filbert is Medical Director for [place] and [facility].

## 2020-12-16 ENCOUNTER — Encounter: Payer: Medicare Other | Admitting: *Deleted

## 2020-12-16 ENCOUNTER — Other Ambulatory Visit: Payer: Self-pay

## 2020-12-16 DIAGNOSIS — J9611 Chronic respiratory failure with hypoxia: Secondary | ICD-10-CM

## 2020-12-16 NOTE — Progress Notes (Signed)
Daily Session Note  Patient Details  Name: Alexander Howe MRN: [id number] Date of Birth: 1946-07-19 Referring Provider:   Flowsheet Row Pulmonary Rehab from 09/27/2020 in [facility] Cardiac and Pulmonary Rehab  Referring Provider Caryl Comes      Encounter Date: 12/16/2020  Check In:  Session Check In - 12/16/20 1352      Check-In   Supervising physician immediately available to respond to emergencies See telemetry face sheet for immediately available ER MD    Location [facility] & Pulmonary Rehab    Staff Present Renita Papa, RN BSN;Joseph [address] [place], [place], [place], CCRP, CCET    Virtual Visit No    Medication changes reported     No    Fall or balance concerns reported    No    Warm-up and Cool-down Performed on first and last piece of equipment    Resistance Training Performed Yes    VAD Patient? No    PAD/SET Patient? No      Pain Assessment   Currently in Pain? No/denies              Social History   Tobacco Use  Smoking Status Former Smoker  Smokeless Tobacco Former Systems developer  . Types: Chew  . Quit date: 11/29/1990    Goals Met:  Independence with exercise equipment Exercise tolerated well No report of cardiac concerns or symptoms Strength training completed today  Goals Unmet:  Not Applicable  Comments: Pt able to follow exercise prescription today without complaint.  Will continue to monitor for progression.    Dr. Emily Filbert is Medical Director for [place] and [facility].

## 2020-12-20 ENCOUNTER — Other Ambulatory Visit: Payer: Self-pay

## 2020-12-20 DIAGNOSIS — J9611 Chronic respiratory failure with hypoxia: Secondary | ICD-10-CM | POA: Diagnosis not present

## 2020-12-20 NOTE — Progress Notes (Signed)
Daily Session Note  Patient Details  Name: Alexander Howe MRN: [id number] Date of Birth: May 04, 1946 Referring Provider:   Flowsheet Row Pulmonary Rehab from 09/27/2020 in [facility]  Referring Provider Caryl Comes      Encounter Date: 12/20/2020  Check In:  Session Check In - 12/20/20 1355      Check-In   Supervising physician immediately available to respond to emergencies See telemetry face sheet for immediately available ER MD    Location [facility]    Staff Present Birdie Sons, MPA, [place], BS, ACSM CEP, Exercise Physiologist;Kara Eliezer Bottom, MS Exercise Physiologist    Virtual Visit No    Medication changes reported     No    Fall or balance concerns reported    No    Warm-up and Cool-down Performed on first and last piece of equipment    Resistance Training Performed Yes    VAD Patient? No    PAD/SET Patient? No      Pain Assessment   Currently in Pain? No/denies              Social History   Tobacco Use  Smoking Status Former Smoker  Smokeless Tobacco Former Systems developer  . Types: Chew  . Quit date: 11/29/1990    Goals Met:  Independence with exercise equipment Exercise tolerated well No report of cardiac concerns or symptoms Strength training completed today  Goals Unmet:  Not Applicable  Comments: Pt able to follow exercise prescription today without complaint.  Will continue to monitor for progression.    Dr. Emily Filbert is Medical Director for [facility].

## 2020-12-22 ENCOUNTER — Other Ambulatory Visit: Payer: Self-pay

## 2020-12-22 DIAGNOSIS — J9611 Chronic respiratory failure with hypoxia: Secondary | ICD-10-CM | POA: Diagnosis not present

## 2020-12-22 NOTE — Patient Instructions (Signed)
Discharge Patient Instructions  Patient Details  Name: Alexander Howe MRN: [id number] Date of Birth: Oct 15, 1946 Referring Provider:  Adin Hector, MD   Number of Visits: 36  Reason for Discharge:  Patient reached a stable level of exercise. Patient independent in their exercise. Patient has met program and personal goals.  Smoking History:  Social History   Tobacco Use  Smoking Status Former Smoker  Smokeless Tobacco Former Systems developer  . Types: Chew  . Quit date: 11/29/1990    Diagnosis:  No diagnosis found.  Initial Exercise Prescription:  Initial Exercise Prescription - 09/27/20 1500      Date of Initial Exercise RX and Referring Provider   Date 09/27/20    Referring Provider Caryl Comes      Oxygen   Oxygen Continuous    Liters 3      Treadmill   MPH 1.5    Grade 0    Minutes 15    METs 2      Recumbant Bike   Level 1    RPM 60    Minutes 15    METs 2      NuStep   Level 2    SPM 80    Minutes 15    METs 2      REL-XR   Level 1    Speed 50    Minutes 15    METs 2      Prescription Details   Frequency (times per week) 3    Duration Progress to 30 minutes of continuous aerobic without signs/symptoms of physical distress      Intensity   THRR 40-80% of Max Heartrate 97-130    Ratings of Perceived Exertion 11-15    Perceived Dyspnea 0-4      Resistance Training   Training Prescription Yes    Weight 3 lb    Reps 10-15           Discharge Exercise Prescription (Final Exercise Prescription Changes):  Exercise Prescription Changes - 12/15/20 1400      Response to Exercise   Blood Pressure (Admit) 120/70    Blood Pressure (Exercise) 124/60    Blood Pressure (Exit) 110/70    Heart Rate (Admit) 71 bpm    Heart Rate (Exercise) 71 bpm    Heart Rate (Exit) 70 bpm    Oxygen Saturation (Admit) 91 %    Oxygen Saturation (Exercise) 90 %    Oxygen Saturation (Exit) 94 %    Rating of Perceived Exertion (Exercise) 12    Perceived Dyspnea (Exercise)  0    Symptoms none    Duration Continue with 30 min of aerobic exercise without signs/symptoms of physical distress.    Intensity THRR unchanged      Progression   Progression Continue to progress workloads to maintain intensity without signs/symptoms of physical distress.    Average METs 2.6      Resistance Training   Training Prescription Yes    Weight 5 lb    Reps 10-15      Interval Training   Interval Training No      Treadmill   MPH 1.5    Grade 0    Minutes 15    METs 2.15      REL-XR   Level 2    Minutes 15      Home Exercise Plan   Plans to continue exercise at Home (comment)   Walking   Frequency Add 2 additional days to program exercise sessions.  Initial Home Exercises Provided 11/24/20           Functional Capacity:  6 Minute Walk    Row Name 09/27/20 1548 12/09/20 1428       6 Minute Walk   Phase Initial Discharge    Distance 895 feet 1250 feet    Distance % Change -- 39.7 %    Distance Feet Change -- 355 ft    Walk Time 6 minutes 6 minutes    # of Rest Breaks 0 0    MPH 1.7 2.37    METS 1.8 2.42    RPE 11 11    Perceived Dyspnea  0 0    VO2 Peak 6.26 8.45    Symptoms No No    Resting HR 63 bpm 61 bpm    Resting BP 124/74 126/76    Resting Oxygen Saturation  95 % 97 %    Exercise Oxygen Saturation  during 6 min walk 91 % 84 %    Max Ex. HR 96 bpm 86 bpm    Max Ex. BP 152/84 174/76    2 Minute Post BP 124/78 136/74         Interval HR   1 Minute HR 84 74    2 Minute HR 88 73    3 Minute HR 89 73    4 Minute HR 94 82    5 Minute HR 96 77    6 Minute HR 94 86    2 Minute Post HR 82 66    Interval Heart Rate? Yes Yes         Interval Oxygen   Interval Oxygen? Yes Yes    Baseline Oxygen Saturation % 95 % 97 %    1 Minute Oxygen Saturation % 96 % 93 %    1 Minute Liters of Oxygen 3 L 0 L  Room Air    2 Minute Oxygen Saturation % 95 % 88 %    2 Minute Liters of Oxygen 3 L 0 L    3 Minute Oxygen Saturation % 92 % 84 %    3  Minute Liters of Oxygen 3 L 0 L    4 Minute Oxygen Saturation % 91 % 86 %    4 Minute Liters of Oxygen 3 L 0 L    5 Minute Oxygen Saturation % 91 % 90 %    5 Minute Liters of Oxygen 3 L 0 L    6 Minute Oxygen Saturation % 91 % 91 %    6 Minute Liters of Oxygen 3 L 0 L    2 Minute Post Oxygen Saturation % 98 % 95 %    2 Minute Post Liters of Oxygen 3 L 0 L            Nutrition & Weight - Outcomes:  Pre Biometrics - 09/27/20 1556      Pre Biometrics   Height 6' 1.5" (1.867 m)    Weight 254 lb 9.6 oz (115.5 kg)    BMI (Calculated) 33.13    Single Leg Stand 30 seconds           Post Biometrics - 12/09/20 1434       Post  Biometrics   Height 6' 1.5" (1.867 m)    Weight 257 lb (116.6 kg)    BMI (Calculated) 33.44    Single Leg Stand 30 seconds           Nutrition:  Nutrition Therapy & Goals -  10/20/20 1419      Nutrition Therapy   Diet heart healthy, low Na, pulmonary MNT    Protein (specify units) 100g    Fiber 30 grams    Whole Grain Foods 3 servings    Saturated Fats 12 max. grams    Fruits and Vegetables 8 servings/day    Sodium 1.5 grams      Personal Nutrition Goals   Nutrition Goal ST: limit red meat to 2-3x/week LT: He would like to maintain his weight around 240-250lbs    Comments Lucianne Lei reports his hemoglobin was low and his energy has been down - he just started taking iron supplements today and rpeorts he is feeling better than he did earlier today. He no longer has to take his fluid pill. His wife was a Marine scientist and keeps him on track. B: scrambled egg (3 whole eggs, no fat and no salt added) - he will sometimes have some honey or ketchup and water L: varied: soup (vegetbale soup with some hamburger meat that his wife makes) or banana and peanut butter sandwich or chicken salad and crackers. D: usually his biggest meal: meat and two vegetables - brocooli, green beans, butter beans and corn, peas - he salts some of his food, but started today. He reports his doctor  said to eat more chicken and plant foods. Discussed heart healthy eating, getting a variety of plant foods, lower sodium MNT, and the importance of meeting his calorie and protein needs. He would like to try rotisserie chicken instead of red meat - discussed how that is high in sodium and that he shouldn't do that for every meal, but can pre-prepare some chicken breasts roasted in the oven with some vegetables for ease. three main things to work on - increased plant intake, lowered red meat intake, limited Na.      Intervention Plan   Intervention Prescribe, educate and counsel regarding individualized specific dietary modifications aiming towards targeted core components such as weight, hypertension, lipid management, diabetes, heart failure and other comorbidities.;Nutrition handout(s) given to patient.    Expected Outcomes Short Term Goal: Understand basic principles of dietary content, such as calories, fat, sodium, cholesterol and nutrients.;Short Term Goal: A plan has been developed with personal nutrition goals set during dietitian appointment.;Long Term Goal: Adherence to prescribed nutrition plan.           Nutrition Discharge:  Nutrition Assessments - 09/27/20 1604      MEDFICTS Scores   Pre Score 24           Education Questionnaire Score:  Knowledge Questionnaire Score - 12/13/20 1449      Knowledge Questionnaire Score   Post Score 18/18 correct           Goals reviewed with patient; copy given to patient.

## 2020-12-22 NOTE — Progress Notes (Signed)
Pulmonary Individual Treatment Plan  Patient Details  Name: Alexander Howe MRN: [id number] Date of Birth: [birth year] Referring Provider:   Flowsheet Row Pulmonary Rehab from 09/27/2020 in [facility] Cardiac and Pulmonary Rehab  Referring Provider Graciela Husbands      Initial Encounter Date:  Flowsheet Row Pulmonary Rehab from 09/27/2020 in [facility] Cardiac and Pulmonary Rehab  Date 09/27/20      Visit Diagnosis: Chronic respiratory failure with hypoxia (HCC)  Patient's Home Medications on Admission:  Current Outpatient Medications:  .  albuterol (VENTOLIN HFA) 108 (90 Base) MCG/ACT inhaler, SMARTSIG:2 Inhalation Via Inhaler Every 6 Hours PRN, Disp: , Rfl:  .  ALPRAZolam (XANAX) 0.5 MG tablet, Take 0.5 mg by mouth 2 (two) times daily as needed for anxiety., Disp: , Rfl:  .  amLODipine (NORVASC) 5 MG tablet, Take 5 mg by mouth daily., Disp: , Rfl:  .  aspirin EC 81 MG tablet, Take 81 mg by mouth daily., Disp: , Rfl:  .  atorvastatin (LIPITOR) 80 MG tablet, Take 80 mg by mouth daily., Disp: , Rfl:  .  buPROPion (ZYBAN) 150 MG 12 hr tablet, Take 150 mg by mouth 2 (two) times daily. (Patient not taking: Reported on 09/24/2020), Disp: , Rfl:  .  Cholecalciferol (VITAMIN D3) 2000 units TABS, Take 1 tablet by mouth daily., Disp: , Rfl:  .  docusate sodium (COLACE) 100 MG capsule, Take 200 mg by mouth daily. (Patient not taking: Reported on 09/24/2020), Disp: , Rfl:  .  enoxaparin (LOVENOX) 40 MG/0.4ML injection, Inject 0.4 mLs (40 mg total) into the skin daily. (Patient not taking: Reported on 09/24/2020), Disp: 14 Syringe, Rfl: 0 .  furosemide (LASIX) 20 MG tablet, Take 20 mg by mouth daily as needed. (Patient not taking: Reported on 09/24/2020), Disp: , Rfl:  .  hydrochlorothiazide (HYDRODIURIL) 25 MG tablet, Take 25 mg by mouth daily., Disp: , Rfl:  .  magnesium oxide (MAG-OX) 400 MG tablet, Take 400 mg by mouth daily. (Patient not taking: Reported on 10/13/2020), Disp: , Rfl:  .  metoprolol succinate  (TOPROL-XL) 100 MG 24 hr tablet, Take 100 mg by mouth at bedtime. Take with or immediately following a meal., Disp: , Rfl:  .  Omega-3 Fatty Acids (FISH OIL CONCENTRATE PO), Take 1 capsule by mouth daily., Disp: , Rfl:  .  oxyCODONE (OXY IR/ROXICODONE) 5 MG immediate release tablet, Take 1-2 tablets (5-10 mg total) by mouth every 4 (four) hours as needed for severe pain or breakthrough pain. (Patient not taking: Reported on 09/24/2020), Disp: 80 tablet, Rfl: 0 .  pantoprazole (PROTONIX) 40 MG tablet, Take 40 mg by mouth daily., Disp: , Rfl:  .  PARoxetine (PAXIL) 10 MG tablet, Take 10 mg by mouth daily., Disp: , Rfl:  .  potassium chloride SA (K-DUR,KLOR-CON) 20 MEQ tablet, Take 20 mEq by mouth daily., Disp: , Rfl:  .  predniSONE (DELTASONE) 20 MG tablet, Take 20 mg by mouth daily. (Patient not taking: Reported on 10/20/2020), Disp: , Rfl:  .  quinapril (ACCUPRIL) 40 MG tablet, Take 40 mg by mouth daily., Disp: , Rfl:  .  sulfamethoxazole-trimethoprim (BACTRIM) 400-80 MG tablet, SMARTSIG:1 Tablet(s) By Mouth Every 12 Hours (Patient not taking: Reported on 10/20/2020), Disp: , Rfl:  .  tamsulosin (FLOMAX) 0.4 MG CAPS capsule, Take 0.4 mg by mouth daily. (Patient not taking: Reported on 10/20/2020), Disp: , Rfl:  .  traMADol (ULTRAM) 50 MG tablet, Take 1-2 tablets (50-100 mg total) by mouth every 4 (four) hours as needed for moderate  pain. (Patient not taking: Reported on 09/24/2020), Disp: 60 tablet, Rfl: 1 .  triamcinolone ointment (KENALOG) 0.1 %, Apply twice daily for 1 week to scalp if needed post treatment, Disp: 30 g, Rfl: 1  Past Medical History: Past Medical History:  Diagnosis Date  . AAA (abdominal aortic aneurysm) without rupture ([place])   . Abdominal aneurysm (HCC)    3.5 cm  . Actinic keratosis 08/26/2020   right lateral chest  . Anxiety   . Basal cell carcinoma 09/18/2018   left upper back/one excised, one superficial  . Basal cell carcinoma 11/28/2018   left post shoulder  . Basal  cell carcinoma 08/26/2020   left medial pretibial, [facility] 10/2020  . Chronic kidney disease    stage 3 / kidney stones  . Colon polyp   . Depression   . Diverticulitis   . Diverticulosis   . Hyperlipemia   . Hypertension   . Lipoma   . Melanoma ([place]) 05/08/2018   in situ at right upper back/excision  . Neoplasm    benign, colon  . Onychomycosis   . Osteoarthritis   . Osteoarthritis   . Rheumatic fever   . Rheumatic fever   . Sleep apnea   . Squamous cell carcinoma of skin 10/13/2020   right lateral chest, [facility] 12/09/20  . 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([place])   . Venous stasis   . Venous stasis   . Vitamin B 12 deficiency     Tobacco Use: Social History   Tobacco Use  Smoking Status Former Smoker  Smokeless Tobacco Former Systems developer  . Types: Chew  . Quit date: 11/29/1990    Labs: Recent Review Flowsheet Data   There is no flowsheet data to display.      Pulmonary Assessment Scores:  Pulmonary Assessment Scores    Row Name 09/27/20 1602 12/13/20 1450       ADL UCSD   ADL Phase Entry Exit    SOB Score total 35 9    Rest 1 0    Walk 1 0    Stairs 3 1    Bath 1 1    Dress 1 0    Shop 1 1         CAT Score   CAT Score 4 14         mMRC Score   mMRC Score 0 1           UCSD: Self-administered rating of dyspnea associated with activities of daily living (ADLs) 6-point scale (0 = "not at all" to 5 = "maximal or unable to do because of breathlessness")  Scoring Scores range from 0 to 120.  Minimally important difference is 5 units  CAT: CAT can identify the health impairment of COPD patients and is better correlated with disease progression.  CAT has a scoring range of zero to 40. The CAT score is classified into four groups of low (less than 10), medium (10 - 20), high (21-30) and very high (31-40) based on the impact level of disease on health status. A CAT score over 10 suggests significant symptoms.  A worsening CAT score could be explained by an exacerbation,  poor medication adherence, poor inhaler technique, or progression of COPD or comorbid conditions.  CAT MCID is 2 points  mMRC: mMRC (Modified Medical Research Council) Dyspnea Scale is used to assess the degree of baseline functional disability in patients of respiratory disease due to dyspnea. No minimal important difference is established. A decrease in score of 1  point or greater is considered a positive change.   Pulmonary Function Assessment:   Exercise Target Goals: Exercise Program Goal: Individual exercise prescription set using results from initial 6 min walk test and THRR while considering  patient's activity barriers and safety.   Exercise Prescription Goal: Initial exercise prescription builds to 30-45 minutes a day of aerobic activity, 2-3 days per week.  Home exercise guidelines will be given to patient during program as part of exercise prescription that the participant will acknowledge.  Education: Aerobic Exercise: - Group verbal and visual presentation on the components of exercise prescription. Introduces F.I.T.T principle from ACSM for exercise prescriptions.  Reviews F.I.T.T. principles of aerobic exercise including progression. Written material given at graduation.   Education: Resistance Exercise: - Group verbal and visual presentation on the components of exercise prescription. Introduces F.I.T.T principle from ACSM for exercise prescriptions  Reviews F.I.T.T. principles of resistance exercise including progression. Written material given at graduation. Flowsheet Row Pulmonary Rehab from 11/24/2020 in [facility] Cardiac and Pulmonary Rehab  Date 11/03/20  Educator AS  Instruction Review Code 1- Verbalizes Understanding       Education: Exercise & Equipment Safety: - Individual verbal instruction and demonstration of equipment use and safety with use of the equipment. Flowsheet Row Pulmonary Rehab from 11/24/2020 in [facility] Cardiac and Pulmonary Rehab  Date 09/27/20   Educator AS  Instruction Review Code 1- Verbalizes Understanding      Education: Exercise Physiology & General Exercise Guidelines: - Group verbal and written instruction with models to review the exercise physiology of the cardiovascular system and associated critical values. Provides general exercise guidelines with specific guidelines to those with heart or lung disease.  Flowsheet Row Pulmonary Rehab from 11/24/2020 in [facility] Cardiac and Pulmonary Rehab  Date 10/27/20  Educator [facility]  Instruction Review Code 1- Verbalizes Understanding      Education: Flexibility, Balance, Mind/Body Relaxation: - Group verbal and visual presentation with interactive activity on the components of exercise prescription. Introduces F.I.T.T principle from ACSM for exercise prescriptions. Reviews F.I.T.T. principles of flexibility and balance exercise training including progression. Also discusses the mind body connection.  Reviews various relaxation techniques to help reduce and manage stress (i.e. Deep breathing, progressive muscle relaxation, and visualization). Balance handout provided to take home. Written material given at graduation.   Activity Barriers & Risk Stratification:  Activity Barriers & Cardiac Risk Stratification - 09/24/20 1309      Activity Barriers & Cardiac Risk Stratification   Activity Barriers Right Knee Replacement;Left Knee Replacement;Joint Problems;Muscular Weakness;Shortness of Breath;Other (comment);Arthritis    Comments Thoracic Ascending Aortic Aneurysm- keep BP below 135/80           6 Minute Walk:  6 Minute Walk    Row Name 09/27/20 1548 12/09/20 1428       6 Minute Walk   Phase Initial Discharge    Distance 895 feet 1250 feet    Distance % Change - 39.7 %    Distance Feet Change - 355 ft    Walk Time 6 minutes 6 minutes    # of Rest Breaks 0 0    MPH 1.7 2.37    METS 1.8 2.42    RPE 11 11    Perceived Dyspnea  0 0    VO2 Peak 6.26 8.45    Symptoms No No     Resting HR 63 bpm 61 bpm    Resting BP 124/74 126/76    Resting Oxygen Saturation  95 % 97 %  Exercise Oxygen Saturation  during 6 min walk 91 % 84 %    Max Ex. HR 96 bpm 86 bpm    Max Ex. BP 152/84 174/76    2 Minute Post BP 124/78 136/74         Interval HR   1 Minute HR 84 74    2 Minute HR 88 73    3 Minute HR 89 73    4 Minute HR 94 82    5 Minute HR 96 77    6 Minute HR 94 86    2 Minute Post HR 82 66    Interval Heart Rate? Yes Yes         Interval Oxygen   Interval Oxygen? Yes Yes    Baseline Oxygen Saturation % 95 % 97 %    1 Minute Oxygen Saturation % 96 % 93 %    1 Minute Liters of Oxygen 3 L 0 L  Room Air    2 Minute Oxygen Saturation % 95 % 88 %    2 Minute Liters of Oxygen 3 L 0 L    3 Minute Oxygen Saturation % 92 % 84 %    3 Minute Liters of Oxygen 3 L 0 L    4 Minute Oxygen Saturation % 91 % 86 %    4 Minute Liters of Oxygen 3 L 0 L    5 Minute Oxygen Saturation % 91 % 90 %    5 Minute Liters of Oxygen 3 L 0 L    6 Minute Oxygen Saturation % 91 % 91 %    6 Minute Liters of Oxygen 3 L 0 L    2 Minute Post Oxygen Saturation % 98 % 95 %    2 Minute Post Liters of Oxygen 3 L 0 L          Oxygen Initial Assessment:  Oxygen Initial Assessment - 09/27/20 1607      Home Oxygen   Home Oxygen Device Home Concentrator;E-Tanks    Sleep Oxygen Prescription CPAP    Home Resting Oxygen Prescription Continuous    Liters per minute 3      Intervention   Short Term Goals To learn and exhibit compliance with exercise, home and travel O2 prescription;To learn and understand importance of monitoring SPO2 with pulse oximeter and demonstrate accurate use of the pulse oximeter.;To learn and understand importance of maintaining oxygen saturations>88%;To learn and demonstrate proper pursed lip breathing techniques or other breathing techniques.;To learn and demonstrate proper use of respiratory medications    Long  Term Goals Exhibits compliance with exercise, home and  travel O2 prescription;Verbalizes importance of monitoring SPO2 with pulse oximeter and return demonstration;Maintenance of O2 saturations>88%;Exhibits proper breathing techniques, such as pursed lip breathing or other method taught during program session;Compliance with respiratory medication;Demonstrates proper use of MDI's           Oxygen Re-Evaluation:  Oxygen Re-Evaluation    Row Name 09/29/20 1429 11/18/20 1412 12/16/20 1516         Program Oxygen Prescription   Program Oxygen Prescription - Continuous None     Liters per minute - 2 -     Comments - Lucianne Lei has been weaning off oxygen while doing exercise. Lucianne Lei has been able to maintain sats in 90s without oxygen.           Home Oxygen   Home Oxygen Device Home Concentrator;E-Tanks Home Concentrator;E-Tanks Home Concentrator;E-Tanks     Sleep Oxygen Prescription  CPAP CPAP CPAP     Liters per minute - 2 2     Home Exercise Oxygen Prescription Continuous Continuous None     Liters per minute 3 2  as needed -     Home Resting Oxygen Prescription Continuous Continuous None     Liters per minute 3 0  as needed -     Compliance with Home Oxygen Use Yes Yes Yes           Goals/Expected Outcomes   Short Term Goals To learn and exhibit compliance with exercise, home and travel O2 prescription;To learn and understand importance of monitoring SPO2 with pulse oximeter and demonstrate accurate use of the pulse oximeter.;To learn and understand importance of maintaining oxygen saturations>88%;To learn and demonstrate proper pursed lip breathing techniques or other breathing techniques. To learn and understand importance of maintaining oxygen saturations>88%;To learn and understand importance of monitoring SPO2 with pulse oximeter and demonstrate accurate use of the pulse oximeter. To learn and understand importance of maintaining oxygen saturations>88%;To learn and understand importance of monitoring SPO2 with pulse oximeter and demonstrate  accurate use of the pulse oximeter.     Long  Term Goals Exhibits compliance with exercise, home and travel O2 prescription;Verbalizes importance of monitoring SPO2 with pulse oximeter and return demonstration;Maintenance of O2 saturations>88%;Exhibits proper breathing techniques, such as pursed lip breathing or other method taught during program session Maintenance of O2 saturations>88%;Verbalizes importance of monitoring SPO2 with pulse oximeter and return demonstration Maintenance of O2 saturations>88%;Verbalizes importance of monitoring SPO2 with pulse oximeter and return demonstration     Comments Reviewed PLB technique with pt.  Talked about how it works and it's importance in maintaining their exercise saturations. Lucianne Lei has been using no oxygen for a few sessions now while exercising. He is excited that he is able to do exercise without requiring oxygen. His oxygen on the treadmill will stay 88 percent and above but sometimes will fall to no lower than 85 percent. He has not gone more than 5 minutes below 85 percent. Lucianne Lei uses his CPAP at night but otherwise has been able to go without oxygen.  His sats have stayed above 88% during exercise.     Goals/Expected Outcomes Short: Become more profiecient at using PLB.   Long: Become independent at using PLB. Short: continue to wean oxygen as tolerated. Long: get off of oxygen and monitor spo2 independently. Short: talk with Dr about CPAP Long: stay off oxygen and monitor SpO2            Oxygen Discharge (Final Oxygen Re-Evaluation):  Oxygen Re-Evaluation - 12/16/20 1516      Program Oxygen Prescription   Program Oxygen Prescription None    Comments Lucianne Lei has been able to maintain sats in 90s without oxygen.      Home Oxygen   Home Oxygen Device Home Concentrator;E-Tanks    Sleep Oxygen Prescription CPAP    Liters per minute 2    Home Exercise Oxygen Prescription None    Home Resting Oxygen Prescription None    Compliance with Home Oxygen Use Yes       Goals/Expected Outcomes   Short Term Goals To learn and understand importance of maintaining oxygen saturations>88%;To learn and understand importance of monitoring SPO2 with pulse oximeter and demonstrate accurate use of the pulse oximeter.    Long  Term Goals Maintenance of O2 saturations>88%;Verbalizes importance of monitoring SPO2 with pulse oximeter and return demonstration    Comments Lucianne Lei uses his CPAP at night but  otherwise has been able to go without oxygen.  His sats have stayed above 88% during exercise.    Goals/Expected Outcomes Short: talk with Dr about CPAP Long: stay off oxygen and monitor SpO2           Initial Exercise Prescription:  Initial Exercise Prescription - 09/27/20 1500      Date of Initial Exercise RX and Referring Provider   Date 09/27/20    Referring Provider Caryl Comes      Oxygen   Oxygen Continuous    Liters 3      Treadmill   MPH 1.5    Grade 0    Minutes 15    METs 2      Recumbant Bike   Level 1    RPM 60    Minutes 15    METs 2      NuStep   Level 2    SPM 80    Minutes 15    METs 2      REL-XR   Level 1    Speed 50    Minutes 15    METs 2      Prescription Details   Frequency (times per week) 3    Duration Progress to 30 minutes of continuous aerobic without signs/symptoms of physical distress      Intensity   THRR 40-80% of Max Heartrate 97-130    Ratings of Perceived Exertion 11-15    Perceived Dyspnea 0-4      Resistance Training   Training Prescription Yes    Weight 3 lb    Reps 10-15           Perform Capillary Blood Glucose checks as needed.  Exercise Prescription Changes:  Exercise Prescription Changes    Row Name 09/27/20 1500 10/04/20 1300 10/19/20 1200 11/01/20 1600 11/15/20 1200     Response to Exercise   Blood Pressure (Admit) 124/74 128/70 122/82 136/72 122/62   Blood Pressure (Exercise) 152/84 128/66 132/72 132/62 140/72   Blood Pressure (Exit) 124/78 136/74 124/70 122/60 118/70   Heart Rate  (Admit) 63 bpm 70 bpm 78 bpm 60 bpm 72 bpm   Heart Rate (Exercise) 96 bpm 98 bpm 95 bpm 79 bpm 85 bpm   Heart Rate (Exit) 82 bpm 72 bpm 90 bpm 68 bpm 75 bpm   Oxygen Saturation (Admit) 95 % 96 % 97 % 98 % 94 %   Oxygen Saturation (Exercise) 91 % 94 % 91 % 96 % 90 %   Oxygen Saturation (Exit) 98 % 96 % 96 % 98 % 94 %   Rating of Perceived Exertion (Exercise) 11 13 12 13 13    Perceived Dyspnea (Exercise) 0 3 1 1 1    Symptoms none none none none none   Comments - second full day of exercise - - -   Duration - Progress to 30 minutes of  aerobic without signs/symptoms of physical distress Continue with 30 min of aerobic exercise without signs/symptoms of physical distress. Continue with 30 min of aerobic exercise without signs/symptoms of physical distress. Continue with 30 min of aerobic exercise without signs/symptoms of physical distress.   Intensity - THRR unchanged THRR unchanged THRR unchanged THRR unchanged     Progression   Progression - Continue to progress workloads to maintain intensity without signs/symptoms of physical distress. Continue to progress workloads to maintain intensity without signs/symptoms of physical distress. Continue to progress workloads to maintain intensity without signs/symptoms of physical distress. Continue to progress workloads to maintain  intensity without signs/symptoms of physical distress.   Average METs - 1.83 2 2.43 2.3     Resistance Training   Training Prescription - Yes Yes Yes Yes   Weight - 3 lb 4 lb 4 lb 4 lb   Reps - 10-15 10-15 10-15 10-15     Interval Training   Interval Training - No No No No     Oxygen   Oxygen - Continuous Continuous Continuous Continuous   Liters - 3 3 3  0  tried exercise without 02 today     Treadmill   MPH - 1.5 1.2 1.5 -   Grade - 0 0 0 -   Minutes - 15 15 15  -   METs - 2.15 2.15 2.15 -     Recumbant Bike   Level - 2 - 2 2   Minutes - 15 - 15 15   METs - - - 2.2 2.68     NuStep   Level - 2 - 4 4   Minutes  - 15 - 15 15   METs - 1.5 - 2.4 1.9     REL-XR   Level - 1 1 1  -   Speed - - 50 - -   Minutes - 15 15 15  -   METs - - 1.9 3 -   Row Name 11/24/20 1500 11/29/20 1100 12/15/20 1400         Response to Exercise   Blood Pressure (Admit) - 136/70 120/70     Blood Pressure (Exercise) - 146/74 124/60     Blood Pressure (Exit) - 118/70 110/70     Heart Rate (Admit) - 74 bpm 71 bpm     Heart Rate (Exercise) - 79 bpm 71 bpm     Heart Rate (Exit) - 67 bpm 70 bpm     Oxygen Saturation (Admit) - 90 % 91 %     Oxygen Saturation (Exercise) - 93 % 90 %     Oxygen Saturation (Exit) - 96 % 94 %     Rating of Perceived Exertion (Exercise) - 13 12     Perceived Dyspnea (Exercise) - 1 0     Symptoms - none none     Comments - exercising on room air -     Duration - Continue with 30 min of aerobic exercise without signs/symptoms of physical distress. Continue with 30 min of aerobic exercise without signs/symptoms of physical distress.     Intensity - THRR unchanged THRR unchanged           Progression   Progression - Continue to progress workloads to maintain intensity without signs/symptoms of physical distress. Continue to progress workloads to maintain intensity without signs/symptoms of physical distress.     Average METs - 2.02 2.6           Resistance Training   Training Prescription - Yes Yes     Weight - 5 lb 5 lb     Reps - 10-15 10-15           Interval Training   Interval Training - No No           Oxygen   Oxygen - -  Been on Room Air since 11/08/20 for exercise -           Treadmill   MPH - 1.5 1.5     Grade - 0 0     Minutes - 15 15     METs - 2.15 2.15  Recumbant Bike   Level - 3 -     Minutes - 15 -           NuStep   Level - 4 -     Minutes - 15 -     METs - 1.5 -           REL-XR   Level - 3 2     Minutes - 15 15     METs - 2.9 -           Home Exercise Plan   Plans to continue exercise at Home (comment)  Walking Home (comment)  Walking  Home (comment)  Walking     Frequency Add 2 additional days to program exercise sessions. Add 2 additional days to program exercise sessions. Add 2 additional days to program exercise sessions.     Initial Home Exercises Provided 11/24/20 11/24/20 11/24/20            Exercise Comments:  Exercise Comments    Row Name 09/29/20 1424           Exercise Comments First full day of exercise!  Patient was oriented to gym and equipment including functions, settings, policies, and procedures.  Patient's individual exercise prescription and treatment plan were reviewed.  All starting workloads were established based on the results of the 6 minute walk test done at initial orientation visit.  The plan for exercise progression was also introduced and progression will be customized based on patient's performance and goals.              Exercise Goals and Review:  Exercise Goals    Row Name 09/27/20 1555             Exercise Goals   Increase Physical Activity Yes       Intervention Provide advice, education, support and counseling about physical activity/exercise needs.;Develop an individualized exercise prescription for aerobic and resistive training based on initial evaluation findings, risk stratification, comorbidities and participant's personal goals.       Expected Outcomes Short Term: Attend rehab on a regular basis to increase amount of physical activity.;Long Term: Add in home exercise to make exercise part of routine and to increase amount of physical activity.;Long Term: Exercising regularly at least 3-5 days a week.       Increase Strength and Stamina Yes       Intervention Provide advice, education, support and counseling about physical activity/exercise needs.;Develop an individualized exercise prescription for aerobic and resistive training based on initial evaluation findings, risk stratification, comorbidities and participant's personal goals.       Expected Outcomes Short Term:  Increase workloads from initial exercise prescription for resistance, speed, and METs.;Short Term: Perform resistance training exercises routinely during rehab and add in resistance training at home;Long Term: Improve cardiorespiratory fitness, muscular endurance and strength as measured by increased METs and functional capacity (6MWT)       Able to understand and use rate of perceived exertion (RPE) scale Yes       Intervention Provide education and explanation on how to use RPE scale       Expected Outcomes Short Term: Able to use RPE daily in rehab to express subjective intensity level;Long Term:  Able to use RPE to guide intensity level when exercising independently       Able to understand and use Dyspnea scale Yes       Intervention Provide education and explanation on how to use Dyspnea scale  Expected Outcomes Short Term: Able to use Dyspnea scale daily in rehab to express subjective sense of shortness of breath during exertion;Long Term: Able to use Dyspnea scale to guide intensity level when exercising independently       Knowledge and understanding of Target Heart Rate Range (THRR) Yes       Intervention Provide education and explanation of THRR including how the numbers were predicted and where they are located for reference       Expected Outcomes Short Term: Able to state/look up THRR;Short Term: Able to use daily as guideline for intensity in rehab;Long Term: Able to use THRR to govern intensity when exercising independently       Able to check pulse independently Yes       Intervention Provide education and demonstration on how to check pulse in carotid and radial arteries.;Review the importance of being able to check your own pulse for safety during independent exercise       Expected Outcomes Short Term: Able to explain why pulse checking is important during independent exercise;Long Term: Able to check pulse independently and accurately       Understanding of Exercise  Prescription Yes       Intervention Provide education, explanation, and written materials on patient's individual exercise prescription       Expected Outcomes Short Term: Able to explain program exercise prescription;Long Term: Able to explain home exercise prescription to exercise independently              Exercise Goals Re-Evaluation :  Exercise Goals Re-Evaluation    Row Name 09/29/20 1424 10/04/20 1346 10/19/20 1241 10/28/20 1358 11/01/20 1648     Exercise Goal Re-Evaluation   Exercise Goals Review Increase Physical Activity;Able to understand and use rate of perceived exertion (RPE) scale;Knowledge and understanding of Target Heart Rate Range (THRR);Understanding of Exercise Prescription;Able to understand and use Dyspnea scale;Able to check pulse independently;Increase Strength and Stamina Increase Physical Activity;Increase Strength and Stamina;Understanding of Exercise Prescription Increase Physical Activity;Increase Strength and Stamina Increase Physical Activity;Increase Strength and Stamina Increase Physical Activity;Increase Strength and Stamina;Understanding of Exercise Prescription   Comments Reviewed RPE and dyspnea scales, THR and program prescription with pt today.  Pt voiced understanding and was given a copy of goals to take home. Eliseo Squires is off to a good start in rehab.  He has completed his frist two full days of exercise.  He even asked if he could come in a an extra day this week to make up a day for Thanksgiving.  We will continue to monitor his progress. Eliseo Squires attends consistently.  Oxygen has styaed in the 90's with 3L continuous flow.  he has increased to 4 lb for strength training.  We will continue to monitor progress. Lucianne Lei does walk at home for 20 min or more on days not at [facility] and works on breathing exercises. Lucianne Lei is doing well in rehab.  He is now up to level 4 on the NuStep and at 2.2 METs on the bike.  We will continue to monitor his progress.   Expected Outcomes Short:  Use RPE daily to regulate intensity. Long: Follow program prescription in THR. Short: Continue to attend rehab regularly  Long: continue to follow program prescription Short:  continue to attend consistently Long :  improve overall stamina Short: continue to walk at home and monitor oxygen levels Short: Continue to increase workload on treadmill  Long; Continue to improve stamina.   [place] Name 11/15/20 1237 11/18/20 1406 11/24/20 1514  11/29/20 1132 12/15/20 1444     Exercise Goal Re-Evaluation   Exercise Goals Review - Increase Physical Activity;Increase Strength and Stamina;Understanding of Exercise Prescription - Increase Physical Activity;Increase Strength and Stamina;Understanding of Exercise Prescription -   Comments Lucianne Lei has tried some exercsie without oxygen supplement.  His oxygen levels stayed above 90 on seated machines.  We will monitor progress. Lucianne Lei has been doing great. He is walking at home, both inside and outside- he has a treadmill he can use at home. He walks for about 25 minutes at a time once-twice/day. He finds it tolerable the 2nd time around but a little more fatigued. He is aware of the weather restrictions. Monitors oxygen at home and knows he cannot fall below 88%. EP will go over home exercise guidelines with patient in the next week. Reviewed home exercise with pt today.  Pt plans to walk for exercise.  Reviewed THR, pulse, RPE, sign and symptoms, pulse oximetery and when to call 911 or MD.  Also discussed weather considerations and indoor options.  Pt voiced understanding. Lucianne Lei is doing well in rehab.  He is now up to 5 lb handweights.  We will continue to monitor his progress. Lucianne Lei improved his post walk test by 355 feet!  he will finish [facility] in 3 more sessions.   Expected Outcomes Short: continue to attend conistently Long: improve overall MET level Short: Go over home exercise Long: Obtain ability to exercise independently at home with no complications - Short: start to add in  more walking at home Long: Continue to improve stamina Short : complete LW program Long: maintain exercise on his own          Discharge Exercise Prescription (Final Exercise Prescription Changes):  Exercise Prescription Changes - 12/15/20 1400      Response to Exercise   Blood Pressure (Admit) 120/70    Blood Pressure (Exercise) 124/60    Blood Pressure (Exit) 110/70    Heart Rate (Admit) 71 bpm    Heart Rate (Exercise) 71 bpm    Heart Rate (Exit) 70 bpm    Oxygen Saturation (Admit) 91 %    Oxygen Saturation (Exercise) 90 %    Oxygen Saturation (Exit) 94 %    Rating of Perceived Exertion (Exercise) 12    Perceived Dyspnea (Exercise) 0    Symptoms none    Duration Continue with 30 min of aerobic exercise without signs/symptoms of physical distress.    Intensity THRR unchanged      Progression   Progression Continue to progress workloads to maintain intensity without signs/symptoms of physical distress.    Average METs 2.6      Resistance Training   Training Prescription Yes    Weight 5 lb    Reps 10-15      Interval Training   Interval Training No      Treadmill   MPH 1.5    Grade 0    Minutes 15    METs 2.15      REL-XR   Level 2    Minutes 15      Home Exercise Plan   Plans to continue exercise at Home (comment)   Walking   Frequency Add 2 additional days to program exercise sessions.    Initial Home Exercises Provided 11/24/20           Nutrition:  Target Goals: Understanding of nutrition guidelines, daily intake of sodium <1535m, cholesterol <2030m calories 30% from fat and 7% or less from saturated fats, daily  to have 5 or more servings of fruits and vegetables.  Education: All About Nutrition: -Group instruction provided by verbal, written material, interactive activities, discussions, models, and posters to present general guidelines for heart healthy nutrition including fat, fiber, MyPlate, the role of sodium in heart healthy nutrition,  utilization of the nutrition label, and utilization of this knowledge for meal planning. Follow up email sent as well. Written material given at graduation. Flowsheet Row Pulmonary Rehab from 11/24/2020 in [facility] Cardiac and Pulmonary Rehab  Date 11/24/20  Educator [facility]  Instruction Review Code 1- Verbalizes Understanding      Biometrics:  Pre Biometrics - 09/27/20 1556      Pre Biometrics   Height 6' 1.5" (1.867 m)    Weight 254 lb 9.6 oz (115.5 kg)    BMI (Calculated) 33.13    Single Leg Stand 30 seconds           Post Biometrics - 12/09/20 1434       Post  Biometrics   Height 6' 1.5" (1.867 m)    Weight 257 lb (116.6 kg)    BMI (Calculated) 33.44    Single Leg Stand 30 seconds           Nutrition Therapy Plan and Nutrition Goals:  Nutrition Therapy & Goals - 10/20/20 1419      Nutrition Therapy   Diet heart healthy, low Na, pulmonary MNT    Protein (specify units) 100g    Fiber 30 grams    Whole Grain Foods 3 servings    Saturated Fats 12 max. grams    Fruits and Vegetables 8 servings/day    Sodium 1.5 grams      Personal Nutrition Goals   Nutrition Goal ST: limit red meat to 2-3x/week LT: He would like to maintain his weight around 240-250lbs    Comments Lucianne Lei reports his hemoglobin was low and his energy has been down - he just started taking iron supplements today and rpeorts he is feeling better than he did earlier today. He no longer has to take his fluid pill. His wife was a Marine scientist and keeps him on track. B: scrambled egg (3 whole eggs, no fat and no salt added) - he will sometimes have some honey or ketchup and water L: varied: soup (vegetbale soup with some hamburger meat that his wife makes) or banana and peanut butter sandwich or chicken salad and crackers. D: usually his biggest meal: meat and two vegetables - brocooli, green beans, butter beans and corn, peas - he salts some of his food, but started today. He reports his doctor said to eat more chicken and plant  foods. Discussed heart healthy eating, getting a variety of plant foods, lower sodium MNT, and the importance of meeting his calorie and protein needs. He would like to try rotisserie chicken instead of red meat - discussed how that is high in sodium and that he shouldn't do that for every meal, but can pre-prepare some chicken breasts roasted in the oven with some vegetables for ease. three main things to work on - increased plant intake, lowered red meat intake, limited Na.      Intervention Plan   Intervention Prescribe, educate and counsel regarding individualized specific dietary modifications aiming towards targeted core components such as weight, hypertension, lipid management, diabetes, heart failure and other comorbidities.;Nutrition handout(s) given to patient.    Expected Outcomes Short Term Goal: Understand basic principles of dietary content, such as calories, fat, sodium, cholesterol and nutrients.;Short Term Goal:  A plan has been developed with personal nutrition goals set during dietitian appointment.;Long Term Goal: Adherence to prescribed nutrition plan.           Nutrition Assessments:  Nutrition Assessments - 09/27/20 1604      MEDFICTS Scores   Pre Score 24          MEDIFICTS Score Key:  ?70 Need to make dietary changes   40-70 Heart Healthy Diet  ? 40 Therapeutic Level Cholesterol Diet   Picture Your Plate Scores:  <81 Unhealthy dietary pattern with much room for improvement.  41-50 Dietary pattern unlikely to meet recommendations for good health and room for improvement.  51-60 More healthful dietary pattern, with some room for improvement.   >60 Healthy dietary pattern, although there may be some specific behaviors that could be improved.   Nutrition Goals Re-Evaluation:  Nutrition Goals Re-Evaluation    [place] Name 10/28/20 1355 11/18/20 1530 12/16/20 1446         Goals   Nutrition Goal - ST: limit red meat to 2-3x/week LT: He would like to maintain  his weight around 240-250lbs -     Comment Lucianne Lei has said his wife and Dr have recommended low salt and that is how she prepares food at home.  He is also taking an iron supplement. Lucianne Lei has said his wife and Dr have recommended low salt and that is how she prepares food at home.  He is also taking an iron supplement. Lucianne Lei has no new goals or questions at this time. He has completely cut additional salt from his diet. Lucianne Lei and is wife continue no salt.  His weight has stayed steady.     Expected Outcome Short:  continue recommendations from RD  Long: reach goal weight Short: Continue to work with RD on personal goals Long: Maintain healthy diet appropriate for patient Short: continue heart healthy diet Long: reach goal weight            Nutrition Goals Discharge (Final Nutrition Goals Re-Evaluation):  Nutrition Goals Re-Evaluation - 12/16/20 1446      Goals   Comment Lucianne Lei and is wife continue no salt.  His weight has stayed steady.    Expected Outcome Short: continue heart healthy diet Long: reach goal weight           Psychosocial: Target Goals: Acknowledge presence or absence of significant depression and/or stress, maximize coping skills, provide positive support system. Participant is able to verbalize types and ability to use techniques and skills needed for reducing stress and depression.   Education: Stress, Anxiety, and Depression - Group verbal and visual presentation to define topics covered.  Reviews how body is impacted by stress, anxiety, and depression.  Also discusses healthy ways to reduce stress and to treat/manage anxiety and depression.  Written material given at graduation. Flowsheet Row Pulmonary Rehab from 11/24/2020 in [facility] Cardiac and Pulmonary Rehab  Date 10/20/20  Educator [facility]  Instruction Review Code 1- [employer] Understanding      Education: Sleep Hygiene -Provides group verbal and written instruction about how sleep can affect your health.  Define sleep hygiene,  discuss sleep cycles and impact of sleep habits. Review good sleep hygiene tips.    Initial Review & Psychosocial Screening:  Initial Psych Review & Screening - 09/24/20 1308      Initial Review   Current issues with Current Stress Concerns    Source of Stress Concerns Unable to participate in former interests or hobbies;Unable to perform yard/household activities;Chronic  Illness      Family Dynamics   Good Support System? Yes   wife     Barriers   Psychosocial barriers to participate in program There are no identifiable barriers or psychosocial needs.      Screening Interventions   Interventions Encouraged to exercise;To provide support and resources with identified psychosocial needs;Provide feedback about the scores to participant    Expected Outcomes Short Term goal: Utilizing psychosocial counselor, staff and physician to assist with identification of specific Stressors or current issues interfering with healing process. Setting desired goal for each stressor or current issue identified.;Long Term Goal: Stressors or current issues are controlled or eliminated.;Short Term goal: Identification and review with participant of any Quality of Life or Depression concerns found by scoring the questionnaire.;Long Term goal: The participant improves quality of Life and PHQ9 Scores as seen by post scores and/or verbalization of changes           Quality of Life Scores:  Scores of 19 and below usually indicate a poorer quality of life in these areas.  A difference of  2-3 points is a clinically meaningful difference.  A difference of 2-3 points in the total score of the Quality of Life Index has been associated with significant improvement in overall quality of life, self-image, physical symptoms, and general health in studies assessing change in quality of life.  PHQ-9: Recent Review Flowsheet Data    Depression screen [facility] 2/9 12/13/2020 09/27/2020   Decreased Interest 0 0   Down, Depressed,  Hopeless 0 0   PHQ - 2 Score 0 0   Altered sleeping 0 0   Tired, decreased energy 1 0   Change in appetite 0 0   Feeling bad or failure about yourself  0 1   Trouble concentrating 0 1   Moving slowly or fidgety/restless 0 0   Suicidal thoughts 0 0   PHQ-9 Score 1 2   Difficult doing work/chores Not difficult at all Not difficult at all     Interpretation of Total Score  Total Score Depression Severity:  1-4 = Minimal depression, 5-9 = Mild depression, 10-14 = Moderate depression, 15-19 = Moderately severe depression, 20-27 = Severe depression   Psychosocial Evaluation and Intervention:  Psychosocial Evaluation - 09/24/20 1324      Psychosocial Evaluation & Interventions   Comments Mable reports doing well thanks to his supportive wife who was a Marine scientist. He had Covid in August of this year and now he is on oxygen. He states his shortness of breath has gotten better but he is looking forward to coming to pulmonary rehab to learn more and feel better. 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 It is stable and he is being followed by vascular, but he can't get his blood pressure above 135/80s and doesn't know what all he can/can't do. He is looking forward to coming to learn more and increase his stamina.    Expected Outcomes Short: attend Pulmonary Rehab for education and exercise. Long: develop and maintain positive self care habits.    Continue Psychosocial Services  Follow up required by staff           Psychosocial Re-Evaluation:  Psychosocial Re-Evaluation    [place] Name 10/28/20 1401 11/18/20 1411 12/16/20 1439         Psychosocial Re-Evaluation   Current issues with - - None Identified     Comments Eliseo Squires has been sleeping better since starting [facility].  He also says stress is  low - he has a big dog that he spends time with to lower stress. Lucianne Lei is doing great. Denies any sleep, stress or emotional concerns at this time. He has good support from family and  wife. Stays compliant with Xanax that he takes for anxiety. He reently saw his doctor who gave him good results about his lungs which he is very happy about. He is enjoying coming to rehab and feels it is helping him tremendously. Lucianne Lei has enjoyed the [employer].  He is able to exercise without oxygen now.  He goes in October to check on aneurysms.  He is still on Xanax but can tell exercise has helped as well.     Expected Outcomes Short: continue to exercise Long:  maintain positive outlook Short: Continue attendance with [facility] Long: Continue to use exercise for positive attitude and stress management Short: complete LW program Long: maintain positive outlook     Interventions - Encouraged to attend Pulmonary Rehabilitation for the exercise -     Continue Psychosocial Services  - Follow up required by staff -            Psychosocial Discharge (Final Psychosocial Re-Evaluation):  Psychosocial Re-Evaluation - 12/16/20 1439      Psychosocial Re-Evaluation   Current issues with None Identified    Comments Lucianne Lei has enjoyed the [employer] program.  He is able to exercise without oxygen now.  He goes in October to check on aneurysms.  He is still on Xanax but can tell exercise has helped as well.    Expected Outcomes Short: complete LW program Long: maintain positive outlook           Education: Education Goals: Education classes will be provided on a weekly basis, covering required topics. Participant will state understanding/return demonstration of topics presented.  Learning Barriers/Preferences:  Learning Barriers/Preferences - 09/24/20 1308      Learning Barriers/Preferences   Learning Barriers None    Learning Preferences None           General Pulmonary Education Topics:  Infection Prevention: - Provides verbal and written material to individual with discussion of infection control including proper hand washing and proper equipment cleaning during exercise session. Flowsheet Row  Pulmonary Rehab from 11/24/2020 in [facility] Cardiac and Pulmonary Rehab  Date 09/27/20  Educator [facility]  Instruction Review Code 1- Verbalizes Understanding      Falls Prevention: - Provides verbal and written material to individual with discussion of falls prevention and safety. Flowsheet Row Pulmonary Rehab from 11/24/2020 in [facility] Cardiac and Pulmonary Rehab  Date 09/27/20  Educator [facility]  Instruction Review Code 1- Verbalizes Understanding      Chronic Lung Disease Review: - Group verbal instruction with posters, models, PowerPoint presentations and videos,  to review new updates, new respiratory medications, new advancements in procedures and treatments. Providing information on websites and "800" numbers for continued self-education. Includes information about supplement oxygen, available portable oxygen systems, continuous and intermittent flow rates, oxygen safety, concentrators, and Medicare reimbursement for oxygen. Explanation of Pulmonary Drugs, including class, frequency, complications, importance of spacers, rinsing mouth after steroid MDI's, and proper cleaning methods for nebulizers. Review of basic lung anatomy and physiology related to function, structure, and complications of lung disease. Review of risk factors. Discussion about methods for diagnosing sleep apnea and types of masks and machines for OSA. Includes a review of the use of types of environmental controls: home humidity, furnaces, filters, dust mite/pet prevention, HEPA vacuums. Discussion about weather changes, air quality  and the benefits of nasal washing. Instruction on Warning signs, infection symptoms, calling MD promptly, preventive modes, and value of vaccinations. Review of effective airway clearance, coughing and/or vibration techniques. Emphasizing that all should Create an Action Plan. Written material given at graduation. Flowsheet Row Pulmonary Rehab from 11/24/2020 in [facility] Cardiac and Pulmonary Rehab  Date 10/13/20   Educator [facility]  Instruction Review Code 1- Verbalizes Understanding      AED/CPR: - Group verbal and written instruction with the use of models to demonstrate the basic use of the AED with the basic ABC's of resuscitation.    Anatomy and Cardiac Procedures: - Group verbal and visual presentation and models provide information about basic cardiac anatomy and function. Reviews the testing methods done to diagnose heart disease and the outcomes of the test results. Describes the treatment choices: Medical Management, Angioplasty, or Coronary Bypass Surgery for treating various heart conditions including Myocardial Infarction, Angina, Valve Disease, and Cardiac Arrhythmias.  Written material given at graduation. Flowsheet Row Pulmonary Rehab from 11/24/2020 in [facility] Cardiac and Pulmonary Rehab  Date 11/03/20  Educator [facility]  Instruction Review Code 1- Verbalizes Understanding      Medication Safety: - Group verbal and visual instruction to review commonly prescribed medications for heart and lung disease. Reviews the medication, class of the drug, and side effects. Includes the steps to properly store meds and maintain the prescription regimen.  Written material given at graduation.   Other: -Provides group and verbal instruction on various topics (see comments)   Knowledge Questionnaire Score:  Knowledge Questionnaire Score - 12/13/20 1449      Knowledge Questionnaire Score   Post Score 18/18 correct            Core Components/Risk Factors/Patient Goals at Admission:  Personal Goals and Risk Factors at Admission - 09/27/20 1557      Core Components/Risk Factors/Patient Goals on Admission    Weight Management Weight Maintenance    Improve shortness of breath with ADL's Yes    Intervention Provide education, individualized exercise plan and daily activity instruction to help decrease symptoms of SOB with activities of daily living.    Expected Outcomes Short Term: Improve  cardiorespiratory fitness to achieve a reduction of symptoms when performing ADLs;Long Term: Be able to perform more ADLs without symptoms or delay the onset of symptoms    Hypertension Yes    Intervention Provide education on lifestyle modifcations including regular physical activity/exercise, weight management, moderate sodium restriction and increased consumption of fresh fruit, vegetables, and low fat dairy, alcohol moderation, and smoking cessation.;Monitor prescription use compliance.    Lipids Yes    Intervention Provide education and support for participant on nutrition & aerobic/resistive exercise along with prescribed medications to achieve LDL <69m, HDL >443m    Expected Outcomes Short Term: Participant states understanding of desired cholesterol values and is compliant with medications prescribed. Participant is following exercise prescription and nutrition guidelines.;Long Term: Cholesterol controlled with medications as prescribed, with individualized exercise RX and with personalized nutrition plan. Value goals: LDL < 7042mHDL > 40 mg.           Education:Diabetes - Individual verbal and written instruction to review signs/symptoms of diabetes, desired ranges of glucose level fasting, after meals and with exercise. Acknowledge that pre and post exercise glucose checks will be done for 3 sessions at entry of program.   Know Your Numbers and Heart Failure: - Group verbal and visual instruction to discuss disease risk factors for cardiac and pulmonary disease  and treatment options.  Reviews associated critical values for Overweight/Obesity, Hypertension, Cholesterol, and Diabetes.  Discusses basics of heart failure: signs/symptoms and treatments.  Introduces Heart Failure Zone chart for action plan for heart failure.  Written material given at graduation. Flowsheet Row Pulmonary Rehab from 11/24/2020 in [facility] Cardiac and Pulmonary Rehab  Date 10/06/20  Educator [facility]  Instruction Review  Code 1- Verbalizes Understanding      Core Components/Risk Factors/Patient Goals Review:   Goals and Risk Factor Review    Row Name 10/28/20 1350 11/18/20 1414 12/16/20 1435         Core Components/Risk Factors/Patient Goals Review   Personal Goals Review Weight Management/Obesity;Hypertension;Lipids Weight Management/Obesity;Hypertension Improve shortness of breath with ADL's;Develop more efficient breathing techniques such as purse lipped breathing and diaphragmatic breathing and practicing self-pacing with activity.;Hypertension     Review Lucianne Lei saw his Dr earlier in the week and his lung function has improved some since starting [facility].  He has noticed that his breathing is easier and deeper.  He is sleeping better.  He would like to get off oxygen altogether.  He reports taking medications as directed.  He does check BP at home and sends to Dr Caryl Comes every week. Lucianne Lei take his BP takes at home, At home 435-686H systolic, 68H diastolic. Stable at rehab, usualy ranges in the FGBMS-111B systolic. Weight has been stable around 255 lb, right now he wants to maintain until he gets his strength back first. Then he will work on losing some weight. He has been weaning off O2 with his exercise and he's been feeling good. Lucianne Lei states he appreciates the program being customized and staff helping him to reach his goals.  He doesnt have to use oxygen anymore.  He uses his CPAP at night.  He fees he was challenged at appropraite levels for his condition.     Expected Outcomes Short:  continue current regimen Long: be able to get off oxygen Short: Continue taking BP and vitals at home Long: Continue to manage lifestyle risk factors Short: complete LW program Long: maintain exercise on his own            Core Components/Risk Factors/Patient Goals at Discharge (Final Review):   Goals and Risk Factor Review - 12/16/20 1435      Core Components/Risk Factors/Patient Goals Review   Personal Goals Review Improve  shortness of breath with ADL's;Develop more efficient breathing techniques such as purse lipped breathing and diaphragmatic breathing and practicing self-pacing with activity.;Hypertension    Review Lucianne Lei states he appreciates the program being customized and staff helping him to reach his goals.  He doesnt have to use oxygen anymore.  He uses his CPAP at night.  He fees he was challenged at appropraite levels for his condition.    Expected Outcomes Short: complete LW program Long: maintain exercise on his own           ITP Comments:  ITP Comments    Row Name 09/24/20 1329 09/29/20 1423 10/13/20 1059 11/10/20 0733 12/08/20 1003   ITP Comments Initial telephone orientation completed. Diagnosis can be found in [facility] 11/2. EP orientation scheduled for Monday 11/15 at 1pm First full day of exercise!  Patient was oriented to gym and equipment including functions, settings, policies, and procedures.  Patient's individual exercise prescription and treatment plan were reviewed.  All starting workloads were established based on the results of the 6 minute walk test done at initial orientation visit.  The plan for exercise progression was also  introduced and progression will be customized based on patient's performance and goals. 30 Day review completed. Medical Director ITP review done, changes made as directed, and signed approval by Medical Director.  New to program 30 Day review completed. Medical Director ITP review done, changes made as directed, and signed approval by Medical Director. 30 Day review completed. Medical Director ITP review done, changes made as directed, and signed approval by Medical Director.   Sunray Name 12/22/20 1350           ITP Comments Colen graduated today from  rehab with 36 sessions completed.  Details of the patient's exercise prescription and what He needs to do in order to continue the prescription and progress were discussed with patient.  Patient was given a copy of  prescription and goals.  Patient verbalized understanding.  Bradan plans to continue to exercise by joining the [employer].              Comments: discharge ITP

## 2020-12-22 NOTE — Progress Notes (Signed)
Daily Session Note  Patient Details  Name: Alexander Howe MRN: [id number] Date of Birth: [birth year] Referring Provider:   Flowsheet Row Pulmonary Rehab from 09/27/2020 in [facility] Cardiac and Pulmonary Rehab  Referring Provider Alexander Howe      Encounter Date: 12/22/2020  Check In:  Session Check In - 12/22/20 1348      Check-In   Supervising physician immediately available to respond to emergencies See telemetry face sheet for immediately available ER MD    Location [facility]-Cardiac & Pulmonary Rehab    Staff Present Alexander Howe, MPA, RN;Alexander Howe, [place] Exercise Physiologist    Virtual Visit No    Medication changes reported     No    Fall or balance concerns reported    No    Warm-up and Cool-down Performed on first and last piece of equipment    Resistance Training Performed Yes    VAD Patient? No    PAD/SET Patient? No      Pain Assessment   Currently in Pain? No/denies              Social History   Tobacco Use  Smoking Status Former Smoker  Smokeless Tobacco Former Systems developer  . Types: Chew  . Quit date: 11/29/1990    Goals Met:  Independence with exercise equipment Exercise tolerated well No report of cardiac concerns or symptoms Strength training completed today  Goals Unmet:  Not Applicable  Comments:  Alexander Howe graduated today from  rehab with 36 sessions completed.  Details of the patient's exercise prescription and what He needs to do in order to continue the prescription and progress were discussed with patient.  Patient was given a copy of prescription and goals.  Patient verbalized understanding.  Derrich plans to continue to exercise by joining the [employer].    Dr. Emily Howe is Medical Director for [facility].

## 2020-12-22 NOTE — Progress Notes (Signed)
Discharge Progress Report  Patient Details  Name: Alexander Howe MRN: [id number] Date of Birth: [birth year] Referring Provider:   Flowsheet Row Pulmonary Rehab from 09/27/2020 in [facility] Cardiac and Pulmonary Rehab  Referring Provider Caryl Comes       Number of Visits: 36  Reason for Discharge:  Patient reached a stable level of exercise. Patient independent in their exercise. Patient has met program and personal goals.  Smoking History:  Social History   Tobacco Use  Smoking Status Former Smoker  Smokeless Tobacco Former Systems developer  . Types: Chew  . Quit date: 11/29/1990    Diagnosis:  Chronic respiratory failure with hypoxia [facility])  ADL UCSD:  Pulmonary Assessment Scores    Row Name 09/27/20 1602 12/13/20 1450       ADL UCSD   ADL Phase Entry Exit    SOB Score total 35 9    Rest 1 0    Walk 1 0    Stairs 3 1    Bath 1 1    Dress 1 0    Shop 1 1         CAT Score   CAT Score 4 14         mMRC Score   mMRC Score 0 1           Initial Exercise Prescription:  Initial Exercise Prescription - 09/27/20 1500      Date of Initial Exercise RX and Referring Provider   Date 09/27/20    Referring Provider Caryl Comes      Oxygen   Oxygen Continuous    Liters 3      Treadmill   MPH 1.5    Grade 0    Minutes 15    METs 2      Recumbant Bike   Level 1    RPM 60    Minutes 15    METs 2      NuStep   Level 2    SPM 80    Minutes 15    METs 2      REL-XR   Level 1    Speed 50    Minutes 15    METs 2      Prescription Details   Frequency (times per week) 3    Duration Progress to 30 minutes of continuous aerobic without signs/symptoms of physical distress      Intensity   THRR 40-80% of Max Heartrate 97-130    Ratings of Perceived Exertion 11-15    Perceived Dyspnea 0-4      Resistance Training   Training Prescription Yes    Weight 3 lb    Reps 10-15           Discharge Exercise Prescription (Final Exercise Prescription Changes):  Exercise  Prescription Changes - 12/15/20 1400      Response to Exercise   Blood Pressure (Admit) 120/70    Blood Pressure (Exercise) 124/60    Blood Pressure (Exit) 110/70    Heart Rate (Admit) 71 bpm    Heart Rate (Exercise) 71 bpm    Heart Rate (Exit) 70 bpm    Oxygen Saturation (Admit) 91 %    Oxygen Saturation (Exercise) 90 %    Oxygen Saturation (Exit) 94 %    Rating of Perceived Exertion (Exercise) 12    Perceived Dyspnea (Exercise) 0    Symptoms none    Duration Continue with 30 min of aerobic exercise without signs/symptoms of physical distress.    Intensity THRR  unchanged      Progression   Progression Continue to progress workloads to maintain intensity without signs/symptoms of physical distress.    Average METs 2.6      Resistance Training   Training Prescription Yes    Weight 5 lb    Reps 10-15      Interval Training   Interval Training No      Treadmill   MPH 1.5    Grade 0    Minutes 15    METs 2.15      REL-XR   Level 2    Minutes 15      Home Exercise Plan   Plans to continue exercise at Home (comment)   Walking   Frequency Add 2 additional days to program exercise sessions.    Initial Home Exercises Provided 11/24/20           Functional Capacity:  6 Minute Walk    Row Name 09/27/20 1548 12/09/20 1428       6 Minute Walk   Phase Initial Discharge    Distance 895 feet 1250 feet    Distance % Change -- 39.7 %    Distance Feet Change -- 355 ft    Walk Time 6 minutes 6 minutes    # of Rest Breaks 0 0    MPH 1.7 2.37    METS 1.8 2.42    RPE 11 11    Perceived Dyspnea  0 0    VO2 Peak 6.26 8.45    Symptoms No No    Resting HR 63 bpm 61 bpm    Resting BP 124/74 126/76    Resting Oxygen Saturation  95 % 97 %    Exercise Oxygen Saturation  during 6 min walk 91 % 84 %    Max Ex. HR 96 bpm 86 bpm    Max Ex. BP 152/84 174/76    2 Minute Post BP 124/78 136/74         Interval HR   1 Minute HR 84 74    2 Minute HR 88 73    3 Minute HR 89 73     4 Minute HR 94 82    5 Minute HR 96 77    6 Minute HR 94 86    2 Minute Post HR 82 66    Interval Heart Rate? Yes Yes         Interval Oxygen   Interval Oxygen? Yes Yes    Baseline Oxygen Saturation % 95 % 97 %    1 Minute Oxygen Saturation % 96 % 93 %    1 Minute Liters of Oxygen 3 L 0 L  Room Air    2 Minute Oxygen Saturation % 95 % 88 %    2 Minute Liters of Oxygen 3 L 0 L    3 Minute Oxygen Saturation % 92 % 84 %    3 Minute Liters of Oxygen 3 L 0 L    4 Minute Oxygen Saturation % 91 % 86 %    4 Minute Liters of Oxygen 3 L 0 L    5 Minute Oxygen Saturation % 91 % 90 %    5 Minute Liters of Oxygen 3 L 0 L    6 Minute Oxygen Saturation % 91 % 91 %    6 Minute Liters of Oxygen 3 L 0 L    2 Minute Post Oxygen Saturation % 98 % 95 %    2 Minute  Post Liters of Oxygen 3 L 0 L           Psychological, QOL, Others - Outcomes: PHQ 2/9: Depression screen [facility] 2/9 12/13/2020 09/27/2020  Decreased Interest 0 0  Down, Depressed, Hopeless 0 0  PHQ - 2 Score 0 0  Altered sleeping 0 0  Tired, decreased energy 1 0  Change in appetite 0 0  Feeling bad or failure about yourself  0 1  Trouble concentrating 0 1  Moving slowly or fidgety/restless 0 0  Suicidal thoughts 0 0  PHQ-9 Score 1 2  Difficult doing work/chores Not difficult at all Not difficult at all    Quality of Life:  Nutrition & Weight - Outcomes:  Pre Biometrics - 09/27/20 1556      Pre Biometrics   Height 6' 1.5" (1.867 m)    Weight 254 lb 9.6 oz (115.5 kg)    BMI (Calculated) 33.13    Single Leg Stand 30 seconds           Post Biometrics - 12/09/20 1434       Post  Biometrics   Height 6' 1.5" (1.867 m)    Weight 257 lb (116.6 kg)    BMI (Calculated) 33.44    Single Leg Stand 30 seconds           Nutrition:  Nutrition Therapy & Goals - 10/20/20 1419      Nutrition Therapy   Diet heart healthy, low Na, pulmonary MNT    Protein (specify units) 100g    Fiber 30 grams    Whole Grain Foods 3  servings    Saturated Fats 12 max. grams    Fruits and Vegetables 8 servings/day    Sodium 1.5 grams      Personal Nutrition Goals   Nutrition Goal ST: limit red meat to 2-3x/week LT: He would like to maintain his weight around 240-250lbs    Comments Lucianne Lei reports his hemoglobin was low and his energy has been down - he just started taking iron supplements today and rpeorts he is feeling better than he did earlier today. He no longer has to take his fluid pill. His wife was a Marine scientist and keeps him on track. B: scrambled egg (3 whole eggs, no fat and no salt added) - he will sometimes have some honey or ketchup and water L: varied: soup (vegetbale soup with some hamburger meat that his wife makes) or banana and peanut butter sandwich or chicken salad and crackers. D: usually his biggest meal: meat and two vegetables - brocooli, green beans, butter beans and corn, peas - he salts some of his food, but started today. He reports his doctor said to eat more chicken and plant foods. Discussed heart healthy eating, getting a variety of plant foods, lower sodium MNT, and the importance of meeting his calorie and protein needs. He would like to try rotisserie chicken instead of red meat - discussed how that is high in sodium and that he shouldn't do that for every meal, but can pre-prepare some chicken breasts roasted in the oven with some vegetables for ease. three main things to work on - increased plant intake, lowered red meat intake, limited Na.      Intervention Plan   Intervention Prescribe, educate and counsel regarding individualized specific dietary modifications aiming towards targeted core components such as weight, hypertension, lipid management, diabetes, heart failure and other comorbidities.;Nutrition handout(s) given to patient.    Expected Outcomes Short Term Goal: Understand basic principles  of dietary content, such as calories, fat, sodium, cholesterol and nutrients.;Short Term Goal: A plan has  been developed with personal nutrition goals set during dietitian appointment.;Long Term Goal: Adherence to prescribed nutrition plan.           Nutrition Discharge:  Nutrition Assessments - 09/27/20 1604      MEDFICTS Scores   Pre Score 24           Education Questionnaire Score:  Knowledge Questionnaire Score - 12/13/20 1449      Knowledge Questionnaire Score   Post Score 18/18 correct           Goals reviewed with patient; copy given to patient.

## 2021-01-25 ENCOUNTER — Ambulatory Visit: Payer: Medicare Other | Admitting: Dermatology

## 2021-01-25 ENCOUNTER — Other Ambulatory Visit: Payer: Self-pay

## 2021-01-25 ENCOUNTER — Encounter: Payer: Self-pay | Admitting: Dermatology

## 2021-01-25 DIAGNOSIS — L578 Other skin changes due to chronic exposure to nonionizing radiation: Secondary | ICD-10-CM | POA: Diagnosis not present

## 2021-01-25 DIAGNOSIS — L570 Actinic keratosis: Secondary | ICD-10-CM

## 2021-01-25 DIAGNOSIS — L905 Scar conditions and fibrosis of skin: Secondary | ICD-10-CM

## 2021-01-25 DIAGNOSIS — Z872 Personal history of diseases of the skin and subcutaneous tissue: Secondary | ICD-10-CM | POA: Diagnosis not present

## 2021-01-25 NOTE — Patient Instructions (Addendum)
For Left lower leg  Recommend Serica moisturizing scar formula cream every night or [facility] brand or Mederma silicone scar sheet every night for the first year after a scar appears to help with scar remodeling if desired. Scars remodel on their own for a full year.   Cryotherapy Aftercare  . Wash gently with soap and water everyday.   Marland Kitchen Apply Vaseline and Band-Aid daily until healed.    If you have any questions or concerns for your doctor, please call our main line at [phone] and press option 4 to reach your doctor's medical assistant. If no one answers, please leave a voicemail as directed and we will return your call as soon as possible. Messages left after 4 pm will be answered the following business day.   You may also send [place] a message via [place]. We typically respond to MyChart messages within 1-2 business days.  For prescription refills, please ask your pharmacy to contact our office. Our fax number is [phone].  If you have an urgent issue when the clinic is closed that cannot wait until the next business day, you can page your doctor at the number below.    Please note that while we do our best to be available for urgent issues outside of office hours, we are not available 24/7.   If you have an urgent issue and are unable to reach [place], you may choose to seek medical care at your doctor's office, retail clinic, urgent care center, or emergency room.  If you have a medical emergency, please immediately call 911 or go to the emergency department.  Pager Numbers  - Dr. Nehemiah Massed: [phone]  - Dr. Laurence Ferrari: [phone]  - Dr. Nicole Kindred: [phone]  In the event of inclement weather, please call our main line at [phone] for an update on the status of any delays or closures.  Dermatology Medication Tips: Please keep the boxes that topical medications come in in order to help keep track of the instructions about where and how to use these. Pharmacies typically  print the medication instructions only on the boxes and not directly on the medication tubes.   If your medication is too expensive, please contact our office at [phone] option 4 or send [place] a message through [place].   We are unable to tell what your co-pay for medications will be in advance as this is different depending on your insurance coverage. However, we may be able to find a substitute medication at lower cost or fill out paperwork to get insurance to cover a needed medication.   If a prior authorization is required to get your medication covered by your insurance company, please allow [place] 1-2 business days to complete this process.  Drug prices often vary depending on where the prescription is filled and some pharmacies may offer cheaper prices.  The website www.goodrx.com contains coupons for medications through different pharmacies. The prices here do not account for what the cost may be with help from insurance (it may be cheaper with your insurance), but the website can give you the price if you did not use any insurance.  - You can print the associated coupon and take it with your prescription to the pharmacy.  - You may also stop by our office during regular business hours and pick up a GoodRx coupon card.  - If you need your prescription sent electronically to a different pharmacy, notify our office through [facility] or by phone at [phone] option 4.  5-Fluorouracil/Calcipotriene Patient Education   Actinic keratoses are the dry, red scaly spots on the skin caused by sun damage. A portion of these spots can turn into skin cancer with time, and treating them can help prevent development of skin cancer.   Treatment of these spots requires removal of the defective skin cells. There are various ways to remove actinic keratoses, including freezing with liquid nitrogen, treatment with creams, or treatment with a blue light procedure in the office.    5-fluorouracil cream is a topical cream used to treat actinic keratoses. It works by interfering with the growth of abnormal fast-growing skin cells, such as actinic keratoses. These cells peel off and are replaced by healthy ones.   5-fluorouracil/calcipotriene is a combination of the 5-fluorouracil cream with a vitamin D analog cream called calcipotriene. The calcipotriene alone does not treat actinic keratoses. However, when it is combined with 5-fluorouracil, it helps the 5-fluorouracil treat the actinic keratoses much faster so that the same results can be achieved with a much shorter treatment time.  INSTRUCTIONS FOR 5-FLUOROURACIL/CALCIPOTRIENE CREAM:   5-fluorouracil/calcipotriene cream typically only needs to be used for 4-7 days. A thin layer should be applied twice a day to the treatment areas recommended by your physician.   If your physician prescribed you separate tubes of 5-fluourouracil and calcipotriene, apply a thin layer of 5-fluorouracil followed by a thin layer of calcipotriene.   Avoid contact with your eyes, nostrils, and mouth. Do not use 5-fluorouracil/calcipotriene cream on infected or open wounds.   You will develop redness, irritation and some crusting at areas where you have pre-cancer damage/actinic keratoses. IF YOU DEVELOP PAIN, BLEEDING, OR SIGNIFICANT CRUSTING, STOP THE TREATMENT EARLY - you have already gotten a good response and the actinic keratoses should clear up well.  Wash your hands after applying 5-fluorouracil 5% cream on your skin.   A moisturizer or sunscreen with a minimum SPF 30 should be applied each morning.   Once you have finished the treatment, you can apply a thin layer of Vaseline twice a day to irritated areas to soothe and calm the areas more quickly. If you experience significant discomfort, contact your physician.  For some patients it is necessary to repeat the treatment for best results.  SIDE EFFECTS: When using  5-fluorouracil/calcipotriene cream, you may have mild irritation, such as redness, dryness, swelling, or a mild burning sensation. This usually resolves within 2 weeks. The more actinic keratoses you have, the more redness and inflammation you can expect during treatment. Eye irritation has been reported rarely. If this occurs, please let us know.  If you have any trouble using this cream, please call the office. If you have any other questions about this information, please do not hesitate to ask me before you leave the office.   Instructions for [facility] Medications  One or more of your medications was sent to the [facility] mail order compounding pharmacy. You will receive an email from them and can purchase the medicine through that link. It will then be mailed to your home at the address you confirmed. If for any reason you do not receive an email from them, please check your spam folder. If you still do not find the email, please let us know. [facility] phone number is [phone].

## 2021-01-25 NOTE — Progress Notes (Signed)
Follow-Up Visit   Subjective  Alexander Howe is a 75 y.o. male who presents for the following: Actinic Keratosis (6 weeks f/u hx of Aks on face, ears, neck ).  He did not treat actinic damage at scalp with 5FU/calcipotriene due to bumping his head on the car causing a wound. He was also not sure which cream he was supposed to use.   The following portions of the chart were reviewed this encounter and updated as appropriate:   Tobacco  Allergies  Meds  Problems  Med Hx  Surg Hx  Fam Hx      Review of Systems:  No other skin or systemic complaints except as noted in HPI or Assessment and Plan.  Objective  Well appearing patient in no apparent distress; mood and affect are within normal limits.  A focused examination was performed including face, scalp, ears, left lower leg Relevant physical exam findings are noted in the Assessment and Plan.  Objective  R ear: Erythematous thin papules/macules with gritty scale.   Objective  Left Lower Leg: Hypertrophic scar   Assessment & Plan  AK (actinic keratosis) R ear  Prior to procedure, discussed risks of blister formation, small wound, skin dyspigmentation, or rare scar following cryotherapy.    Destruction of lesion - R ear Complexity: simple   Destruction method: cryotherapy   Informed consent: discussed and consent obtained   Timeout:  patient name, date of birth, surgical site, and procedure verified Lesion destroyed using liquid nitrogen: Yes   Region frozen until ice ball extended beyond lesion: Yes   Outcome: patient tolerated procedure well with no complications   Post-procedure details: wound care instructions given    Scar Left Lower Leg  Recommend Serica moisturizing scar formula cream every night or [employer] brand or Mederma silicone scar sheet every night for the first year after a scar appears to help with scar remodeling if desired. Scars remodel on their own for a full year.   Actinic Damage -  Severe, chronic, not at goal, secondary to cumulative UV radiation exposure over time - diffuse scaly erythematous macules and papules with underlying dyspigmentation - Discussed Prescription "Field Treatment" for Severe, Chronic Confluent Actinic Changes with Pre-Cancerous Actinic Keratoses Field treatment involves treatment of an entire area of skin that has confluent Actinic Changes (Sun/ Ultraviolet light damage) and PreCancerous Actinic Keratoses by method of PhotoDynamic Therapy (PDT) and/or prescription Topical Chemotherapy agents such as 5-fluorouracil, 5-fluorouracil/calcipotriene, and/or imiquimod.  The purpose is to decrease the number of clinically evident and subclinical PreCancerous lesions to prevent progression to development of skin cancer by chemically destroying early precancer changes that may or may not be visible.  It has been shown to reduce the risk of developing skin cancer in the treated area. As a result of treatment, redness, scaling, crusting, and open sores may occur during treatment course. One or more than one of these methods may be used and may have to be used several times to control, suppress and eliminate the PreCancerous changes. Discussed treatment course, expected reaction, and possible side effects. - Recommend daily broad spectrum sunscreen SPF 30+ to sun-exposed areas, reapply every 2 hours as needed.  - Staying in the shade or wearing long sleeves, sun glasses (UVA+UVB protection) and wide brim hats (4-inch brim around the entire circumference of the hat) are also recommended. - Call for new or changing lesions. - Start Efudex Calcipotriene cream to scalp x 7 days, repeat the treatment on the scalp in 1 month  Plan on treating face in the future    Return in about 3 months (around 04/27/2021) for Aks .  I, Marye Round, CMA, am acting as scribe for Forest Gleason, MD .  Documentation: I have reviewed the above documentation for accuracy and completeness, and  I agree with the above.  Forest Gleason, MD

## 2021-04-14 ENCOUNTER — Ambulatory Visit: Payer: Medicare Other | Admitting: Dermatology

## 2021-08-18 ENCOUNTER — Ambulatory Visit: Payer: Medicare Other | Admitting: Dermatology

## 2021-12-15 ENCOUNTER — Ambulatory Visit: Payer: Medicare Other | Admitting: Dermatology

## 2022-03-15 ENCOUNTER — Ambulatory Visit: Payer: Medicare Other | Admitting: Dermatology

## 2022-03-15 DIAGNOSIS — L570 Actinic keratosis: Secondary | ICD-10-CM | POA: Diagnosis not present

## 2022-03-15 DIAGNOSIS — L814 Other melanin hyperpigmentation: Secondary | ICD-10-CM | POA: Diagnosis not present

## 2022-03-15 DIAGNOSIS — C4441 Basal cell carcinoma of skin of scalp and neck: Secondary | ICD-10-CM | POA: Diagnosis not present

## 2022-03-15 DIAGNOSIS — L821 Other seborrheic keratosis: Secondary | ICD-10-CM | POA: Diagnosis not present

## 2022-03-15 DIAGNOSIS — L820 Inflamed seborrheic keratosis: Secondary | ICD-10-CM | POA: Diagnosis not present

## 2022-03-15 DIAGNOSIS — L578 Other skin changes due to chronic exposure to nonionizing radiation: Secondary | ICD-10-CM | POA: Diagnosis not present

## 2022-03-15 DIAGNOSIS — D485 Neoplasm of uncertain behavior of skin: Secondary | ICD-10-CM | POA: Diagnosis not present

## 2022-03-15 NOTE — Patient Instructions (Addendum)
Start 5-fluorouracil/calcipotriene cream twice a day for 7-14 days to affected areas including scalp.  Patient provided with handout reviewing treatment course and side effects and advised to call or message [place] on MyChart with any concerns. ? ?5-Fluorouracil/Calcipotriene Patient Education  ? ?Actinic keratoses are the dry, red scaly spots on the skin caused by sun damage. A portion of these spots can turn into skin cancer with time, and treating them can help prevent development of skin cancer.  ? ?Treatment of these spots requires removal of the defective skin cells. There are various ways to remove actinic keratoses, including freezing with liquid nitrogen, treatment with creams, or treatment with a blue light procedure in the office.  ? ?5-fluorouracil cream is a topical cream used to treat actinic keratoses. It works by interfering with the growth of abnormal fast-growing skin cells, such as actinic keratoses. These cells peel off and are replaced by healthy ones.  ? ?5-fluorouracil/calcipotriene is a combination of the 5-fluorouracil cream with a vitamin D analog cream called calcipotriene. The calcipotriene alone does not treat actinic keratoses. However, when it is combined with 5-fluorouracil, it helps the 5-fluorouracil treat the actinic keratoses much faster so that the same results can be achieved with a much shorter treatment time. ? ?INSTRUCTIONS FOR 5-FLUOROURACIL/CALCIPOTRIENE CREAM:  ? ?5-fluorouracil/calcipotriene cream typically only needs to be used for 7-14 days. A thin layer should be applied twice a day to the treatment areas recommended by your physician.  ? ?If your physician prescribed you separate tubes of 5-fluourouracil and calcipotriene, apply a thin layer of 5-fluorouracil followed by a thin layer of calcipotriene.  ? ?Avoid contact with your eyes, nostrils, and mouth. Do not use 5-fluorouracil/calcipotriene cream on infected or open wounds.  ? ?You will develop redness, irritation and  some crusting at areas where you have pre-cancer damage/actinic keratoses. IF YOU DEVELOP PAIN, BLEEDING, OR SIGNIFICANT CRUSTING, STOP THE TREATMENT EARLY - you have already gotten a good response and the actinic keratoses should clear up well. ? ?Wash your hands after applying 5-fluorouracil 5% cream on your skin.  ? ?A moisturizer or sunscreen with a minimum SPF 30 should be applied each morning.  ? ?Once you have finished the treatment, you can apply a thin layer of Vaseline twice a day to irritated areas to soothe and calm the areas more quickly. If you experience significant discomfort, contact your physician. ? ?For some patients it is necessary to repeat the treatment for best results. ? ?SIDE EFFECTS: When using 5-fluorouracil/calcipotriene cream, you may have mild irritation, such as redness, dryness, swelling, or a mild burning sensation. This usually resolves within 2 weeks. The more actinic keratoses you have, the more redness and inflammation you can expect during treatment. Eye irritation has been reported rarely. If this occurs, please let us know.  ?If you have any trouble using this cream, please call the office. If you have any other questions about this information, please do not hesitate to ask me before you leave the office. ? ? ?Wound Care Instructions ? ?Cleanse wound gently with soap and water once a day then pat dry with clean gauze. Apply a thing coat of Petrolatum (petroleum jelly, "Vaseline") over the wound (unless you have an allergy to this). We recommend that you use a new, sterile tube of Vaseline. Do not pick or remove scabs. Do not remove the yellow or white "healing tissue" from the base of the wound. ? ?Cover the wound with fresh, clean, nonstick gauze and secure with paper  tape. You may use Band-Aids in place of gauze and tape if the would is small enough, but would recommend trimming much of the tape off as there is often too much. Sometimes Band-Aids can irritate the  skin. ? ?You should call the office for your biopsy report after 1 week if you have not already been contacted. ? ?If you experience any problems, such as abnormal amounts of bleeding, swelling, significant bruising, significant pain, or evidence of infection, please call the office immediately. ? ?FOR ADULT SURGERY PATIENTS: If you need something for pain relief you may take 1 extra strength Tylenol (acetaminophen) AND 2 Ibuprofen ('200mg'$  each) together every 4 hours as needed for pain. (do not take these if you are allergic to them or if you have a reason you should not take them.) Typically, you may only need pain medication for 1 to 3 days.  ? ?Cryotherapy Aftercare ? ?Wash gently with soap and water everyday.   ?Apply Vaseline and Band-Aid daily until healed.  ? ? ?If You Need Anything After Your Visit ? ?If you have any questions or concerns for your doctor, please call our main line at [phone] and press option 4 to reach your doctor's medical assistant. If no one answers, please leave a voicemail as directed and we will return your call as soon as possible. Messages left after 4 pm will be answered the following business day.  ? ?You may also send [place] a message via MyChart. We typically respond to MyChart messages within 1-2 business days. ? ?For prescription refills, please ask your pharmacy to contact our office. Our fax number is [phone]. ? ?If you have an urgent issue when the clinic is closed that cannot wait until the next business day, you can page your doctor at the number below.   ? ?Please note that while we do our best to be available for urgent issues outside of office hours, we are not available 24/7.  ? ?If you have an urgent issue and are unable to reach [place], you may choose to seek medical care at your doctor's office, retail clinic, urgent care center, or emergency room. ? ?If you have a medical emergency, please immediately call 911 or go to the emergency department. ? ?Pager  Numbers ? ?- Dr. Nehemiah Massed: [phone] ? ?- Dr. Laurence Ferrari: [phone] ? ?- Dr. Nicole Kindred: [phone] ? ?In the event of inclement weather, please call our main line at [phone] for an update on the status of any delays or closures. ? ?Dermatology Medication Tips: ?Please keep the boxes that topical medications come in in order to help keep track of the instructions about where and how to use these. Pharmacies typically print the medication instructions only on the boxes and not directly on the medication tubes.  ? ?If your medication is too expensive, please contact our office at [phone] option 4 or send [place] a message through [place].  ? ?We are unable to tell what your co-pay for medications will be in advance as this is different depending on your insurance coverage. However, we may be able to find a substitute medication at lower cost or fill out paperwork to get insurance to cover a needed medication.  ? ?If a prior authorization is required to get your medication covered by your insurance company, please allow [place] 1-2 business days to complete this process. ? ?Drug prices often vary depending on where the prescription is filled and some pharmacies may offer cheaper prices. ? ?The website www.goodrx.com contains coupons  for medications through different pharmacies. The prices here do not account for what the cost may be with help from insurance (it may be cheaper with your insurance), but the website can give you the price if you did not use any insurance.  ?- You can print the associated coupon and take it with your prescription to the pharmacy.  ?- You may also stop by our office during regular business hours and pick up a GoodRx coupon card.  ?- If you need your prescription sent electronically to a different pharmacy, notify our office through [facility] or by phone at [phone] option 4. ? ? ? ? ?Si Usted Necesita Algo Despu?s de Su Visita ? ?Tambi?n puede enviarnos un mensaje a trav?s de  MyChart. Por lo general respondemos a los mensajes de MyChart en el transcurso de 1 a 2 d?as h?biles. ? ?Para renovar recetas, por favor pida a su farmacia que se ponga en contacto con nuestra oficina. Nuestro n?mero d

## 2022-03-15 NOTE — Progress Notes (Signed)
? ?Follow-Up Visit ?  ?Subjective  ?Alexander Howe is a 76 y.o. male who presents for the following: Skin Problem (Patient here today for spots at scalp and neck. Patient's wife has noticed a few dark spots at scalp that they are concerned about since he has a hx of melanoma in situ. Spots at neck irritated, possibly from c-pap machine. Patient would also like arms checked. ). ? ?Patient had rx sent in months ago for 5FU/calcipotriene to use at scalp but patient never did treatment.  ? ?The following portions of the chart were reviewed this encounter and updated as appropriate:  ?  ?  ? ?Review of Systems:  No other skin or systemic complaints except as noted in HPI or Assessment and Plan. ? ?Objective  ?Well appearing patient in no apparent distress; mood and affect are within normal limits. ? ?All skin waist up examined. ? ?Scalp x 9, left hand dorsum x2 (11) ?Pink scaly papules ? ?right neck ?1.0 cm pink slightly pearly crusted macule ? ? ? ? ?right upper forehead ?5.0 mm dark brown waxy papule ? ? ? ? ?Right Elbow x 1 ?Scaly dark brown waxy papule ? ? ? ?Assessment & Plan  ?AK (actinic keratosis) (11) ?Scalp x 9, left hand dorsum x2 ? ?Actinic keratoses are precancerous spots that appear secondary to cumulative UV radiation exposure/sun exposure over time. They are chronic with expected duration over 1 year. A portion of actinic keratoses will progress to squamous cell carcinoma of the skin. It is not possible to reliably predict which spots will progress to skin cancer and so treatment is recommended to prevent development of skin cancer. ? ?Recommend daily broad spectrum sunscreen SPF 30+ to sun-exposed areas, reapply every 2 hours as needed.  ?Recommend staying in the shade or wearing long sleeves, sun glasses (UVA+UVB protection) and wide brim hats (4-inch brim around the entire circumference of the hat). ?Call for new or changing lesions.  ? ?- Start 5-fluorouracil/calcipotriene cream twice a day for 7-14  days to affected areas including scalp. Patient has Rx. ? ?5-fluorouracil/calcipotriene cream is is a type of field treatment used to treat precancers, thin skin cancers, and areas of sun damage. Expected reaction includes irritation and mild inflammation potentially progressing to more severe inflammation including redness, scaling, crusting and open sores/erosions.  If too much irritation occurs, ensure application of only a thin layer and decrease frequency of use to achieve a tolerable level of inflammation. Recommend applying Vaseline ointment to open sores as needed.  Minimize sun exposure while under treatment. Recommend daily broad spectrum sunscreen SPF 30+ to sun-exposed areas, reapply every 2 hours as needed.  ? ?   ? ? ?Destruction of lesion - Scalp x 9, left hand dorsum x2 ? ?Destruction method: cryotherapy   ?Informed consent: discussed and consent obtained   ?Lesion destroyed using liquid nitrogen: Yes   ?Region frozen until ice ball extended beyond lesion: Yes   ?Outcome: patient tolerated procedure well with no complications   ?Post-procedure details: wound care instructions given   ?Additional details:  Prior to procedure, discussed risks of blister formation, small wound, skin dyspigmentation, or rare scar following cryotherapy. Recommend Vaseline ointment to treated areas while healing.  ? ?Neoplasm of uncertain behavior of skin (2) ?right neck ? ?Skin / nail biopsy ?Type of biopsy: tangential   ?Informed consent: discussed and consent obtained   ?Timeout: patient name, date of birth, surgical site, and procedure verified   ?Procedure prep:  Patient was prepped  and draped in usual sterile fashion ?Prep type:  Isopropyl alcohol ?Anesthesia: the lesion was anesthetized in a standard fashion   ?Anesthetic:  1% lidocaine w/ epinephrine 1-100,000 buffered w/ 8.4% NaHCO3 ?Instrument used: flexible razor blade   ?Hemostasis achieved with: pressure, aluminum chloride and electrodesiccation   ?Outcome:  patient tolerated procedure well   ? ?Destruction of lesion ? ?Destruction method: electrodesiccation and curettage   ?Informed consent: discussed and consent obtained   ?Timeout:  patient name, date of birth, surgical site, and procedure verified ?Curettage performed in three different directions: Yes   ?Electrodesiccation performed over the curetted area: Yes   ?Final wound size (cm):  1.5 ?Hemostasis achieved with:  pressure, aluminum chloride and electrodesiccation ?Outcome: patient tolerated procedure well with no complications   ?Post-procedure details: wound care instructions given   ?Additional details:  Mupirocin ointment and Bandaid applied ?  ? ?Specimen 1 - Surgical pathology ?Differential Diagnosis: AK r/o BCC ? ?Check Margins: No ?1.0 cm pink slightly pearly crusted macule ? ?right upper forehead ? ?Skin / nail biopsy ?Type of biopsy: tangential   ?Informed consent: discussed and consent obtained   ?Timeout: patient name, date of birth, surgical site, and procedure verified   ?Procedure prep:  Patient was prepped and draped in usual sterile fashion ?Prep type:  Isopropyl alcohol ?Anesthesia: the lesion was anesthetized in a standard fashion   ?Anesthetic:  1% lidocaine w/ epinephrine 1-100,000 buffered w/ 8.4% NaHCO3 ?Instrument used: flexible razor blade   ?Hemostasis achieved with: pressure, aluminum chloride and electrodesiccation   ?Outcome: patient tolerated procedure well   ?Post-procedure details: wound care instructions given   ?Additional details:  Mupirocin ointment and Bandaid applied ?  ? ?Specimen 2 - Surgical pathology ?Differential Diagnosis: SK r/o Atypia ? ?Check Margins: No ?5.0 mm dark brown waxy papule ? ?Inflamed seborrheic keratosis ?Right Elbow x 1 ? ?Destruction of lesion - Right Elbow x 1 ? ?Destruction method: cryotherapy   ?Informed consent: discussed and consent obtained   ?Lesion destroyed using liquid nitrogen: Yes   ?Region frozen until ice ball extended beyond lesion:  Yes   ?Outcome: patient tolerated procedure well with no complications   ?Post-procedure details: wound care instructions given   ?Additional details:  Prior to procedure, discussed risks of blister formation, small wound, skin dyspigmentation, or rare scar following cryotherapy. Recommend Vaseline ointment to treated areas while healing.  ? ?Lentigines ?- Scattered tan macules ?- Due to sun exposure ?- Benign-appering, observe ?- Recommend daily broad spectrum sunscreen SPF 30+ to sun-exposed areas, reapply every 2 hours as needed. ?- Call for any changes ? ?Actinic Damage ?- chronic, secondary to cumulative UV radiation exposure/sun exposure over time ?- diffuse scaly erythematous macules with underlying dyspigmentation ?- Recommend daily broad spectrum sunscreen SPF 30+ to sun-exposed areas, reapply every 2 hours as needed.  ?- Recommend staying in the shade or wearing long sleeves, sun glasses (UVA+UVB protection) and wide brim hats (4-inch brim around the entire circumference of the hat). ?- Call for new or changing lesions. ? ?Seborrheic Keratoses ?- Stuck-on, waxy, tan-brown papules and/or plaques  ?- Benign-appearing ?- Discussed benign etiology and prognosis. ?- Observe ?- Call for any changes ? ?Return in about 5 months (around 08/15/2022) for TBSE. ? ?Graciella Belton, RMA, am acting as scribe for Brendolyn Patty, MD . ? ?Documentation: I have reviewed the above documentation for accuracy and completeness, and I agree with the above. ? ?Brendolyn Patty MD  ? ?

## 2022-03-20 ENCOUNTER — Telehealth: Payer: Self-pay

## 2022-03-20 NOTE — Telephone Encounter (Signed)
-----   Message from Brendolyn Patty, MD sent at 03/20/2022  1:37 PM EDT ----- ?1. Skin , right neck ?BASAL CELL CARCINOMA, NODULAR PATTERN, BASE INVOLVED ?2. Skin , right upper forehead ?PIGMENTED SEBORRHEIC KERATOSIS ? ?1. BCC skin cancer- already treated with EDC at time of biopsy ?2. Benign SK ? ?- please call patient ?

## 2022-03-20 NOTE — Telephone Encounter (Signed)
Tried to call patient with biopsy results. No answer and unable to leave message, mailbox full. ?

## 2022-03-23 ENCOUNTER — Telehealth: Payer: Self-pay

## 2022-03-23 NOTE — Telephone Encounter (Signed)
-----   Message from Brendolyn Patty, MD sent at 03/20/2022  1:37 PM EDT ----- ?1. Skin , right neck ?BASAL CELL CARCINOMA, NODULAR PATTERN, BASE INVOLVED ?2. Skin , right upper forehead ?PIGMENTED SEBORRHEIC KERATOSIS ? ?1. BCC skin cancer- already treated with EDC at time of biopsy ?2. Benign SK ? ?- please call patient ?

## 2022-03-23 NOTE — Telephone Encounter (Signed)
Tried calling pt about bx results and no answer or voicemail.  Emailed patient to call us about his bx results/sh ?

## 2022-03-24 ENCOUNTER — Encounter: Payer: Self-pay | Admitting: Dermatology

## 2022-08-31 ENCOUNTER — Ambulatory Visit: Payer: Medicare Other | Admitting: Dermatology

## 2022-08-31 DIAGNOSIS — Z85828 Personal history of other malignant neoplasm of skin: Secondary | ICD-10-CM | POA: Diagnosis not present

## 2022-08-31 DIAGNOSIS — C4441 Basal cell carcinoma of skin of scalp and neck: Secondary | ICD-10-CM | POA: Diagnosis not present

## 2022-08-31 DIAGNOSIS — L570 Actinic keratosis: Secondary | ICD-10-CM | POA: Diagnosis not present

## 2022-08-31 DIAGNOSIS — L578 Other skin changes due to chronic exposure to nonionizing radiation: Secondary | ICD-10-CM

## 2022-08-31 DIAGNOSIS — C4491 Basal cell carcinoma of skin, unspecified: Secondary | ICD-10-CM

## 2022-08-31 DIAGNOSIS — Z872 Personal history of diseases of the skin and subcutaneous tissue: Secondary | ICD-10-CM

## 2022-08-31 DIAGNOSIS — D229 Melanocytic nevi, unspecified: Secondary | ICD-10-CM

## 2022-08-31 DIAGNOSIS — Z1283 Encounter for screening for malignant neoplasm of skin: Secondary | ICD-10-CM | POA: Diagnosis not present

## 2022-08-31 DIAGNOSIS — D492 Neoplasm of unspecified behavior of bone, soft tissue, and skin: Secondary | ICD-10-CM

## 2022-08-31 DIAGNOSIS — L814 Other melanin hyperpigmentation: Secondary | ICD-10-CM

## 2022-08-31 DIAGNOSIS — L821 Other seborrheic keratosis: Secondary | ICD-10-CM

## 2022-08-31 DIAGNOSIS — Z86006 Personal history of melanoma in-situ: Secondary | ICD-10-CM | POA: Diagnosis not present

## 2022-08-31 DIAGNOSIS — L820 Inflamed seborrheic keratosis: Secondary | ICD-10-CM | POA: Diagnosis not present

## 2022-08-31 HISTORY — DX: Basal cell carcinoma of skin, unspecified: C44.91

## 2022-08-31 NOTE — Patient Instructions (Addendum)
- Start 5-fluorouracil cream twice a day for 4 days to affected areas including face, 7 days to scalp.  Reviewed course of treatment and expected reaction.  Patient advised to expect inflammation and crusting and advised that erosions are possible.  Patient advised to be diligent with sun protection during and after treatment. Handout with details of how to apply medication and what to expect provided. Counseled to keep medication out of reach of children and pets.   Wound Care Instructions  Cleanse wound gently with soap and water once a day then pat dry with clean gauze. Apply a thin coat of Petrolatum (petroleum jelly, "Vaseline") over the wound (unless you have an allergy to this). We recommend that you use a new, sterile tube of Vaseline. Do not pick or remove scabs. Do not remove the yellow or white "healing tissue" from the base of the wound.  Cover the wound with fresh, clean, nonstick gauze and secure with paper tape. You may use Band-Aids in place of gauze and tape if the wound is small enough, but would recommend trimming much of the tape off as there is often too much. Sometimes Band-Aids can irritate the skin.  You should call the office for your biopsy report after 1 week if you have not already been contacted.  If you experience any problems, such as abnormal amounts of bleeding, swelling, significant bruising, significant pain, or evidence of infection, please call the office immediately.  FOR ADULT SURGERY PATIENTS: If you need something for pain relief you may take 1 extra strength Tylenol (acetaminophen) AND 2 Ibuprofen ('200mg'$  each) together every 4 hours as needed for pain. (do not take these if you are allergic to them or if you have a reason you should not take them.) Typically, you may only need pain medication for 1 to 3 days.   Cryotherapy Aftercare  Wash gently with soap and water everyday.   Apply Vaseline and Band-Aid daily until healed.   Recommend taking Heliocare  sun protection supplement daily in sunny weather for additional sun protection. For maximum protection on the sunniest days, you can take up to 2 capsules of regular Heliocare OR take 1 capsule of Heliocare Ultra. For prolonged exposure (such as a full day in the sun), you can repeat your dose of the supplement 4 hours after your first dose. Heliocare can be purchased at [employer], at some [facility] or at [email or web address].    Melanoma ABCDEs  Melanoma is the most dangerous type of skin cancer, and is the leading cause of death from skin disease.  You are more likely to develop melanoma if you: Have light-colored skin, light-colored eyes, or red or blond hair Spend a lot of time in the sun Tan regularly, either outdoors or in a tanning bed Have had blistering sunburns, especially during childhood Have a close family member who has had a melanoma Have atypical moles or large birthmarks  Early detection of melanoma is key since treatment is typically straightforward and cure rates are extremely high if we catch it early.   The first sign of melanoma is often a change in a mole or a new dark spot.  The ABCDE system is a way of remembering the signs of melanoma.  A for asymmetry:  The two halves do not match. B for border:  The edges of the growth are irregular. C for color:  A mixture of colors are present instead of an even brown color. D for diameter:  Melanomas are usually (  but not always) greater than 24m - the size of a pencil eraser. E for evolution:  The spot keeps changing in size, shape, and color.  Please check your skin once per month between visits. You can use a small mirror in front and a large mirror behind you to keep an eye on the back side or your body.   If you see any new or changing lesions before your next follow-up, please call to schedule a visit.  Please continue daily skin protection including broad spectrum sunscreen SPF 30+ to sun-exposed areas,  reapplying every 2 hours as needed when you're outdoors.    Due to recent changes in healthcare laws, you may see results of your pathology and/or laboratory studies on MyChart before the doctors have had a chance to review them. We understand that in some cases there may be results that are confusing or concerning to you. Please understand that not all results are received at the same time and often the doctors may need to interpret multiple results in order to provide you with the best plan of care or course of treatment. Therefore, we ask that you please give u[place]2 business days to thoroughly review all your results before contacting the office for clarification. Should we see a critical lab result, you will be contacted sooner.   If You Need Anything After Your Visit  If you have any questions or concerns for your doctor, please call our main line at [phone] press option 4 to reach your doctor's medical assistant. If no one answers, please leave a voicemail as directed and we will return your call as soon as possible. Messages left after 4 pm will be answered the following business day.   You may also send [place] message via [facility] We typically respond to MyChart messages within 1-2 business days.  For prescription refills, please ask your pharmacy to contact our office. Our fax number is [phone]  If you have an urgent issue when the clinic is closed that cannot wait until the next business day, you can page your doctor at the number below.    Please note that while we do our best to be available for urgent issues outside of office hours, we are not available 24/7.   If you have an urgent issue and are unable to reach [place] you may choose to seek medical care at your doctor's office, retail clinic, urgent care center, or emergency room.  If you have a medical emergency, please immediately call 911 or go to the emergency department.  Pager Numbers  - Dr. KNehemiah Massed  [phone] - Dr. MLaurence Ferrari [phone] - Dr. SNicole Kindred [phone] In the event of inclement weather, please call our main line at [phone] an update on the status of any delays or closures.  Dermatology Medication Tips: Please keep the boxes that topical medications come in in order to help keep track of the instructions about where and how to use these. Pharmacies typically print the medication instructions only on the boxes and not directly on the medication tubes.   If your medication is too expensive, please contact our office at [phone] 4 or send [place] message through [place]   We are unable to tell what your co-pay for medications will be in advance as this is different depending on your insurance coverage. However, we may be able to find a substitute medication at lower cost or fill out paperwork to get insurance to cover a  needed medication.   If a prior authorization is required to get your medication covered by your insurance company, please allow [place] 1-2 business days to complete this process.  Drug prices often vary depending on where the prescription is filled and some pharmacies may offer cheaper prices.  The website www.goodrx.com contains coupons for medications through different pharmacies. The prices here do not account for what the cost may be with help from insurance (it may be cheaper with your insurance), but the website can give you the price if you did not use any insurance.  - You can print the associated coupon and take it with your prescription to the pharmacy.  - You may also stop by our office during regular business hours and pick up a GoodRx coupon card.  - If you need your prescription sent electronically to a different pharmacy, notify our office through [facility] or by phone at [phone] option 4.     Si Usted Necesita Algo Despus de Su Visita  Tambin puede enviarnos un mensaje a travs de Pharmacist, community. Por lo general  respondemos a los mensajes de MyChart en el transcurso de 1 a 2 das hbiles.  Para renovar recetas, por favor pida a su farmacia que se ponga en contacto con nuestra oficina. Harland Dingwall de fax es [place] [phone].  Si tiene un asunto urgente cuando la clnica est cerrada y que no puede esperar hasta el siguiente da hbil, puede llamar/localizar a su doctor(a) al nmero que aparece a continuacin.   Por favor, tenga en cuenta que aunque hacemos todo lo posible para estar disponibles para asuntos urgentes fuera del horario de [place], no estamos disponibles las 24 horas del da, los 7 das de la [place].   Si tiene un problema urgente y no puede comunicarse con nosotros, puede optar por buscar atencin mdica  en el consultorio de su doctor(a), en una clnica privada, en un centro de atencin urgente o en una sala de emergencias.  Si tiene Engineering geologist, por favor llame inmediatamente al 911 o vaya a la sala de emergencias.  Nmeros de bper  - Dr. Nehemiah Massed: [phone]  - Dra. Moye: [phone]  - Dra. Nicole Kindred: [phone]  En caso de inclemencias del [place], por favor llame a Johnsie Kindred principal al [phone] para una actualizacin sobre el [place] de cualquier retraso o cierre.  Consejos para la medicacin en dermatologa: Por favor, guarde las cajas en las que vienen los medicamentos de uso tpico para ayudarle a seguir las instrucciones sobre dnde y cmo usarlos. Las farmacias generalmente imprimen las instrucciones del medicamento slo en las cajas y no directamente en los tubos del [place].   Si su medicamento es muy caro, por favor, pngase en contacto con Zigmund Daniel llamando al [phone] y presione la opcin 4 o envenos un mensaje a travs de Pharmacist, community.   No podemos decirle cul ser su copago por los medicamentos por adelantado ya que esto es diferente dependiendo de la cobertura de su seguro. Sin embargo, es posible que podamos encontrar un  medicamento sustituto a Electrical engineer un formulario para que el seguro cubra el medicamento que se considera necesario.   Si se requiere una autorizacin previa para que su compaa de seguros [place] su medicamento, por favor permtanos de 1 a 2 das hbiles para completar este proceso.  Los precios de los medicamentos varan con frecuencia dependiendo del Environmental consultant de dnde se surte la receta y alguna farmacias pueden ofrecer precios ms baratos.  El sitio web www.goodrx.com tiene cupones para medicamentos de Airline pilot. Los precios aqu no tienen en cuenta lo que podra costar con la ayuda del seguro (puede ser ms barato con su seguro), pero el sitio web puede darle el precio si no utiliz Research scientist (physical sciences).  - Puede imprimir el cupn correspondiente y llevarlo con su receta a la farmacia.  - Tambin puede pasar por nuestra oficina durante el horario de atencin regular y Charity fundraiser una tarjeta de cupones de GoodRx.  - Si necesita que su receta se enve electrnicamente a una farmacia diferente, informe a nuestra oficina a travs de MyChart de [place] o por telfono llamando al [phone] y presione la opcin 4.

## 2022-08-31 NOTE — Progress Notes (Signed)
Follow-Up Visit   Subjective  Alexander Howe is a 76 y.o. male who presents for the following: FBSE (Hx of MM, SCC, BCC, AK. ).  The patient presents for Total-Body Skin Exam (TBSE) for skin cancer screening and mole check.  The patient has spots, moles and lesions to be evaluated, some may be new or changing and the patient has concerns that these could be cancer.   The following portions of the chart were reviewed this encounter and updated as appropriate:   Tobacco  Allergies  Meds  Problems  Med Hx  Surg Hx  Fam Hx      Review of Systems:  No other skin or systemic complaints except as noted in HPI or Assessment and Plan.  Objective  Well appearing patient in no apparent distress; mood and affect are within normal limits.  A full examination was performed including scalp, head, eyes, ears, nose, lips, neck, chest, axillae, abdomen, back, buttocks, bilateral upper extremities, bilateral lower extremities, hands, feet, fingers, toes, fingernails, and toenails. All findings within normal limits unless otherwise noted below.  posterior base of neck 1.1 cm pink and brown thin papule  R/o MMis or BCC     Right Ear 0.3 cm irregular thin brown papule R/o Atypia vs SK     Left Upper Back x 1, hypertrophic at left lateral pretibia x 1, left forearm x 1 (3) Erythematous thin papules/macules with gritty scale.     Assessment & Plan  Neoplasm of skin (2) posterior base of neck  Epidermal / dermal shaving  Lesion diameter (cm):  1.1 Informed consent: discussed and consent obtained   Timeout: patient name, date of birth, surgical site, and procedure verified   Anesthesia: the lesion was anesthetized in a standard fashion   Anesthetic:  1% lidocaine w/ epinephrine 1-100,000 local infiltration Instrument used: #15 blade   Hemostasis achieved with: aluminum chloride   Outcome: patient tolerated procedure well   Post-procedure details: wound care instructions given    Additional details:  Mupirocin and a bandage applied  Specimen 1 - Surgical pathology Differential Diagnosis: R/o MMis or BCC  Check Margins: No 1.1 cm pink and brown thin papule    Right Ear  Epidermal / dermal shaving  Lesion diameter (cm):  0.3 Informed consent: discussed and consent obtained   Timeout: patient name, date of birth, surgical site, and procedure verified   Anesthesia: the lesion was anesthetized in a standard fashion   Anesthetic:  1% lidocaine w/ epinephrine 1-100,000 local infiltration Instrument used: flexible razor blade   Hemostasis achieved with: aluminum chloride   Outcome: patient tolerated procedure well   Post-procedure details: wound care instructions given   Additional details:  Mupirocin and a bandage applied  Specimen 2 - Surgical pathology Differential Diagnosis: R/o Atypia vs SK  Check Margins: No 0.3 cm irregular thin brown papule   AK (actinic keratosis) (3) Left Upper Back x 1, hypertrophic at left lateral pretibia x 1, left forearm x 1  Actinic keratoses are precancerous spots that appear secondary to cumulative UV radiation exposure/sun exposure over time. They are chronic with expected duration over 1 year. A portion of actinic keratoses will progress to squamous cell carcinoma of the skin. It is not possible to reliably predict which spots will progress to skin cancer and so treatment is recommended to prevent development of skin cancer.  Recommend daily broad spectrum sunscreen SPF 30+ to sun-exposed areas, reapply every 2 hours as needed.  Recommend staying in the  shade or wearing long sleeves, sun glasses (UVA+UVB protection) and wide brim hats (4-inch brim around the entire circumference of the hat). Call for new or changing lesions.  Prior to procedure, discussed risks of blister formation, small wound, skin dyspigmentation, or rare scar following cryotherapy. Recommend Vaseline ointment to treated areas while  healing.   Destruction of lesion - Left Upper Back x 1, hypertrophic at left lateral pretibia x 1, left forearm x 1  Destruction method: cryotherapy   Informed consent: discussed and consent obtained   Lesion destroyed using liquid nitrogen: Yes   Cryotherapy cycles:  2 Outcome: patient tolerated procedure well with no complications   Post-procedure details: wound care instructions given     History of Basal Cell Carcinoma of the Skin - No evidence of recurrence today - Recommend regular full body skin exams - Recommend daily broad spectrum sunscreen SPF 30+ to sun-exposed areas, reapply every 2 hours as needed.  - Call if any new or changing lesions are noted between office visits  History of Squamous Cell Carcinoma of the Skin - No evidence of recurrence today - No lymphadenopathy - Recommend regular full body skin exams - Recommend daily broad spectrum sunscreen SPF 30+ to sun-exposed areas, reapply every 2 hours as needed.  - Call if any new or changing lesions are noted between office visits  History of Melanoma in Situ - No evidence of recurrence today - Recommend regular full body skin exams - Recommend daily broad spectrum sunscreen SPF 30+ to sun-exposed areas, reapply every 2 hours as needed.  - Call if any new or changing lesions are noted between office visits  History of PreCancerous Actinic Keratosis  - site(s) of PreCancerous Actinic Keratosis clear today. - these may recur and new lesions may form requiring treatment to prevent transformation into skin cancer - observe for new or changing spots and contact [place] for appointment if occur - photoprotection with sun protective clothing; sunglasses and broad spectrum sunscreen with SPF of at least 30 + and frequent self skin exams recommended - yearly exams by a dermatologist recommended for persons with history of PreCancerous Actinic Keratoses  Lentigines - Scattered tan macules - Due to sun  exposure - Benign-appearing, observe - Recommend daily broad spectrum sunscreen SPF 30+ to sun-exposed areas, reapply every 2 hours as needed. - Call for any changes  Seborrheic Keratoses - Stuck-on, waxy, tan-brown papules and/or plaques  - Benign-appearing - Discussed benign etiology and prognosis. - Observe - Call for any changes  Melanocytic Nevi - Tan-brown and/or pink-flesh-colored symmetric macules and papules - Benign appearing on exam today - Observation - Call clinic for new or changing moles - Recommend daily use of broad spectrum spf 30+ sunscreen to sun-exposed areas.   Hemangiomas - Red papules - Discussed benign nature - Observe - Call for any changes  Actinic Damage - Severe, confluent actinic changes with pre-cancerous actinic keratoses  - Severe, chronic, not at goal, secondary to cumulative UV radiation exposure over time - diffuse scaly erythematous macules and papules with underlying dyspigmentation - Discussed Prescription "Field Treatment" for Severe, Chronic Confluent Actinic Changes with Pre-Cancerous Actinic Keratoses Field treatment involves treatment of an entire area of skin that has confluent Actinic Changes (Sun/ Ultraviolet light damage) and PreCancerous Actinic Keratoses by method of PhotoDynamic Therapy (PDT) and/or prescription Topical Chemotherapy agents such as 5-fluorouracil, 5-fluorouracil/calcipotriene, and/or imiquimod.  The purpose is to decrease the number of clinically evident and subclinical PreCancerous lesions to prevent progression to development  of skin cancer by chemically destroying early precancer changes that may or may not be visible.  It has been shown to reduce the risk of developing skin cancer in the treated area. As a result of treatment, redness, scaling, crusting, and open sores may occur during treatment course. One or more than one of these methods may be used and may have to be used several times to control, suppress and  eliminate the PreCancerous changes. Discussed treatment course, expected reaction, and possible side effects. - Recommend daily broad spectrum sunscreen SPF 30+ to sun-exposed areas, reapply every 2 hours as needed.  - Staying in the shade or wearing long sleeves, sun glasses (UVA+UVB protection) and wide brim hats (4-inch brim around the entire circumference of the hat) are also recommended. - Call for new or changing lesions. - Start 5-fluorouracil cream twice a day for 4 days to affected areas including face, 7 days to scalp.  Reviewed course of treatment and expected reaction.  Patient advised to expect inflammation and crusting and advised that erosions are possible.  Patient advised to be diligent with sun protection during and after treatment. Handout with details of how to apply medication and what to expect provided. Counseled to keep medication out of reach of children and pets.  Skin cancer screening performed today.   Return for AK follow up 3-4 months, 6 month FBSE.  Graciella Belton, RMA, am acting as scribe for Forest Gleason, MD .  Documentation: I have reviewed the above documentation for accuracy and completeness, and I agree with the above.  Forest Gleason, MD

## 2022-09-06 ENCOUNTER — Telehealth: Payer: Self-pay

## 2022-09-06 NOTE — Telephone Encounter (Signed)
Spoke with patient and spouse and advised him of BX results. Scheduled for EDC. aw

## 2022-09-06 NOTE — Telephone Encounter (Addendum)
Tried calling patient. No answer and unable to leave voicemail. Will try again at later time.     ----- Message from Alfonso Patten, MD sent at 09/05/2022 11:10 PM EDT ----- 1. Skin , posterior base of neck SUPERFICIAL BASAL CELL CARCINOMA, PIGMENTED --> ED&C  2. Skin , right ear SEBORRHEIC KERATOSIS, IRRITATED  This is a benign growth or "wisdom spot". No additional treatment is needed.   MAs please call with results and schedule. Thank you!

## 2022-09-07 ENCOUNTER — Encounter: Payer: Self-pay | Admitting: Dermatology

## 2022-09-11 ENCOUNTER — Encounter (INDEPENDENT_AMBULATORY_CARE_PROVIDER_SITE_OTHER): Payer: Self-pay

## 2022-09-28 ENCOUNTER — Encounter: Payer: Self-pay | Admitting: Dermatology

## 2022-09-28 ENCOUNTER — Ambulatory Visit: Payer: Medicare Other | Admitting: Dermatology

## 2022-09-28 DIAGNOSIS — C4441 Basal cell carcinoma of skin of scalp and neck: Secondary | ICD-10-CM

## 2022-09-28 DIAGNOSIS — L570 Actinic keratosis: Secondary | ICD-10-CM

## 2022-09-28 DIAGNOSIS — L578 Other skin changes due to chronic exposure to nonionizing radiation: Secondary | ICD-10-CM | POA: Diagnosis not present

## 2022-09-28 DIAGNOSIS — L821 Other seborrheic keratosis: Secondary | ICD-10-CM

## 2022-09-28 DIAGNOSIS — L820 Inflamed seborrheic keratosis: Secondary | ICD-10-CM | POA: Diagnosis not present

## 2022-09-28 MED ORDER — TRIAMCINOLONE ACETONIDE 0.1 % EX OINT: TOPICAL_OINTMENT | CUTANEOUS | 1 refills | Status: DC

## 2022-09-28 NOTE — Progress Notes (Signed)
Follow-Up Visit   Subjective  Alexander Howe is a 76 y.o. male who presents for the following: Skin Cancer (Here for EDC of BCC at posterior base of neck).  The following portions of the chart were reviewed this encounter and updated as appropriate:  Tobacco  Allergies  Meds  Problems  Med Hx  Surg Hx  Fam Hx      Review of Systems: No other skin or systemic complaints except as noted in HPI or Assessment and Plan.   Objective  Well appearing patient in no apparent distress; mood and affect are within normal limits.  A focused examination was performed including posterior neck. Relevant physical exam findings are noted in the Assessment and Plan.  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 of neck  Destruction of lesion  Destruction method: electrodesiccation and curettage   Informed consent: discussed and consent obtained   Timeout:  patient name, date of birth, surgical site, and procedure verified Anesthesia: the lesion was anesthetized in a standard fashion   Anesthetic:  1% lidocaine w/ epinephrine 1-100,000 buffered w/ 8.4% NaHCO3 Curettage performed in three different directions: Yes   Electrodesiccation performed over the curetted area: Yes   Curettage cycles:  3 Final wound size (cm):  1.6 Hemostasis achieved with:  electrodesiccation Outcome: patient tolerated procedure well with no complications   Post-procedure details: sterile dressing applied and wound care instructions given   Dressing type: petrolatum    Inflamed seborrheic keratosis  Related Medications triamcinolone ointment (KENALOG) 0.1 % Apply twice daily to scalp for 1 week if needed after treatment   Actinic Damage with PreCancerous Actinic Keratoses Counseling for Topical Chemotherapy Management: Patient exhibits: - Severe, confluent actinic changes with pre-cancerous actinic keratoses that is  secondary to cumulative UV radiation exposure over time - Condition that is severe; chronic, not at goal. - diffuse scaly erythematous macules and papules with underlying dyspigmentation - Discussed Prescription "Field Treatment" topical Chemotherapy for Severe, Chronic Confluent Actinic Changes with Pre-Cancerous Actinic Keratoses Field treatment involves treatment of an entire area of skin that has confluent Actinic Changes (Sun/ Ultraviolet light damage) and PreCancerous Actinic Keratoses by method of PhotoDynamic Therapy (PDT) and/or prescription Topical Chemotherapy agents such as 5-fluorouracil, 5-fluorouracil/calcipotriene, and/or imiquimod.  The purpose is to decrease the number of clinically evident and subclinical PreCancerous lesions to prevent progression to development of skin cancer by chemically destroying early precancer changes that may or may not be visible.  It has been shown to reduce the risk of developing skin cancer in the treated area. As a result of treatment, redness, scaling, crusting, and open sores may occur during treatment course. One or more than one of these methods may be used and may have to be used several times to control, suppress and eliminate the PreCancerous changes. Discussed treatment course, expected reaction, and possible side effects. - Recommend daily broad spectrum sunscreen SPF 30+ to sun-exposed areas, reapply every 2 hours as needed.  - Staying in the shade or wearing long sleeves, sun glasses (UVA+UVB protection) and wide brim hats (4-inch brim around the entire circumference of the hat) are also recommended. - Call for new or changing lesions.  - Start 5-fluorouracil cream twice a day for 4 days to affected areas including face and neck, 7 days to scalp.  Reviewed course of treatment and expected reaction.  Patient advised to expect inflammation and crusting and advised  that erosions are possible.  Patient advised to be diligent with sun protection during  and after treatment. Handout with details of how to apply medication and what to expect provided. Counseled to keep medication out of reach of children and pets.   Seborrheic Keratoses - Stuck-on, waxy, tan-brown papules and/or plaques  - Benign-appearing - Discussed benign etiology and prognosis. - Observe - Call for any changes    Return for Follow Up As Scheduled.  I, Emelia Salisbury, CMA, am acting as scribe for Forest Gleason, MD.  Documentation: I have reviewed the above documentation for accuracy and completeness, and I agree with the above.  Forest Gleason, MD

## 2022-09-28 NOTE — Patient Instructions (Addendum)
Electrodesiccation and Curettage ("Scrape and Burn") Wound Care Instructions  Leave the original bandage on for 24 hours if possible.  If the bandage becomes soaked or soiled before that time, it is OK to remove it and examine the wound.  A small amount of post-operative bleeding is normal.  If excessive bleeding occurs, remove the bandage, place gauze over the site and apply continuous pressure (no peeking) over the area for 30 minutes. If this does not work, please call our clinic as soon as possible or page your doctor if it is after hours.   Once a day, cleanse the wound with soap and water. It is fine to shower. If a thick crust develops you may use a Q-tip dipped into dilute hydrogen peroxide (mix 1:1 with water) to dissolve it.  Hydrogen peroxide can slow the healing process, so use it only as needed.    After washing, apply petroleum jelly (Vaseline) or an antibiotic ointment if your doctor prescribed one for you, followed by a bandage.    For best healing, the wound should be covered with a layer of ointment at all times. If you are not able to keep the area covered with a bandage to hold the ointment in place, this may mean re-applying the ointment several times a day.  Continue this wound care until the wound has healed and is no longer open. It may take several weeks for the wound to heal and close.  Itching and mild discomfort is normal during the healing process.  If you have any discomfort, you can take Tylenol (acetaminophen) or ibuprofen as directed on the bottle. (Please do not take these if you have an allergy to them or cannot take them for another reason).  Some redness, tenderness and white or yellow material in the wound is normal healing.  If the area becomes very sore and red, or develops a thick yellow-green material (pus), it may be infected; please notify us.    Wound healing continues for up to one year following surgery. It is not unusual to experience pain in the scar  from time to time during the interval.  If the pain becomes severe or the scar thickens, you should notify the office.    A slight amount of redness in a scar is expected for the first six months.  After six months, the redness will fade and the scar will soften and fade.  The color difference becomes less noticeable with time.  If there are any problems, return for a post-op surgery check at your earliest convenience.  To improve the appearance of the scar, you can use silicone scar gel, cream, or sheets (such as Mederma or Serica) every night for up to one year. These are available over the counter (without a prescription).  Please call our office at [phone] for any questions or concerns.   Start 5-fluorouracil cream twice a day for 4 days to affected areas including face and neck, 7 days to scalp.   Use Triamcinolone ointment twice daily for 1 week after if needed after treatment.  Avoid applying to face, groin, and axilla. Use as directed. Long-term use can cause thinning of the skin.   Instructions for [facility] Medications  One or more of your medications was sent to the [facility] mail order compounding pharmacy. You will receive an email from them and can purchase the medicine through that link. It will then be mailed to your home at the address you confirmed. If for any  reason you do not receive an email from them, please check your spam folder. If you still do not find the email, please let us know. [facility] phone number is [phone].   5-Fluorouracil/Calcipotriene Patient Education   Actinic keratoses are the dry, red scaly spots on the skin caused by sun damage. A portion of these spots can turn into skin cancer with time, and treating them can help prevent development of skin cancer.   Treatment of these spots requires removal of the defective skin cells. There are various ways to remove actinic keratoses, including freezing with liquid nitrogen, treatment  with creams, or treatment with a blue light procedure in the office.   5-fluorouracil cream is a topical cream used to treat actinic keratoses. It works by interfering with the growth of abnormal fast-growing skin cells, such as actinic keratoses. These cells peel off and are replaced by healthy ones.   5-fluorouracil/calcipotriene is a combination of the 5-fluorouracil cream with a vitamin D analog cream called calcipotriene. The calcipotriene alone does not treat actinic keratoses. However, when it is combined with 5-fluorouracil, it helps the 5-fluorouracil treat the actinic keratoses much faster so that the same results can be achieved with a much shorter treatment time.  INSTRUCTIONS FOR 5-FLUOROURACIL/CALCIPOTRIENE CREAM:   5-fluorouracil/calcipotriene cream typically only needs to be used for 4-7 days. A thin layer should be applied twice a day to the treatment areas recommended by your physician.   If your physician prescribed you separate tubes of 5-fluourouracil and calcipotriene, apply a thin layer of 5-fluorouracil followed by a thin layer of calcipotriene.   Avoid contact with your eyes, nostrils, and mouth. Do not use 5-fluorouracil/calcipotriene cream on infected or open wounds.   You will develop redness, irritation and some crusting at areas where you have pre-cancer damage/actinic keratoses. IF YOU DEVELOP PAIN, BLEEDING, OR SIGNIFICANT CRUSTING, STOP THE TREATMENT EARLY - you have already gotten a good response and the actinic keratoses should clear up well.  Wash your hands after applying 5-fluorouracil 5% cream on your skin.   A moisturizer or sunscreen with a minimum SPF 30 should be applied each morning.   Once you have finished the treatment, you can apply a thin layer of Vaseline twice a day to irritated areas to soothe and calm the areas more quickly. If you experience significant discomfort, contact your physician.  For some patients it is necessary to repeat the  treatment for best results.  SIDE EFFECTS: When using 5-fluorouracil/calcipotriene cream, you may have mild irritation, such as redness, dryness, swelling, or a mild burning sensation. This usually resolves within 2 weeks. The more actinic keratoses you have, the more redness and inflammation you can expect during treatment. Eye irritation has been reported rarely. If this occurs, please let us know.  If you have any trouble using this cream, please call the office. If you have any other questions about this information, please do not hesitate to ask me before you leave the office.   Due to recent changes in healthcare laws, you may see results of your pathology and/or laboratory studies on MyChart before the doctors have had a chance to review them. We understand that in some cases there may be results that are confusing or concerning to you. Please understand that not all results are received at the same time and often the doctors may need to interpret multiple results in order to provide you with the best plan of care or course of treatment. Therefore, we ask that  you please give [place] 2 business days to thoroughly review all your results before contacting the office for clarification. Should we see a critical lab result, you will be contacted sooner.   If You Need Anything After Your Visit  If you have any questions or concerns for your doctor, please call our main line at [phone] and press option 4 to reach your doctor's medical assistant. If no one answers, please leave a voicemail as directed and we will return your call as soon as possible. Messages left after 4 pm will be answered the following business day.   You may also send [place] a message via [place]. We typically respond to MyChart messages within 1-2 business days.  For prescription refills, please ask your pharmacy to contact our office. Our fax number is [phone].  If you have an urgent issue when the clinic is closed that  cannot wait until the next business day, you can page your doctor at the number below.    Please note that while we do our best to be available for urgent issues outside of office hours, we are not available 24/7.   If you have an urgent issue and are unable to reach [place], you may choose to seek medical care at your doctor's office, retail clinic, urgent care center, or emergency room.  If you have a medical emergency, please immediately call 911 or go to the emergency department.  Pager Numbers  - Dr. Nehemiah Massed: [phone]  - Dr. Laurence Ferrari: [phone]  - Dr. Nicole Kindred: [phone]  In the event of inclement weather, please call our main line at [phone] for an update on the status of any delays or closures.  Dermatology Medication Tips: Please keep the boxes that topical medications come in in order to help keep track of the instructions about where and how to use these. Pharmacies typically print the medication instructions only on the boxes and not directly on the medication tubes.   If your medication is too expensive, please contact our office at [phone] option 4 or send [place] a message through [place].   We are unable to tell what your co-pay for medications will be in advance as this is different depending on your insurance coverage. However, we may be able to find a substitute medication at lower cost or fill out paperwork to get insurance to cover a needed medication.   If a prior authorization is required to get your medication covered by your insurance company, please allow [place] 1-2 business days to complete this process.  Drug prices often vary depending on where the prescription is filled and some pharmacies may offer cheaper prices.  The website www.goodrx.com contains coupons for medications through different pharmacies. The prices here do not account for what the cost may be with help from insurance (it may be cheaper with your insurance), but the website can give you the  price if you did not use any insurance.  - You can print the associated coupon and take it with your prescription to the pharmacy.  - You may also stop by our office during regular business hours and pick up a GoodRx coupon card.  - If you need your prescription sent electronically to a different pharmacy, notify our office through [facility] or by phone at [phone] option 4.     Si Usted Necesita Algo Despus de Su Visita  Tambin puede enviarnos un mensaje a travs de Pharmacist, community. Por lo general respondemos a los mensajes de Therapist, occupational transcurso  de 1 a 2 das hbiles.  Para renovar recetas, por favor pida a su farmacia que se ponga en contacto con nuestra oficina. Harland Dingwall de fax es [place] [phone].  Si tiene un asunto urgente cuando la clnica est cerrada y que no puede esperar hasta el siguiente da hbil, puede llamar/localizar a su doctor(a) al nmero que aparece a continuacin.   Por favor, tenga en cuenta que aunque hacemos todo lo posible para estar disponibles para asuntos urgentes fuera del horario de [place], no estamos disponibles las 24 horas del da, los 7 das de la [place].   Si tiene un problema urgente y no puede comunicarse con nosotros, puede optar por buscar atencin mdica  en el consultorio de su doctor(a), en una clnica privada, en un centro de atencin urgente o en una sala de emergencias.  Si tiene Engineering geologist, por favor llame inmediatamente al 911 o vaya a la sala de emergencias.  Nmeros de bper  - Dr. Nehemiah Massed: [phone]  - Dra. Moye: [phone]  - Dra. Nicole Kindred: [phone]  En caso de inclemencias del [place], por favor llame a Johnsie Kindred principal al [phone] para una actualizacin sobre el [place] de cualquier retraso o cierre.  Consejos para la medicacin en dermatologa: Por favor, guarde las cajas en las que vienen los medicamentos de uso tpico para ayudarle a seguir las instrucciones sobre dnde y cmo  usarlos. Las farmacias generalmente imprimen las instrucciones del medicamento slo en las cajas y no directamente en los tubos del [place].   Si su medicamento es muy caro, por favor, pngase en contacto con Zigmund Daniel llamando al [phone] y presione la opcin 4 o envenos un mensaje a travs de Pharmacist, community.   No podemos decirle cul ser su copago por los medicamentos por adelantado ya que esto es diferente dependiendo de la cobertura de su seguro. Sin embargo, es posible que podamos encontrar un medicamento sustituto a Electrical engineer un formulario para que el seguro cubra el medicamento que se considera necesario.   Si se requiere una autorizacin previa para que su compaa de seguros [place] su medicamento, por favor permtanos de 1 a 2 das hbiles para completar este proceso.  Los precios de los medicamentos varan con frecuencia dependiendo del Environmental consultant de dnde se surte la receta y alguna farmacias pueden ofrecer precios ms baratos.  El sitio web www.goodrx.com tiene cupones para medicamentos de Airline pilot. Los precios aqu no tienen en cuenta lo que podra costar con la ayuda del seguro (puede ser ms barato con su seguro), pero el sitio web puede darle el precio si no utiliz Research scientist (physical sciences).  - Puede imprimir el cupn correspondiente y llevarlo con su receta a la farmacia.  - Tambin puede pasar por nuestra oficina durante el horario de atencin regular y Charity fundraiser una tarjeta de cupones de GoodRx.  - Si necesita que su receta se enve electrnicamente a una farmacia diferente, informe a nuestra oficina a travs de MyChart de  o por telfono llamando al [phone] y presione la opcin 4.

## 2022-10-08 ENCOUNTER — Encounter: Payer: Self-pay | Admitting: Dermatology

## 2022-12-14 ENCOUNTER — Ambulatory Visit: Payer: Medicare HMO | Admitting: Dermatology

## 2022-12-14 VITALS — BP 131/75 | HR 49

## 2022-12-14 DIAGNOSIS — L821 Other seborrheic keratosis: Secondary | ICD-10-CM

## 2022-12-14 DIAGNOSIS — Z85828 Personal history of other malignant neoplasm of skin: Secondary | ICD-10-CM | POA: Diagnosis not present

## 2022-12-14 DIAGNOSIS — L244 Irritant contact dermatitis due to drugs in contact with skin: Secondary | ICD-10-CM

## 2022-12-14 DIAGNOSIS — L720 Epidermal cyst: Secondary | ICD-10-CM

## 2022-12-14 DIAGNOSIS — L814 Other melanin hyperpigmentation: Secondary | ICD-10-CM | POA: Diagnosis not present

## 2022-12-14 DIAGNOSIS — R21 Rash and other nonspecific skin eruption: Secondary | ICD-10-CM

## 2022-12-14 DIAGNOSIS — L578 Other skin changes due to chronic exposure to nonionizing radiation: Secondary | ICD-10-CM | POA: Diagnosis not present

## 2022-12-14 NOTE — Patient Instructions (Addendum)
May use Triamcinolone 0.1% ointment to the scalp twice daily as needed for two weeks on the scalp. May use twice daily on the face for four days after finishing treatment with 5FU.   Due to recent changes in healthcare laws, you may see results of your pathology and/or laboratory studies on MyChart before the doctors have had a chance to review them. We understand that in some cases there may be results that are confusing or concerning to you. Please understand that not all results are received at the same time and often the doctors may need to interpret multiple results in order to provide you with the best plan of care or course of treatment. Therefore, we ask that you please give [place] 2 business days to thoroughly review all your results before contacting the office for clarification. Should we see a critical lab result, you will be contacted sooner.   If You Need Anything After Your Visit  If you have any questions or concerns for your doctor, please call our main line at [phone] and press option 4 to reach your doctor's medical assistant. If no one answers, please leave a voicemail as directed and we will return your call as soon as possible. Messages left after 4 pm will be answered the following business day.   You may also send [place] a message via [place]. We typically respond to MyChart messages within 1-2 business days.  For prescription refills, please ask your pharmacy to contact our office. Our fax number is [phone].  If you have an urgent issue when the clinic is closed that cannot wait until the next business day, you can page your doctor at the number below.    Please note that while we do our best to be available for urgent issues outside of office hours, we are not available 24/7.   If you have an urgent issue and are unable to reach [place], you may choose to seek medical care at your doctor's office, retail clinic, urgent care center, or emergency room.  If you have a medical  emergency, please immediately call 911 or go to the emergency department.  Pager Numbers  - Dr. Nehemiah Massed: [phone]  - Dr. Laurence Ferrari: [phone]  - Dr. Nicole Kindred: [phone]  In the event of inclement weather, please call our main line at [phone] for an update on the status of any delays or closures.  Dermatology Medication Tips: Please keep the boxes that topical medications come in in order to help keep track of the instructions about where and how to use these. Pharmacies typically print the medication instructions only on the boxes and not directly on the medication tubes.   If your medication is too expensive, please contact our office at [phone] option 4 or send [place] a message through [place].   We are unable to tell what your co-pay for medications will be in advance as this is different depending on your insurance coverage. However, we may be able to find a substitute medication at lower cost or fill out paperwork to get insurance to cover a needed medication.   If a prior authorization is required to get your medication covered by your insurance company, please allow [place] 1-2 business days to complete this process.  Drug prices often vary depending on where the prescription is filled and some pharmacies may offer cheaper prices.  The website www.goodrx.com contains coupons for medications through different pharmacies. The prices here do not account for what the cost may be with help from  insurance (it may be cheaper with your insurance), but the website can give you the price if you did not use any insurance.  - You can print the associated coupon and take it with your prescription to the pharmacy.  - You may also stop by our office during regular business hours and pick up a GoodRx coupon card.  - If you need your prescription sent electronically to a different pharmacy, notify our office through [facility] or by phone at [phone] option 4.     Si Usted  Necesita Algo Despus de Su Visita  Tambin puede enviarnos un mensaje a travs de Pharmacist, community. Por lo general respondemos a los mensajes de MyChart en el transcurso de 1 a 2 das hbiles.  Para renovar recetas, por favor pida a su farmacia que se ponga en contacto con nuestra oficina. Harland Dingwall de fax es [place] [phone].  Si tiene un asunto urgente cuando la clnica est cerrada y que no puede esperar hasta el siguiente da hbil, puede llamar/localizar a su doctor(a) al nmero que aparece a continuacin.   Por favor, tenga en cuenta que aunque hacemos todo lo posible para estar disponibles para asuntos urgentes fuera del horario de [place], no estamos disponibles las 24 horas del da, los 7 das de la [place].   Si tiene un problema urgente y no puede comunicarse con nosotros, puede optar por buscar atencin mdica  en el consultorio de su doctor(a), en una clnica privada, en un centro de atencin urgente o en una sala de emergencias.  Si tiene Engineering geologist, por favor llame inmediatamente al 911 o vaya a la sala de emergencias.  Nmeros de bper  - Dr. Nehemiah Massed: [phone]  - Dra. Moye: [phone]  - Dra. Nicole Kindred: [phone]  En caso de inclemencias del [place], por favor llame a Johnsie Kindred principal al [phone] para una actualizacin sobre el [place] de cualquier retraso o cierre.  Consejos para la medicacin en dermatologa: Por favor, guarde las cajas en las que vienen los medicamentos de uso tpico para ayudarle a seguir las instrucciones sobre dnde y cmo usarlos. Las farmacias generalmente imprimen las instrucciones del medicamento slo en las cajas y no directamente en los tubos del [place].   Si su medicamento es muy caro, por favor, pngase en contacto con Zigmund Daniel llamando al [phone] y presione la opcin 4 o envenos un mensaje a travs de Pharmacist, community.   No podemos decirle cul ser su copago por los medicamentos por adelantado ya que esto es  diferente dependiendo de la cobertura de su seguro. Sin embargo, es posible que podamos encontrar un medicamento sustituto a Electrical engineer un formulario para que el seguro cubra el medicamento que se considera necesario.   Si se requiere una autorizacin previa para que su compaa de seguros [place] su medicamento, por favor permtanos de 1 a 2 das hbiles para completar este proceso.  Los precios de los medicamentos varan con frecuencia dependiendo del Environmental consultant de dnde se surte la receta y alguna farmacias pueden ofrecer precios ms baratos.  El sitio web www.goodrx.com tiene cupones para medicamentos de Airline pilot. Los precios aqu no tienen en cuenta lo que podra costar con la ayuda del seguro (puede ser ms barato con su seguro), pero el sitio web puede darle el precio si no utiliz Research scientist (physical sciences).  - Puede imprimir el cupn correspondiente y llevarlo con su receta a la farmacia.  - Tambin puede pasar por nuestra oficina durante el horario de  atencin regular y Charity fundraiser una tarjeta de cupones de GoodRx.  - Si necesita que su receta se enve electrnicamente a una farmacia diferente, informe a nuestra oficina a travs de MyChart de [place] o por telfono llamando al [phone] y presione la opcin 4.

## 2022-12-14 NOTE — Progress Notes (Signed)
Follow-Up Visit   Subjective  Alexander Howe is a 77 y.o. male who presents for the following: Actinic Keratosis (L upper back x 1, L lat pretibial x 1, L forearm x 1 - pt on day 8 of 5FU cream to the forehead and is experiencing erythema at sites). Pt is also here today for [facility] site recheck for [facility] of the post neck.    The following portions of the chart were reviewed this encounter and updated as appropriate:   Tobacco  Allergies  Meds  Problems  Med Hx  Surg Hx  Fam Hx      Review of Systems:  No other skin or systemic complaints except as noted in HPI or Assessment and Plan.  Objective  Well appearing patient in no apparent distress; mood and affect are within normal limits.  A focused examination was performed including the face, scalp, neck, and back. Relevant physical exam findings are noted in the Assessment and Plan.  Face Diffuse scaly erythematous macules with underlying dyspigmentation.   Scalp/forehead Scaly erythematous papules coalescing to plaques    Assessment & Plan  Actinic skin damage Face  Actinic Damage with PreCancerous Actinic Keratoses Counseling for Topical Chemotherapy Management: Patient exhibits: - Severe, confluent actinic changes with pre-cancerous actinic keratoses that is secondary to cumulative UV radiation exposure over time - Condition that is severe; chronic, not at goal. - diffuse scaly erythematous macules and papules with underlying dyspigmentation - Discussed Prescription "Field Treatment" topical Chemotherapy for Severe, Chronic Confluent Actinic Changes with Pre-Cancerous Actinic Keratoses Field treatment involves treatment of an entire area of skin that has confluent Actinic Changes (Sun/ Ultraviolet light damage) and PreCancerous Actinic Keratoses by method of PhotoDynamic Therapy (PDT) and/or prescription Topical Chemotherapy agents such as 5-fluorouracil, 5-fluorouracil/calcipotriene, and/or imiquimod.  The purpose is to  decrease the number of clinically evident and subclinical PreCancerous lesions to prevent progression to development of skin cancer by chemically destroying early precancer changes that may or may not be visible.  It has been shown to reduce the risk of developing skin cancer in the treated area. As a result of treatment, redness, scaling, crusting, and open sores may occur during treatment course. One or more than one of these methods may be used and may have to be used several times to control, suppress and eliminate the PreCancerous changes. Discussed treatment course, expected reaction, and possible side effects. - Recommend daily broad spectrum sunscreen SPF 30+ to sun-exposed areas, reapply every 2 hours as needed.  - Staying in the shade or wearing long sleeves, sun glasses (UVA+UVB protection) and wide brim hats (4-inch brim around the entire circumference of the hat) are also recommended. - Call for new or changing lesions. Once the scalp has healed may start 5FU to the face BID x 4 days (pt has at home) Reviewed course of treatment and expected reaction.  Patient advised to expect inflammation and crusting and advised that erosions are possible.  Patient advised to be diligent with sun protection during and after treatment. Handout with details of how to apply medication and what to expect provided. Counseled to keep medication out of reach of children and pets.   Rash and other nonspecific skin eruption Scalp/forehead  C/w Expected reaction to 5FU. D/C 5FU mix and start TMC 0.1% ointment apply twice daily to scalp for up to 2 weeks on the scalp and up to 4 days on the face if needed after treatment (patient has at home already).   Topical steroids (  such as triamcinolone, fluocinolone, fluocinonide, mometasone, clobetasol, halobetasol, betamethasone, hydrocortisone) can cause thinning and lightening of the skin if they are used for too long in the same area. Your physician has selected the  right strength medicine for your problem and area affected on the body. Please use your medication only as directed by your physician to prevent side effects.     Lentigines - Scattered tan macules - Due to sun exposure - Benign-appearing, observe - Recommend daily broad spectrum sunscreen SPF 30+ to sun-exposed areas, reapply every 2 hours as needed. - Call for any changes  Seborrheic Keratoses - Stuck-on, waxy, tan-brown papules and/or plaques  - Benign-appearing - Discussed benign etiology and prognosis. - Observe - Call for any changes  History of Basal Cell Carcinoma of the Skin - No evidence of recurrence today - Recommend regular full body skin exams - Recommend daily broad spectrum sunscreen SPF 30+ to sun-exposed areas, reapply every 2 hours as needed.  - Call if any new or changing lesions are noted between office visits  Milia - eyelid - tiny firm white papules - type of cyst - benign - may be extracted if symptomatic - observe  Return for [place] follow up in 6-8 weeks.  Luther Redo, CMA, am acting as scribe for Forest Gleason, MD .  Documentation: I have reviewed the above documentation for accuracy and completeness, and I agree with the above.  Forest Gleason, MD

## 2022-12-26 ENCOUNTER — Encounter: Payer: Self-pay | Admitting: Dermatology

## 2023-02-01 ENCOUNTER — Encounter: Payer: Self-pay | Admitting: Dermatology

## 2023-02-01 ENCOUNTER — Ambulatory Visit: Payer: Medicare HMO | Admitting: Dermatology

## 2023-02-01 VITALS — BP 117/70 | HR 45

## 2023-02-01 DIAGNOSIS — L570 Actinic keratosis: Secondary | ICD-10-CM | POA: Diagnosis not present

## 2023-02-01 DIAGNOSIS — L739 Follicular disorder, unspecified: Secondary | ICD-10-CM

## 2023-02-01 DIAGNOSIS — L578 Other skin changes due to chronic exposure to nonionizing radiation: Secondary | ICD-10-CM | POA: Diagnosis not present

## 2023-02-01 DIAGNOSIS — D489 Neoplasm of uncertain behavior, unspecified: Secondary | ICD-10-CM

## 2023-02-01 NOTE — Patient Instructions (Addendum)
Cryotherapy Aftercare  Wash gently with soap and water everyday.   Apply Vaseline and Band-Aid daily until healed.    Actinic keratoses are precancerous spots that appear secondary to cumulative UV radiation exposure/sun exposure over time. They are chronic with expected duration over 1 year. A portion of actinic keratoses will progress to squamous cell carcinoma of the skin. It is not possible to reliably predict which spots will progress to skin cancer and so treatment is recommended to prevent development of skin cancer.  Recommend daily broad spectrum sunscreen SPF 30+ to sun-exposed areas, reapply every 2 hours as needed.  Recommend staying in the shade or wearing long sleeves, sun glasses (UVA+UVB protection) and wide brim hats (4-inch brim around the entire circumference of the hat). Call for new or changing lesions.      Biopsy Wound Care Instructions  Leave the original bandage on for 24 hours if possible.  If the bandage becomes soaked or soiled before that time, it is OK to remove it and examine the wound.  A small amount of post-operative bleeding is normal.  If excessive bleeding occurs, remove the bandage, place gauze over the site and apply continuous pressure (no peeking) over the area for 30 minutes. If this does not work, please call our clinic as soon as possible or page your doctor if it is after hours.   Once a day, cleanse the wound with soap and water. It is fine to shower. If a thick crust develops you may use a Q-tip dipped into dilute hydrogen peroxide (mix 1:1 with water) to dissolve it.  Hydrogen peroxide can slow the healing process, so use it only as needed.    After washing, apply petroleum jelly (Vaseline) or an antibiotic ointment if your doctor prescribed one for you, followed by a bandage.    For best healing, the wound should be covered with a layer of ointment at all times. If you are not able to keep the area covered with a bandage to hold the ointment in  place, this may mean re-applying the ointment several times a day.  Continue this wound care until the wound has healed and is no longer open.   Itching and mild discomfort is normal during the healing process. However, if you develop pain or severe itching, please call our office.   If you have any discomfort, you can take Tylenol (acetaminophen) or ibuprofen as directed on the bottle. (Please do not take these if you have an allergy to them or cannot take them for another reason).  Some redness, tenderness and white or yellow material in the wound is normal healing.  If the area becomes very sore and red, or develops a thick yellow-green material (pus), it may be infected; please notify us.    If you have stitches, return to clinic as directed to have the stitches removed. You will continue wound care for 2-3 days after the stitches are removed.   Wound healing continues for up to one year following surgery. It is not unusual to experience pain in the scar from time to time during the interval.  If the pain becomes severe or the scar thickens, you should notify the office.    A slight amount of redness in a scar is expected for the first six months.  After six months, the redness will fade and the scar will soften and fade.  The color difference becomes less noticeable with time.  If there are any problems, return for a post-op  surgery check at your earliest convenience.  To improve the appearance of the scar, you can use silicone scar gel, cream, or sheets (such as Mederma or Serica) every night for up to one year. These are available over the counter (without a prescription).  Please call our office at [phone] for any questions or concerns.       Due to recent changes in healthcare laws, you may see results of your pathology and/or laboratory studies on MyChart before the doctors have had a chance to review them. We understand that in some cases there may be results that are  confusing or concerning to you. Please understand that not all results are received at the same time and often the doctors may need to interpret multiple results in order to provide you with the best plan of care or course of treatment. Therefore, we ask that you please give [place] 2 business days to thoroughly review all your results before contacting the office for clarification. Should we see a critical lab result, you will be contacted sooner.   If You Need Anything After Your Visit  If you have any questions or concerns for your doctor, please call our main line at [phone] and press option 4 to reach your doctor's medical assistant. If no one answers, please leave a voicemail as directed and we will return your call as soon as possible. Messages left after 4 pm will be answered the following business day.   You may also send [place] a message via [place]. We typically respond to MyChart messages within 1-2 business days.  For prescription refills, please ask your pharmacy to contact our office. Our fax number is [phone].  If you have an urgent issue when the clinic is closed that cannot wait until the next business day, you can page your doctor at the number below.    Please note that while we do our best to be available for urgent issues outside of office hours, we are not available 24/7.   If you have an urgent issue and are unable to reach [place], you may choose to seek medical care at your doctor's office, retail clinic, urgent care center, or emergency room.  If you have a medical emergency, please immediately call 911 or go to the emergency department.  Pager Numbers  - Dr. Nehemiah Massed: [phone]  - Dr. Laurence Ferrari: [phone]  - Dr. Nicole Kindred: [phone]  In the event of inclement weather, please call our main line at [phone] for an update on the status of any delays or closures.  Dermatology Medication Tips: Please keep the boxes that topical medications come in in order to  help keep track of the instructions about where and how to use these. Pharmacies typically print the medication instructions only on the boxes and not directly on the medication tubes.   If your medication is too expensive, please contact our office at [phone] option 4 or send [place] a message through [place].   We are unable to tell what your co-pay for medications will be in advance as this is different depending on your insurance coverage. However, we may be able to find a substitute medication at lower cost or fill out paperwork to get insurance to cover a needed medication.   If a prior authorization is required to get your medication covered by your insurance company, please allow [place] 1-2 business days to complete this process.  Drug prices often vary depending on where the prescription is filled and some pharmacies may offer  cheaper prices.  The website www.goodrx.com contains coupons for medications through different pharmacies. The prices here do not account for what the cost may be with help from insurance (it may be cheaper with your insurance), but the website can give you the price if you did not use any insurance.  - You can print the associated coupon and take it with your prescription to the pharmacy.  - You may also stop by our office during regular business hours and pick up a GoodRx coupon card.  - If you need your prescription sent electronically to a different pharmacy, notify our office through [facility] or by phone at [phone] option 4.     Si Usted Necesita Algo Despus de Su Visita  Tambin puede enviarnos un mensaje a travs de Pharmacist, community. Por lo general respondemos a los mensajes de MyChart en el transcurso de 1 a 2 das hbiles.  Para renovar recetas, por favor pida a su farmacia que se ponga en contacto con nuestra oficina. Harland Dingwall de fax es [place] [phone].  Si tiene un asunto urgente cuando la clnica est cerrada y que no puede esperar hasta  el siguiente da hbil, puede llamar/localizar a su doctor(a) al nmero que aparece a continuacin.   Por favor, tenga en cuenta que aunque hacemos todo lo posible para estar disponibles para asuntos urgentes fuera del horario de [place], no estamos disponibles las 24 horas del da, los 7 das de la [place].   Si tiene un problema urgente y no puede comunicarse con nosotros, puede optar por buscar atencin mdica  en el consultorio de su doctor(a), en una clnica privada, en un centro de atencin urgente o en una sala de emergencias.  Si tiene Engineering geologist, por favor llame inmediatamente al 911 o vaya a la sala de emergencias.  Nmeros de bper  - Dr. Nehemiah Massed: [phone]  - Dra. Moye: [phone]  - Dra. Nicole Kindred: [phone]  En caso de inclemencias del [place], por favor llame a Johnsie Kindred principal al [phone] para una actualizacin sobre el [place] de cualquier retraso o cierre.  Consejos para la medicacin en dermatologa: Por favor, guarde las cajas en las que vienen los medicamentos de uso tpico para ayudarle a seguir las instrucciones sobre dnde y cmo usarlos. Las farmacias generalmente imprimen las instrucciones del medicamento slo en las cajas y no directamente en los tubos del [place].   Si su medicamento es muy caro, por favor, pngase en contacto con Zigmund Daniel llamando al [phone] y presione la opcin 4 o envenos un mensaje a travs de Pharmacist, community.   No podemos decirle cul ser su copago por los medicamentos por adelantado ya que esto es diferente dependiendo de la cobertura de su seguro. Sin embargo, es posible que podamos encontrar un medicamento sustituto a Electrical engineer un formulario para que el seguro cubra el medicamento que se considera necesario.   Si se requiere una autorizacin previa para que su compaa de seguros [place] su medicamento, por favor permtanos de 1 a 2 das hbiles para completar este proceso.  Los precios de los  medicamentos varan con frecuencia dependiendo del Environmental consultant de dnde se surte la receta y alguna farmacias pueden ofrecer precios ms baratos.  El sitio web www.goodrx.com tiene cupones para medicamentos de Airline pilot. Los precios aqu no tienen en cuenta lo que podra costar con la ayuda del seguro (puede ser ms barato con su seguro), pero el sitio web puede darle el precio si no utiliz ningn  seguro.  - Puede imprimir el cupn correspondiente y llevarlo con su receta a la farmacia.  - Tambin puede pasar por nuestra oficina durante el horario de atencin regular y Charity fundraiser una tarjeta de cupones de GoodRx.  - Si necesita que su receta se enve electrnicamente a una farmacia diferente, informe a nuestra oficina a travs de MyChart de [place] o por telfono llamando al [phone] y presione la opcin 4.

## 2023-02-01 NOTE — Progress Notes (Signed)
Follow-Up Visit   Subjective  Alexander Howe is a 77 y.o. male who presents for the following: [place] f/u. Patient used fluorouracil cream at scalp.Aad some redness and crusting. Doing well. Has a spot at his neck.  The patient has spots, moles and lesions to be evaluated, some may be new or changing and the patient has concerns that these could be cancer.    The following portions of the chart were reviewed this encounter and updated as appropriate: medications, allergies, medical history  Review of Systems:  No other skin or systemic complaints except as noted in HPI or Assessment and Plan.  Objective  Well appearing patient in no apparent distress; mood and affect are within normal limits.   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 Anesthesia: the lesion was anesthetized in a standard fashion   Anesthetic:  1% lidocaine w/ epinephrine 1-100,000 buffered w/ 8.4% NaHCO3 Punch size:  3 mm Suture size:  4-0 Suture type: nylon   Suture removal (days):  3 Hemostasis achieved with: suture and aluminum chloride   Outcome: patient tolerated procedure well   Post-procedure details: wound care instructions given   Additional details:  Mupirocin and a dressing applied  Specimen 1 - Surgical pathology Differential Diagnosis: r/o scc  Check Margins: No  Actinic Damage with PreCancerous Actinic Keratoses Counseling for Topical Chemotherapy Management: Patient exhibits: - Severe, confluent actinic changes with pre-cancerous actinic  keratoses that is secondary to cumulative UV radiation exposure over time - Condition that is severe; chronic, not at goal. - diffuse scaly erythematous macules and papules with underlying dyspigmentation - Discussed Prescription "Field Treatment" topical Chemotherapy for Severe, Chronic Confluent Actinic Changes with Pre-Cancerous Actinic Keratoses Field treatment involves treatment of an entire area of skin that has confluent Actinic Changes (Sun/ Ultraviolet light damage) and PreCancerous Actinic Keratoses by method of PhotoDynamic Therapy (PDT) and/or prescription Topical Chemotherapy agents such as 5-fluorouracil, 5-fluorouracil/calcipotriene, and/or imiquimod.  The purpose is to decrease the number of clinically evident and subclinical PreCancerous lesions to prevent progression to development of skin cancer by chemically destroying early precancer changes that may or may not be visible.  It has been shown to reduce the risk of developing skin cancer in the treated area. As a result of treatment, redness, scaling, crusting, and open sores may occur during treatment course. One or more than one of these methods may be used and may have to be used several times to control, suppress and eliminate the PreCancerous changes. Discussed treatment course, expected reaction, and possible side effects. - Recommend daily broad spectrum sunscreen SPF 30+ to sun-exposed areas, reapply every 2 hours as needed.  - Staying in the shade or wearing long sleeves, sun glasses (UVA+UVB protection) and wide brim hats (4-inch brim around the entire circumference of the hat) are also recommended. - Call for new or changing lesions. - Plan 5FU therapy to scalp and face. Given trip, will wait until follow-up and plan to debride skin prior to treatment to improve penetration. Discussed red  light PDT may be more effective but he declines today. He dislikes the heat with the PDT.    ACTINIC KERATOSIS  Actinic keratoses are  precancerous spots that appear secondary to cumulative UV radiation exposure/sun exposure over time. They are chronic with expected duration over 1 year. A portion of actinic keratoses will progress to squamous cell carcinoma of the skin. It is not possible to reliably predict which spots will progress to skin cancer and so treatment is recommended to prevent development of skin cancer.  Recommend daily broad spectrum sunscreen SPF 30+ to sun-exposed areas, reapply every 2 hours as needed.  Recommend staying in the shade or wearing long sleeves, sun glasses (UVA+UVB protection) and wide brim hats (4-inch brim around the entire circumference of the hat). Call for new or changing lesions.  Prior to procedure, discussed risks of blister formation, small wound, skin dyspigmentation, or rare scar following cryotherapy. Recommend Vaseline ointment to treated areas while healing.  Destruction Procedure Note Destruction method: cryotherapy   Informed consent: discussed and consent obtained   Lesion destroyed using liquid nitrogen: Yes   Cryotherapy cycles:  2 Outcome: patient tolerated procedure well with no complications   Post-procedure details: wound care instructions given   Locations: right ear, left cheek, left postuaricular neck,   # of Lesions Treated: 3  Discussed rechecking aks at scalp and face  Return for 2 week suture removal nurse visit , tbse in May.  I, Ruthell Rummage, CMA, am acting as scribe for Forest Gleason, MD.   Documentation: I have reviewed the above documentation for accuracy and completeness, and I agree with the above.  Forest Gleason, MD

## 2023-02-06 ENCOUNTER — Telehealth: Payer: Self-pay

## 2023-02-06 NOTE — Telephone Encounter (Signed)
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 Thank you!

## 2023-02-07 ENCOUNTER — Telehealth: Payer: Self-pay

## 2023-02-07 NOTE — Telephone Encounter (Signed)
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 Thank you!

## 2023-02-15 ENCOUNTER — Ambulatory Visit (INDEPENDENT_AMBULATORY_CARE_PROVIDER_SITE_OTHER): Payer: Medicare HMO | Admitting: Dermatology

## 2023-02-15 ENCOUNTER — Encounter: Payer: Self-pay | Admitting: Dermatology

## 2023-02-15 VITALS — BP 132/81 | HR 51

## 2023-02-15 DIAGNOSIS — Z4802 Encounter for removal of sutures: Secondary | ICD-10-CM

## 2023-02-15 DIAGNOSIS — L738 Other specified follicular disorders: Secondary | ICD-10-CM | POA: Diagnosis not present

## 2023-02-15 NOTE — Patient Instructions (Addendum)
Due to recent changes in healthcare laws, you may see results of your pathology and/or laboratory studies on MyChart before the doctors have had a chance to review them. We understand that in some cases there may be results that are confusing or concerning to you. Please understand that not all results are received at the same time and often the doctors may need to interpret multiple results in order to provide you with the best plan of care or course of treatment. Therefore, we ask that you please give us 2 business days to thoroughly review all your results before contacting the office for clarification. Should we see a critical lab result, you will be contacted sooner.   If You Need Anything After Your Visit  If you have any questions or concerns for your doctor, please call our main line at [phone] and press option 4 to reach your doctor's medical assistant. If no one answers, please leave a voicemail as directed and we will return your call as soon as possible. Messages left after 4 pm will be answered the following business day.   You may also send us a message via MyChart. We typically respond to MyChart messages within 1-2 business days.  For prescription refills, please ask your pharmacy to contact our office. Our fax number is [phone].  If you have an urgent issue when the clinic is closed that cannot wait until the next business day, you can page your doctor at the number below.    Please note that while we do our best to be available for urgent issues outside of office hours, we are not available 24/7.   If you have an urgent issue and are unable to reach us, you may choose to seek medical care at your doctor's office, retail clinic, urgent care center, or emergency room.  If you have a medical emergency, please immediately call 911 or go to the emergency department.  Pager Numbers  - Dr. Kowalski: [phone]  - Dr. Moye: [phone]  - Dr. Stewart:  [phone]  In the event of inclement weather, please call our main line at [phone] for an update on the status of any delays or closures.  Dermatology Medication Tips: Please keep the boxes that topical medications come in in order to help keep track of the instructions about where and how to use these. Pharmacies typically print the medication instructions only on the boxes and not directly on the medication tubes.   If your medication is too expensive, please contact our office at [phone] option 4 or send us a message through MyChart.   We are unable to tell what your co-pay for medications will be in advance as this is different depending on your insurance coverage. However, we may be able to find a substitute medication at lower cost or fill out paperwork to get insurance to cover a needed medication.   If a prior authorization is required to get your medication covered by your insurance company, please allow us 1-2 business days to complete this process.  Drug prices often vary depending on where the prescription is filled and some pharmacies may offer cheaper prices.  The website www.goodrx.com contains coupons for medications through different pharmacies. The prices here do not account for what the cost may be with help from insurance (it may be cheaper with your insurance), but the website can give you the price if you did not use any insurance.  - You can print the associated coupon and take it with   your prescription to the pharmacy.  - You may also stop by our office during regular business hours and pick up a GoodRx coupon card.  - If you need your prescription sent electronically to a different pharmacy, notify our office through [place] [facility] MyChart or by phone at [phone] option 4.     Si Usted Necesita Algo Despus de Su Visita  Tambin puede enviarnos un mensaje a travs de MyChart. Por lo general respondemos a los mensajes de MyChart en el transcurso de 1 a 2  das hbiles.  Para renovar recetas, por favor pida a su farmacia que se ponga en contacto con nuestra oficina. Nuestro nmero de fax es el [phone].  Si tiene un asunto urgente cuando la clnica est cerrada y que no puede esperar hasta el siguiente da hbil, puede llamar/localizar a su doctor(a) al nmero que aparece a continuacin.   Por favor, tenga en cuenta que aunque hacemos todo lo posible para estar disponibles para asuntos urgentes fuera del horario de oficina, no estamos disponibles las 24 horas del da, los 7 das de la semana.   Si tiene un problema urgente y no puede comunicarse con nosotros, puede optar por buscar atencin mdica  en el consultorio de su doctor(a), en una clnica privada, en un centro de atencin urgente o en una sala de emergencias.  Si tiene una emergencia mdica, por favor llame inmediatamente al 911 o vaya a la sala de emergencias.  Nmeros de bper  - Dr. Kowalski: [phone]  - Dra. Moye: [phone]  - Dra. Stewart: [phone]  En caso de inclemencias del tiempo, por favor llame a nuestra lnea principal al [phone] para una actualizacin sobre el estado de cualquier retraso o cierre.  Consejos para la medicacin en dermatologa: Por favor, guarde las cajas en las que vienen los medicamentos de uso tpico para ayudarle a seguir las instrucciones sobre dnde y cmo usarlos. Las farmacias generalmente imprimen las instrucciones del medicamento slo en las cajas y no directamente en los tubos del medicamento.   Si su medicamento es muy caro, por favor, pngase en contacto con nuestra oficina llamando al [phone] y presione la opcin 4 o envenos un mensaje a travs de MyChart.   No podemos decirle cul ser su copago por los medicamentos por adelantado ya que esto es diferente dependiendo de la cobertura de su seguro. Sin embargo, es posible que podamos encontrar un medicamento sustituto a menor costo o llenar un formulario para que el  seguro cubra el medicamento que se considera necesario.   Si se requiere una autorizacin previa para que su compaa de seguros cubra su medicamento, por favor permtanos de 1 a 2 das hbiles para completar este proceso.  Los precios de los medicamentos varan con frecuencia dependiendo del lugar de dnde se surte la receta y alguna farmacias pueden ofrecer precios ms baratos.  El sitio web www.goodrx.com tiene cupones para medicamentos de diferentes farmacias. Los precios aqu no tienen en cuenta lo que podra costar con la ayuda del seguro (puede ser ms barato con su seguro), pero el sitio web puede darle el precio si no utiliz ningn seguro.  - Puede imprimir el cupn correspondiente y llevarlo con su receta a la farmacia.  - Tambin puede pasar por nuestra oficina durante el horario de atencin regular y recoger una tarjeta de cupones de GoodRx.  - Si necesita que su receta se enve electrnicamente a una farmacia diferente, informe a nuestra oficina a travs de MyChart de [facility]   o por telfono llamando al [phone] y presione la opcin 4.  

## 2023-02-15 NOTE — Progress Notes (Signed)
   Follow-Up Visit   Subjective  Alexander Howe is a 77 y.o. male who presents for the following: Suture removal at right posterior neck  Pathology showed SEBACEOUS GLAND HYPERPLASIA  The following portions of the chart were reviewed this encounter and updated as appropriate: medications, allergies, medical history  Review of Systems:  No other skin or systemic complaints except as noted in HPI or Assessment and Plan.  Objective  Well appearing patient in no apparent distress; mood and affect are within normal limits.  Areas Examined: neck  Relevant physical exam findings are noted in the Assessment and Plan.   Assessment & Plan    Encounter for Removal of Sutures - Incision site is clean, dry and intact. - Wound cleansed, sutures removed, wound cleansed and steri strips applied.  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 - Scars remodel for a full year. - Once steri-strips fall off, patient can apply over-the-counter silicone scar cream once to twice a day to help with scar remodeling if desired. - Patient advised to call with any concerns or if they notice any new or changing lesions.  RTC as scheduled  I, Asher Muir, CMA, am acting as scribe for Darden Dates, MD.   Documentation: I have reviewed the above documentation for accuracy and completeness, and I agree with the above.  Darden Dates, MD

## 2023-03-07 ENCOUNTER — Encounter: Payer: Medicare Other | Admitting: Dermatology

## 2023-03-22 ENCOUNTER — Ambulatory Visit (INDEPENDENT_AMBULATORY_CARE_PROVIDER_SITE_OTHER): Payer: Medicare HMO | Admitting: Dermatology

## 2023-03-22 ENCOUNTER — Encounter: Payer: Self-pay | Admitting: Dermatology

## 2023-03-22 DIAGNOSIS — Z872 Personal history of diseases of the skin and subcutaneous tissue: Secondary | ICD-10-CM

## 2023-03-22 DIAGNOSIS — W908XXA Exposure to other nonionizing radiation, initial encounter: Secondary | ICD-10-CM | POA: Diagnosis not present

## 2023-03-22 DIAGNOSIS — L578 Other skin changes due to chronic exposure to nonionizing radiation: Secondary | ICD-10-CM | POA: Diagnosis not present

## 2023-03-22 DIAGNOSIS — L570 Actinic keratosis: Secondary | ICD-10-CM

## 2023-03-22 DIAGNOSIS — X32XXXA Exposure to sunlight, initial encounter: Secondary | ICD-10-CM

## 2023-03-22 MED ORDER — TRIAMCINOLONE ACETONIDE 0.025 % EX OINT: 1.0000 | TOPICAL_OINTMENT | Freq: Two times a day (BID) | CUTANEOUS | 1 refills | Status: DC

## 2023-03-22 NOTE — Patient Instructions (Addendum)
Prior to treatment, spot treat areas with CeraVe Rough and Bumpy.   - Start 5-fluorouracil/calcipotriene cream twice a day for 4 days to affected areas including face, twice daily for 7 days to scalp and left ear. Prescription sent to [facility]. Patient advised they will receive an email to purchase the medication online and have it sent to their home. Patient provided with handout reviewing treatment course and side effects and advised to call or message [place] on MyChart with any concerns.  Reviewed course of treatment and expected reaction.  Patient advised to expect inflammation and crusting and advised that erosions are possible.  Patient advised to be diligent with sun protection during and after treatment. Counseled to keep medication out of reach of children and pets.  Instructions for [facility] Medications  One or more of your medications was sent to the [facility] mail order compounding pharmacy. You will receive an email from them and can purchase the medicine through that link. It will then be mailed to your home at the address you confirmed. If for any reason you do not receive an email from them, please check your spam folder. If you still do not find the email, please let us know. [facility] phone number is [phone].  Due to recent changes in healthcare laws, you may see results of your pathology and/or laboratory studies on MyChart before the doctors have had a chance to review them. We understand that in some cases there may be results that are confusing or concerning to you. Please understand that not all results are received at the same time and often the doctors may need to interpret multiple results in order to provide you with the best plan of care or course of treatment. Therefore, we ask that you please give [place] 2 business days to thoroughly review all your results before contacting the office for clarification. Should we see a critical lab  result, you will be contacted sooner.   If You Need Anything After Your Visit  If you have any questions or concerns for your doctor, please call our main line at [phone] and press option 4 to reach your doctor's medical assistant. If no one answers, please leave a voicemail as directed and we will return your call as soon as possible. Messages left after 4 pm will be answered the following business day.   You may also send [place] a message via MyChart. We typically respond to MyChart messages within 1-2 business days.  For prescription refills, please ask your pharmacy to contact our office. Our fax number is [phone].  If you have an urgent issue when the clinic is closed that cannot wait until the next business day, you can page your doctor at the number below.    Please note that while we do our best to be available for urgent issues outside of office hours, we are not available 24/7.   If you have an urgent issue and are unable to reach [place], you may choose to seek medical care at your doctor's office, retail clinic, urgent care center, or emergency room.  If you have a medical emergency, please immediately call 911 or go to the emergency department.  Pager Numbers  - Dr. Gwen Pounds: [phone]  - Dr. Neale Burly: [phone]  - Dr. Roseanne Reno: [phone]  In the event of inclement weather, please call our main line at [phone] for an update on the status of any delays or closures.  Dermatology Medication Tips: Please keep the  boxes that topical medications come in in order to help keep track of the instructions about where and how to use these. Pharmacies typically print the medication instructions only on the boxes and not directly on the medication tubes.   If your medication is too expensive, please contact our office at [phone] option 4 or send [place] a message through MyChart.   We are unable to tell what your co-pay for medications will be in advance as this is  different depending on your insurance coverage. However, we may be able to find a substitute medication at lower cost or fill out paperwork to get insurance to cover a needed medication.   If a prior authorization is required to get your medication covered by your insurance company, please allow [place] 1-2 business days to complete this process.  Drug prices often vary depending on where the prescription is filled and some pharmacies may offer cheaper prices.  The website www.goodrx.com contains coupons for medications through different pharmacies. The prices here do not account for what the cost may be with help from insurance (it may be cheaper with your insurance), but the website can give you the price if you did not use any insurance.  - You can print the associated coupon and take it with your prescription to the pharmacy.  - You may also stop by our office during regular business hours and pick up a GoodRx coupon card.  - If you need your prescription sent electronically to a different pharmacy, notify our office through [facility]  or by phone at [phone] option 4.

## 2023-03-22 NOTE — Progress Notes (Signed)
Follow-Up Visit   Subjective  Alexander Howe is a 77 y.o. male who presents for the following: patient here today to have scalp debrided prior to 5FU/calcipotriene.   Patient defers TBSE today and would like to schedule with Dr. Katrinka Blazing.   The following portions of the chart were reviewed this encounter and updated as appropriate: medications, allergies, medical history  Review of Systems:  No other skin or systemic complaints except as noted in HPI or Assessment and Plan.  Objective  Well appearing patient in no apparent distress; mood and affect are within normal limits.   A focused examination was performed of the following areas: Face, neck, scalp and ears  Relevant exam findings are noted in the Assessment and Plan.    Assessment & Plan   HISTORY OF PRECANCEROUS ACTINIC KERATOSIS - site(s) of PreCancerous Actinic Keratosis clear today. - these may recur and new lesions may form requiring treatment to prevent transformation into skin cancer - observe for new or changing spots and contact [facility] for appointment if occur - photoprotection with sun protective clothing; sunglasses and broad spectrum sunscreen with SPF of at least 30 + and frequent self skin exams recommended - yearly exams by a dermatologist recommended for persons with history of PreCancerous Actinic Keratoses  ACTINIC DAMAGE WITH PRECANCEROUS ACTINIC KERATOSES Counseling for Topical Chemotherapy Management: Patient exhibits: - Severe, confluent actinic changes with pre-cancerous actinic keratoses that is secondary to cumulative UV radiation exposure over time - Condition that is severe; chronic, not at goal. - diffuse scaly erythematous macules and papules with underlying dyspigmentation - Discussed Prescription "Field Treatment" topical Chemotherapy for Severe, Chronic Confluent Actinic Changes with Pre-Cancerous Actinic Keratoses Field treatment involves treatment of an entire area of skin  that has confluent Actinic Changes (Sun/ Ultraviolet light damage) and PreCancerous Actinic Keratoses by method of PhotoDynamic Therapy (PDT) and/or prescription Topical Chemotherapy agents such as 5-fluorouracil, 5-fluorouracil/calcipotriene, and/or imiquimod.  The purpose is to decrease the number of clinically evident and subclinical PreCancerous lesions to prevent progression to development of skin cancer by chemically destroying early precancer changes that may or may not be visible.  It has been shown to reduce the risk of developing skin cancer in the treated area. As a result of treatment, redness, scaling, crusting, and open sores may occur during treatment course. One or more than one of these methods may be used and may have to be used several times to control, suppress and eliminate the PreCancerous changes. Discussed treatment course, expected reaction, and possible side effects. - Recommend daily broad spectrum sunscreen SPF 30+ to sun-exposed areas, reapply every 2 hours as needed.  - Staying in the shade or wearing long sleeves, sun glasses (UVA+UVB protection) and wide brim hats (4-inch brim around the entire circumference of the hat) are also recommended. - Call for new or changing lesions. - Face and scale debrided today prior to treatment with 5FU/calcipotriene. - Sample of CeraVe Rough and Bumpy given to patient to use prior to treatment. - Start 5-fluorouracil/calcipotriene cream twice a day for 4 days to affected areas including face, twice daily for 7 days to scalp and left ear. Prescription sent to [facility]. Patient advised they will receive an email to purchase the medication online and have it sent to their home. Patient provided with handout reviewing treatment course and side effects and advised to call or message [place] on MyChart with any concerns.  Reviewed course of treatment and expected reaction.  Patient advised  to expect inflammation and crusting  and advised that erosions are possible.  Patient advised to be diligent with sun protection during and after treatment. Counseled to keep medication out of reach of children and pets.    Return for TBSE, first available with Dr. Katrinka Blazing.  Anise Salvo, RMA, am acting as scribe for Darden Dates, MD .   Documentation: I have reviewed the above documentation for accuracy and completeness, and I agree with the above.  Darden Dates, MD

## 2023-04-02 ENCOUNTER — Telehealth: Payer: Self-pay

## 2023-04-02 NOTE — Telephone Encounter (Signed)
Patient wife calling pt used 5FU/Calcipotriene cream for 4 days on his face, now his face is red and irritated she would like to know if he can use hydrocortisone cream, okay to use Hydrocortisone cream bid x 2 weeks then stop

## 2023-04-26 ENCOUNTER — Telehealth: Payer: Self-pay | Admitting: Dermatology

## 2023-04-26 NOTE — Telephone Encounter (Signed)
Patient and wife informed of Dr. Onnie Boer recommendations. MyChart message sent.

## 2023-04-26 NOTE — Telephone Encounter (Signed)
Called Dr. Odessa Fleming office to see if he is okay with [place] recommending nicotinamide for Alexander Howe given his renal function.  He did not have concerns.  Please call Alexander Howe and let him know I recommend Niacinamide or Nicotinamide 500mg  twice per day to lower risk of non-melanoma skin cancer by approximately 25%. Rarely it could cause an increased risk of muscle pain with his cholesterol medicine, but this is not expected at the doses I am recommending. If he develops new muscle pain, he should stop the supplement and follow-up with his primary care. This is usually available at [facility].  Thank you!

## 2023-05-24 ENCOUNTER — Other Ambulatory Visit: Payer: Self-pay | Admitting: Pulmonary Disease

## 2023-05-24 DIAGNOSIS — N1831 Chronic kidney disease, stage 3a: Secondary | ICD-10-CM

## 2023-05-27 ENCOUNTER — Emergency Department (HOSPITAL_COMMUNITY): Payer: Medicare HMO

## 2023-05-27 ENCOUNTER — Other Ambulatory Visit: Payer: Self-pay

## 2023-05-27 ENCOUNTER — Encounter (HOSPITAL_COMMUNITY): Payer: Self-pay | Admitting: Emergency Medicine

## 2023-05-27 ENCOUNTER — Inpatient Hospital Stay (HOSPITAL_COMMUNITY)
Admission: RE | Admit: 2023-05-27 | Discharge: 2023-06-03 | DRG: 444 | Disposition: A | Discharge status: Other Acute Inpatient Hospital | Payer: Medicare HMO | Attending: Internal Medicine | Admitting: Internal Medicine

## 2023-05-27 DIAGNOSIS — I2510 Atherosclerotic heart disease of native coronary artery without angina pectoris: Secondary | ICD-10-CM | POA: Diagnosis present

## 2023-05-27 DIAGNOSIS — Z85820 Personal history of malignant melanoma of skin: Secondary | ICD-10-CM

## 2023-05-27 DIAGNOSIS — F32A Depression, unspecified: Secondary | ICD-10-CM | POA: Diagnosis present

## 2023-05-27 DIAGNOSIS — K859 Acute pancreatitis without necrosis or infection, unspecified: Secondary | ICD-10-CM

## 2023-05-27 DIAGNOSIS — Y848 Other medical procedures as the cause of abnormal reaction of the patient, or of later complication, without mention of misadventure at the time of the procedure: Secondary | ICD-10-CM | POA: Diagnosis not present

## 2023-05-27 DIAGNOSIS — Z87442 Personal history of urinary calculi: Secondary | ICD-10-CM

## 2023-05-27 DIAGNOSIS — N1832 Chronic kidney disease, stage 3b: Secondary | ICD-10-CM | POA: Diagnosis present

## 2023-05-27 DIAGNOSIS — I5032 Chronic diastolic (congestive) heart failure: Secondary | ICD-10-CM | POA: Diagnosis present

## 2023-05-27 DIAGNOSIS — N179 Acute kidney failure, unspecified: Secondary | ICD-10-CM | POA: Diagnosis present

## 2023-05-27 DIAGNOSIS — F40240 Claustrophobia: Secondary | ICD-10-CM | POA: Diagnosis present

## 2023-05-27 DIAGNOSIS — Z86010 Personal history of colonic polyps: Secondary | ICD-10-CM

## 2023-05-27 DIAGNOSIS — Z8719 Personal history of other diseases of the digestive system: Secondary | ICD-10-CM

## 2023-05-27 DIAGNOSIS — Z85828 Personal history of other malignant neoplasm of skin: Secondary | ICD-10-CM

## 2023-05-27 DIAGNOSIS — D1803 Hemangioma of intra-abdominal structures: Secondary | ICD-10-CM | POA: Diagnosis present

## 2023-05-27 DIAGNOSIS — Z79899 Other long term (current) drug therapy: Secondary | ICD-10-CM

## 2023-05-27 DIAGNOSIS — I4891 Unspecified atrial fibrillation: Secondary | ICD-10-CM | POA: Insufficient documentation

## 2023-05-27 DIAGNOSIS — K8063 Calculus of gallbladder and bile duct with acute cholecystitis with obstruction: Secondary | ICD-10-CM | POA: Diagnosis not present

## 2023-05-27 DIAGNOSIS — D490 Neoplasm of unspecified behavior of digestive system: Secondary | ICD-10-CM

## 2023-05-27 DIAGNOSIS — F419 Anxiety disorder, unspecified: Secondary | ICD-10-CM | POA: Diagnosis present

## 2023-05-27 DIAGNOSIS — D136 Benign neoplasm of pancreas: Secondary | ICD-10-CM | POA: Diagnosis present

## 2023-05-27 DIAGNOSIS — Z887 Allergy status to serum and vaccine status: Secondary | ICD-10-CM

## 2023-05-27 DIAGNOSIS — Z96653 Presence of artificial knee joint, bilateral: Secondary | ICD-10-CM | POA: Diagnosis present

## 2023-05-27 DIAGNOSIS — R001 Bradycardia, unspecified: Secondary | ICD-10-CM | POA: Diagnosis not present

## 2023-05-27 DIAGNOSIS — I714 Abdominal aortic aneurysm, without rupture, unspecified: Secondary | ICD-10-CM | POA: Diagnosis present

## 2023-05-27 DIAGNOSIS — I739 Peripheral vascular disease, unspecified: Secondary | ICD-10-CM | POA: Diagnosis present

## 2023-05-27 DIAGNOSIS — R7401 Elevation of levels of liver transaminase levels: Secondary | ICD-10-CM

## 2023-05-27 DIAGNOSIS — K2980 Duodenitis without bleeding: Secondary | ICD-10-CM | POA: Diagnosis present

## 2023-05-27 DIAGNOSIS — R17 Unspecified jaundice: Secondary | ICD-10-CM

## 2023-05-27 DIAGNOSIS — E43 Unspecified severe protein-calorie malnutrition: Secondary | ICD-10-CM | POA: Insufficient documentation

## 2023-05-27 DIAGNOSIS — N210 Calculus in bladder: Secondary | ICD-10-CM | POA: Diagnosis present

## 2023-05-27 DIAGNOSIS — E876 Hypokalemia: Secondary | ICD-10-CM | POA: Diagnosis present

## 2023-05-27 DIAGNOSIS — N183 Chronic kidney disease, stage 3 unspecified: Secondary | ICD-10-CM | POA: Diagnosis present

## 2023-05-27 DIAGNOSIS — E7800 Pure hypercholesterolemia, unspecified: Secondary | ICD-10-CM | POA: Diagnosis present

## 2023-05-27 DIAGNOSIS — I130 Hypertensive heart and chronic kidney disease with heart failure and stage 1 through stage 4 chronic kidney disease, or unspecified chronic kidney disease: Secondary | ICD-10-CM | POA: Diagnosis present

## 2023-05-27 DIAGNOSIS — K2970 Gastritis, unspecified, without bleeding: Secondary | ICD-10-CM

## 2023-05-27 DIAGNOSIS — I10 Essential (primary) hypertension: Secondary | ICD-10-CM | POA: Diagnosis present

## 2023-05-27 DIAGNOSIS — R7989 Other specified abnormal findings of blood chemistry: Secondary | ICD-10-CM | POA: Diagnosis present

## 2023-05-27 DIAGNOSIS — I4892 Unspecified atrial flutter: Secondary | ICD-10-CM | POA: Diagnosis not present

## 2023-05-27 DIAGNOSIS — D696 Thrombocytopenia, unspecified: Secondary | ICD-10-CM | POA: Diagnosis present

## 2023-05-27 DIAGNOSIS — G4733 Obstructive sleep apnea (adult) (pediatric): Secondary | ICD-10-CM

## 2023-05-27 DIAGNOSIS — K8062 Calculus of gallbladder and bile duct with acute cholecystitis without obstruction: Secondary | ICD-10-CM

## 2023-05-27 DIAGNOSIS — Z7982 Long term (current) use of aspirin: Secondary | ICD-10-CM

## 2023-05-27 DIAGNOSIS — Z6833 Body mass index (BMI) 33.0-33.9, adult: Secondary | ICD-10-CM

## 2023-05-27 DIAGNOSIS — K858 Other acute pancreatitis without necrosis or infection: Secondary | ICD-10-CM | POA: Diagnosis not present

## 2023-05-27 DIAGNOSIS — Y838 Other surgical procedures as the cause of abnormal reaction of the patient, or of later complication, without mention of misadventure at the time of the procedure: Secondary | ICD-10-CM | POA: Diagnosis not present

## 2023-05-27 DIAGNOSIS — Z87891 Personal history of nicotine dependence: Secondary | ICD-10-CM

## 2023-05-27 DIAGNOSIS — R1084 Generalized abdominal pain: Secondary | ICD-10-CM

## 2023-05-27 DIAGNOSIS — E669 Obesity, unspecified: Secondary | ICD-10-CM | POA: Diagnosis present

## 2023-05-27 DIAGNOSIS — K9189 Other postprocedural complications and disorders of digestive system: Secondary | ICD-10-CM

## 2023-05-27 DIAGNOSIS — K8070 Calculus of gallbladder and bile duct without cholecystitis without obstruction: Secondary | ICD-10-CM

## 2023-05-27 DIAGNOSIS — K8050 Calculus of bile duct without cholangitis or cholecystitis without obstruction: Secondary | ICD-10-CM

## 2023-05-27 LAB — BASIC METABOLIC PANEL
Anion gap: 12 (ref 5–15)
BUN: 37 mg/dL — ABNORMAL HIGH (ref 8–23)
CO2: 30 mmol/L (ref 22–32)
Calcium: 10.4 mg/dL — ABNORMAL HIGH (ref 8.9–10.3)
Chloride: 96 mmol/L — ABNORMAL LOW (ref 98–111)
Creatinine, Ser: 2.03 mg/dL — ABNORMAL HIGH (ref 0.61–1.24)
GFR, Estimated: 33 mL/min — ABNORMAL LOW (ref >60)
Glucose, Bld: 140 mg/dL — ABNORMAL HIGH (ref 70–99)
Potassium: 4.7 mmol/L (ref 3.5–5.1)
Sodium: 138 mmol/L (ref 135–145)

## 2023-05-27 LAB — URINALYSIS, ROUTINE W REFLEX MICROSCOPIC
Bacteria, UA: NONE SEEN
Bilirubin Urine: NEGATIVE
Glucose, UA: NEGATIVE mg/dL
Hgb urine dipstick: NEGATIVE
Ketones, ur: NEGATIVE mg/dL
Leukocytes,Ua: NEGATIVE
Nitrite: NEGATIVE
Protein, ur: 30 mg/dL — AB
Specific Gravity, Urine: 1.012 (ref 1.005–1.030)
pH: 7 (ref 5.0–8.0)

## 2023-05-27 LAB — HEPATIC FUNCTION PANEL
ALT: 240 U/L — ABNORMAL HIGH (ref 0–44)
AST: 276 U/L — ABNORMAL HIGH (ref 15–41)
Albumin: 4.2 g/dL (ref 3.5–5.0)
Alkaline Phosphatase: 52 U/L (ref 38–126)
Bilirubin, Direct: 1.7 mg/dL — ABNORMAL HIGH (ref 0.0–0.2)
Indirect Bilirubin: 4 mg/dL — ABNORMAL HIGH (ref 0.3–0.9)
Total Bilirubin: 5.7 mg/dL — ABNORMAL HIGH (ref 0.3–1.2)
Total Protein: 7.7 g/dL (ref 6.5–8.1)

## 2023-05-27 LAB — CBC
HCT: 50.7 % (ref 39.0–52.0)
Hemoglobin: 16.5 g/dL (ref 13.0–17.0)
MCH: 31.5 pg (ref 26.0–34.0)
MCHC: 32.5 g/dL (ref 30.0–36.0)
MCV: 96.9 fL (ref 80.0–100.0)
Platelets: 166 10*3/uL (ref 150–400)
RBC: 5.23 MIL/uL (ref 4.22–5.81)
RDW: 13.9 % (ref 11.5–15.5)
WBC: 12.5 10*3/uL — ABNORMAL HIGH (ref 4.0–10.5)
nRBC: 0 % (ref 0.0–0.2)

## 2023-05-27 MED ORDER — MORPHINE SULFATE (PF) 4 MG/ML IV SOLN
4.0000 mg | Freq: Once | INTRAVENOUS | Status: AC
  Administered 2023-05-27: 4 mg via INTRAVENOUS
  Filled 2023-05-27: qty 1

## 2023-05-27 MED ORDER — IOHEXOL 350 MG/ML SOLN
75.0000 mL | Freq: Once | INTRAVENOUS | Status: AC | PRN
  Administered 2023-05-27: 75 mL via INTRAVENOUS

## 2023-05-27 NOTE — ED Triage Notes (Signed)
Pt c/o bilateral flank pain with N/V since 1200. Pt has hx of kidney stones.

## 2023-05-27 NOTE — ED Provider Notes (Signed)
Alexander Howe   CSN: [id number] Arrival date & time: 05/27/23  2023     History  Chief Complaint  Patient presents with   Flank Pain    Alexander Howe is a 77 y.o. male with history of multiple aortic aneurysms and kidney stones who presents with concern for vague abdominal pain that progressed to severe and now radiating to the bilateral back with associated N and NBNB emesis. No urinary symptoms. No fevers or chills, phenergan suppository at home without relief.   I have reviewed his medical records, CKD stage 3, HTN, depression. Wife Marjie at the bedside.   HPI     Home Medications Prior to Admission medications   Medication Sig Start Date End Date Taking? Authorizing Provider  albuterol (VENTOLIN HFA) 108 (90 Base) MCG/ACT inhaler SMARTSIG:2 Inhalation Via Inhaler Every 6 Hours PRN 07/29/20   [provider]  ALPRAZolam Prudy Feeler) 0.5 MG tablet Take 0.5 mg by mouth 2 (two) times daily as needed for anxiety.    [provider]  amLODipine (NORVASC) 5 MG tablet Take 5 mg by mouth daily.    [provider]  aspirin EC 81 MG tablet Take 81 mg by mouth daily.    [provider]  atorvastatin (LIPITOR) 80 MG tablet Take 80 mg by mouth daily.    [provider]  buPROPion (ZYBAN) 150 MG 12 hr tablet Take 150 mg by mouth 2 (two) times daily. Patient not taking: Reported on 09/24/2020    [provider]  Cholecalciferol (VITAMIN D3) 2000 units TABS Take 1 tablet by mouth daily.    [provider]  docusate sodium (COLACE) 100 MG capsule Take 200 mg by mouth daily. Patient not taking: Reported on 09/24/2020    [provider]  enoxaparin (LOVENOX) 40 MG/0.4ML injection Inject 0.4 mLs (40 mg total) into the skin daily. Patient not taking: Reported on 09/24/2020 12/29/15   Tera Partridge, PA  furosemide (LASIX) 20 MG tablet Take 20 mg by mouth daily as  needed. Patient not taking: Reported on 09/24/2020    [provider]  hydrochlorothiazide (HYDRODIURIL) 25 MG tablet Take 25 mg by mouth daily.    [provider]  magnesium oxide (MAG-OX) 400 MG tablet Take 400 mg by mouth daily. Patient not taking: Reported on 10/13/2020    [provider]  metoprolol succinate (TOPROL-XL) 100 MG 24 hr tablet Take 100 mg by mouth at bedtime. Take with or immediately following a meal.    [provider]  Omega-3 Fatty Acids (FISH OIL CONCENTRATE PO) Take 1 capsule by mouth daily.    [provider]  oxyCODONE (OXY IR/ROXICODONE) 5 MG immediate release tablet Take 1-2 tablets (5-10 mg total) by mouth every 4 (four) hours as needed for severe pain or breakthrough pain. Patient not taking: Reported on 09/24/2020 12/29/15   Tera Partridge, PA  pantoprazole (PROTONIX) 40 MG tablet Take 40 mg by mouth daily.    [provider]  PARoxetine (PAXIL) 10 MG tablet Take 10 mg by mouth daily.    [provider]  potassium chloride SA (K-DUR,KLOR-CON) 20 MEQ tablet Take 20 mEq by mouth daily.    [provider]  predniSONE (DELTASONE) 20 MG tablet Take 20 mg by mouth daily. Patient not taking: Reported on 10/20/2020 07/26/20   [provider]  quinapril (ACCUPRIL) 40 MG tablet Take 40 mg by mouth daily.    [provider]  sulfamethoxazole-trimethoprim (BACTRIM) 400-80 MG tablet SMARTSIG:1 Tablet(s) By Mouth Every 12 Hours Patient not taking: No sig reported 07/30/20   [provider]  tamsulosin (FLOMAX) 0.4 MG CAPS capsule Take 0.4 mg by mouth daily. Patient not taking: Reported on 10/20/2020 08/13/20   [provider]  traMADol (ULTRAM) 50 MG tablet Take 1-2 tablets (50-100 mg total) by mouth every 4 (four) hours as needed for moderate pain. Patient not taking: Reported on 09/24/2020 12/29/15   Tera Partridge, PA  triamcinolone (KENALOG) 0.025 % ointment Apply 1 Application  topically 2 (two) times daily. As needed for up to 5 days to face, 2 weeks to scalp 03/22/23   Moye, [place], MD  triamcinolone ointment (KENALOG) 0.1 % Apply twice daily to scalp for 1 week if needed after treatment 09/28/22   Neale Burly, [place], MD      Allergies    Pneumococcal vaccines    Review of Systems   Review of Systems  Constitutional:  Positive for appetite change.  HENT: Negative.    Gastrointestinal:  Positive for abdominal pain, nausea and vomiting.  Genitourinary:  Positive for flank pain.  Musculoskeletal:  Positive for back pain.    Physical Exam Updated Vital Signs BP (!) 141/92 (BP Location: Right Arm)   Pulse 67   Temp 97.9 F (36.6 C) (Oral)   Resp 16   Ht 6' 1.5" (1.867 m)   Wt 116.6 kg   SpO2 97%   BMI 33.45 kg/m  Physical Exam Vitals and nursing Howe reviewed.  Constitutional:      Appearance: He is not ill-appearing or toxic-appearing.  HENT:     Head: Normocephalic and atraumatic.     Mouth/Throat:     Mouth: Mucous membranes are moist.     Pharynx: No oropharyngeal exudate or posterior oropharyngeal erythema.  Eyes:     General:        Right eye: No discharge.        Left eye: No discharge.     Conjunctiva/sclera: Conjunctivae normal.  Cardiovascular:     Rate and Rhythm: Normal rate and regular rhythm.     Pulses: Normal pulses.     Heart sounds: Normal heart sounds. No murmur heard. Pulmonary:     Effort: Pulmonary effort is normal. No respiratory distress.     Breath sounds: Normal breath sounds. No wheezing or rales.  Abdominal:     General: Bowel sounds are normal. There is no distension.     Tenderness: There is generalized abdominal tenderness and tenderness in the periumbilical area. There is no right CVA tenderness or left CVA tenderness.  Musculoskeletal:        General: No deformity.     Cervical back: Neck supple.  Skin:    General: Skin is warm and dry.  Neurological:     Mental Status: He is alert. Mental status is at  baseline.  Psychiatric:        Mood and Affect: Mood normal.     ED Results / Procedures / Treatments   Labs (all labs ordered are listed, but only abnormal results are displayed) Labs Reviewed  URINALYSIS, ROUTINE W REFLEX MICROSCOPIC - Abnormal; Notable for the following components:      Result Value   Protein, ur 30 (*)    All other components within normal limits  BASIC METABOLIC PANEL - Abnormal; Notable for the following components:   Chloride 96 (*)    Glucose, Bld 140 (*)    BUN 37 (*)  Creatinine, Ser 2.03 (*)    Calcium 10.4 (*)    GFR, Estimated 33 (*)    All other components within normal limits  CBC - Abnormal; Notable for the following components:   WBC 12.5 (*)    All other components within normal limits  HEPATIC FUNCTION PANEL - Abnormal; Notable for the following components:   AST 276 (*)    ALT 240 (*)    Total Bilirubin 5.7 (*)    Bilirubin, Direct 1.7 (*)    Indirect Bilirubin 4.0 (*)    All other components within normal limits  COMPREHENSIVE METABOLIC PANEL - Abnormal; Notable for the following components:   Glucose, Bld 121 (*)    BUN 35 (*)    Creatinine, Ser 1.91 (*)    AST 291 (*)    ALT 282 (*)    Total Bilirubin 5.5 (*)    GFR, Estimated 36 (*)    All other components within normal limits  CBC - Abnormal; Notable for the following components:   WBC 11.2 (*)    Platelets 149 (*)    All other components within normal limits  LIPASE, BLOOD  PROTIME-INR    EKG None  Radiology US Abdomen Limited RUQ (LIVER/GB)  Result Date: 05/28/2023 EXAM: ULTRASOUND ABDOMEN LIMITED RIGHT UPPER QUADRANT COMPARISON:  None Available. FINDINGS: Gallbladder: No wall thickening or pericholecystic fluid. Stones are noted within the gallbladder, which measure up to 1.3 cm. No sonographic Murphy sign noted by sonographer. Common bile duct: Diameter: 6 mm, at the upper limit of normal, although the biliary tree is quite difficult to visualize. No intrahepatic  biliary ductal dilatation is seen. Liver: Evaluation is limited by limited acoustic windows. Within this limitation, no focal lesion identified. Within normal limits in parenchymal echogenicity. Portal vein is patent on color Doppler imaging with normal direction of blood flow towards the liver. Other: None. IMPRESSION: 1. Cholelithiasis without evidence of acute cholecystitis. 2. Common bile duct measures at the upper limit of normal, although the biliary tree is quite difficult to visualize. No intrahepatic biliary ductal dilatation is seen. 3. Limited evaluation of the liver due to limited acoustic windows. Within this limitation, no focal lesion is seen. Electronically Signed   By: Wiliam Ke M.D.   On: 05/28/2023 01:54   CT Angio Chest/Abd/Pel for Dissection W and/or Wo Contrast  Result Date: 05/28/2023 CLINICAL DATA:  Abdominal pain.  History of aortic aneurysm. EXAM: CT ANGIOGRAPHY CHEST, ABDOMEN AND PELVIS TECHNIQUE: Non-contrast CT of the chest was initially obtained. Multidetector CT imaging through the chest, abdomen and pelvis was performed using the standard protocol during bolus administration of intravenous contrast. Multiplanar reconstructed images and MIPs were obtained and reviewed to evaluate the vascular anatomy. RADIATION DOSE REDUCTION: This exam was performed according to the departmental dose-optimization program which includes automated exposure control, adjustment of the mA and/or kV according to patient size and/or use of iterative reconstruction technique. CONTRAST:  75mL OMNIPAQUE IOHEXOL 350 MG/ML SOLN COMPARISON:  CTA chest 07/26/2020; aortic ultrasound 08/25/2019 FINDINGS: CTA CHEST FINDINGS Cardiovascular: Normal heart size. No pericardial effusion. Aortic atherosclerotic calcification without dissection the ascending aorta is mildly dilated measuring 40 mm in diameter. Mediastinum/Nodes: Trachea and esophagus are unremarkable. No thoracic adenopathy. Lungs/Pleura: Scattered  areas of scarring and bronchiolectasis, favored postinfectious given findings on CT 07/26/2020. No focal consolidation, pleural effusion, or pneumothorax. Musculoskeletal: No chest wall abnormality. No acute or significant osseous findings. Review of the MIP images confirms the above findings. CTA ABDOMEN AND PELVIS FINDINGS  VASCULAR Aorta: Calcified atherosclerotic plaque without aneurysm or dissection. Celiac: Patent without aneurysm or dissection. SMA: Patent without aneurysm or dissection. Renals: Mild narrowing at the origins of the renal arteries bilaterally secondary to calcified plaque. No aneurysm or dissection. IMA: Patent. Inflow: Scattered calcified atherosclerotic plaque without significant narrowing. No aneurysm or dissection. Veins: No obvious venous abnormality within the limitations of this arterial phase study. Review of the MIP images confirms the above findings. NON-VASCULAR Hepatobiliary: Hepatic cysts. Cholelithiasis. No evidence of cholecystitis. No biliary dilation. Pancreas: Ill-defined low-density lesion in the uncinate process of the pancreas (6/202) measuring 16 x 11 mm. Additional low-density lesions in the tail of the pancreas measuring 18 mm (6/181) and 14 mm (6/188). No evidence of acute pancreatitis. No pancreatic ductal dilation. Spleen: Unremarkable. Adrenals/Urinary Tract: Stable adrenal glands compared with 07/26/2020. Low-attenuation lesions in the kidneys are statistically likely to represent cysts. No follow-up is required. Four calculi are present within the bladder with the largest measuring 12 mm and located near the right UVJ. No hydronephrosis to suggest obstruction. Mild diffuse bladder wall thickening. Stomach/Bowel: Normal caliber large and small bowel. Postoperative change about the sigmoid colon. No bowel wall thickening. Normal appendix. Stomach is within normal limits. Lymphatic: No lymphadenopathy. Reproductive: Enlarged prostate. Other: No free intraperitoneal  fluid or air. Musculoskeletal: No acute fracture. Review of the MIP images confirms the above findings. IMPRESSION: 1. No acute aortic syndrome. 2. Mildly dilated ascending aorta measuring 40 mm in diameter. Recommend annual imaging followup by CTA or MRA. This recommendation follows 2010 ACCF/AHA/AATS/ACR/ASA/SCA/SCAI/SIR/STS/SVM Guidelines for the Diagnosis and Management of Patients with Thoracic Aortic Disease. Circulation. 2010; 121: W237-S283. Aortic aneurysm NOS (ICD10-I71.9) 3. Multiple low-density lesions in the pancreas measuring up to 18 mm. Recommend further evaluation with nonemergent MRI with and without contrast. 4. Four calculi are present within the bladder with the largest measuring 12 mm and located near the right UVJ. No hydronephrosis to suggest obstruction. Aortic Atherosclerosis (ICD10-I70.0). Electronically Signed   By: Minerva Fester M.D.   On: 05/28/2023 00:26    Procedures Procedures    Medications Ordered in ED Medications  metoprolol succinate (TOPROL-XL) 24 hr tablet 100 mg (has no administration in time range)  ALPRAZolam (XANAX) tablet 0.5 mg (has no administration in time range)  PARoxetine (PAXIL) tablet 10 mg (has no administration in time range)  heparin injection 5,000 Units (has no administration in time range)  sodium chloride flush (NS) 0.9 % injection 3 mL (3 mLs Intravenous Given 05/28/23 0443)  0.9 %  sodium chloride infusion ( Intravenous New Bag/Given 05/28/23 0441)  acetaminophen (TYLENOL) tablet 325 mg (has no administration in time range)  ondansetron (ZOFRAN) tablet 4 mg (has no administration in time range)    Or  ondansetron (ZOFRAN) injection 4 mg (has no administration in time range)  fentaNYL (SUBLIMAZE) injection 12.5-50 mcg (has no administration in time range)  morphine (PF) 4 MG/ML injection 4 mg (4 mg Intravenous Given 05/27/23 2247)  iohexol (OMNIPAQUE) 350 MG/ML injection 75 mL (75 mLs Intravenous Contrast Given 05/27/23 2353)   ondansetron (ZOFRAN) injection 4 mg (4 mg Intravenous Given 05/28/23 0102)  morphine (PF) 2 MG/ML injection 2 mg (2 mg Intravenous Given 05/28/23 0102)  sodium chloride 0.9 % bolus 1,000 mL (0 mLs Intravenous Stopped 05/28/23 0429)    ED Course/ Medical Decision Making/ A&P Clinical Course as of 05/28/23 0507  Mon May 28, 2023  1517 Consult to Dr. Antionette Char, hospitalist, who is agreeable to admitting this patient to his service.  I appreciate his collaboration in the care of this patient.  [RS]    Clinical Course User Index [RS] Hanah Moultry, Eugene Gavia, PA-C                             Medical Decision Making 77 y/o male with hx of aortic aneurysms and nephrolithiasis, presents with abdominal and back pain with N/V.   HTN on intake, bradycardic. Cardiopulmonary exam as reassuring, abdominal exam as above. Symmetric radial and pedal pulses bilaterally.   DDX includes but is not limited to AAA/expanding aneurysm, pancreatitis, cholelithiasis/cholangitis, diverticulitis, appendicitis.   Amount and/or Complexity of Data Reviewed Labs: ordered.    Details: CBC with leukocytosis of 12.5, CMP with aki with cr of 2, increased from baseline of 1.2, hepatic panel with transaminitis with AST/ALT of 276, 240, elevated T bili 5.7. UA unremarkable. Lipase is normal, 40 Radiology: ordered.    Details:  CT dissection study with stable aorta, four calculi in the bladder wtih another of 12mm near the R UVJ. RUQ [place] with CBD of upper limit of normal.     Risk Prescription drug management. Decision regarding hospitalization.   Suspect AKI secondary to lasix use (startd on this medication 1 week ago). unclear etiology of abnormal biliary labs. ? possible passed gallstone, with now -resolved obstruction.   Patient will requried admission to the hospital for abnormal biliary labs. Omarion and his wife voiced understanding of his medical evaluation and treatment plan. Each of their questions answered to their  expressed satisfaction.  Return precautions were given. They are amenable to plan for admission at this time.  They are amenable to plan for admission.   This chart was dictated using voice recognition software, Dragon. Despite the best efforts of this provider to proofread and correct errors, errors may still occur which can change documentation meaning.  Final Clinical Impression(s) / ED Diagnoses Final diagnoses:  Transaminitis  Elevated bilirubin  Generalized abdominal pain    Rx / DC Orders ED Discharge Orders     None         Sherrilee Gilles 05/28/23 0507    Charlynne Pander, MD 05/29/23 1454

## 2023-05-28 ENCOUNTER — Encounter (HOSPITAL_COMMUNITY): Payer: Self-pay | Admitting: Family Medicine

## 2023-05-28 ENCOUNTER — Emergency Department (HOSPITAL_COMMUNITY): Payer: Medicare HMO

## 2023-05-28 DIAGNOSIS — K8689 Other specified diseases of pancreas: Secondary | ICD-10-CM

## 2023-05-28 DIAGNOSIS — Z87891 Personal history of nicotine dependence: Secondary | ICD-10-CM | POA: Diagnosis not present

## 2023-05-28 DIAGNOSIS — N1832 Chronic kidney disease, stage 3b: Secondary | ICD-10-CM | POA: Diagnosis present

## 2023-05-28 DIAGNOSIS — Y848 Other medical procedures as the cause of abnormal reaction of the patient, or of later complication, without mention of misadventure at the time of the procedure: Secondary | ICD-10-CM | POA: Diagnosis not present

## 2023-05-28 DIAGNOSIS — G4733 Obstructive sleep apnea (adult) (pediatric): Secondary | ICD-10-CM | POA: Diagnosis present

## 2023-05-28 DIAGNOSIS — E669 Obesity, unspecified: Secondary | ICD-10-CM | POA: Diagnosis present

## 2023-05-28 DIAGNOSIS — R7989 Other specified abnormal findings of blood chemistry: Secondary | ICD-10-CM | POA: Diagnosis not present

## 2023-05-28 DIAGNOSIS — F419 Anxiety disorder, unspecified: Secondary | ICD-10-CM

## 2023-05-28 DIAGNOSIS — Y838 Other surgical procedures as the cause of abnormal reaction of the patient, or of later complication, without mention of misadventure at the time of the procedure: Secondary | ICD-10-CM | POA: Diagnosis not present

## 2023-05-28 DIAGNOSIS — I129 Hypertensive chronic kidney disease with stage 1 through stage 4 chronic kidney disease, or unspecified chronic kidney disease: Secondary | ICD-10-CM | POA: Diagnosis not present

## 2023-05-28 DIAGNOSIS — K9189 Other postprocedural complications and disorders of digestive system: Secondary | ICD-10-CM | POA: Diagnosis not present

## 2023-05-28 DIAGNOSIS — I130 Hypertensive heart and chronic kidney disease with heart failure and stage 1 through stage 4 chronic kidney disease, or unspecified chronic kidney disease: Secondary | ICD-10-CM | POA: Diagnosis present

## 2023-05-28 DIAGNOSIS — I5032 Chronic diastolic (congestive) heart failure: Secondary | ICD-10-CM | POA: Diagnosis present

## 2023-05-28 DIAGNOSIS — R1084 Generalized abdominal pain: Secondary | ICD-10-CM | POA: Diagnosis present

## 2023-05-28 DIAGNOSIS — K858 Other acute pancreatitis without necrosis or infection: Secondary | ICD-10-CM | POA: Diagnosis not present

## 2023-05-28 DIAGNOSIS — I4819 Other persistent atrial fibrillation: Secondary | ICD-10-CM | POA: Diagnosis not present

## 2023-05-28 DIAGNOSIS — N210 Calculus in bladder: Secondary | ICD-10-CM | POA: Diagnosis present

## 2023-05-28 DIAGNOSIS — Z6833 Body mass index (BMI) 33.0-33.9, adult: Secondary | ICD-10-CM | POA: Diagnosis not present

## 2023-05-28 DIAGNOSIS — R935 Abnormal findings on diagnostic imaging of other abdominal regions, including retroperitoneum: Secondary | ICD-10-CM

## 2023-05-28 DIAGNOSIS — K8070 Calculus of gallbladder and bile duct without cholecystitis without obstruction: Secondary | ICD-10-CM | POA: Diagnosis not present

## 2023-05-28 DIAGNOSIS — K2980 Duodenitis without bleeding: Secondary | ICD-10-CM | POA: Diagnosis not present

## 2023-05-28 DIAGNOSIS — E43 Unspecified severe protein-calorie malnutrition: Secondary | ICD-10-CM | POA: Diagnosis not present

## 2023-05-28 DIAGNOSIS — F418 Other specified anxiety disorders: Secondary | ICD-10-CM | POA: Diagnosis not present

## 2023-05-28 DIAGNOSIS — R001 Bradycardia, unspecified: Secondary | ICD-10-CM | POA: Diagnosis not present

## 2023-05-28 DIAGNOSIS — K2970 Gastritis, unspecified, without bleeding: Secondary | ICD-10-CM | POA: Diagnosis not present

## 2023-05-28 DIAGNOSIS — R109 Unspecified abdominal pain: Secondary | ICD-10-CM

## 2023-05-28 DIAGNOSIS — N179 Acute kidney failure, unspecified: Secondary | ICD-10-CM | POA: Diagnosis present

## 2023-05-28 DIAGNOSIS — I4891 Unspecified atrial fibrillation: Secondary | ICD-10-CM | POA: Diagnosis not present

## 2023-05-28 DIAGNOSIS — F32A Depression, unspecified: Secondary | ICD-10-CM | POA: Diagnosis present

## 2023-05-28 DIAGNOSIS — Z96653 Presence of artificial knee joint, bilateral: Secondary | ICD-10-CM | POA: Diagnosis present

## 2023-05-28 DIAGNOSIS — N183 Chronic kidney disease, stage 3 unspecified: Secondary | ICD-10-CM | POA: Diagnosis not present

## 2023-05-28 DIAGNOSIS — E7800 Pure hypercholesterolemia, unspecified: Secondary | ICD-10-CM | POA: Diagnosis present

## 2023-05-28 DIAGNOSIS — I10 Essential (primary) hypertension: Secondary | ICD-10-CM | POA: Diagnosis not present

## 2023-05-28 DIAGNOSIS — F40240 Claustrophobia: Secondary | ICD-10-CM | POA: Diagnosis present

## 2023-05-28 DIAGNOSIS — K8063 Calculus of gallbladder and bile duct with acute cholecystitis with obstruction: Secondary | ICD-10-CM | POA: Diagnosis not present

## 2023-05-28 DIAGNOSIS — D696 Thrombocytopenia, unspecified: Secondary | ICD-10-CM

## 2023-05-28 DIAGNOSIS — I4892 Unspecified atrial flutter: Secondary | ICD-10-CM | POA: Diagnosis not present

## 2023-05-28 DIAGNOSIS — R7401 Elevation of levels of liver transaminase levels: Secondary | ICD-10-CM | POA: Diagnosis present

## 2023-05-28 DIAGNOSIS — I2584 Coronary atherosclerosis due to calcified coronary lesion: Secondary | ICD-10-CM | POA: Diagnosis not present

## 2023-05-28 DIAGNOSIS — E876 Hypokalemia: Secondary | ICD-10-CM | POA: Diagnosis present

## 2023-05-28 DIAGNOSIS — I2510 Atherosclerotic heart disease of native coronary artery without angina pectoris: Secondary | ICD-10-CM | POA: Diagnosis not present

## 2023-05-28 DIAGNOSIS — I739 Peripheral vascular disease, unspecified: Secondary | ICD-10-CM | POA: Diagnosis present

## 2023-05-28 DIAGNOSIS — I714 Abdominal aortic aneurysm, without rupture, unspecified: Secondary | ICD-10-CM | POA: Diagnosis present

## 2023-05-28 LAB — CBC
HCT: 50.7 % (ref 39.0–52.0)
Hemoglobin: 16.6 g/dL (ref 13.0–17.0)
MCH: 32.1 pg (ref 26.0–34.0)
MCHC: 32.7 g/dL (ref 30.0–36.0)
MCV: 98.1 fL (ref 80.0–100.0)
Platelets: 149 10*3/uL — ABNORMAL LOW (ref 150–400)
RBC: 5.17 MIL/uL (ref 4.22–5.81)
RDW: 13.9 % (ref 11.5–15.5)
WBC: 11.2 10*3/uL — ABNORMAL HIGH (ref 4.0–10.5)
nRBC: 0 % (ref 0.0–0.2)

## 2023-05-28 LAB — COMPREHENSIVE METABOLIC PANEL
ALT: 282 U/L — ABNORMAL HIGH (ref 0–44)
AST: 291 U/L — ABNORMAL HIGH (ref 15–41)
Albumin: 3.5 g/dL (ref 3.5–5.0)
Alkaline Phosphatase: 53 U/L (ref 38–126)
Anion gap: 13 (ref 5–15)
BUN: 35 mg/dL — ABNORMAL HIGH (ref 8–23)
CO2: 22 mmol/L (ref 22–32)
Calcium: 9.8 mg/dL (ref 8.9–10.3)
Chloride: 103 mmol/L (ref 98–111)
Creatinine, Ser: 1.91 mg/dL — ABNORMAL HIGH (ref 0.61–1.24)
GFR, Estimated: 36 mL/min — ABNORMAL LOW (ref >60)
Glucose, Bld: 121 mg/dL — ABNORMAL HIGH (ref 70–99)
Potassium: 5.1 mmol/L (ref 3.5–5.1)
Sodium: 138 mmol/L (ref 135–145)
Total Bilirubin: 5.5 mg/dL — ABNORMAL HIGH (ref 0.3–1.2)
Total Protein: 6.8 g/dL (ref 6.5–8.1)

## 2023-05-28 LAB — HEPATITIS PANEL, ACUTE
HCV Ab: NONREACTIVE
Hep A IgM: NONREACTIVE
Hep B C IgM: NONREACTIVE
Hepatitis B Surface Ag: NONREACTIVE

## 2023-05-28 LAB — HEPATITIS A ANTIBODY, TOTAL: hep A Total Ab: NONREACTIVE

## 2023-05-28 LAB — LIPASE, BLOOD: Lipase: 40 U/L (ref 11–51)

## 2023-05-28 LAB — PROTIME-INR
INR: 1.1 (ref 0.8–1.2)
Prothrombin Time: 14.4 seconds (ref 11.4–15.2)

## 2023-05-28 MED ORDER — ONDANSETRON HCL 4 MG PO TABS: 4.0000 mg | ORAL_TABLET | Freq: Four times a day (QID) | ORAL | Status: DC | PRN

## 2023-05-28 MED ORDER — ONDANSETRON HCL 4 MG/2ML IJ SOLN
4.0000 mg | Freq: Four times a day (QID) | INTRAMUSCULAR | Status: DC | PRN
  Administered 2023-05-31 – 2023-06-02 (×6): 4 mg via INTRAVENOUS
  Filled 2023-05-28 (×7): qty 2

## 2023-05-28 MED ORDER — SODIUM CHLORIDE 0.9% FLUSH
3.0000 mL | Freq: Two times a day (BID) | INTRAVENOUS | Status: DC
  Administered 2023-05-28 – 2023-06-01 (×9): 3 mL via INTRAVENOUS

## 2023-05-28 MED ORDER — SODIUM CHLORIDE 0.9 % IV BOLUS
1000.0000 mL | Freq: Once | INTRAVENOUS | Status: AC
  Administered 2023-05-28: 1000 mL via INTRAVENOUS

## 2023-05-28 MED ORDER — FENTANYL CITRATE PF 50 MCG/ML IJ SOSY
12.5000 ug | PREFILLED_SYRINGE | INTRAMUSCULAR | Status: DC | PRN
  Administered 2023-05-31: 50 ug via INTRAVENOUS
  Filled 2023-05-28 (×2): qty 1

## 2023-05-28 MED ORDER — ALPRAZOLAM 0.5 MG PO TABS
0.5000 mg | ORAL_TABLET | Freq: Two times a day (BID) | ORAL | Status: DC | PRN
  Administered 2023-05-31: 0.5 mg via ORAL
  Filled 2023-05-28: qty 1

## 2023-05-28 MED ORDER — METOPROLOL SUCCINATE ER 50 MG PO TB24
100.0000 mg | ORAL_TABLET | Freq: Every day | ORAL | Status: DC
  Administered 2023-05-30: 100 mg via ORAL
  Filled 2023-05-28 (×3): qty 2

## 2023-05-28 MED ORDER — LORAZEPAM 1 MG PO TABS: 1.0000 mg | ORAL_TABLET | Freq: Once | ORAL | Status: DC

## 2023-05-28 MED ORDER — ACETAMINOPHEN 325 MG PO TABS: 325.0000 mg | ORAL_TABLET | Freq: Four times a day (QID) | ORAL | Status: DC | PRN

## 2023-05-28 MED ORDER — SODIUM CHLORIDE 0.9 % IV SOLN: INTRAVENOUS | Status: AC

## 2023-05-28 MED ORDER — MORPHINE SULFATE (PF) 2 MG/ML IV SOLN
2.0000 mg | Freq: Once | INTRAVENOUS | Status: AC
  Administered 2023-05-28: 2 mg via INTRAVENOUS
  Filled 2023-05-28: qty 1

## 2023-05-28 MED ORDER — ONDANSETRON HCL 4 MG/2ML IJ SOLN
4.0000 mg | Freq: Once | INTRAMUSCULAR | Status: AC
  Administered 2023-05-28: 4 mg via INTRAVENOUS
  Filled 2023-05-28: qty 2

## 2023-05-28 MED ORDER — PAROXETINE HCL 10 MG PO TABS
10.0000 mg | ORAL_TABLET | Freq: Every day | ORAL | Status: DC
  Administered 2023-05-28 – 2023-06-03 (×6): 10 mg via ORAL
  Filled 2023-05-28 (×7): qty 1

## 2023-05-28 MED ORDER — HEPARIN SODIUM (PORCINE) 5000 UNIT/ML IJ SOLN
5000.0000 [IU] | Freq: Three times a day (TID) | INTRAMUSCULAR | Status: AC
  Administered 2023-05-28 – 2023-05-29 (×2): 5000 [IU] via SUBCUTANEOUS
  Filled 2023-05-28 (×3): qty 1

## 2023-05-28 NOTE — Consult Note (Addendum)
Consultation Note   Referring Provider:  Emergency Services PCP: [name], MD Primary Gastroenterologist: Lynnae Prude, MD [facility])        Reason for consultation: Elevated liver tests  DOA: 05/27/2023         Hospital Day: 2   Attending physician's note  I have taken a history, reviewed the chart and examined the patient. I performed a substantive portion of this encounter, including complete performance of at least one of the key components, in conjunction with the APP. I agree with the APP's note, impression and recommendations.   77 year old very pleasant gentleman with history of stage III CKD, OSA on CPAP admitted with mid abdominal pain radiating to the back. He does not have severe abdominal pain but has generalized abdominal discomfort radiating to the back. He was having nausea with decreased appetite for past 2 to 3 months and has lost approximately 15 pounds unintentionally. He was also having dark urine for past 2 to 3 months Denies any changes in bowel habits  He has extreme claustrophobia, was initially reluctant to undergo MRCP but is agreeable to proceed with it  Abdominal ultrasound showed cholelithiasis with no acute cholecystitis, dilated CBD, upper limit of normal CT angio chest abdomen pelvis showed ill-defined low-density lesion in the uncinate process and tail of pancreas, no evidence of acute pancreatitis  He has elevated transaminases and mostly elevated indirect bilirubin >>direct bilirubin  Check acute hepatitis viral panel Will need to exclude CBD obstruction, further recommendations based on MRCP findings N.p.o. for MRCP with anaesthesia for severe claustrophobia    The patient was provided an opportunity to ask questions and all were answered. The patient agreed with the plan and demonstrated an understanding of the instructions.  Iona Beard , MD [phone]     Assessment and Plan   Patient is a 77 y.o. year old male with a past medical history of adenomatous colon polyps, OSA on CPAP, kidney stones, CKD3, depression, anxiety, AAA and descending aortic aneurysm, hypertension, hyperlipidemia   Acute bilateral flank pain, generalized abdominal pain, back pain, nausea and vomiting in setting of cholelithiasis  and abnormal liver chemistries ( mixed pattern).  No evidence for acute cholecystitis on imaging. 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  CTA shows multiple low-density lesions in the pancreas measuring up to 18 mm.  -Further evaluation by MRI  / MRCP  Chronic, intermittent thrombocytopenia.  Platelets stable at 149.  No splenomegaly on CTA  Mildly dilated ascending aorta measuring 40 mm in diameter.  Bladder calculi on CT scan  See PMH for additional medical history      History of Present Illness  Patient presented to ED last evening with bilateral flank pain, generalized abdominal pain, back pain, N/V.  His symptoms started yesterday around lunchtime after eating crackers and drinking coffee.  He had multiple episodes of vomiting.  The bilateral flank pain and abdominal pain was constant but would get worse every time  he vomited which was about every 30 to 40 minutes.  He has never had this type of pain before . The vomiting went on until around 7 PM when he decided to come to the ED. since receiving analgesics and antiemetics in the ED he feels much better today  ED workup notable for elevated liver chemistries, mild leukocytosis, mild thrombocytopenia Tbili 5.7 ( predominantly indirect), normal alk phos, AST 276, ALT 240, Cr 2.03 , WBC 12.5, normal hgb of 16.5,  platelets 166  Today platelets 149, WBC 11.2, Liver chemistries about the same as yesterday. CTA shows cholelithiasis without acute cholecystitis of biliary duct dilation.   RUQ US IMPRESSION: 1. Cholelithiasis without evidence of acute cholecystitis. 2. Common bile duct measures at the upper limit of normal, although the biliary tree is quite difficult to visualize. No intrahepatic biliary ductal dilatation is seen. 3. Limited evaluation of the liver due to limited acoustic windows. Within this limitation, no focal lesion is seen.  CTA chest / abd / pelvix IMPRESSION: 1. No acute aortic syndrome. 2. Mildly dilated ascending aorta measuring 40 mm in diameter. Recommend annual imaging followup by CTA or MRA. This recommendation follows 2010 ACCF/AHA/AATS/ACR/ASA/SCA/SCAI/SIR/STS/SVM Guidelines for the Diagnosis and Management of Patients with Thoracic Aortic Disease. Circulation. 2010; 121: H607-P710. Aortic aneurysm NOS (ICD10-I71.9) 3. Multiple low-density lesions in the pancreas measuring up to 18 mm. Recommend further evaluation with nonemergent MRI with and without contrast. 4. Four calculi are present within the bladder with the largest measuring 12 mm and located near the right UVJ. No hydronephrosis to suggest obstruction.  Regarding abnormal liver chemistries, he denies no prior history of this.  No recent alcohol use. He recently started what sounds like a new diuretic.   Patient has a history of colon polyps, followed by Dr. Mechele Collin   Labs and Imaging: Recent Labs    05/27/23 2043 05/28/23 0343  WBC 12.5* 11.2*  HGB 16.5 16.6  HCT 50.7 50.7  PLT 166 149*   Recent Labs    05/27/23 2043 05/28/23 0343  NA 138 138  K 4.7 5.1  CL 96* 103  CO2 30 22  GLUCOSE 140* 121*  BUN 37* 35*  CREATININE 2.03* 1.91*  CALCIUM 10.4* 9.8   Recent Labs    05/27/23 2247 05/28/23 0343  PROT 7.7 6.8  ALBUMIN 4.2 3.5  AST 276* 291*  ALT 240* 282*  ALKPHOS 52 53   BILITOT 5.7* 5.5*  BILIDIR 1.7*  --   IBILI 4.0*  --    No results for input(s): "HEPBSAG", "HCVAB", "HEPAIGM", "HEPBIGM" in the last 72 hours. Recent Labs    05/28/23 0408  LABPROT 14.4  INR 1.1    Previous GI Evaluation:  May 2023 polyp surveillance Colonoscopy ( Dr. Mechele Collin) - One diminutive polyp in the transverse colon, removed with a jumbo cold forceps. Resected and retrieved. - Two diminutive polyps in the sigmoid colon, removed with a jumbo cold forceps. Resected and retrieved. - One small polyp in the sigmoid colon, removed with a hot snare. Resected and retrieved. - One diminutive polyp in the rectum, removed with a cold snare. Resected and retrieved. Biopsied. DIAGNOSIS:  A. COLON POLYP, TRANSVERSE; COLD BIOPSY:  - TUBULAR ADENOMA.  - NEGATIVE FOR HIGH-GRADE DYSPLASIA AND MALIGNANCY.   B.  COLON POLYP X2, SIGMOID; COLD BIOPSY:  - PROMINENT LYMPHOID AGGREGATE (1).  - NO PATHOLOGIC CHANGE (1).   C.  COLON POLYP, SIGMOID; HOT SNARE:  - TUBULAR ADENOMA.  - NEGATIVE FOR HIGH-GRADE DYSPLASIA  AND MALIGNANCY.   D.  RECTUM POLYPS X5; COLD SNARE:  - HYPERPLASTIC POLYPS (2).  - HYPERPLASTIC EPITHELIAL CHANGE (FRAGMENTS).  - NEGATIVE FOR DYSPLASIA AND MALIGNANCY.     Past Medical History:  Diagnosis Date   AAA (abdominal aortic aneurysm) without rupture (HCC)    Abdominal aneurysm (HCC)    3.5 cm   Actinic keratosis 08/26/2020   right lateral chest   Anxiety    Basal cell carcinoma 09/18/2018   left upper back/one excised, one superficial   Basal cell carcinoma 11/28/2018   left post shoulder   Basal cell carcinoma 08/26/2020   left medial pretibial, EDC 10/2020   Basal cell carcinoma 03/15/2022   right neck, EDC   Basal cell carcinoma (BCC) 08/31/2022   Posterior base of neck superficial. 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 stones   Colon polyp    Depression    Diverticulitis    Diverticulosis    Hyperlipemia    Hypertension     Lipoma    Melanoma (HCC) 05/08/2018   in situ at right upper back/excision   Neoplasm    benign, colon   Onychomycosis    Osteoarthritis    Osteoarthritis    Rheumatic fever    Rheumatic fever    Sleep apnea    Squamous cell carcinoma of skin 10/13/2020   right lateral chest, EDC 12/09/20   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(HCC)    Venous stasis    Venous stasis    Vitamin B 12 deficiency     Past Surgical History:  Procedure Laterality Date   COLONOSCOPY W/ POLYPECTOMY     COLONOSCOPY WITH PROPOFOL N/A 04/05/2018   Procedure: COLONOSCOPY WITH PROPOFOL;  Surgeon: Scot Jun, MD;  Location: [facility] ENDOSCOPY;  Service: Endoscopy;  Laterality: N/A;   COLOSTOMY     COLOSTOMY REVERSAL     CYST REMOVAL LEG     KNEE ARTHROPLASTY Right 12/27/2015   Procedure: COMPUTER ASSISTED TOTAL KNEE ARTHROPLASTY;  Surgeon: Donato Heinz, MD;  Location: [facility] ORS;  Service: Orthopedics;  Laterality: Right;   KNEE ARTHROSCOPY Left    LIPOMA EXCISION     right thigh   LITHOTRIPSY     TOTAL KNEE ARTHROPLASTY Left 10/16/2012    History reviewed. No pertinent family history.  Prior to Admission medications   Medication Sig Start Date End Date Taking? Authorizing Provider  albuterol (VENTOLIN HFA) 108 (90 Base) MCG/ACT inhaler SMARTSIG:2 Inhalation Via Inhaler Every 6 Hours PRN 07/29/20   [provider]  ALPRAZolam Prudy Feeler) 0.5 MG tablet Take 0.5 mg by mouth 2 (two) times daily as needed for anxiety.    [provider]  amLODipine (NORVASC) 5 MG tablet Take 5 mg by mouth daily.    [provider]  aspirin EC 81 MG tablet Take 81 mg by mouth daily.    [provider]  atorvastatin (LIPITOR) 80 MG tablet Take 80 mg by mouth daily.    [provider]  buPROPion (ZYBAN) 150 MG 12 hr tablet Take 150 mg by mouth 2 (two) times daily. Patient not taking: Reported on 09/24/2020    [provider]  Cholecalciferol (VITAMIN D3) 2000 units TABS Take 1 tablet by  mouth daily.    [provider]  docusate sodium (COLACE) 100 MG capsule Take 200 mg by mouth daily. Patient not taking: Reported on 09/24/2020    [provider]  enoxaparin (LOVENOX) 40 MG/0.4ML injection Inject 0.4 mLs (  40 mg total) into the skin daily. Patient not taking: Reported on 09/24/2020 12/29/15   Tera Partridge, PA  furosemide (LASIX) 20 MG tablet Take 20 mg by mouth daily as needed. Patient not taking: Reported on 09/24/2020    [provider]  hydrochlorothiazide (HYDRODIURIL) 25 MG tablet Take 25 mg by mouth daily.    [provider]  magnesium oxide (MAG-OX) 400 MG tablet Take 400 mg by mouth daily. Patient not taking: Reported on 10/13/2020    [provider]  metoprolol succinate (TOPROL-XL) 100 MG 24 hr tablet Take 100 mg by mouth at bedtime. Take with or immediately following a meal.    [provider]  Omega-3 Fatty Acids (FISH OIL CONCENTRATE PO) Take 1 capsule by mouth daily.    [provider]  oxyCODONE (OXY IR/ROXICODONE) 5 MG immediate release tablet Take 1-2 tablets (5-10 mg total) by mouth every 4 (four) hours as needed for severe pain or breakthrough pain. Patient not taking: Reported on 09/24/2020 12/29/15   Tera Partridge, PA  pantoprazole (PROTONIX) 40 MG tablet Take 40 mg by mouth daily.    [provider]  PARoxetine (PAXIL) 10 MG tablet Take 10 mg by mouth daily.    [provider]  potassium chloride SA (K-DUR,KLOR-CON) 20 MEQ tablet Take 20 mEq by mouth daily.    [provider]  predniSONE (DELTASONE) 20 MG tablet Take 20 mg by mouth daily. Patient not taking: Reported on 10/20/2020 07/26/20   [provider]  quinapril (ACCUPRIL) 40 MG tablet Take 40 mg by mouth daily.    [provider]  sulfamethoxazole-trimethoprim (BACTRIM) 400-80 MG tablet SMARTSIG:1 Tablet(s) By Mouth Every 12 Hours Patient not taking: No sig reported 07/30/20   [provider]   tamsulosin (FLOMAX) 0.4 MG CAPS capsule Take 0.4 mg by mouth daily. Patient not taking: Reported on 10/20/2020 08/13/20   [provider]  traMADol (ULTRAM) 50 MG tablet Take 1-2 tablets (50-100 mg total) by mouth every 4 (four) hours as needed for moderate pain. Patient not taking: Reported on 09/24/2020 12/29/15   Tera Partridge, PA  triamcinolone (KENALOG) 0.025 % ointment Apply 1 Application topically 2 (two) times daily. As needed for up to 5 days to face, 2 weeks to scalp 03/22/23   [place], [place], MD  triamcinolone ointment (KENALOG) 0.1 % Apply twice daily to scalp for 1 week if needed after treatment 09/28/22   Neale Burly, [place], MD    Current Facility-Administered Medications  Medication Dose Route Frequency Provider Last Rate Last Admin   0.9 %  sodium chloride infusion   Intravenous Continuous Opyd, Lavone Neri, MD 100 mL/hr at 05/28/23 0441 New Bag at 05/28/23 0441   acetaminophen (TYLENOL) tablet 325 mg  325 mg Oral Q6H PRN Opyd, Lavone Neri, MD       ALPRAZolam Prudy Feeler) tablet 0.5 mg  0.5 mg Oral BID PRN Opyd, Lavone Neri, MD       fentaNYL (SUBLIMAZE) injection 12.5-50 mcg  12.5-50 mcg Intravenous Q2H PRN Opyd, Lavone Neri, MD       heparin injection 5,000 Units  5,000 Units Subcutaneous Q8H Opyd, Lavone Neri, MD   5,000 Units at 05/28/23 0615   metoprolol succinate (TOPROL-XL) 24 hr tablet 100 mg  100 mg Oral QHS Opyd, Lavone Neri, MD       ondansetron (ZOFRAN) tablet 4 mg  4 mg Oral Q6H PRN Opyd, Lavone Neri, MD       Or   ondansetron [facility])  injection 4 mg  4 mg Intravenous Q6H PRN Opyd, Lavone Neri, MD       PARoxetine (PAXIL) tablet 10 mg  10 mg Oral Daily Opyd, Lavone Neri, MD       sodium chloride flush (NS) 0.9 % injection 3 mL  3 mL Intravenous Q12H Opyd, Lavone Neri, MD   3 mL at 05/28/23 0443   Current Outpatient Medications  Medication Sig Dispense Refill   albuterol (VENTOLIN HFA) 108 (90 Base) MCG/ACT inhaler SMARTSIG:2 Inhalation Via Inhaler Every 6 Hours PRN     ALPRAZolam  (XANAX) 0.5 MG tablet Take 0.5 mg by mouth 2 (two) times daily as needed for anxiety.     amLODipine (NORVASC) 5 MG tablet Take 5 mg by mouth daily.     aspirin EC 81 MG tablet Take 81 mg by mouth daily.     atorvastatin (LIPITOR) 80 MG tablet Take 80 mg by mouth daily.     buPROPion (ZYBAN) 150 MG 12 hr tablet Take 150 mg by mouth 2 (two) times daily. (Patient not taking: Reported on 09/24/2020)     Cholecalciferol (VITAMIN D3) 2000 units TABS Take 1 tablet by mouth daily.     docusate sodium (COLACE) 100 MG capsule Take 200 mg by mouth daily. (Patient not taking: Reported on 09/24/2020)     enoxaparin (LOVENOX) 40 MG/0.4ML injection Inject 0.4 mLs (40 mg total) into the skin daily. (Patient not taking: Reported on 09/24/2020) 14 Syringe 0   furosemide (LASIX) 20 MG tablet Take 20 mg by mouth daily as needed. (Patient not taking: Reported on 09/24/2020)     hydrochlorothiazide (HYDRODIURIL) 25 MG tablet Take 25 mg by mouth daily.     magnesium oxide (MAG-OX) 400 MG tablet Take 400 mg by mouth daily. (Patient not taking: Reported on 10/13/2020)     metoprolol succinate (TOPROL-XL) 100 MG 24 hr tablet Take 100 mg by mouth at bedtime. Take with or immediately following a meal.     Omega-3 Fatty Acids (FISH OIL CONCENTRATE PO) Take 1 capsule by mouth daily.     oxyCODONE (OXY IR/ROXICODONE) 5 MG immediate release tablet Take 1-2 tablets (5-10 mg total) by mouth every 4 (four) hours as needed for severe pain or breakthrough pain. (Patient not taking: Reported on 09/24/2020) 80 tablet 0   pantoprazole (PROTONIX) 40 MG tablet Take 40 mg by mouth daily.     PARoxetine (PAXIL) 10 MG tablet Take 10 mg by mouth daily.     potassium chloride SA (K-DUR,KLOR-CON) 20 MEQ tablet Take 20 mEq by mouth daily.     predniSONE (DELTASONE) 20 MG tablet Take 20 mg by mouth daily. (Patient not taking: Reported on 10/20/2020)     quinapril (ACCUPRIL) 40 MG tablet Take 40 mg by mouth daily.     sulfamethoxazole-trimethoprim  (BACTRIM) 400-80 MG tablet SMARTSIG:1 Tablet(s) By Mouth Every 12 Hours (Patient not taking: No sig reported)     tamsulosin (FLOMAX) 0.4 MG CAPS capsule Take 0.4 mg by mouth daily. (Patient not taking: Reported on 10/20/2020)     traMADol (ULTRAM) 50 MG tablet Take 1-2 tablets (50-100 mg total) by mouth every 4 (four) hours as needed for moderate pain. (Patient not taking: Reported on 09/24/2020) 60 tablet 1   triamcinolone (KENALOG) 0.025 % ointment Apply 1 Application topically 2 (two) times daily. As needed for up to 5 days to face, 2 weeks to scalp 30 g 1   triamcinolone ointment (KENALOG) 0.1 % Apply twice daily to scalp for 1  week if needed after treatment 30 g 1    Allergies as of 05/27/2023 - Review Complete 05/27/2023  Allergen Reaction Noted   Pneumococcal vaccines Swelling 04/04/2018    Social History   Socioeconomic History   Marital status: Married    Spouse name: Not on file   Number of children: Not on file   Years of education: Not on file   Highest education level: Not on file  Occupational History   Not on file  Tobacco Use   Smoking status: Former   Smokeless tobacco: Former    Types: Chew    Quit date: 11/29/1990  Vaping Use   Vaping status: Never Used  Substance and Sexual Activity   Alcohol use: Yes    Comment: rare   Drug use: Never   Sexual activity: Yes  Other Topics Concern   Not on file  Social History Narrative   Not on file   Social Determinants of Health   Financial Resource Strain: Low Risk  (01/24/2023)   Received from [facility], [facility]   Overall Financial Resource Strain (CARDIA)    Difficulty of Paying Living Expenses: Not hard at all  Food Insecurity: No Food Insecurity (01/24/2023)   Received from [facility], [facility]   Hunger Vital Sign    Worried About Running Out of Food in the Last Year: Never true    Ran Out of Food in the Last Year: Never true   Transportation Needs: No Transportation Needs (01/24/2023)   Received from [facility], [facility]   [facility] - Transportation    In the past 12 months, has lack of transportation kept you from medical appointments or from getting medications?: No    Lack of Transportation (Non-Medical): No  Physical Activity: Not on file  Stress: Not on file  Social Connections: Not on file  Intimate Partner Violence: Not on file    Code Status   Code Status: Full Code  Review of Systems: All systems reviewed and negative except where noted in HPI.  Physical Exam: Vital signs in last 24 hours: Temp:  [97.4 F (36.3 C)-98.9 F (37.2 C)] 97.9 F (36.6 C) (07/15 0500) Pulse Rate:  [55-68] 59 (07/15 0600) Resp:  [16-20] 16 (07/15 0600) BP: (132-187)/(72-99) 140/78 (07/15 0600) SpO2:  [89 %-100 %] 98 % (07/15 0600) Weight:  [116.6 kg] 116.6 kg (07/14 2033) Last BM Date : 05/27/23  General:  Pleasant male in NAD Psych:  Cooperative. Normal mood and affect Eyes: Pupils equal Ears:  Normal auditory acuity Nose: No deformity, discharge or lesions Neck:  Supple, no masses felt Lungs:  Clear to auscultation.  Heart:  Regular rate, regular rhythm.  Abdomen:  Soft, nondistended, nontender, active bowel sounds, no masses felt Rectal :  Deferred Msk: Symmetrical without gross deformities.  Neurologic:  Alert, oriented, grossly normal neurologically Extremities : No edema Skin:  Intact without significant lesions.    Intake/Output from previous day: 07/14 0701 - 07/15 0700 In: 972.1 [IV Piggyback:972.1] Out: 300 [Urine:300] Intake/Output this shift:  No intake/output data recorded.     Willette Cluster, NP-C @  05/28/2023, 8:50 AM

## 2023-05-28 NOTE — Consult Note (Signed)
Consult Note  Alexander Howe 09-30-1946  [id number].    Requesting MD: Dorcas Carrow, MD Chief Complaint/Reason for Consult: possible choledocholithiasis  HPI:  Patient is a 77 year old male who presented with abdominal pain that started yesterday around lunch after coffee and crackers. Pain radiating to bilateral flanks and back and associated nausea and vomiting. No previous similar symptoms. Denies fevers. Workup shows Cholelithiasis without evidence of acute cholecystitis, a dilated CBD (upper limits normal) and transaminitis. Since admission his abdominal pain, n/v have improved/nearly resolved. He was admitted to [facility]. We were asked to see. GI has ordered an MRCP.  PMH otherwise significant for AAA, CKD stage III, HTN, HLD, melanoma of the skin, hx of rheumatic fever, sleep apnea, peripheral vascular disease, depression/anxiety. Prior abdominal surgery includes colostomy and colostomy reversal (~40 years ago for perforated diverticulitis). He is on ASA - otherwise blood thinners. Allergy to pneumococcal vaccines.   ROS: Negative other than HPI  History reviewed. No pertinent family history.  Past Medical History:  Diagnosis Date   AAA (abdominal aortic aneurysm) without rupture (HCC)    Abdominal aneurysm (HCC)    3.5 cm   Actinic keratosis 08/26/2020   right lateral chest   Anxiety    Basal cell carcinoma 09/18/2018   left upper back/one excised, one superficial   Basal cell carcinoma 11/28/2018   left post shoulder   Basal cell carcinoma 08/26/2020   left medial pretibial, EDC 10/2020   Basal cell carcinoma 03/15/2022   right neck, EDC   Basal cell carcinoma (BCC) 08/31/2022   Posterior base of neck superficial. 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 stones   Colon polyp    Depression    Diverticulitis    Diverticulosis    Hyperlipemia    Hypertension    Lipoma    Melanoma (HCC) 05/08/2018   in situ at right upper back/excision    Neoplasm    benign, colon   Onychomycosis    Osteoarthritis    Osteoarthritis    Rheumatic fever    Rheumatic fever    Sleep apnea    Squamous cell carcinoma of skin 10/13/2020   right lateral chest, EDC 12/09/20   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(HCC)    Venous stasis    Venous stasis    Vitamin B 12 deficiency     Past Surgical History:  Procedure Laterality Date   COLONOSCOPY W/ POLYPECTOMY     COLONOSCOPY WITH PROPOFOL N/A 04/05/2018   Procedure: COLONOSCOPY WITH PROPOFOL;  Surgeon: Scot Jun, MD;  Location: [facility] ENDOSCOPY;  Service: Endoscopy;  Laterality: N/A;   COLOSTOMY     COLOSTOMY REVERSAL     CYST REMOVAL LEG     KNEE ARTHROPLASTY Right 12/27/2015   Procedure: COMPUTER ASSISTED TOTAL KNEE ARTHROPLASTY;  Surgeon: Donato Heinz, MD;  Location: [facility] ORS;  Service: Orthopedics;  Laterality: Right;   KNEE ARTHROSCOPY Left    LIPOMA EXCISION     right thigh   LITHOTRIPSY     TOTAL KNEE ARTHROPLASTY Left 10/16/2012    Social History:  reports that he has quit smoking. He quit smokeless tobacco use about 32 years ago.  His smokeless tobacco use included chew. He reports current alcohol use. He reports that he does not use drugs.  Allergies:  Allergies  Allergen Reactions   Pneumococcal Vaccines Swelling    (Not in a hospital admission)   Blood pressure (!) 141/87, pulse Marland Kitchen)  58, temperature 98 F (36.7 C), temperature source Oral, resp. rate 18, height 6' 1.5" (1.867 m), weight 116.6 kg, SpO2 100%. Physical Exam:  General: pleasant, WD/WN male who is laying in bed in NAD HEENT: head is normocephalic, atraumatic.  Heart: regular, rate, and rhythm.   Lungs: CTAB, no wheezes, rhonchi, or rales noted.  Respiratory effort nonlabored Abd: Soft, ND, mild ttp in the RUQ, +BS. No masses, hernias, or organomegaly. Prior lower midline and colostomy scars are well healed.  MS: no BUE or BLE edema, calves soft and nontender Skin: warm and dry  Psych: A&Ox4 with an appropriate  affect Neuro: Normal speech, thought process intact, moves all extremities, gait not assessed  Results for orders placed or performed during the hospital encounter of 05/27/23 (from the past 48 hour(s))  Basic metabolic panel     Status: Abnormal   Collection Time: 05/27/23  8:43 PM  Result Value Ref Range   Sodium 138 135 - 145 mmol/L   Potassium 4.7 3.5 - 5.1 mmol/L   Chloride 96 (L) 98 - 111 mmol/L   CO2 30 22 - 32 mmol/L   Glucose, Bld 140 (H) 70 - 99 mg/dL    Comment: Glucose reference range applies only to samples taken after fasting for at least 8 hours.   BUN 37 (H) 8 - 23 mg/dL   Creatinine, Ser 1.61 (H) 0.61 - 1.24 mg/dL   Calcium 09.6 (H) 8.9 - 10.3 mg/dL   GFR, Estimated 33 (L) >60 mL/min    Comment: (NOTE) Calculated using the CKD-EPI Creatinine Equation (2021)    Anion gap 12 5 - 15    Comment: Performed at [facility], [address] [place], [place] [address]  CBC     Status: Abnormal   Collection Time: 05/27/23  8:43 PM  Result Value Ref Range   WBC 12.5 (H) 4.0 - 10.5 K/uL   RBC 5.23 4.22 - 5.81 MIL/uL   Hemoglobin 16.5 13.0 - 17.0 g/dL   HCT 98.1 19.1 - 47.8 %   MCV 96.9 80.0 - 100.0 fL   MCH 31.5 26.0 - 34.0 pg   MCHC 32.5 30.0 - 36.0 g/dL   RDW 29.5 62.1 - 30.8 %   Platelets 166 150 - 400 K/uL   nRBC 0.0 0.0 - 0.2 %    Comment: Performed at [facility], [address], [place], [place] [address]  Urinalysis, Routine w reflex microscopic -Urine, Clean Catch     Status: Abnormal   Collection Time: 05/27/23  8:44 PM  Result Value Ref Range   Color, Urine YELLOW YELLOW   APPearance CLEAR CLEAR   Specific Gravity, Urine 1.012 1.005 - 1.030   pH 7.0 5.0 - 8.0   Glucose, UA NEGATIVE NEGATIVE mg/dL   Hgb urine dipstick NEGATIVE NEGATIVE   Bilirubin Urine NEGATIVE NEGATIVE   Ketones, ur NEGATIVE NEGATIVE mg/dL   Protein, ur 30 (A) NEGATIVE mg/dL   Nitrite NEGATIVE NEGATIVE   Leukocytes,Ua NEGATIVE NEGATIVE   RBC / HPF 0-5 0 - 5 RBC/hpf    WBC, UA 0-5 0 - 5 WBC/hpf   Bacteria, UA NONE SEEN NONE SEEN   Squamous Epithelial / HPF 0-5 0 - 5 /HPF   Mucus PRESENT     Comment: Performed at [facility], [address], [place], [place] [address]  Hepatic function panel     Status: Abnormal   Collection Time: 05/27/23 10:47 PM  Result Value Ref Range  Total Protein 7.7 6.5 - 8.1 g/dL   Albumin 4.2 3.5 - 5.0 g/dL   AST 366 (H) 15 - 41 U/L   ALT 240 (H) 0 - 44 U/L   Alkaline Phosphatase 52 38 - 126 U/L   Total Bilirubin 5.7 (H) 0.3 - 1.2 mg/dL   Bilirubin, Direct 1.7 (H) 0.0 - 0.2 mg/dL   Indirect Bilirubin 4.0 (H) 0.3 - 0.9 mg/dL    Comment: Performed at [facility], [address], [place], [place] [address]  Lipase, blood     Status: None   Collection Time: 05/27/23 10:47 PM  Result Value Ref Range   Lipase 40 11 - 51 U/L    Comment: Performed at [facility], [address] [place], [place] [address]  Comprehensive metabolic panel     Status: Abnormal   Collection Time: 05/28/23  3:43 AM  Result Value Ref Range   Sodium 138 135 - 145 mmol/L   Potassium 5.1 3.5 - 5.1 mmol/L   Chloride 103 98 - 111 mmol/L   CO2 22 22 - 32 mmol/L   Glucose, Bld 121 (H) 70 - 99 mg/dL    Comment: Glucose reference range applies only to samples taken after fasting for at least 8 hours.   BUN 35 (H) 8 - 23 mg/dL   Creatinine, Ser 5.63 (H) 0.61 - 1.24 mg/dL   Calcium 9.8 8.9 - 87.5 mg/dL   Total Protein 6.8 6.5 - 8.1 g/dL   Albumin 3.5 3.5 - 5.0 g/dL   AST 643 (H) 15 - 41 U/L   ALT 282 (H) 0 - 44 U/L   Alkaline Phosphatase 53 38 - 126 U/L   Total Bilirubin 5.5 (H) 0.3 - 1.2 mg/dL   GFR, Estimated 36 (L) >60 mL/min    Comment: (NOTE) Calculated using the CKD-EPI Creatinine Equation (2021)    Anion gap 13 5 - 15    Comment: Performed at [facility], [address], [place], [place] [address]  CBC     Status: Abnormal   Collection Time: 05/28/23  3:43 AM  Result Value Ref Range   WBC 11.2 (H) 4.0 - 10.5 K/uL    RBC 5.17 4.22 - 5.81 MIL/uL   Hemoglobin 16.6 13.0 - 17.0 g/dL   HCT 88.4 16.6 - 06.3 %   MCV 98.1 80.0 - 100.0 fL   MCH 32.1 26.0 - 34.0 pg   MCHC 32.7 30.0 - 36.0 g/dL   RDW 01.6 01.0 - 93.2 %   Platelets 149 (L) 150 - 400 K/uL   nRBC 0.0 0.0 - 0.2 %    Comment: Performed at [facility], [address], [place], [place] [address]  Protime-INR     Status: None   Collection Time: 05/28/23  4:08 AM  Result Value Ref Range   Prothrombin Time 14.4 11.4 - 15.2 seconds   INR 1.1 0.8 - 1.2    Comment: (NOTE) INR goal varies based on device and disease states. Performed at [facility], [address] [place], [place] [address]    US Abdomen Limited RUQ (LIVER/GB)  Result Date: 05/28/2023 EXAM: ULTRASOUND ABDOMEN LIMITED RIGHT UPPER QUADRANT COMPARISON:  None Available. FINDINGS: Gallbladder: No wall thickening or pericholecystic fluid. Stones are noted within the gallbladder, which measure up to 1.3 cm. No sonographic Murphy sign noted by sonographer. Common bile duct: Diameter: 6 mm, at the upper limit of normal, although the biliary tree is quite difficult  to visualize. No intrahepatic biliary ductal dilatation is seen. Liver: Evaluation is limited by limited acoustic windows. Within this limitation, no focal lesion identified. Within normal limits in parenchymal echogenicity. Portal vein is patent on color Doppler imaging with normal direction of blood flow towards the liver. Other: None. IMPRESSION: 1. Cholelithiasis without evidence of acute cholecystitis. 2. Common bile duct measures at the upper limit of normal, although the biliary tree is quite difficult to visualize. No intrahepatic biliary ductal dilatation is seen. 3. Limited evaluation of the liver due to limited acoustic windows. Within this limitation, no focal lesion is seen. Electronically Signed   By: Wiliam Ke M.D.   On: 05/28/2023 01:54   CT Angio Chest/Abd/Pel for Dissection W and/or Wo Contrast  Result  Date: 05/28/2023 CLINICAL DATA:  Abdominal pain.  History of aortic aneurysm. EXAM: CT ANGIOGRAPHY CHEST, ABDOMEN AND PELVIS TECHNIQUE: Non-contrast CT of the chest was initially obtained. Multidetector CT imaging through the chest, abdomen and pelvis was performed using the standard protocol during bolus administration of intravenous contrast. Multiplanar reconstructed images and MIPs were obtained and reviewed to evaluate the vascular anatomy. RADIATION DOSE REDUCTION: This exam was performed according to the departmental dose-optimization program which includes automated exposure control, adjustment of the mA and/or kV according to patient size and/or use of iterative reconstruction technique. CONTRAST:  75mL OMNIPAQUE IOHEXOL 350 MG/ML SOLN COMPARISON:  CTA chest 07/26/2020; aortic ultrasound 08/25/2019 FINDINGS: CTA CHEST FINDINGS Cardiovascular: Normal heart size. No pericardial effusion. Aortic atherosclerotic calcification without dissection the ascending aorta is mildly dilated measuring 40 mm in diameter. Mediastinum/Nodes: Trachea and esophagus are unremarkable. No thoracic adenopathy. Lungs/Pleura: Scattered areas of scarring and bronchiolectasis, favored postinfectious given findings on CT 07/26/2020. No focal consolidation, pleural effusion, or pneumothorax. Musculoskeletal: No chest wall abnormality. No acute or significant osseous findings. Review of the MIP images confirms the above findings. CTA ABDOMEN AND PELVIS FINDINGS VASCULAR Aorta: Calcified atherosclerotic plaque without aneurysm or dissection. Celiac: Patent without aneurysm or dissection. SMA: Patent without aneurysm or dissection. Renals: Mild narrowing at the origins of the renal arteries bilaterally secondary to calcified plaque. No aneurysm or dissection. IMA: Patent. Inflow: Scattered calcified atherosclerotic plaque without significant narrowing. No aneurysm or dissection. Veins: No obvious venous abnormality within the limitations  of this arterial phase study. Review of the MIP images confirms the above findings. NON-VASCULAR Hepatobiliary: Hepatic cysts. Cholelithiasis. No evidence of cholecystitis. No biliary dilation. Pancreas: Ill-defined low-density lesion in the uncinate process of the pancreas (6/202) measuring 16 x 11 mm. Additional low-density lesions in the tail of the pancreas measuring 18 mm (6/181) and 14 mm (6/188). No evidence of acute pancreatitis. No pancreatic ductal dilation. Spleen: Unremarkable. Adrenals/Urinary Tract: Stable adrenal glands compared with 07/26/2020. Low-attenuation lesions in the kidneys are statistically likely to represent cysts. No follow-up is required. Four calculi are present within the bladder with the largest measuring 12 mm and located near the right UVJ. No hydronephrosis to suggest obstruction. Mild diffuse bladder wall thickening. Stomach/Bowel: Normal caliber large and small bowel. Postoperative change about the sigmoid colon. No bowel wall thickening. Normal appendix. Stomach is within normal limits. Lymphatic: No lymphadenopathy. Reproductive: Enlarged prostate. Other: No free intraperitoneal fluid or air. Musculoskeletal: No acute fracture. Review of the MIP images confirms the above findings. IMPRESSION: 1. No acute aortic syndrome. 2. Mildly dilated ascending aorta measuring 40 mm in diameter. Recommend annual imaging followup by CTA or MRA. This recommendation follows 2010 ACCF/AHA/AATS/ACR/ASA/SCA/SCAI/SIR/STS/SVM Guidelines for the Diagnosis and Management of Patients with  Thoracic Aortic Disease. Circulation. 2010; 121: [id number]. Aortic aneurysm NOS (ICD10-I71.9) 3. Multiple low-density lesions in the pancreas measuring up to 18 mm. Recommend further evaluation with nonemergent MRI with and without contrast. 4. Four calculi are present within the bladder with the largest measuring 12 mm and located near the right UVJ. No hydronephrosis to suggest obstruction. Aortic Atherosclerosis  (ICD10-I70.0). Electronically Signed   By: Minerva Fester M.D.   On: 05/28/2023 00:26    Anti-infectives (From admission, onward)    None        Assessment/Plan Cholelithiasis with transaminitis and hyperbilirubinemia  Possible choledocholithiasis  - RUQ [place] with CBD upper limit of normal, no evidence of acute cholecystitis  - CT with low density lesions in the pancreas - GI recommended MRI/MRCP - Tbili 5.5 from 5.7, AST/ALT 291/282 from 276/240; Alk phos and lipase normal  - WBC mildly elevated at 11.2, Afebrile - Agree with GI consult and MRCP - Surgery will follow as well  FEN: NPO, IVF per primary VTE: SQH ID: no current abx  Per [facility] Bladder calculi  Dilated ascending aorta CKD stage III HTN HLD Melanoma of the skin Hx of rheumatic fever OSA Peripheral vascular disease Depression/anxiety  I reviewed Consultant GI notes, hospitalist notes, last 24 h vitals and pain scores, last 48 h intake and output, last 24 h labs and trends, and last 24 h imaging results.   Leary Roca, [facility] Surgery 05/28/2023, 12:24 PM Please see Amion for pager number during day hours 7:00am-4:30pm

## 2023-05-28 NOTE — ED Notes (Signed)
ED TO INPATIENT HANDOFF REPORT  ED Nurse Name and Phone #: Tiarrah Saville, RN [phone]  S Name/Age/Gender Alexander Howe 77 y.o. male Room/Bed: 036C/036C  Code Status   Code Status: Full Code  Home/SNF/Other Home Patient oriented to: self, place, time, and situation Is this baseline? Yes   Triage Complete: Triage complete  Chief Complaint Elevated LFTs [R79.89]  Triage Note Pt c/o bilateral flank pain with N/V since 1200. Pt has hx of kidney stones.    Allergies Allergies  Allergen Reactions   Pneumococcal Vaccines Swelling    Level of Care/Admitting Diagnosis ED Disposition     ED Disposition  Admit   Condition  --   Comment  Hospital Area: [facility] [[id number]]  Level of Care: Med-Surg [16]  May admit patient to [facility] or [facility] if equivalent level of care is available:: Yes  Covid Evaluation: Asymptomatic - no recent exposure (last 10 days) testing not required  Diagnosis: Elevated LFTs [321910]  Admitting Physician: Briscoe Deutscher [[id number]]  Attending Physician: Briscoe Deutscher [[id number]]  Certification:: I certify this patient will need inpatient services for at least 2 midnights  Estimated Length of Stay: 3          B Medical/Surgery History Past Medical History:  Diagnosis Date   AAA (abdominal aortic aneurysm) without rupture (HCC)    Abdominal aneurysm (HCC)    3.5 cm   Actinic keratosis 08/26/2020   right lateral chest   Anxiety    Basal cell carcinoma 09/18/2018   left upper back/one excised, one superficial   Basal cell carcinoma 11/28/2018   left post shoulder   Basal cell carcinoma 08/26/2020   left medial pretibial, EDC 10/2020   Basal cell carcinoma 03/15/2022   right neck, EDC   Basal cell carcinoma (BCC) 08/31/2022   Posterior base of neck superficial. 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 stones   Colon polyp    Depression    Diverticulitis    Diverticulosis    Hyperlipemia     Hypertension    Lipoma    Melanoma (HCC) 05/08/2018   in situ at right upper back/excision   Neoplasm    benign, colon   Onychomycosis    Osteoarthritis    Osteoarthritis    Rheumatic fever    Rheumatic fever    Sleep apnea    Squamous cell carcinoma of skin 10/13/2020   right lateral chest, EDC 12/09/20   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(HCC)    Venous stasis    Venous stasis    Vitamin B 12 deficiency    Past Surgical History:  Procedure Laterality Date   COLONOSCOPY W/ POLYPECTOMY     COLONOSCOPY WITH PROPOFOL N/A 04/05/2018   Procedure: COLONOSCOPY WITH PROPOFOL;  Surgeon: Scot Jun, MD;  Location: [facility] ENDOSCOPY;  Service: Endoscopy;  Laterality: N/A;   COLOSTOMY     COLOSTOMY REVERSAL     CYST REMOVAL LEG     KNEE ARTHROPLASTY Right 12/27/2015   Procedure: COMPUTER ASSISTED TOTAL KNEE ARTHROPLASTY;  Surgeon: Donato Heinz, MD;  Location: [facility] ORS;  Service: Orthopedics;  Laterality: Right;   KNEE ARTHROSCOPY Left    LIPOMA EXCISION     right thigh   LITHOTRIPSY     TOTAL KNEE ARTHROPLASTY Left 10/16/2012     A IV Location/Drains/Wounds Patient Lines/Drains/Airways Status     Active Line/Drains/Airways     Name Placement date Placement time Site Days  Peripheral IV 05/27/23 18 G Anterior;Distal;Right;Upper Arm 05/27/23  2245  Arm  1   Airway 0 mm --  --  -- --   Incision (Closed) 12/27/15 Knee Right 12/27/15  0846  -- 2709            Intake/Output Last 24 hours  Intake/Output Summary (Last 24 hours) at 05/28/2023 1143 Last data filed at 05/28/2023 0505 Gross per 24 hour  Intake 972.11 ml  Output 300 ml  Net 672.11 ml    Labs/Imaging Results for orders placed or performed during the hospital encounter of 05/27/23 (from the past 48 hour(s))  Basic metabolic panel     Status: Abnormal   Collection Time: 05/27/23  8:43 PM  Result Value Ref Range   Sodium 138 135 - 145 mmol/L   Potassium 4.7 3.5 - 5.1 mmol/L   Chloride 96 (L) 98 - 111 mmol/L   CO2 30  22 - 32 mmol/L   Glucose, Bld 140 (H) 70 - 99 mg/dL    Comment: Glucose reference range applies only to samples taken after fasting for at least 8 hours.   BUN 37 (H) 8 - 23 mg/dL   Creatinine, Ser 0.10 (H) 0.61 - 1.24 mg/dL   Calcium 27.2 (H) 8.9 - 10.3 mg/dL   GFR, Estimated 33 (L) >60 mL/min    Comment: (NOTE) Calculated using the CKD-EPI Creatinine Equation (2021)    Anion gap 12 5 - 15    Comment: Performed at [facility], [address], [place], [place] [address]  CBC     Status: Abnormal   Collection Time: 05/27/23  8:43 PM  Result Value Ref Range   WBC 12.5 (H) 4.0 - 10.5 K/uL   RBC 5.23 4.22 - 5.81 MIL/uL   Hemoglobin 16.5 13.0 - 17.0 g/dL   HCT 40.3 47.4 - 25.9 %   MCV 96.9 80.0 - 100.0 fL   MCH 31.5 26.0 - 34.0 pg   MCHC 32.5 30.0 - 36.0 g/dL   RDW 56.3 87.5 - 64.3 %   Platelets 166 150 - 400 K/uL   nRBC 0.0 0.0 - 0.2 %    Comment: Performed at [facility], [address] [place], [place] [address]  Urinalysis, Routine w reflex microscopic -Urine, Clean Catch     Status: Abnormal   Collection Time: 05/27/23  8:44 PM  Result Value Ref Range   Color, Urine YELLOW YELLOW   APPearance CLEAR CLEAR   Specific Gravity, Urine 1.012 1.005 - 1.030   pH 7.0 5.0 - 8.0   Glucose, UA NEGATIVE NEGATIVE mg/dL   Hgb urine dipstick NEGATIVE NEGATIVE   Bilirubin Urine NEGATIVE NEGATIVE   Ketones, ur NEGATIVE NEGATIVE mg/dL   Protein, ur 30 (A) NEGATIVE mg/dL   Nitrite NEGATIVE NEGATIVE   Leukocytes,Ua NEGATIVE NEGATIVE   RBC / HPF 0-5 0 - 5 RBC/hpf   WBC, UA 0-5 0 - 5 WBC/hpf   Bacteria, UA NONE SEEN NONE SEEN   Squamous Epithelial / HPF 0-5 0 - 5 /HPF   Mucus PRESENT     Comment: Performed at [facility], [address] [place], [place] [address]  Hepatic function panel     Status: Abnormal   Collection Time: 05/27/23 10:47 PM  Result Value Ref Range   Total Protein 7.7 6.5 - 8.1 g/dL   Albumin 4.2 3.5 - 5.0 g/dL   AST 606 (H) 15 - 41 U/L   ALT 240  (H) 0 -  44 U/L   Alkaline Phosphatase 52 38 - 126 U/L   Total Bilirubin 5.7 (H) 0.3 - 1.2 mg/dL   Bilirubin, Direct 1.7 (H) 0.0 - 0.2 mg/dL   Indirect Bilirubin 4.0 (H) 0.3 - 0.9 mg/dL    Comment: Performed at [facility], [address], [place], [place] [address]  Lipase, blood     Status: None   Collection Time: 05/27/23 10:47 PM  Result Value Ref Range   Lipase 40 11 - 51 U/L    Comment: Performed at [facility], [address] [place], [place] [address]  Comprehensive metabolic panel     Status: Abnormal   Collection Time: 05/28/23  3:43 AM  Result Value Ref Range   Sodium 138 135 - 145 mmol/L   Potassium 5.1 3.5 - 5.1 mmol/L   Chloride 103 98 - 111 mmol/L   CO2 22 22 - 32 mmol/L   Glucose, Bld 121 (H) 70 - 99 mg/dL    Comment: Glucose reference range applies only to samples taken after fasting for at least 8 hours.   BUN 35 (H) 8 - 23 mg/dL   Creatinine, Ser 0.10 (H) 0.61 - 1.24 mg/dL   Calcium 9.8 8.9 - 27.2 mg/dL   Total Protein 6.8 6.5 - 8.1 g/dL   Albumin 3.5 3.5 - 5.0 g/dL   AST 536 (H) 15 - 41 U/L   ALT 282 (H) 0 - 44 U/L   Alkaline Phosphatase 53 38 - 126 U/L   Total Bilirubin 5.5 (H) 0.3 - 1.2 mg/dL   GFR, Estimated 36 (L) >60 mL/min    Comment: (NOTE) Calculated using the CKD-EPI Creatinine Equation (2021)    Anion gap 13 5 - 15    Comment: Performed at [facility], [address], [place], [place] [address]  CBC     Status: Abnormal   Collection Time: 05/28/23  3:43 AM  Result Value Ref Range   WBC 11.2 (H) 4.0 - 10.5 K/uL   RBC 5.17 4.22 - 5.81 MIL/uL   Hemoglobin 16.6 13.0 - 17.0 g/dL   HCT 47.4 25.9 - 56.3 %   MCV 98.1 80.0 - 100.0 fL   MCH 32.1 26.0 - 34.0 pg   MCHC 32.7 30.0 - 36.0 g/dL   RDW 87.5 64.3 - 32.9 %   Platelets 149 (L) 150 - 400 K/uL   nRBC 0.0 0.0 - 0.2 %    Comment: Performed at [facility], [address] [place], [place] [address]  Protime-INR     Status: None   Collection Time: 05/28/23  4:08 AM   Result Value Ref Range   Prothrombin Time 14.4 11.4 - 15.2 seconds   INR 1.1 0.8 - 1.2    Comment: (NOTE) INR goal varies based on device and disease states. Performed at [facility], [address], [place], [place] [address]    US Abdomen Limited RUQ (LIVER/GB)  Result Date: 05/28/2023 EXAM: ULTRASOUND ABDOMEN LIMITED RIGHT UPPER QUADRANT COMPARISON:  None Available. FINDINGS: Gallbladder: No wall thickening or pericholecystic fluid. Stones are noted within the gallbladder, which measure up to 1.3 cm. No sonographic Murphy sign noted by sonographer. Common bile duct: Diameter: 6 mm, at the upper limit of normal, although the biliary tree is quite difficult to visualize. No intrahepatic biliary ductal dilatation is seen. Liver: Evaluation is limited by limited acoustic windows. Within this limitation, no focal lesion identified. Within normal limits in parenchymal echogenicity. Portal  vein is patent on color Doppler imaging with normal direction of blood flow towards the liver. Other: None. IMPRESSION: 1. Cholelithiasis without evidence of acute cholecystitis. 2. Common bile duct measures at the upper limit of normal, although the biliary tree is quite difficult to visualize. No intrahepatic biliary ductal dilatation is seen. 3. Limited evaluation of the liver due to limited acoustic windows. Within this limitation, no focal lesion is seen. Electronically Signed   By: Wiliam Ke M.D.   On: 05/28/2023 01:54   CT Angio Chest/Abd/Pel for Dissection W and/or Wo Contrast  Result Date: 05/28/2023 CLINICAL DATA:  Abdominal pain.  History of aortic aneurysm. EXAM: CT ANGIOGRAPHY CHEST, ABDOMEN AND PELVIS TECHNIQUE: Non-contrast CT of the chest was initially obtained. Multidetector CT imaging through the chest, abdomen and pelvis was performed using the standard protocol during bolus administration of intravenous contrast. Multiplanar reconstructed images and MIPs were obtained and reviewed to  evaluate the vascular anatomy. RADIATION DOSE REDUCTION: This exam was performed according to the departmental dose-optimization program which includes automated exposure control, adjustment of the mA and/or kV according to patient size and/or use of iterative reconstruction technique. CONTRAST:  75mL OMNIPAQUE IOHEXOL 350 MG/ML SOLN COMPARISON:  CTA chest 07/26/2020; aortic ultrasound 08/25/2019 FINDINGS: CTA CHEST FINDINGS Cardiovascular: Normal heart size. No pericardial effusion. Aortic atherosclerotic calcification without dissection the ascending aorta is mildly dilated measuring 40 mm in diameter. Mediastinum/Nodes: Trachea and esophagus are unremarkable. No thoracic adenopathy. Lungs/Pleura: Scattered areas of scarring and bronchiolectasis, favored postinfectious given findings on CT 07/26/2020. No focal consolidation, pleural effusion, or pneumothorax. Musculoskeletal: No chest wall abnormality. No acute or significant osseous findings. Review of the MIP images confirms the above findings. CTA ABDOMEN AND PELVIS FINDINGS VASCULAR Aorta: Calcified atherosclerotic plaque without aneurysm or dissection. Celiac: Patent without aneurysm or dissection. SMA: Patent without aneurysm or dissection. Renals: Mild narrowing at the origins of the renal arteries bilaterally secondary to calcified plaque. No aneurysm or dissection. IMA: Patent. Inflow: Scattered calcified atherosclerotic plaque without significant narrowing. No aneurysm or dissection. Veins: No obvious venous abnormality within the limitations of this arterial phase study. Review of the MIP images confirms the above findings. NON-VASCULAR Hepatobiliary: Hepatic cysts. Cholelithiasis. No evidence of cholecystitis. No biliary dilation. Pancreas: Ill-defined low-density lesion in the uncinate process of the pancreas (6/202) measuring 16 x 11 mm. Additional low-density lesions in the tail of the pancreas measuring 18 mm (6/181) and 14 mm (6/188). No evidence  of acute pancreatitis. No pancreatic ductal dilation. Spleen: Unremarkable. Adrenals/Urinary Tract: Stable adrenal glands compared with 07/26/2020. Low-attenuation lesions in the kidneys are statistically likely to represent cysts. No follow-up is required. Four calculi are present within the bladder with the largest measuring 12 mm and located near the right UVJ. No hydronephrosis to suggest obstruction. Mild diffuse bladder wall thickening. Stomach/Bowel: Normal caliber large and small bowel. Postoperative change about the sigmoid colon. No bowel wall thickening. Normal appendix. Stomach is within normal limits. Lymphatic: No lymphadenopathy. Reproductive: Enlarged prostate. Other: No free intraperitoneal fluid or air. Musculoskeletal: No acute fracture. Review of the MIP images confirms the above findings. IMPRESSION: 1. No acute aortic syndrome. 2. Mildly dilated ascending aorta measuring 40 mm in diameter. Recommend annual imaging followup by CTA or MRA. This recommendation follows 2010 ACCF/AHA/AATS/ACR/ASA/SCA/SCAI/SIR/STS/SVM Guidelines for the Diagnosis and Management of Patients with Thoracic Aortic Disease. Circulation. 2010; 121: Z610-R604. Aortic aneurysm NOS (ICD10-I71.9) 3. Multiple low-density lesions in the pancreas measuring up to 18 mm. Recommend further evaluation with nonemergent MRI with and  without contrast. 4. Four calculi are present within the bladder with the largest measuring 12 mm and located near the right UVJ. No hydronephrosis to suggest obstruction. Aortic Atherosclerosis (ICD10-I70.0). Electronically Signed   By: Minerva Fester M.D.   On: 05/28/2023 00:26    Pending Labs Unresulted Labs (From admission, onward)     Start     Ordered   05/28/23 0500  Comprehensive metabolic panel  Daily,   R      05/28/23 0321   05/28/23 0500  CBC  Daily,   R      05/28/23 0321            Vitals/Pain Today's Vitals   05/28/23 0600 05/28/23 0626 05/28/23 0853 05/28/23 0900  BP:  (!) 140/78   (!) 110/54  Pulse: (!) 59   (!) 58  Resp: 16   18  Temp:   98 F (36.7 C)   TempSrc:   Oral   SpO2: 98%   99%  Weight:      Height:      PainSc:  1       Isolation Precautions No active isolations  Medications Medications  metoprolol succinate (TOPROL-XL) 24 hr tablet 100 mg (has no administration in time range)  ALPRAZolam (XANAX) tablet 0.5 mg (has no administration in time range)  PARoxetine (PAXIL) tablet 10 mg (10 mg Oral Given 05/28/23 0925)  heparin injection 5,000 Units (5,000 Units Subcutaneous Given 05/28/23 0615)  sodium chloride flush (NS) 0.9 % injection 3 mL (3 mLs Intravenous Not Given 05/28/23 0928)  0.9 %  sodium chloride infusion ( Intravenous New Bag/Given 05/28/23 0441)  acetaminophen (TYLENOL) tablet 325 mg (has no administration in time range)  ondansetron (ZOFRAN) tablet 4 mg (has no administration in time range)    Or  ondansetron (ZOFRAN) injection 4 mg (has no administration in time range)  fentaNYL (SUBLIMAZE) injection 12.5-50 mcg (has no administration in time range)  morphine (PF) 4 MG/ML injection 4 mg (4 mg Intravenous Given 05/27/23 2247)  iohexol (OMNIPAQUE) 350 MG/ML injection 75 mL (75 mLs Intravenous Contrast Given 05/27/23 2353)  ondansetron (ZOFRAN) injection 4 mg (4 mg Intravenous Given 05/28/23 0102)  morphine (PF) 2 MG/ML injection 2 mg (2 mg Intravenous Given 05/28/23 0102)  sodium chloride 0.9 % bolus 1,000 mL (0 mLs Intravenous Stopped 05/28/23 0429)    Mobility walks     Focused Assessments    R Recommendations: See Admitting Provider Note  Report given to:   Additional Notes: Supposed ERCP today, patient has both gallstones and kidney stones, taking the gallbladder tomorrow maybe

## 2023-05-28 NOTE — H&P (Signed)
History and Physical    Alexander Howe ZOX:[id number] DOB: [birth year] DOA: 05/27/2023  PCP: [name], MD   Patient coming from: Home   Chief Complaint: Abdominal pain, N/V   HPI: Alexander Howe is a pleasant 77 y.o. male with medical history significant for depression, anxiety, AAA and descending aortic aneurysm, hypertension, hyperlipidemia, CKD 3B, OSA, and nephrolithiasis who presents emergency department with abdominal pain, nausea, and vomiting.  Patient reports that he was in his usual state yesterday until around noon, shortly after eating, when he developed pain across the upper abdomen and nausea with vomiting.  Symptoms seem to come in waves with severe pain, nausea, and vomiting roughly every 30 minutes.  Vomit became bilious.  Pain began radiating through to his back.  There was no associated fever or chills and he has not experienced this previously.  ED Course: Upon arrival to the ED, patient is found to be afebrile and saturating well on room air with normal heart rate and elevated blood pressure.  Labs are notable for WBC 12,500, creatinine 2.03, AST 276, ALT 240, and total bilirubin 5.7.  CT notable for calculi in the urinary bladder, no hydronephrosis, mildly dilated ascending aorta, and multiple low-density pancreatic lesions.  Right upper quadrant ultrasound reveals cholelithiasis without evidence for acute cholecystitis, and CBD diameter at the upper limit of normal.  Biliary tree and liver were poorly visualized on the ultrasound.  Patient was given a liter of normal saline, 2 doses of morphine, and Zofran in the ED.  Review of Systems:  All other systems reviewed and apart from HPI, are negative.  Past Medical History:  Diagnosis Date   AAA (abdominal aortic aneurysm) without rupture (HCC)    Abdominal aneurysm (HCC)    3.5 cm   Actinic keratosis 08/26/2020   right lateral chest   Anxiety    Basal cell carcinoma 09/18/2018   left upper back/one excised,  one superficial   Basal cell carcinoma 11/28/2018   left post shoulder   Basal cell carcinoma 08/26/2020   left medial pretibial, EDC 10/2020   Basal cell carcinoma 03/15/2022   right neck, EDC   Basal cell carcinoma (BCC) 08/31/2022   Posterior base of neck superficial. 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   right lateral chest, EDC 12/09/20   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(HCC)    Venous stasis    Venous stasis    Vitamin B 12 deficiency     Past Surgical History:  Procedure Laterality Date   COLONOSCOPY W/ POLYPECTOMY     COLONOSCOPY WITH PROPOFOL N/A 04/05/2018   Procedure: COLONOSCOPY WITH PROPOFOL;  Surgeon: Scot Jun, MD;  Location: [facility]COPY;  Service: Endoscopy;  Laterality: N/A;   COLOSTOMY     COLOSTOMY REVERSAL     CYST REMOVAL LEG     KNEE ARTHROPLASTY Right 12/27/2015   Procedure: COMPUTER ASSISTED TOTAL KNEE ARTHROPLASTY;  Surgeon: Donato Heinz, MD;  Location:  [facility] ORS;  Service: Orthopedics;  Laterality: Right;   KNEE ARTHROSCOPY Left    LIPOMA EXCISION     right thigh   LITHOTRIPSY     TOTAL KNEE ARTHROPLASTY Left 10/16/2012    Social History:   reports that he has quit smoking. He quit smokeless tobacco use about 32 years ago.  His smokeless tobacco use included chew. He reports current alcohol use. He reports that he does not use drugs.  Allergies  Allergen Reactions   Pneumococcal Vaccines Swelling    History reviewed. No pertinent family history.   Prior to Admission medications   Medication Sig Start Date End Date Taking? Authorizing Provider   albuterol (VENTOLIN HFA) 108 (90 Base) MCG/ACT inhaler SMARTSIG:2 Inhalation Via Inhaler Every 6 Hours PRN 07/29/20   [provider]  ALPRAZolam Prudy Feeler) 0.5 MG tablet Take 0.5 mg by mouth 2 (two) times daily as needed for anxiety.    [provider]  amLODipine (NORVASC) 5 MG tablet Take 5 mg by mouth daily.    [provider]  aspirin EC 81 MG tablet Take 81 mg by mouth daily.    [provider]  atorvastatin (LIPITOR) 80 MG tablet Take 80 mg by mouth daily.    [provider]  buPROPion (ZYBAN) 150 MG 12 hr tablet Take 150 mg by mouth 2 (two) times daily. Patient not taking: Reported on 09/24/2020    [provider]  Cholecalciferol (VITAMIN D3) 2000 units TABS Take 1 tablet by mouth daily.    [provider]  docusate sodium (COLACE) 100 MG capsule Take 200 mg by mouth daily. Patient not taking: Reported on 09/24/2020    [provider]  enoxaparin (LOVENOX) 40 MG/0.4ML injection Inject 0.4 mLs (40 mg total) into the skin daily. Patient not taking: Reported on 09/24/2020 12/29/15   Tera Partridge, PA  furosemide (LASIX) 20 MG tablet Take 20 mg by mouth daily as needed. Patient not taking: Reported on 09/24/2020    [provider]  hydrochlorothiazide (HYDRODIURIL) 25 MG tablet Take 25 mg by mouth daily.    [provider]  magnesium oxide (MAG-OX) 400 MG tablet Take 400 mg by mouth daily. Patient not taking: Reported on 10/13/2020    [provider]  metoprolol succinate (TOPROL-XL) 100 MG 24 hr tablet Take 100 mg by mouth at bedtime. Take with or immediately following a meal.    [provider]  Omega-3 Fatty Acids (FISH OIL CONCENTRATE PO) Take 1 capsule by mouth daily.    [provider]  oxyCODONE (OXY IR/ROXICODONE) 5 MG immediate release tablet Take 1-2 tablets (5-10 mg total) by mouth every 4 (four) hours as needed for severe pain or breakthrough pain. Patient not taking:  Reported on 09/24/2020 12/29/15   Tera Partridge, PA  pantoprazole (PROTONIX) 40 MG tablet Take 40 mg by mouth daily.    [provider]  PARoxetine (PAXIL) 10 MG tablet Take 10 mg by mouth daily.    [provider]  potassium chloride SA (K-DUR,KLOR-CON) 20 MEQ tablet Take 20 mEq by mouth daily.    [provider]  predniSONE (DELTASONE) 20 MG tablet Take 20 mg by mouth daily. Patient not taking: Reported on 10/20/2020 07/26/20   [provider]  quinapril (ACCUPRIL) 40 MG tablet Take 40 mg by mouth daily.    [provider]  sulfamethoxazole-trimethoprim (BACTRIM) 400-80 MG tablet SMARTSIG:1 Tablet(s) By Mouth Every 12 Hours Patient not taking: No sig reported 07/30/20  [provider]  tamsulosin (FLOMAX) 0.4 MG CAPS capsule Take 0.4 mg by mouth daily. Patient not taking: Reported on 10/20/2020 08/13/20   [provider]  traMADol (ULTRAM) 50 MG tablet Take 1-2 tablets (50-100 mg total) by mouth every 4 (four) hours as needed for moderate pain. Patient not taking: Reported on 09/24/2020 12/29/15   Tera Partridge, PA  triamcinolone (KENALOG) 0.025 % ointment Apply 1 Application topically 2 (two) times daily. As needed for up to 5 days to face, 2 weeks to scalp 03/22/23   [place], [place], MD  triamcinolone ointment (KENALOG) 0.1 % Apply twice daily to scalp for 1 week if needed after treatment 09/28/22   Sandi Mealy, MD    Physical Exam: Vitals:   05/28/23 0104 05/28/23 0200 05/28/23 0230 05/28/23 0300  BP: (!) 148/76 (!) 136/99 (!) 152/98 138/77  Pulse: 63 60 62 60  Resp: 16     Temp: 98.9 F (37.2 C)     TempSrc: Oral     SpO2: 97% 93% 98% 94%  Weight:      Height:        Constitutional: NAD, calm  Eyes: PERTLA, lids and conjunctivae normal ENMT: Mucous membranes are moist. Posterior pharynx clear of any exudate or lesions.   Neck: supple, no masses  Respiratory: no wheezing, no crackles. No accessory muscle use.   Cardiovascular: S1 & S2 heard, regular rate and rhythm. No extremity edema.   Abdomen: No distension, soft, generally tender without rebound pain or guarding. Bowel sounds active.  Musculoskeletal: no clubbing / cyanosis. No joint deformity upper and lower extremities.   Skin: no significant rashes, lesions, ulcers. Warm, dry, well-perfused. Neurologic: CN 2-12 grossly intact. Moving all extremities. Alert and oriented.  Psychiatric: Pleasant. Cooperative.    Labs and Imaging on Admission: I have personally reviewed following labs and imaging studies  CBC: Recent Labs  Lab 05/27/23 2043  WBC 12.5*  HGB 16.5  HCT 50.7  MCV 96.9  PLT 166   Basic Metabolic Panel: Recent Labs  Lab 05/27/23 2043  NA 138  K 4.7  CL 96*  CO2 30  GLUCOSE 140*  BUN 37*  CREATININE 2.03*  CALCIUM 10.4*   GFR: Estimated Creatinine Clearance: 41.7 mL/min (A) (by C-G formula based on SCr of 2.03 mg/dL (H)). Liver Function Tests: Recent Labs  Lab 05/27/23 2247  AST 276*  ALT 240*  ALKPHOS 52  BILITOT 5.7*  PROT 7.7  ALBUMIN 4.2   Recent Labs  Lab 05/27/23 2247  LIPASE 40   No results for input(s): "AMMONIA" in the last 168 hours. Coagulation Profile: No results for input(s): "INR", "PROTIME" in the last 168 hours. Cardiac Enzymes: No results for input(s): "CKTOTAL", "CKMB", "CKMBINDEX", "TROPONINI" in the last 168 hours. BNP (last 3 results) No results for input(s): "PROBNP" in the last 8760 hours. HbA1C: No results for input(s): "HGBA1C" in the last 72 hours. CBG: No results for input(s): "GLUCAP" in the last 168 hours. Lipid Profile: No results for input(s): "CHOL", "HDL", "LDLCALC", "TRIG", "CHOLHDL", "LDLDIRECT" in the last 72 hours. Thyroid Function Tests: No results for input(s): "TSH", "T4TOTAL", "FREET4", "T3FREE", "THYROIDAB" in the last 72 hours. Anemia Panel: No results for input(s): "VITAMINB12", "FOLATE", "FERRITIN", "TIBC", "IRON", "RETICCTPCT" in the last 72  hours. Urine analysis:    Component Value Date/Time   COLORURINE YELLOW 05/27/2023 2044   APPEARANCEUR CLEAR 05/27/2023 2044   APPEARANCEUR Hazy 09/30/2012 0956   LABSPEC 1.012 05/27/2023 2044   LABSPEC 1.019 09/30/2012 3875  PHURINE 7.0 05/27/2023 2044   GLUCOSEU NEGATIVE 05/27/2023 2044   GLUCOSEU Negative 09/30/2012 0956   HGBUR NEGATIVE 05/27/2023 2044   BILIRUBINUR NEGATIVE 05/27/2023 2044   BILIRUBINUR 1+ 09/30/2012 0956   KETONESUR NEGATIVE 05/27/2023 2044   PROTEINUR 30 (A) 05/27/2023 2044   NITRITE NEGATIVE 05/27/2023 2044   LEUKOCYTESUR NEGATIVE 05/27/2023 2044   LEUKOCYTESUR Negative 09/30/2012 0956   Sepsis Labs: @LABRCNTIP (procalcitonin:4,lacticidven:4) )No results found for this or any previous visit (from the past 240 hour(s)).   Radiological Exams on Admission: US Abdomen Limited RUQ (LIVER/GB)  Result Date: 05/28/2023 EXAM: ULTRASOUND ABDOMEN LIMITED RIGHT UPPER QUADRANT COMPARISON:  None Available. FINDINGS: Gallbladder: No wall thickening or pericholecystic fluid. Stones are noted within the gallbladder, which measure up to 1.3 cm. No sonographic Murphy sign noted by sonographer. Common bile duct: Diameter: 6 mm, at the upper limit of normal, although the biliary tree is quite difficult to visualize. No intrahepatic biliary ductal dilatation is seen. Liver: Evaluation is limited by limited acoustic windows. Within this limitation, no focal lesion identified. Within normal limits in parenchymal echogenicity. Portal vein is patent on color Doppler imaging with normal direction of blood flow towards the liver. Other: None. IMPRESSION: 1. Cholelithiasis without evidence of acute cholecystitis. 2. Common bile duct measures at the upper limit of normal, although the biliary tree is quite difficult to visualize. No intrahepatic biliary ductal dilatation is seen. 3. Limited evaluation of the liver due to limited acoustic windows. Within this limitation, no focal lesion is  seen. Electronically Signed   By: Wiliam Ke M.D.   On: 05/28/2023 01:54   CT Angio Chest/Abd/Pel for Dissection W and/or Wo Contrast  Result Date: 05/28/2023 CLINICAL DATA:  Abdominal pain.  History of aortic aneurysm. EXAM: CT ANGIOGRAPHY CHEST, ABDOMEN AND PELVIS TECHNIQUE: Non-contrast CT of the chest was initially obtained. Multidetector CT imaging through the chest, abdomen and pelvis was performed using the standard protocol during bolus administration of intravenous contrast. Multiplanar reconstructed images and MIPs were obtained and reviewed to evaluate the vascular anatomy. RADIATION DOSE REDUCTION: This exam was performed according to the departmental dose-optimization program which includes automated exposure control, adjustment of the mA and/or kV according to patient size and/or use of iterative reconstruction technique. CONTRAST:  75mL OMNIPAQUE IOHEXOL 350 MG/ML SOLN COMPARISON:  CTA chest 07/26/2020; aortic ultrasound 08/25/2019 FINDINGS: CTA CHEST FINDINGS Cardiovascular: Normal heart size. No pericardial effusion. Aortic atherosclerotic calcification without dissection the ascending aorta is mildly dilated measuring 40 mm in diameter. Mediastinum/Nodes: Trachea and esophagus are unremarkable. No thoracic adenopathy. Lungs/Pleura: Scattered areas of scarring and bronchiolectasis, favored postinfectious given findings on CT 07/26/2020. No focal consolidation, pleural effusion, or pneumothorax. Musculoskeletal: No chest wall abnormality. No acute or significant osseous findings. Review of the MIP images confirms the above findings. CTA ABDOMEN AND PELVIS FINDINGS VASCULAR Aorta: Calcified atherosclerotic plaque without aneurysm or dissection. Celiac: Patent without aneurysm or dissection. SMA: Patent without aneurysm or dissection. Renals: Mild narrowing at the origins of the renal arteries bilaterally secondary to calcified plaque. No aneurysm or dissection. IMA: Patent. Inflow: Scattered  calcified atherosclerotic plaque without significant narrowing. No aneurysm or dissection. Veins: No obvious venous abnormality within the limitations of this arterial phase study. Review of the MIP images confirms the above findings. NON-VASCULAR Hepatobiliary: Hepatic cysts. Cholelithiasis. No evidence of cholecystitis. No biliary dilation. Pancreas: Ill-defined low-density lesion in the uncinate process of the pancreas (6/202) measuring 16 x 11 mm. Additional low-density lesions in the tail of the pancreas measuring 18 mm (  6/181) and 14 mm (6/188). No evidence of acute pancreatitis. No pancreatic ductal dilation. Spleen: Unremarkable. Adrenals/Urinary Tract: Stable adrenal glands compared with 07/26/2020. Low-attenuation lesions in the kidneys are statistically likely to represent cysts. No follow-up is required. Four calculi are present within the bladder with the largest measuring 12 mm and located near the right UVJ. No hydronephrosis to suggest obstruction. Mild diffuse bladder wall thickening. Stomach/Bowel: Normal caliber large and small bowel. Postoperative change about the sigmoid colon. No bowel wall thickening. Normal appendix. Stomach is within normal limits. Lymphatic: No lymphadenopathy. Reproductive: Enlarged prostate. Other: No free intraperitoneal fluid or air. Musculoskeletal: No acute fracture. Review of the MIP images confirms the above findings. IMPRESSION: 1. No acute aortic syndrome. 2. Mildly dilated ascending aorta measuring 40 mm in diameter. Recommend annual imaging followup by CTA or MRA. This recommendation follows 2010 ACCF/AHA/AATS/ACR/ASA/SCA/SCAI/SIR/STS/SVM Guidelines for the Diagnosis and Management of Patients with Thoracic Aortic Disease. Circulation. 2010; 121: X528-U132. Aortic aneurysm NOS (ICD10-I71.9) 3. Multiple low-density lesions in the pancreas measuring up to 18 mm. Recommend further evaluation with nonemergent MRI with and without contrast. 4. Four calculi are  present within the bladder with the largest measuring 12 mm and located near the right UVJ. No hydronephrosis to suggest obstruction. Aortic Atherosclerosis (ICD10-I70.0). Electronically Signed   By: Minerva Fester M.D.   On: 05/28/2023 00:26    Assessment/Plan   1. Elevated LFTs  - P/w with abdominal pain and N/V and found to have elevated LFTs in hepatocellular pattern  - Cholelithiasis noted on imaging with CBD diameter upper limit of normal and poorly visualized biliary tree on [place]  - D/t severe claustrophobia, he does not want to attempt MRCP with sedation  - Sxs are nearly resolved by time of admission and may be d/t passed stone, will hold Lipitor for now and trend labs    2. Depression, anxiety  - Continue Paxil and Xanax   3. Hypertension  - Continue metoprolol    4. CKD 3B  - SCr is 2.03 on admission, up from baseline of ~1.7 in setting of N/V  - He was given 1 liter NS in ED  - Hold diuretic, renally-dose medications, repeat chem panel in am    5. OSA  - CPAP at bedtime    DVT prophylaxis: sq heparin  Code Status: Full  Level of Care: Level of care: Med-Surg Family Communication: none present  Disposition Plan:  Patient is from: home  Anticipated d/c is to: Home  Anticipated d/c date is: 05/30/23  Patient currently: Pending symptom-control, trend in labs, tolerance of adequate oral intake  Consults called: None  Admission status: Inpatient     Briscoe Deutscher, MD [facility]  05/28/2023, 3:57 AM

## 2023-05-28 NOTE — Progress Notes (Signed)
   05/28/23 2156  BiPAP/CPAP/SIPAP  $ Non-Invasive Home Ventilator  Initial  $ Face Mask Large  Yes  BiPAP/CPAP/SIPAP Pt Type Adult  BiPAP/CPAP/SIPAP DREAMSTATIOND  Mask Type Full face mask  Mask Size Large  Respiratory Rate 16 breaths/min  IPAP  (Auto 15/5)  Flow Rate 2 lpm  Patient Home Equipment No  Auto Titrate Yes  BiPAP/CPAP /SiPAP Vitals  Pulse Rate 60  Resp 16  SpO2 99 %  Bilateral Breath Sounds Clear  MEWS Score/Color  MEWS Score 0  MEWS Score Color Alexander Howe

## 2023-05-28 NOTE — Progress Notes (Signed)
PROGRESS NOTE    Alexander Howe  ZOX:[id number] DOB: [birth year] DOA: 05/27/2023 PCP: [name], MD    Brief Narrative:  77 year old with history of depression, anxiety, AAA and descending aortic aneurysm, hypertension, hyperlipidemia, CKD stage IIIb, obstructive sleep apnea and history of nephrolithiasis presented with abdominal pain, nausea and vomiting for 1 day.  Shortly started after eating.  In the emergency room he was afebrile.  WC count 12.5.  Creatinine 2.03.  Mildly elevated transaminases.  Total bilirubin 5.7, direct bilirubin 1.8.  CT scan notable for calculi of the urinary bladder without obstruction, multiple low-density pancreatic lesions, cholelithiasis without acute cholecystitis with normal CBD diameter.  Admitted with IV fluids, pain medications and nausea medications.   Assessment & Plan:   Acute upper quadrant abdominal pain Symptomatic cholelithiasis, possibly passed bile duct stone Abnormal liver function test, indirect hyperbilirubinemia Abnormal pancreatic morphology on CT scan.  Currently hemodynamically stable.  Pain is controlled.  Symptoms are controlled. Continue maintenance IV fluids.  Adequate pain medications.  Currently no indication for antibiotics. Patient was offered MRCP both to look at CBD/liver morphology and pancreatic morphology.  He declined with me but apparently now agreeable to do MRCP/MRI with anesthesia as ordered by gastroenterology.  Depends upon findings, may need further biopsies. Discussed case with surgery for possible cholecystectomy. Rule out pancreatic malignancy with MRI.  Renal calculi/urinary bladder calculi: Patient has multiple urinary bladder calculi however without urinary symptoms.  No hydronephrosis or obstruction.  Monitor for symptoms.  Chronic medical issues including Anxiety and depression: On Paxil and Xanax Essential hypertension: On metoprolol.  Holding diuretics and ACE. Acute kidney injury on CKD stage  IIIb: Baseline about 1.7.  Maintenance fluid.  Recheck levels. Obstructive sleep apnea: To use CPAP at night.   DVT prophylaxis: heparin injection 5,000 Units Start: 05/28/23 0600   Code Status: Full code Family Communication: None at the bedside Disposition Plan: Status is: Inpatient Remains inpatient appropriate because: Inpatient procedures planned     Consultants:  Gastroenterology Surgery  Procedures:  None  Antimicrobials:  None   Subjective: Patient seen in the morning rounds.  He was still in the emergency room.  Poor historian.  His pain has improved.  He was very hesitant for any exams or surgery.  He was hesitant for MRI but later agreed with anesthesia.  Objective: Vitals:   05/28/23 0853 05/28/23 0900 05/28/23 1201 05/28/23 1250  BP:  (!) 110/54 (!) 141/87   Pulse:  (!) 58 (!) 58   Resp:  18 18   Temp: 98 F (36.7 C)   98.1 F (36.7 C)  TempSrc: Oral   Oral  SpO2:  99% 100%   Weight:      Height:        Intake/Output Summary (Last 24 hours) at 05/28/2023 1427 Last data filed at 05/28/2023 0505 Gross per 24 hour  Intake 972.11 ml  Output 300 ml  Net 672.11 ml   Filed Weights   05/27/23 2033  Weight: 116.6 kg    Examination:  General exam: Appears calm and comfortable  Appropriate anxious.  Alert awake and oriented.  No focal neurological deficits. Respiratory system: Clear to auscultation. Respiratory effort normal. Cardiovascular system: S1 & S2 heard, RRR. No pedal edema. Gastrointestinal system: Soft and nontender.  Bowel sound present. Central nervous system: Alert and oriented. No focal neurological deficits. Extremities: Symmetric 5 x 5 power. Skin: No rashes, lesions or ulcers    Data Reviewed: I have personally reviewed following labs  and imaging studies  CBC: Recent Labs  Lab 05/27/23 2043 05/28/23 0343  WBC 12.5* 11.2*  HGB 16.5 16.6  HCT 50.7 50.7  MCV 96.9 98.1  PLT 166 149*   Basic Metabolic Panel: Recent Labs   Lab 05/27/23 2043 05/28/23 0343  NA 138 138  K 4.7 5.1  CL 96* 103  CO2 30 22  GLUCOSE 140* 121*  BUN 37* 35*  CREATININE 2.03* 1.91*  CALCIUM 10.4* 9.8   GFR: Estimated Creatinine Clearance: 44.4 mL/min (A) (by C-G formula based on SCr of 1.91 mg/dL (H)). Liver Function Tests: Recent Labs  Lab 05/27/23 2247 05/28/23 0343  AST 276* 291*  ALT 240* 282*  ALKPHOS 52 53  BILITOT 5.7* 5.5*  PROT 7.7 6.8  ALBUMIN 4.2 3.5   Recent Labs  Lab 05/27/23 2247  LIPASE 40   No results for input(s): "AMMONIA" in the last 168 hours. Coagulation Profile: Recent Labs  Lab 05/28/23 0408  INR 1.1   Cardiac Enzymes: No results for input(s): "CKTOTAL", "CKMB", "CKMBINDEX", "TROPONINI" in the last 168 hours. BNP (last 3 results) No results for input(s): "PROBNP" in the last 8760 hours. HbA1C: No results for input(s): "HGBA1C" in the last 72 hours. CBG: No results for input(s): "GLUCAP" in the last 168 hours. Lipid Profile: No results for input(s): "CHOL", "HDL", "LDLCALC", "TRIG", "CHOLHDL", "LDLDIRECT" in the last 72 hours. Thyroid Function Tests: No results for input(s): "TSH", "T4TOTAL", "FREET4", "T3FREE", "THYROIDAB" in the last 72 hours. Anemia Panel: No results for input(s): "VITAMINB12", "FOLATE", "FERRITIN", "TIBC", "IRON", "RETICCTPCT" in the last 72 hours. Sepsis Labs: No results for input(s): "PROCALCITON", "LATICACIDVEN" in the last 168 hours.  No results found for this or any previous visit (from the past 240 hour(s)).       Radiology Studies: US Abdomen Limited RUQ (LIVER/GB)  Result Date: 05/28/2023 EXAM: ULTRASOUND ABDOMEN LIMITED RIGHT UPPER QUADRANT COMPARISON:  None Available. FINDINGS: Gallbladder: No wall thickening or pericholecystic fluid. Stones are noted within the gallbladder, which measure up to 1.3 cm. No sonographic Murphy sign noted by sonographer. Common bile duct: Diameter: 6 mm, at the upper limit of normal, although the biliary tree is  quite difficult to visualize. No intrahepatic biliary ductal dilatation is seen. Liver: Evaluation is limited by limited acoustic windows. Within this limitation, no focal lesion identified. Within normal limits in parenchymal echogenicity. Portal vein is patent on color Doppler imaging with normal direction of blood flow towards the liver. Other: None. IMPRESSION: 1. Cholelithiasis without evidence of acute cholecystitis. 2. Common bile duct measures at the upper limit of normal, although the biliary tree is quite difficult to visualize. No intrahepatic biliary ductal dilatation is seen. 3. Limited evaluation of the liver due to limited acoustic windows. Within this limitation, no focal lesion is seen. Electronically Signed   By: Wiliam Ke M.D.   On: 05/28/2023 01:54   CT Angio Chest/Abd/Pel for Dissection W and/or Wo Contrast  Result Date: 05/28/2023 CLINICAL DATA:  Abdominal pain.  History of aortic aneurysm. EXAM: CT ANGIOGRAPHY CHEST, ABDOMEN AND PELVIS TECHNIQUE: Non-contrast CT of the chest was initially obtained. Multidetector CT imaging through the chest, abdomen and pelvis was performed using the standard protocol during bolus administration of intravenous contrast. Multiplanar reconstructed images and MIPs were obtained and reviewed to evaluate the vascular anatomy. RADIATION DOSE REDUCTION: This exam was performed according to the departmental dose-optimization program which includes automated exposure control, adjustment of the mA and/or kV according to patient size and/or use of iterative reconstruction  technique. CONTRAST:  75mL OMNIPAQUE IOHEXOL 350 MG/ML SOLN COMPARISON:  CTA chest 07/26/2020; aortic ultrasound 08/25/2019 FINDINGS: CTA CHEST FINDINGS Cardiovascular: Normal heart size. No pericardial effusion. Aortic atherosclerotic calcification without dissection the ascending aorta is mildly dilated measuring 40 mm in diameter. Mediastinum/Nodes: Trachea and esophagus are unremarkable. No  thoracic adenopathy. Lungs/Pleura: Scattered areas of scarring and bronchiolectasis, favored postinfectious given findings on CT 07/26/2020. No focal consolidation, pleural effusion, or pneumothorax. Musculoskeletal: No chest wall abnormality. No acute or significant osseous findings. Review of the MIP images confirms the above findings. CTA ABDOMEN AND PELVIS FINDINGS VASCULAR Aorta: Calcified atherosclerotic plaque without aneurysm or dissection. Celiac: Patent without aneurysm or dissection. SMA: Patent without aneurysm or dissection. Renals: Mild narrowing at the origins of the renal arteries bilaterally secondary to calcified plaque. No aneurysm or dissection. IMA: Patent. Inflow: Scattered calcified atherosclerotic plaque without significant narrowing. No aneurysm or dissection. Veins: No obvious venous abnormality within the limitations of this arterial phase study. Review of the MIP images confirms the above findings. NON-VASCULAR Hepatobiliary: Hepatic cysts. Cholelithiasis. No evidence of cholecystitis. No biliary dilation. Pancreas: Ill-defined low-density lesion in the uncinate process of the pancreas (6/202) measuring 16 x 11 mm. Additional low-density lesions in the tail of the pancreas measuring 18 mm (6/181) and 14 mm (6/188). No evidence of acute pancreatitis. No pancreatic ductal dilation. Spleen: Unremarkable. Adrenals/Urinary Tract: Stable adrenal glands compared with 07/26/2020. Low-attenuation lesions in the kidneys are statistically likely to represent cysts. No follow-up is required. Four calculi are present within the bladder with the largest measuring 12 mm and located near the right UVJ. No hydronephrosis to suggest obstruction. Mild diffuse bladder wall thickening. Stomach/Bowel: Normal caliber large and small bowel. Postoperative change about the sigmoid colon. No bowel wall thickening. Normal appendix. Stomach is within normal limits. Lymphatic: No lymphadenopathy. Reproductive:  Enlarged prostate. Other: No free intraperitoneal fluid or air. Musculoskeletal: No acute fracture. Review of the MIP images confirms the above findings. IMPRESSION: 1. No acute aortic syndrome. 2. Mildly dilated ascending aorta measuring 40 mm in diameter. Recommend annual imaging followup by CTA or MRA. This recommendation follows 2010 ACCF/AHA/AATS/ACR/ASA/SCA/SCAI/SIR/STS/SVM Guidelines for the Diagnosis and Management of Patients with Thoracic Aortic Disease. Circulation. 2010; 121: Z610-R604. Aortic aneurysm NOS (ICD10-I71.9) 3. Multiple low-density lesions in the pancreas measuring up to 18 mm. Recommend further evaluation with nonemergent MRI with and without contrast. 4. Four calculi are present within the bladder with the largest measuring 12 mm and located near the right UVJ. No hydronephrosis to suggest obstruction. Aortic Atherosclerosis (ICD10-I70.0). Electronically Signed   By: Minerva Fester M.D.   On: 05/28/2023 00:26        Scheduled Meds:  heparin  5,000 Units Subcutaneous Q8H   metoprolol succinate  100 mg Oral QHS   PARoxetine  10 mg Oral Daily   sodium chloride flush  3 mL Intravenous Q12H   Continuous Infusions:   LOS: 0 days    Time spent: 40 minutes.  Same-day admit.  No charge visit.    Dorcas Carrow, MD [facility]

## 2023-05-29 ENCOUNTER — Ambulatory Visit: Payer: Medicare HMO

## 2023-05-29 ENCOUNTER — Inpatient Hospital Stay (HOSPITAL_COMMUNITY): Payer: Medicare HMO

## 2023-05-29 DIAGNOSIS — N183 Chronic kidney disease, stage 3 unspecified: Secondary | ICD-10-CM | POA: Diagnosis not present

## 2023-05-29 DIAGNOSIS — R7989 Other specified abnormal findings of blood chemistry: Secondary | ICD-10-CM | POA: Diagnosis not present

## 2023-05-29 DIAGNOSIS — R1084 Generalized abdominal pain: Secondary | ICD-10-CM

## 2023-05-29 DIAGNOSIS — K8070 Calculus of gallbladder and bile duct without cholecystitis without obstruction: Secondary | ICD-10-CM

## 2023-05-29 DIAGNOSIS — F418 Other specified anxiety disorders: Secondary | ICD-10-CM

## 2023-05-29 DIAGNOSIS — R17 Unspecified jaundice: Secondary | ICD-10-CM

## 2023-05-29 DIAGNOSIS — I10 Essential (primary) hypertension: Secondary | ICD-10-CM | POA: Diagnosis not present

## 2023-05-29 DIAGNOSIS — D490 Neoplasm of unspecified behavior of digestive system: Secondary | ICD-10-CM

## 2023-05-29 LAB — COMPREHENSIVE METABOLIC PANEL
ALT: 251 U/L — ABNORMAL HIGH (ref 0–44)
AST: 182 U/L — ABNORMAL HIGH (ref 15–41)
Albumin: 3.3 g/dL — ABNORMAL LOW (ref 3.5–5.0)
Alkaline Phosphatase: 54 U/L (ref 38–126)
Anion gap: 8 (ref 5–15)
BUN: 32 mg/dL — ABNORMAL HIGH (ref 8–23)
CO2: 26 mmol/L (ref 22–32)
Calcium: 9.4 mg/dL (ref 8.9–10.3)
Chloride: 102 mmol/L (ref 98–111)
Creatinine, Ser: 1.61 mg/dL — ABNORMAL HIGH (ref 0.61–1.24)
GFR, Estimated: 44 mL/min — ABNORMAL LOW (ref >60)
Glucose, Bld: 101 mg/dL — ABNORMAL HIGH (ref 70–99)
Potassium: 4 mmol/L (ref 3.5–5.1)
Sodium: 136 mmol/L (ref 135–145)
Total Bilirubin: 3.3 mg/dL — ABNORMAL HIGH (ref 0.3–1.2)
Total Protein: 6 g/dL — ABNORMAL LOW (ref 6.5–8.1)

## 2023-05-29 LAB — CBC
HCT: 43.1 % (ref 39.0–52.0)
Hemoglobin: 14.1 g/dL (ref 13.0–17.0)
MCH: 31.1 pg (ref 26.0–34.0)
MCHC: 32.7 g/dL (ref 30.0–36.0)
MCV: 94.9 fL (ref 80.0–100.0)
Platelets: 135 10*3/uL — ABNORMAL LOW (ref 150–400)
RBC: 4.54 MIL/uL (ref 4.22–5.81)
RDW: 14 % (ref 11.5–15.5)
WBC: 9.5 10*3/uL (ref 4.0–10.5)
nRBC: 0 % (ref 0.0–0.2)

## 2023-05-29 MED ORDER — LORAZEPAM 2 MG/ML IJ SOLN
INTRAMUSCULAR | Status: AC
  Administered 2023-05-29: 1 mg via INTRAVENOUS
  Filled 2023-05-29: qty 1

## 2023-05-29 MED ORDER — GADOBUTROL 1 MMOL/ML IV SOLN
10.0000 mL | Freq: Once | INTRAVENOUS | Status: AC | PRN
  Administered 2023-05-29: 10 mL via INTRAVENOUS

## 2023-05-29 MED ORDER — LORAZEPAM 2 MG/ML IJ SOLN: 1.0000 mg | Freq: Once | INTRAMUSCULAR | Status: AC

## 2023-05-29 MED ORDER — LORAZEPAM 2 MG/ML IJ SOLN
1.0000 mg | Freq: Once | INTRAMUSCULAR | Status: AC | PRN
  Administered 2023-05-29: 1 mg via INTRAVENOUS
  Filled 2023-05-29: qty 1

## 2023-05-29 NOTE — Progress Notes (Signed)
PROGRESS NOTE    Alexander Howe  QMV:[id number] DOB: Sep 17, 1946 DOA: 05/27/2023 PCP: [name], MD    Brief Narrative:  77 year old with history of depression, anxiety, AAA and descending aortic aneurysm, hypertension, hyperlipidemia, CKD stage IIIb, obstructive sleep apnea and history of nephrolithiasis presented with abdominal pain, nausea and vomiting for 1 day.  Shortly started after eating.  In the emergency room he was afebrile.  WC count 12.5.  Creatinine 2.03.  Mildly elevated transaminases.  Total bilirubin 5.7, direct bilirubin 1.8.  CT scan notable for calculi of the urinary bladder without obstruction, multiple low-density pancreatic lesions, cholelithiasis without acute cholecystitis with normal CBD diameter.  Admitted with IV fluids, pain medications and nausea medications. Patient waiting to have MRCP done with abnormal pancreas.  Very anxious patient.   Assessment & Plan:   Acute upper quadrant abdominal pain Symptomatic cholelithiasis, possibly passed bile duct stone Abnormal liver function test, indirect hyperbilirubinemia Abnormal pancreatic morphology on CT scan.  Currently hemodynamically stable.  Pain is controlled.  Symptoms are controlled. IV fluid, pain medications. MRCP with anesthesia, scheduled for today afternoon. Pancreatic lesion, MRI pending GI following, bilirubin already improving.  Likely may not need ERCP unless pancreas has any lesions. Surgery following, anticipate lap chole if not needing pancreatic biopsy and ERCP.  Renal calculi/urinary bladder calculi: Patient has multiple urinary bladder calculi however without urinary symptoms.  No hydronephrosis or obstruction.  Monitor for symptoms.  Chronic medical issues including Anxiety and depression: On Paxil and Xanax Essential hypertension: On metoprolol.  Holding diuretics and ACE. Acute kidney injury on CKD stage IIIb: Baseline about 1.7.  Maintenance fluid.  Back to his usual  numbers. Obstructive sleep apnea: To use CPAP at night.   DVT prophylaxis: heparin injection 5,000 Units Start: 05/28/23 0600   Code Status: Full code Family Communication: Wife at the bedside 7/15 Disposition Plan: Status is: Inpatient Remains inpatient appropriate because: Inpatient procedures planned     Consultants:  Gastroenterology Surgery  Procedures:  None  Antimicrobials:  None   Subjective: Patient seen and examined.  He is pacing around in the nursing station.  Denies any complaints.  He is extremely anxious.  He is able anxious taking heparin injection.  Objective: Vitals:   05/28/23 2056 05/28/23 2156 05/29/23 0356 05/29/23 0726  BP: 133/79  (!) 151/80 (!) 143/86  Pulse: (!) 58 60 (!) 48   Resp:  16  16  Temp: 97.8 F (36.6 C)  98.8 F (37.1 C) 97.8 F (36.6 C)  TempSrc: Oral  Oral   SpO2: 99% 99% 100% 100%  Weight:      Height:       No intake or output data in the 24 hours ending 05/29/23 1102  Filed Weights   05/27/23 2033 05/28/23 1338  Weight: 116.6 kg 113.5 kg    Examination:  General exam: Appears comfortable and pacing around but anxious. Respiratory system: Clear to auscultation. Respiratory effort normal. Cardiovascular system: S1 & S2 heard, RRR. No pedal edema. Gastrointestinal system: Soft and nontender.  Bowel sound present. Central nervous system: Alert and oriented. No focal neurological deficits. Extremities: Symmetric 5 x 5 power. Skin: No rashes, lesions or ulcers    Data Reviewed: I have personally reviewed following labs and imaging studies  CBC: Recent Labs  Lab 05/27/23 2043 05/28/23 0343 05/29/23 0324  WBC 12.5* 11.2* 9.5  HGB 16.5 16.6 14.1  HCT 50.7 50.7 43.1  MCV 96.9 98.1 94.9  PLT 166 149* 135*  Basic Metabolic Panel: Recent Labs  Lab 05/27/23 2043 05/28/23 0343 05/29/23 0324  NA 138 138 136  K 4.7 5.1 4.0  CL 96* 103 102  CO2 30 22 26   GLUCOSE 140* 121* 101*  BUN 37* 35* 32*  CREATININE  2.03* 1.91* 1.61*  CALCIUM 10.4* 9.8 9.4   GFR: Estimated Creatinine Clearance: 52 mL/min (A) (by C-G formula based on SCr of 1.61 mg/dL (H)). Liver Function Tests: Recent Labs  Lab 05/27/23 2247 05/28/23 0343 05/29/23 0324  AST 276* 291* 182*  ALT 240* 282* 251*  ALKPHOS 52 53 54  BILITOT 5.7* 5.5* 3.3*  PROT 7.7 6.8 6.0*  ALBUMIN 4.2 3.5 3.3*   Recent Labs  Lab 05/27/23 2247  LIPASE 40   No results for input(s): "AMMONIA" in the last 168 hours. Coagulation Profile: Recent Labs  Lab 05/28/23 0408  INR 1.1   Cardiac Enzymes: No results for input(s): "CKTOTAL", "CKMB", "CKMBINDEX", "TROPONINI" in the last 168 hours. BNP (last 3 results) No results for input(s): "PROBNP" in the last 8760 hours. HbA1C: No results for input(s): "HGBA1C" in the last 72 hours. CBG: No results for input(s): "GLUCAP" in the last 168 hours. Lipid Profile: No results for input(s): "CHOL", "HDL", "LDLCALC", "TRIG", "CHOLHDL", "LDLDIRECT" in the last 72 hours. Thyroid Function Tests: No results for input(s): "TSH", "T4TOTAL", "FREET4", "T3FREE", "THYROIDAB" in the last 72 hours. Anemia Panel: No results for input(s): "VITAMINB12", "FOLATE", "FERRITIN", "TIBC", "IRON", "RETICCTPCT" in the last 72 hours. Sepsis Labs: No results for input(s): "PROCALCITON", "LATICACIDVEN" in the last 168 hours.  No results found for this or any previous visit (from the past 240 hour(s)).       Radiology Studies: US Abdomen Limited RUQ (LIVER/GB)  Result Date: 05/28/2023 EXAM: ULTRASOUND ABDOMEN LIMITED RIGHT UPPER QUADRANT COMPARISON:  None Available. FINDINGS: Gallbladder: No wall thickening or pericholecystic fluid. Stones are noted within the gallbladder, which measure up to 1.3 cm. No sonographic Murphy sign noted by sonographer. Common bile duct: Diameter: 6 mm, at the upper limit of normal, although the biliary tree is quite difficult to visualize. No intrahepatic biliary ductal dilatation is seen.  Liver: Evaluation is limited by limited acoustic windows. Within this limitation, no focal lesion identified. Within normal limits in parenchymal echogenicity. Portal vein is patent on color Doppler imaging with normal direction of blood flow towards the liver. Other: None. IMPRESSION: 1. Cholelithiasis without evidence of acute cholecystitis. 2. Common bile duct measures at the upper limit of normal, although the biliary tree is quite difficult to visualize. No intrahepatic biliary ductal dilatation is seen. 3. Limited evaluation of the liver due to limited acoustic windows. Within this limitation, no focal lesion is seen. Electronically Signed   By: Wiliam Ke M.D.   On: 05/28/2023 01:54   CT Angio Chest/Abd/Pel for Dissection W and/or Wo Contrast  Result Date: 05/28/2023 CLINICAL DATA:  Abdominal pain.  History of aortic aneurysm. EXAM: CT ANGIOGRAPHY CHEST, ABDOMEN AND PELVIS TECHNIQUE: Non-contrast CT of the chest was initially obtained. Multidetector CT imaging through the chest, abdomen and pelvis was performed using the standard protocol during bolus administration of intravenous contrast. Multiplanar reconstructed images and MIPs were obtained and reviewed to evaluate the vascular anatomy. RADIATION DOSE REDUCTION: This exam was performed according to the departmental dose-optimization program which includes automated exposure control, adjustment of the mA and/or kV according to patient size and/or use of iterative reconstruction technique. CONTRAST:  75mL OMNIPAQUE IOHEXOL 350 MG/ML SOLN COMPARISON:  CTA chest 07/26/2020; aortic ultrasound 08/25/2019  FINDINGS: CTA CHEST FINDINGS Cardiovascular: Normal heart size. No pericardial effusion. Aortic atherosclerotic calcification without dissection the ascending aorta is mildly dilated measuring 40 mm in diameter. Mediastinum/Nodes: Trachea and esophagus are unremarkable. No thoracic adenopathy. Lungs/Pleura: Scattered areas of scarring and  bronchiolectasis, favored postinfectious given findings on CT 07/26/2020. No focal consolidation, pleural effusion, or pneumothorax. Musculoskeletal: No chest wall abnormality. No acute or significant osseous findings. Review of the MIP images confirms the above findings. CTA ABDOMEN AND PELVIS FINDINGS VASCULAR Aorta: Calcified atherosclerotic plaque without aneurysm or dissection. Celiac: Patent without aneurysm or dissection. SMA: Patent without aneurysm or dissection. Renals: Mild narrowing at the origins of the renal arteries bilaterally secondary to calcified plaque. No aneurysm or dissection. IMA: Patent. Inflow: Scattered calcified atherosclerotic plaque without significant narrowing. No aneurysm or dissection. Veins: No obvious venous abnormality within the limitations of this arterial phase study. Review of the MIP images confirms the above findings. NON-VASCULAR Hepatobiliary: Hepatic cysts. Cholelithiasis. No evidence of cholecystitis. No biliary dilation. Pancreas: Ill-defined low-density lesion in the uncinate process of the pancreas (6/202) measuring 16 x 11 mm. Additional low-density lesions in the tail of the pancreas measuring 18 mm (6/181) and 14 mm (6/188). No evidence of acute pancreatitis. No pancreatic ductal dilation. Spleen: Unremarkable. Adrenals/Urinary Tract: Stable adrenal glands compared with 07/26/2020. Low-attenuation lesions in the kidneys are statistically likely to represent cysts. No follow-up is required. Four calculi are present within the bladder with the largest measuring 12 mm and located near the right UVJ. No hydronephrosis to suggest obstruction. Mild diffuse bladder wall thickening. Stomach/Bowel: Normal caliber large and small bowel. Postoperative change about the sigmoid colon. No bowel wall thickening. Normal appendix. Stomach is within normal limits. Lymphatic: No lymphadenopathy. Reproductive: Enlarged prostate. Other: No free intraperitoneal fluid or air.  Musculoskeletal: No acute fracture. Review of the MIP images confirms the above findings. IMPRESSION: 1. No acute aortic syndrome. 2. Mildly dilated ascending aorta measuring 40 mm in diameter. Recommend annual imaging followup by CTA or MRA. This recommendation follows 2010 ACCF/AHA/AATS/ACR/ASA/SCA/SCAI/SIR/STS/SVM Guidelines for the Diagnosis and Management of Patients with Thoracic Aortic Disease. Circulation. 2010; 121: Y865-H846. Aortic aneurysm NOS (ICD10-I71.9) 3. Multiple low-density lesions in the pancreas measuring up to 18 mm. Recommend further evaluation with nonemergent MRI with and without contrast. 4. Four calculi are present within the bladder with the largest measuring 12 mm and located near the right UVJ. No hydronephrosis to suggest obstruction. Aortic Atherosclerosis (ICD10-I70.0). Electronically Signed   By: Minerva Fester M.D.   On: 05/28/2023 00:26        Scheduled Meds:  heparin  5,000 Units Subcutaneous Q8H   LORazepam  1 mg Oral Once   metoprolol succinate  100 mg Oral QHS   PARoxetine  10 mg Oral Daily   sodium chloride flush  3 mL Intravenous Q12H   Continuous Infusions:   LOS: 1 day    Time spent: 40 minutes.     Dorcas Carrow, MD [facility]

## 2023-05-29 NOTE — Progress Notes (Addendum)
Daily Progress Note  DOA: 05/27/2023 Hospital Day: 3 Chief Complaint: Elevated liver tests   Attending physician's note   I have taken a history, reviewed the chart and examined the patient. I performed a substantive portion of this encounter, including complete performance of at least one of the key components, in conjunction with the APP. I agree with the APP's note, impression and recommendations.    Cholecystitis and 3 mm choledocholithiasis, plan for ERCP tomorrow afternoon with Dr. Leone Payor N.p.o. after midnight Surgery evaluated patient for possible cholecystectomy following ERCP  MRCP suggestive of sidebranch IPMN and cystic lesion benign appearing, will plan for repeat MRCP in 6 to 12 months to document stability, will arrange for outpatient follow-up  The patient was provided an opportunity to ask questions and all were answered. The patient agreed with the plan and demonstrated an understanding of the instructions.   Iona Beard , MD [phone]    Assessment and Plan:    Brief Narrative:  JAYMARI CROMIE is a 77 y.o. year old male with a past medical history not limited to  adenomatous colon polyps, OSA on CPAP, kidney stones, CKD3, depression, anxiety, AAA, hypertension, hyperlipidemia , cholelithiasis  Acute bilateral flank pain, generalized abdominal pain, back pain, nausea and vomiting in setting of cholelithiasis  and abnormal liver chemistries ( mixed pattern).  No evidence for acute cholecystitis on imaging. Though he could have choledocholithiasis it is interesting that his bilirubin elevation is predominantly indirect, his alkaline phosphatase is normal and there is no biliary duct dilation on CT scan.  -Liver tests improving. Tbili down from 5.5 to 3.3. He hasn't had any further pain.  -Leaving now to get MRCP -If MRCP negative for CBD stones and pancreas lesions reassuring then may get lap chole today per surgery   Multiple pancreatic lesions on CTA.   CTA shows multiple low-density lesions in the pancreas measuring up to 18 mm.  -Further evaluation by MRI  / MRCP   Chronic, intermittent thrombocytopenia.  Platelets stable at 135.  No splenomegaly on CTA    Subjective / New Events:  No further abdominal pain. Head to get MRI / MRCP   Objective:   Recent Labs    05/27/23 2043 05/28/23 0343 05/29/23 0324  WBC 12.5* 11.2* 9.5  HGB 16.5 16.6 14.1  HCT 50.7 50.7 43.1  PLT 166 149* 135*   BMET Recent Labs    05/27/23 2043 05/28/23 0343 05/29/23 0324  NA 138 138 136  K 4.7 5.1 4.0  CL 96* 103 102  CO2 30 22 26   GLUCOSE 140* 121* 101*  BUN 37* 35* 32*  CREATININE 2.03* 1.91* 1.61*  CALCIUM 10.4* 9.8 9.4   LFT Recent Labs    05/27/23 2247 05/28/23 0343 05/29/23 0324  PROT 7.7   < > 6.0*  ALBUMIN 4.2   < > 3.3*  AST 276*   < > 182*  ALT 240*   < > 251*  ALKPHOS 52   < > 54  BILITOT 5.7*   < > 3.3*  BILIDIR 1.7*  --   --   IBILI 4.0*  --   --    < > = values in this interval not displayed.   PT/INR Recent Labs    05/28/23 0408  LABPROT 14.4  INR 1.1     Imaging:  US Abdomen Limited RUQ (LIVER/GB) EXAM: ULTRASOUND ABDOMEN LIMITED RIGHT UPPER QUADRANT  COMPARISON:  None Available.  FINDINGS: Gallbladder:  No wall thickening  or pericholecystic fluid. Stones are noted within the gallbladder, which measure up to 1.3 cm. No sonographic Murphy sign noted by sonographer.  Common bile duct:  Diameter: 6 mm, at the upper limit of normal, although the biliary tree is quite difficult to visualize. No intrahepatic biliary ductal dilatation is seen.  Liver:  Evaluation is limited by limited acoustic windows. Within this limitation, no focal lesion identified. Within normal limits in parenchymal echogenicity. Portal vein is patent on color Doppler imaging with normal direction of blood flow towards the liver.  Other: None.  IMPRESSION: 1. Cholelithiasis without evidence of acute cholecystitis. 2.  Common bile duct measures at the upper limit of normal, although the biliary tree is quite difficult to visualize. No intrahepatic biliary ductal dilatation is seen. 3. Limited evaluation of the liver due to limited acoustic windows. Within this limitation, no focal lesion is seen.  Electronically Signed   By: Wiliam Ke M.D.   On: 05/28/2023 01:54 CT Angio Chest/Abd/Pel for Dissection W and/or Wo Contrast CLINICAL DATA:  Abdominal pain.  History of aortic aneurysm.  EXAM: CT ANGIOGRAPHY CHEST, ABDOMEN AND PELVIS  TECHNIQUE: Non-contrast CT of the chest was initially obtained.  Multidetector CT imaging through the chest, abdomen and pelvis was performed using the standard protocol during bolus administration of intravenous contrast. Multiplanar reconstructed images and MIPs were obtained and reviewed to evaluate the vascular anatomy.  RADIATION DOSE REDUCTION: This exam was performed according to the departmental dose-optimization program which includes automated exposure control, adjustment of the mA and/or kV according to patient size and/or use of iterative reconstruction technique.  CONTRAST:  75mL OMNIPAQUE IOHEXOL 350 MG/ML SOLN  COMPARISON:  CTA chest 07/26/2020; aortic ultrasound 08/25/2019  FINDINGS: CTA CHEST FINDINGS  Cardiovascular: Normal heart size. No pericardial effusion. Aortic atherosclerotic calcification without dissection the ascending aorta is mildly dilated measuring 40 mm in diameter.  Mediastinum/Nodes: Trachea and esophagus are unremarkable. No thoracic adenopathy.  Lungs/Pleura: Scattered areas of scarring and bronchiolectasis, favored postinfectious given findings on CT 07/26/2020. No focal consolidation, pleural effusion, or pneumothorax.  Musculoskeletal: No chest wall abnormality. No acute or significant osseous findings.  Review of the MIP images confirms the above findings.  CTA ABDOMEN AND PELVIS FINDINGS  VASCULAR  Aorta:  Calcified atherosclerotic plaque without aneurysm or dissection.  Celiac: Patent without aneurysm or dissection.  SMA: Patent without aneurysm or dissection.  Renals: Mild narrowing at the origins of the renal arteries bilaterally secondary to calcified plaque. No aneurysm or dissection.  IMA: Patent.  Inflow: Scattered calcified atherosclerotic plaque without significant narrowing. No aneurysm or dissection.  Veins: No obvious venous abnormality within the limitations of this arterial phase study.  Review of the MIP images confirms the above findings.  NON-VASCULAR  Hepatobiliary: Hepatic cysts. Cholelithiasis. No evidence of cholecystitis. No biliary dilation.  Pancreas: Ill-defined low-density lesion in the uncinate process of the pancreas (6/202) measuring 16 x 11 mm. Additional low-density lesions in the tail of the pancreas measuring 18 mm (6/181) and 14 mm (6/188). No evidence of acute pancreatitis. No pancreatic ductal dilation.  Spleen: Unremarkable.  Adrenals/Urinary Tract: Stable adrenal glands compared with 07/26/2020. Low-attenuation lesions in the kidneys are statistically likely to represent cysts. No follow-up is required. Four calculi are present within the bladder with the largest measuring 12 mm and located near the right UVJ. No hydronephrosis to suggest obstruction. Mild diffuse bladder wall thickening.  Stomach/Bowel: Normal caliber large and small bowel. Postoperative change about the sigmoid colon. No bowel wall thickening. Normal  appendix. Stomach is within normal limits.  Lymphatic: No lymphadenopathy.  Reproductive: Enlarged prostate.  Other: No free intraperitoneal fluid or air.  Musculoskeletal: No acute fracture.  Review of the MIP images confirms the above findings.  IMPRESSION: 1. No acute aortic syndrome. 2. Mildly dilated ascending aorta measuring 40 mm in diameter. Recommend annual imaging followup by CTA or MRA. This  recommendation follows 2010 ACCF/AHA/AATS/ACR/ASA/SCA/SCAI/SIR/STS/SVM Guidelines for the Diagnosis and Management of Patients with Thoracic Aortic Disease. Circulation. 2010; 121: Z610-R604. Aortic aneurysm NOS (ICD10-I71.9) 3. Multiple low-density lesions in the pancreas measuring up to 18 mm. Recommend further evaluation with nonemergent MRI with and without contrast. 4. Four calculi are present within the bladder with the largest measuring 12 mm and located near the right UVJ. No hydronephrosis to suggest obstruction.  Aortic Atherosclerosis (ICD10-I70.0).  Electronically Signed   By: Minerva Fester M.D.   On: 05/28/2023 00:26     Scheduled inpatient medications:   heparin  5,000 Units Subcutaneous Q8H   LORazepam  1 mg Oral Once   metoprolol succinate  100 mg Oral QHS   PARoxetine  10 mg Oral Daily   sodium chloride flush  3 mL Intravenous Q12H   Continuous inpatient infusions:  PRN inpatient medications: acetaminophen, ALPRAZolam, fentaNYL (SUBLIMAZE) injection, LORazepam, ondansetron **OR** ondansetron (ZOFRAN) IV  Vital signs in last 24 hours: Temp:  [97.8 F (36.6 C)-98.8 F (37.1 C)] 97.8 F (36.6 C) (07/16 0726) Pulse Rate:  [48-103] 48 (07/16 0356) Resp:  [16-18] 16 (07/16 0726) BP: (127-151)/(73-87) 143/86 (07/16 0726) SpO2:  [97 %-100 %] 100 % (07/16 0726) Weight:  [113.5 kg] 113.5 kg (07/15 1338) Last BM Date : 05/27/23 No intake or output data in the 24 hours ending 05/29/23 1046  Intake/Output from previous day: No intake/output data recorded. Intake/Output this shift: No intake/output data recorded.   Physical Exam:  General: Alert male in NAD Heart:  Regular rate and rhythm.  Pulmonary: Normal respiratory effort Abdomen: Soft, nondistended, nontender. Normal bowel sounds. Extremities: No lower extremity edema  Neurologic: Alert and oriented Psych: Pleasant. Cooperative. Insight appears normal.    Principal Problem:   Elevated  LFTs Active Problems:   Anxiety   Chronic kidney disease, stage 3 unspecified (HCC)   Benign essential hypertension   Depression   OSA on CPAP     LOS: 1 day   Willette Cluster ,NP 05/29/2023, 10:46 AM

## 2023-05-29 NOTE — H&P (View-Only) (Signed)
Daily Progress Note  DOA: 05/27/2023 Hospital Day: 3 Chief Complaint: Elevated liver tests   Attending physician's note   I have taken a history, reviewed the chart and examined the patient. I performed a substantive portion of this encounter, including complete performance of at least one of the key components, in conjunction with the APP. I agree with the APP's note, impression and recommendations.    Cholecystitis and 3 mm choledocholithiasis, plan for ERCP tomorrow afternoon with Dr. Leone Payor N.p.o. after midnight Surgery evaluated patient for possible cholecystectomy following ERCP  MRCP suggestive of sidebranch IPMN and cystic lesion benign appearing, will plan for repeat MRCP in 6 to 12 months to document stability, will arrange for outpatient follow-up  The patient was provided an opportunity to ask questions and all were answered. The patient agreed with the plan and demonstrated an understanding of the instructions.   Iona Beard , MD [phone]    Assessment and Plan:    Brief Narrative:  Alexander Howe is a 77 y.o. year old male with a past medical history not limited to  adenomatous colon polyps, OSA on CPAP, kidney stones, CKD3, depression, anxiety, AAA, hypertension, hyperlipidemia , cholelithiasis  Acute bilateral flank pain, generalized abdominal pain, back pain, nausea and vomiting in setting of cholelithiasis  and abnormal liver chemistries ( mixed pattern).  No evidence for acute cholecystitis on imaging. Though he could have choledocholithiasis it is interesting that his bilirubin elevation is predominantly indirect, his alkaline phosphatase is normal and there is no biliary duct dilation on CT scan.  -Liver tests improving. Tbili down from 5.5 to 3.3. He hasn't had any further pain.  -Leaving now to get MRCP -If MRCP negative for CBD stones and pancreas lesions reassuring then may get lap chole today per surgery   Multiple pancreatic lesions on CTA.   CTA shows multiple low-density lesions in the pancreas measuring up to 18 mm.  -Further evaluation by MRI  / MRCP   Chronic, intermittent thrombocytopenia.  Platelets stable at 135.  No splenomegaly on CTA    Subjective / New Events:  No further abdominal pain. Head to get MRI / MRCP   Objective:   Recent Labs    05/27/23 2043 05/28/23 0343 05/29/23 0324  WBC 12.5* 11.2* 9.5  HGB 16.5 16.6 14.1  HCT 50.7 50.7 43.1  PLT 166 149* 135*   BMET Recent Labs    05/27/23 2043 05/28/23 0343 05/29/23 0324  NA 138 138 136  K 4.7 5.1 4.0  CL 96* 103 102  CO2 30 22 26   GLUCOSE 140* 121* 101*  BUN 37* 35* 32*  CREATININE 2.03* 1.91* 1.61*  CALCIUM 10.4* 9.8 9.4   LFT Recent Labs    05/27/23 2247 05/28/23 0343 05/29/23 0324  PROT 7.7   < > 6.0*  ALBUMIN 4.2   < > 3.3*  AST 276*   < > 182*  ALT 240*   < > 251*  ALKPHOS 52   < > 54  BILITOT 5.7*   < > 3.3*  BILIDIR 1.7*  --   --   IBILI 4.0*  --   --    < > = values in this interval not displayed.   PT/INR Recent Labs    05/28/23 0408  LABPROT 14.4  INR 1.1     Imaging:  US Abdomen Limited RUQ (LIVER/GB) EXAM: ULTRASOUND ABDOMEN LIMITED RIGHT UPPER QUADRANT  COMPARISON:  None Available.  FINDINGS: Gallbladder:  No wall thickening  or pericholecystic fluid. Stones are noted within the gallbladder, which measure up to 1.3 cm. No sonographic Murphy sign noted by sonographer.  Common bile duct:  Diameter: 6 mm, at the upper limit of normal, although the biliary tree is quite difficult to visualize. No intrahepatic biliary ductal dilatation is seen.  Liver:  Evaluation is limited by limited acoustic windows. Within this limitation, no focal lesion identified. Within normal limits in parenchymal echogenicity. Portal vein is patent on color Doppler imaging with normal direction of blood flow towards the liver.  Other: None.  IMPRESSION: 1. Cholelithiasis without evidence of acute cholecystitis. 2.  Common bile duct measures at the upper limit of normal, although the biliary tree is quite difficult to visualize. No intrahepatic biliary ductal dilatation is seen. 3. Limited evaluation of the liver due to limited acoustic windows. Within this limitation, no focal lesion is seen.  Electronically Signed   By: Wiliam Ke M.D.   On: 05/28/2023 01:54 CT Angio Chest/Abd/Pel for Dissection W and/or Wo Contrast CLINICAL DATA:  Abdominal pain.  History of aortic aneurysm.  EXAM: CT ANGIOGRAPHY CHEST, ABDOMEN AND PELVIS  TECHNIQUE: Non-contrast CT of the chest was initially obtained.  Multidetector CT imaging through the chest, abdomen and pelvis was performed using the standard protocol during bolus administration of intravenous contrast. Multiplanar reconstructed images and MIPs were obtained and reviewed to evaluate the vascular anatomy.  RADIATION DOSE REDUCTION: This exam was performed according to the departmental dose-optimization program which includes automated exposure control, adjustment of the mA and/or kV according to patient size and/or use of iterative reconstruction technique.  CONTRAST:  75mL OMNIPAQUE IOHEXOL 350 MG/ML SOLN  COMPARISON:  CTA chest 07/26/2020; aortic ultrasound 08/25/2019  FINDINGS: CTA CHEST FINDINGS  Cardiovascular: Normal heart size. No pericardial effusion. Aortic atherosclerotic calcification without dissection the ascending aorta is mildly dilated measuring 40 mm in diameter.  Mediastinum/Nodes: Trachea and esophagus are unremarkable. No thoracic adenopathy.  Lungs/Pleura: Scattered areas of scarring and bronchiolectasis, favored postinfectious given findings on CT 07/26/2020. No focal consolidation, pleural effusion, or pneumothorax.  Musculoskeletal: No chest wall abnormality. No acute or significant osseous findings.  Review of the MIP images confirms the above findings.  CTA ABDOMEN AND PELVIS FINDINGS  VASCULAR  Aorta:  Calcified atherosclerotic plaque without aneurysm or dissection.  Celiac: Patent without aneurysm or dissection.  SMA: Patent without aneurysm or dissection.  Renals: Mild narrowing at the origins of the renal arteries bilaterally secondary to calcified plaque. No aneurysm or dissection.  IMA: Patent.  Inflow: Scattered calcified atherosclerotic plaque without significant narrowing. No aneurysm or dissection.  Veins: No obvious venous abnormality within the limitations of this arterial phase study.  Review of the MIP images confirms the above findings.  NON-VASCULAR  Hepatobiliary: Hepatic cysts. Cholelithiasis. No evidence of cholecystitis. No biliary dilation.  Pancreas: Ill-defined low-density lesion in the uncinate process of the pancreas (6/202) measuring 16 x 11 mm. Additional low-density lesions in the tail of the pancreas measuring 18 mm (6/181) and 14 mm (6/188). No evidence of acute pancreatitis. No pancreatic ductal dilation.  Spleen: Unremarkable.  Adrenals/Urinary Tract: Stable adrenal glands compared with 07/26/2020. Low-attenuation lesions in the kidneys are statistically likely to represent cysts. No follow-up is required. Four calculi are present within the bladder with the largest measuring 12 mm and located near the right UVJ. No hydronephrosis to suggest obstruction. Mild diffuse bladder wall thickening.  Stomach/Bowel: Normal caliber large and small bowel. Postoperative change about the sigmoid colon. No bowel wall thickening. Normal  appendix. Stomach is within normal limits.  Lymphatic: No lymphadenopathy.  Reproductive: Enlarged prostate.  Other: No free intraperitoneal fluid or air.  Musculoskeletal: No acute fracture.  Review of the MIP images confirms the above findings.  IMPRESSION: 1. No acute aortic syndrome. 2. Mildly dilated ascending aorta measuring 40 mm in diameter. Recommend annual imaging followup by CTA or MRA. This  recommendation follows 2010 ACCF/AHA/AATS/ACR/ASA/SCA/SCAI/SIR/STS/SVM Guidelines for the Diagnosis and Management of Patients with Thoracic Aortic Disease. Circulation. 2010; 121: Z610-R604. Aortic aneurysm NOS (ICD10-I71.9) 3. Multiple low-density lesions in the pancreas measuring up to 18 mm. Recommend further evaluation with nonemergent MRI with and without contrast. 4. Four calculi are present within the bladder with the largest measuring 12 mm and located near the right UVJ. No hydronephrosis to suggest obstruction.  Aortic Atherosclerosis (ICD10-I70.0).  Electronically Signed   By: Minerva Fester M.D.   On: 05/28/2023 00:26     Scheduled inpatient medications:   heparin  5,000 Units Subcutaneous Q8H   LORazepam  1 mg Oral Once   metoprolol succinate  100 mg Oral QHS   PARoxetine  10 mg Oral Daily   sodium chloride flush  3 mL Intravenous Q12H   Continuous inpatient infusions:  PRN inpatient medications: acetaminophen, ALPRAZolam, fentaNYL (SUBLIMAZE) injection, LORazepam, ondansetron **OR** ondansetron (ZOFRAN) IV  Vital signs in last 24 hours: Temp:  [97.8 F (36.6 C)-98.8 F (37.1 C)] 97.8 F (36.6 C) (07/16 0726) Pulse Rate:  [48-103] 48 (07/16 0356) Resp:  [16-18] 16 (07/16 0726) BP: (127-151)/(73-87) 143/86 (07/16 0726) SpO2:  [97 %-100 %] 100 % (07/16 0726) Weight:  [113.5 kg] 113.5 kg (07/15 1338) Last BM Date : 05/27/23 No intake or output data in the 24 hours ending 05/29/23 1046  Intake/Output from previous day: No intake/output data recorded. Intake/Output this shift: No intake/output data recorded.   Physical Exam:  General: Alert male in NAD Heart:  Regular rate and rhythm.  Pulmonary: Normal respiratory effort Abdomen: Soft, nondistended, nontender. Normal bowel sounds. Extremities: No lower extremity edema  Neurologic: Alert and oriented Psych: Pleasant. Cooperative. Insight appears normal.    Principal Problem:   Elevated  LFTs Active Problems:   Anxiety   Chronic kidney disease, stage 3 unspecified (HCC)   Benign essential hypertension   Depression   OSA on CPAP     LOS: 1 day   Willette Cluster ,NP 05/29/2023, 10:46 AM

## 2023-05-29 NOTE — Progress Notes (Signed)
[facility] night cross cover note:   I was notified by RN that the patient is amenable to pursuing MRCP today so long as he is able to have a dose of Ativan beforehand.  I subsequently placed order for Ativan 1 mg IV x 1 dose prn for anxiety leading up to MRCP.    Alexander Pigg, DO Hospitalist

## 2023-05-29 NOTE — Plan of Care (Signed)
  Problem: Education: Goal: Knowledge of General Education information will improve Description: Including pain rating scale, medication(s)/side effects and non-pharmacologic comfort measures Outcome: Progressing   Problem: Health Behavior/Discharge Planning: Goal: Ability to manage health-related needs will improve Outcome: Progressing   Problem: Clinical Measurements: Goal: Ability to maintain clinical measurements within normal limits will improve Outcome: Progressing Goal: Will remain free from infection Outcome: Progressing Goal: Diagnostic test results will improve Outcome: Progressing Goal: Respiratory complications will improve Outcome: Progressing Goal: Cardiovascular complication will be avoided Outcome: Progressing   Problem: Activity: Goal: Risk for activity intolerance will decrease Outcome: Progressing   Problem: Nutrition: Goal: Adequate nutrition will be maintained Outcome: Progressing   Problem: Coping: Goal: Level of anxiety will decrease Outcome: Progressing   Problem: Elimination: Goal: Will not experience complications related to bowel motility Outcome: Progressing Goal: Will not experience complications related to urinary retention Outcome: Progressing   Problem: Pain Managment: Goal: General experience of comfort will improve Outcome: Progressing   Problem: Pain Managment: Goal: General experience of comfort will improve Outcome: Progressing   Problem: Pain Managment: Goal: General experience of comfort will improve Outcome: Progressing

## 2023-05-29 NOTE — Progress Notes (Signed)
Patient is willing to do MRI with ativan  at this time. Notified Provider . Awaiting further orders at this time.

## 2023-05-29 NOTE — Progress Notes (Signed)
Subjective: CC: Abdominal pain resolved. No n/v. NPO for MRCP.   Afebrile. No tachycardia or hypotension. WBC wnl. LFT's downtrending with T. BIli 3.3 from 5.5.   Objective: Vital signs in last 24 hours: Temp:  [97.8 F (36.6 C)-98.8 F (37.1 C)] 97.8 F (36.6 C) (07/16 0726) Pulse Rate:  [48-103] 48 (07/16 0356) Resp:  [16-18] 16 (07/16 0726) BP: (127-151)/(73-87) 143/86 (07/16 0726) SpO2:  [97 %-100 %] 100 % (07/16 0726) Weight:  [113.5 kg] 113.5 kg (07/15 1338) Last BM Date : 05/27/23  Intake/Output from previous day: No intake/output data recorded. Intake/Output this shift: No intake/output data recorded.  PE: Gen:  Alert, NAD, pleasant Abd: Soft, ND, NT  Lab Results:  Recent Labs    05/28/23 0343 05/29/23 0324  WBC 11.2* 9.5  HGB 16.6 14.1  HCT 50.7 43.1  PLT 149* 135*   BMET Recent Labs    05/28/23 0343 05/29/23 0324  NA 138 136  K 5.1 4.0  CL 103 102  CO2 22 26  GLUCOSE 121* 101*  BUN 35* 32*  CREATININE 1.91* 1.61*  CALCIUM 9.8 9.4   PT/INR Recent Labs    05/28/23 0408  LABPROT 14.4  INR 1.1   CMP     Component Value Date/Time   NA 136 05/29/2023 0324   NA 138 10/18/2012 0339   K 4.0 05/29/2023 0324   K 3.7 10/18/2012 0339   CL 102 05/29/2023 0324   CL 105 10/18/2012 0339   CO2 26 05/29/2023 0324   CO2 30 10/18/2012 0339   GLUCOSE 101 (H) 05/29/2023 0324   GLUCOSE 132 (H) 10/18/2012 0339   BUN 32 (H) 05/29/2023 0324   BUN 20 (H) 10/18/2012 0339   CREATININE 1.61 (H) 05/29/2023 0324   CREATININE 1.30 10/18/2012 0339   CALCIUM 9.4 05/29/2023 0324   CALCIUM 8.4 (L) 10/18/2012 0339   PROT 6.0 (L) 05/29/2023 0324   ALBUMIN 3.3 (L) 05/29/2023 0324   AST 182 (H) 05/29/2023 0324   ALT 251 (H) 05/29/2023 0324   ALKPHOS 54 05/29/2023 0324   BILITOT 3.3 (H) 05/29/2023 0324   GFRNONAA 44 (L) 05/29/2023 0324   GFRNONAA 57 (L) 10/18/2012 0339   GFRAA >60 12/29/2015 0544   GFRAA >60 10/18/2012 0339   Lipase     Component  Value Date/Time   LIPASE 40 05/27/2023 2247    Studies/Results: US Abdomen Limited RUQ (LIVER/GB)  Result Date: 05/28/2023 EXAM: ULTRASOUND ABDOMEN LIMITED RIGHT UPPER QUADRANT COMPARISON:  None Available. FINDINGS: Gallbladder: No wall thickening or pericholecystic fluid. Stones are noted within the gallbladder, which measure up to 1.3 cm. No sonographic Murphy sign noted by sonographer. Common bile duct: Diameter: 6 mm, at the upper limit of normal, although the biliary tree is quite difficult to visualize. No intrahepatic biliary ductal dilatation is seen. Liver: Evaluation is limited by limited acoustic windows. Within this limitation, no focal lesion identified. Within normal limits in parenchymal echogenicity. Portal vein is patent on color Doppler imaging with normal direction of blood flow towards the liver. Other: None. IMPRESSION: 1. Cholelithiasis without evidence of acute cholecystitis. 2. Common bile duct measures at the upper limit of normal, although the biliary tree is quite difficult to visualize. No intrahepatic biliary ductal dilatation is seen. 3. Limited evaluation of the liver due to limited acoustic windows. Within this limitation, no focal lesion is seen. Electronically Signed   By: Wiliam Ke M.D.   On: 05/28/2023 01:54   CT Angio Chest/Abd/Pel  for Dissection W and/or Wo Contrast  Result Date: 05/28/2023 CLINICAL DATA:  Abdominal pain.  History of aortic aneurysm. EXAM: CT ANGIOGRAPHY CHEST, ABDOMEN AND PELVIS TECHNIQUE: Non-contrast CT of the chest was initially obtained. Multidetector CT imaging through the chest, abdomen and pelvis was performed using the standard protocol during bolus administration of intravenous contrast. Multiplanar reconstructed images and MIPs were obtained and reviewed to evaluate the vascular anatomy. RADIATION DOSE REDUCTION: This exam was performed according to the departmental dose-optimization program which includes automated exposure control,  adjustment of the mA and/or kV according to patient size and/or use of iterative reconstruction technique. CONTRAST:  75mL OMNIPAQUE IOHEXOL 350 MG/ML SOLN COMPARISON:  CTA chest 07/26/2020; aortic ultrasound 08/25/2019 FINDINGS: CTA CHEST FINDINGS Cardiovascular: Normal heart size. No pericardial effusion. Aortic atherosclerotic calcification without dissection the ascending aorta is mildly dilated measuring 40 mm in diameter. Mediastinum/Nodes: Trachea and esophagus are unremarkable. No thoracic adenopathy. Lungs/Pleura: Scattered areas of scarring and bronchiolectasis, favored postinfectious given findings on CT 07/26/2020. No focal consolidation, pleural effusion, or pneumothorax. Musculoskeletal: No chest wall abnormality. No acute or significant osseous findings. Review of the MIP images confirms the above findings. CTA ABDOMEN AND PELVIS FINDINGS VASCULAR Aorta: Calcified atherosclerotic plaque without aneurysm or dissection. Celiac: Patent without aneurysm or dissection. SMA: Patent without aneurysm or dissection. Renals: Mild narrowing at the origins of the renal arteries bilaterally secondary to calcified plaque. No aneurysm or dissection. IMA: Patent. Inflow: Scattered calcified atherosclerotic plaque without significant narrowing. No aneurysm or dissection. Veins: No obvious venous abnormality within the limitations of this arterial phase study. Review of the MIP images confirms the above findings. NON-VASCULAR Hepatobiliary: Hepatic cysts. Cholelithiasis. No evidence of cholecystitis. No biliary dilation. Pancreas: Ill-defined low-density lesion in the uncinate process of the pancreas (6/202) measuring 16 x 11 mm. Additional low-density lesions in the tail of the pancreas measuring 18 mm (6/181) and 14 mm (6/188). No evidence of acute pancreatitis. No pancreatic ductal dilation. Spleen: Unremarkable. Adrenals/Urinary Tract: Stable adrenal glands compared with 07/26/2020. Low-attenuation lesions in the  kidneys are statistically likely to represent cysts. No follow-up is required. Four calculi are present within the bladder with the largest measuring 12 mm and located near the right UVJ. No hydronephrosis to suggest obstruction. Mild diffuse bladder wall thickening. Stomach/Bowel: Normal caliber large and small bowel. Postoperative change about the sigmoid colon. No bowel wall thickening. Normal appendix. Stomach is within normal limits. Lymphatic: No lymphadenopathy. Reproductive: Enlarged prostate. Other: No free intraperitoneal fluid or air. Musculoskeletal: No acute fracture. Review of the MIP images confirms the above findings. IMPRESSION: 1. No acute aortic syndrome. 2. Mildly dilated ascending aorta measuring 40 mm in diameter. Recommend annual imaging followup by CTA or MRA. This recommendation follows 2010 ACCF/AHA/AATS/ACR/ASA/SCA/SCAI/SIR/STS/SVM Guidelines for the Diagnosis and Management of Patients with Thoracic Aortic Disease. Circulation. 2010; 121: Z610-R604. Aortic aneurysm NOS (ICD10-I71.9) 3. Multiple low-density lesions in the pancreas measuring up to 18 mm. Recommend further evaluation with nonemergent MRI with and without contrast. 4. Four calculi are present within the bladder with the largest measuring 12 mm and located near the right UVJ. No hydronephrosis to suggest obstruction. Aortic Atherosclerosis (ICD10-I70.0). Electronically Signed   By: Minerva Fester M.D.   On: 05/28/2023 00:26    Anti-infectives: Anti-infectives (From admission, onward)    None        Assessment/Plan Cholelithiasis with transaminitis and hyperbilirubinemia  Possible choledocholithiasis  - RUQ [place] with CBD upper limit of normal, no evidence of acute cholecystitis. Afebrile. WBC wnl.  -  CT with low density lesions in the pancreas - GI recommended MRI/MRCP - LFT's downtrending. With resolution of pain, question if he passed a CBD stone.  - MRCP pending. Would keep npo for now incase this results  early today, is negative for choledocholithiasis, area on pancreas is reassuring and there is time on schedule for lap chole today - Surgery will follow as well   FEN - NPO for MRCP. See above for recs. IVF per [facility] VTE - SCDs, subcutaneous heparin ID - None  Per [facility] Bladder calculi  Dilated ascending aorta CKD stage III HTN HLD Melanoma of the skin Hx of rheumatic fever OSA Peripheral vascular disease Depression/anxiety  I reviewed nursing notes, Consultant (GI) notes, hospitalist notes, last 24 h vitals and pain scores, last 48 h intake and output, last 24 h labs and trends, and last 24 h imaging results.   LOS: 1 day    Jacinto Halim , [facility] Surgery 05/29/2023, 9:11 AM Please see Amion for pager number during day hours 7:00am-4:30pm

## 2023-05-30 ENCOUNTER — Encounter (HOSPITAL_COMMUNITY)
Admission: RE | Disposition: A | Discharge status: Other Acute Inpatient Hospital | Payer: Self-pay | Source: Home / Self Care | Attending: Internal Medicine

## 2023-05-30 ENCOUNTER — Inpatient Hospital Stay (HOSPITAL_COMMUNITY): Payer: Medicare HMO | Admitting: Anesthesiology

## 2023-05-30 ENCOUNTER — Encounter (HOSPITAL_COMMUNITY): Payer: Self-pay | Admitting: Family Medicine

## 2023-05-30 ENCOUNTER — Inpatient Hospital Stay (HOSPITAL_COMMUNITY): Payer: Medicare HMO

## 2023-05-30 DIAGNOSIS — K2970 Gastritis, unspecified, without bleeding: Secondary | ICD-10-CM | POA: Diagnosis not present

## 2023-05-30 DIAGNOSIS — N183 Chronic kidney disease, stage 3 unspecified: Secondary | ICD-10-CM | POA: Diagnosis not present

## 2023-05-30 DIAGNOSIS — I2510 Atherosclerotic heart disease of native coronary artery without angina pectoris: Secondary | ICD-10-CM | POA: Diagnosis not present

## 2023-05-30 DIAGNOSIS — I4819 Other persistent atrial fibrillation: Secondary | ICD-10-CM | POA: Diagnosis not present

## 2023-05-30 DIAGNOSIS — I2584 Coronary atherosclerosis due to calcified coronary lesion: Secondary | ICD-10-CM | POA: Diagnosis not present

## 2023-05-30 DIAGNOSIS — R7989 Other specified abnormal findings of blood chemistry: Secondary | ICD-10-CM | POA: Diagnosis not present

## 2023-05-30 DIAGNOSIS — I129 Hypertensive chronic kidney disease with stage 1 through stage 4 chronic kidney disease, or unspecified chronic kidney disease: Secondary | ICD-10-CM

## 2023-05-30 DIAGNOSIS — K8070 Calculus of gallbladder and bile duct without cholecystitis without obstruction: Secondary | ICD-10-CM

## 2023-05-30 DIAGNOSIS — K2980 Duodenitis without bleeding: Secondary | ICD-10-CM | POA: Diagnosis not present

## 2023-05-30 DIAGNOSIS — I4891 Unspecified atrial fibrillation: Secondary | ICD-10-CM

## 2023-05-30 DIAGNOSIS — Z87891 Personal history of nicotine dependence: Secondary | ICD-10-CM | POA: Diagnosis not present

## 2023-05-30 DIAGNOSIS — I10 Essential (primary) hypertension: Secondary | ICD-10-CM | POA: Diagnosis not present

## 2023-05-30 HISTORY — DX: Unspecified atrial fibrillation: I48.91

## 2023-05-30 HISTORY — PX: ENDOSCOPIC RETROGRADE CHOLANGIOPANCREATOGRAPHY (ERCP) WITH PROPOFOL: SHX5810

## 2023-05-30 HISTORY — PX: BIOPSY: SHX5522

## 2023-05-30 HISTORY — PX: PANCREATIC STENT PLACEMENT: SHX5539

## 2023-05-30 LAB — COMPREHENSIVE METABOLIC PANEL
ALT: 194 U/L — ABNORMAL HIGH (ref 0–44)
AST: 119 U/L — ABNORMAL HIGH (ref 15–41)
Albumin: 3.2 g/dL — ABNORMAL LOW (ref 3.5–5.0)
Alkaline Phosphatase: 51 U/L (ref 38–126)
Anion gap: 8 (ref 5–15)
BUN: 28 mg/dL — ABNORMAL HIGH (ref 8–23)
CO2: 29 mmol/L (ref 22–32)
Calcium: 9.6 mg/dL (ref 8.9–10.3)
Chloride: 104 mmol/L (ref 98–111)
Creatinine, Ser: 1.55 mg/dL — ABNORMAL HIGH (ref 0.61–1.24)
GFR, Estimated: 46 mL/min — ABNORMAL LOW (ref >60)
Glucose, Bld: 93 mg/dL (ref 70–99)
Potassium: 4.3 mmol/L (ref 3.5–5.1)
Sodium: 141 mmol/L (ref 135–145)
Total Bilirubin: 2.7 mg/dL — ABNORMAL HIGH (ref 0.3–1.2)
Total Protein: 6.1 g/dL — ABNORMAL LOW (ref 6.5–8.1)

## 2023-05-30 LAB — CBC
HCT: 43.3 % (ref 39.0–52.0)
Hemoglobin: 13.7 g/dL (ref 13.0–17.0)
MCH: 31 pg (ref 26.0–34.0)
MCHC: 31.6 g/dL (ref 30.0–36.0)
MCV: 98 fL (ref 80.0–100.0)
Platelets: 129 10*3/uL — ABNORMAL LOW (ref 150–400)
RBC: 4.42 MIL/uL (ref 4.22–5.81)
RDW: 13.9 % (ref 11.5–15.5)
WBC: 8.5 10*3/uL (ref 4.0–10.5)
nRBC: 0 % (ref 0.0–0.2)

## 2023-05-30 LAB — TSH: TSH: 0.746 u[IU]/mL (ref 0.350–4.500)

## 2023-05-30 SURGERY — MRI WITH ANESTHESIA
Anesthesia: General

## 2023-05-30 SURGERY — ENDOSCOPIC RETROGRADE CHOLANGIOPANCREATOGRAPHY (ERCP) WITH PROPOFOL
Anesthesia: General

## 2023-05-30 MED ORDER — LACTATED RINGERS IV SOLN: INTRAVENOUS | Status: DC

## 2023-05-30 MED ORDER — ROCURONIUM BROMIDE 10 MG/ML (PF) SYRINGE
PREFILLED_SYRINGE | INTRAVENOUS | Status: DC | PRN
  Administered 2023-05-30: 60 mg via INTRAVENOUS
  Administered 2023-05-30: 20 mg via INTRAVENOUS

## 2023-05-30 MED ORDER — DICLOFENAC SUPPOSITORY 100 MG
100.0000 mg | Freq: Once | RECTAL | Status: DC
  Filled 2023-05-30: qty 1

## 2023-05-30 MED ORDER — EPHEDRINE SULFATE-NACL 50-0.9 MG/10ML-% IV SOSY
PREFILLED_SYRINGE | INTRAVENOUS | Status: DC | PRN
  Administered 2023-05-30: 10 mg via INTRAVENOUS

## 2023-05-30 MED ORDER — AMLODIPINE BESYLATE 5 MG PO TABS
5.0000 mg | ORAL_TABLET | Freq: Every day | ORAL | Status: DC
  Administered 2023-05-30: 5 mg via ORAL
  Filled 2023-05-30: qty 1

## 2023-05-30 MED ORDER — FENTANYL CITRATE (PF) 100 MCG/2ML IJ SOLN
INTRAMUSCULAR | Status: AC
  Filled 2023-05-30: qty 2

## 2023-05-30 MED ORDER — GLUCAGON HCL RDNA (DIAGNOSTIC) 1 MG IJ SOLR
INTRAMUSCULAR | Status: AC
  Filled 2023-05-30: qty 1

## 2023-05-30 MED ORDER — LACTATED RINGERS IV SOLN: INTRAVENOUS | Status: AC

## 2023-05-30 MED ORDER — INDOMETHACIN 50 MG RE SUPP
100.0000 mg | Freq: Once | RECTAL | Status: DC
  Filled 2023-05-30 (×2): qty 2

## 2023-05-30 MED ORDER — PANTOPRAZOLE SODIUM 40 MG IV SOLR
40.0000 mg | INTRAVENOUS | Status: DC
  Administered 2023-05-30 – 2023-06-02 (×4): 40 mg via INTRAVENOUS
  Filled 2023-05-30 (×4): qty 10

## 2023-05-30 MED ORDER — DICLOFENAC SUPPOSITORY 100 MG
RECTAL | Status: DC | PRN
  Administered 2023-05-30: 100 mg via RECTAL

## 2023-05-30 MED ORDER — PROPOFOL 10 MG/ML IV BOLUS
INTRAVENOUS | Status: DC | PRN
  Administered 2023-05-30: 20 mg via INTRAVENOUS
  Administered 2023-05-30: 170 mg via INTRAVENOUS

## 2023-05-30 MED ORDER — SODIUM CHLORIDE 0.9 % IV SOLN: 2.0000 g | INTRAVENOUS | Status: DC

## 2023-05-30 MED ORDER — GLUCAGON HCL RDNA (DIAGNOSTIC) 1 MG IJ SOLR
INTRAMUSCULAR | Status: DC | PRN
  Administered 2023-05-30: .5 mg via INTRAVENOUS

## 2023-05-30 MED ORDER — LIDOCAINE 2% (20 MG/ML) 5 ML SYRINGE
INTRAMUSCULAR | Status: DC | PRN
  Administered 2023-05-30: 60 mg via INTRAVENOUS

## 2023-05-30 MED ORDER — SUGAMMADEX SODIUM 200 MG/2ML IV SOLN
INTRAVENOUS | Status: DC | PRN
  Administered 2023-05-30: 200 mg via INTRAVENOUS

## 2023-05-30 MED ORDER — SODIUM CHLORIDE 0.9 % IV SOLN
1.5000 g | Freq: Once | INTRAVENOUS | Status: AC
  Administered 2023-05-30: 1.5 g via INTRAVENOUS
  Filled 2023-05-30: qty 4

## 2023-05-30 MED ORDER — FENTANYL CITRATE (PF) 100 MCG/2ML IJ SOLN
INTRAMUSCULAR | Status: DC | PRN
  Administered 2023-05-30: 50 ug via INTRAVENOUS

## 2023-05-30 MED ORDER — INDOMETHACIN 50 MG RE SUPP
RECTAL | Status: AC
  Filled 2023-05-30: qty 2

## 2023-05-30 MED ORDER — DEXAMETHASONE SODIUM PHOSPHATE 10 MG/ML IJ SOLN
INTRAMUSCULAR | Status: DC | PRN
  Administered 2023-05-30: 5 mg via INTRAVENOUS

## 2023-05-30 MED ORDER — PHENYLEPHRINE HCL-NACL 20-0.9 MG/250ML-% IV SOLN
INTRAVENOUS | Status: DC | PRN
  Administered 2023-05-30: 25 ug/min via INTRAVENOUS

## 2023-05-30 NOTE — Interval H&P Note (Signed)
History and Physical Interval Note:  05/30/2023 1:52 PM  Alexander Howe  has presented today for surgery, with the diagnosis of choledocholithiasis.  The various methods of treatment have been discussed with the patient and family. After consideration of risks, benefits and other options for treatment, the patient has consented to  Procedure(s): ENDOSCOPIC RETROGRADE CHOLANGIOPANCREATOGRAPHY (ERCP) WITH PROPOFOL (N/A) as a surgical intervention.  The patient's history has been reviewed, patient examined, no change in status, stable for surgery.  I have reviewed the patient's chart and labs.  Questions were answered to the patient's satisfaction.     Stan Head

## 2023-05-30 NOTE — Op Note (Addendum)
[facility] Patient Name: Alexander Howe Procedure Date : 05/30/2023 MRN: [id number] Attending MD: Iva Boop , MD, [id number] Date of Birth: Jul 03, 1946 CSN: [id number] Age: 77 Admit Type: Inpatient Procedure:                ERCP Indications:              Suspected bile duct stone(s) Providers:                Iva Boop, MD, Cephus Richer, RN, Stephens Shire RN, RN, Sunday Corn Mbumina, Technician Referring MD:              Medicines:                General Anesthesia, Unasyn IV and diclofenac 100 mg                            pr Complications:            No immediate complications. Estimated Blood Loss:     Estimated blood loss: none. Procedure:                Pre-Anesthesia Assessment:                           - Prior to the procedure, a History and Physical                            was performed, and patient medications and                            allergies were reviewed. The patient's tolerance of                            previous anesthesia was also reviewed. The risks                            and benefits of the procedure and the sedation                            options and risks were discussed with the patient.                            All questions were answered, and informed consent                            was obtained. Prior Anticoagulants: The patient                            last took heparin 1 day prior to the procedure. ASA                            Grade Assessment: III - A patient with severe  systemic disease. After reviewing the risks and                            benefits, the patient was deemed in satisfactory                            condition to undergo the procedure.                           After obtaining informed consent, the scope was                            passed under direct vision. Throughout the                            procedure, the patient's blood  pressure, pulse, and                            oxygen saturations were monitored continuously. The                            [id number]-[id number] ([id number] Olympus duodenoscope was                            introduced through the mouth, and used to inject                            contrast into and used to inject contrast into the                            ventral pancreatic duct. The ERCP was performed                            with difficulty due to challenging cannulation. The                            patient tolerated the procedure well. Scope In: Scope Out: Findings:      The scout film was normal. The esophagus was successfully intubated       under direct vision. The scope was advanced to a normal major papilla in       the descending duodenum without detailed examination of the pharynx,       larynx and associated structures, and upper GI tract. The upper GI tract       was grossly normal. Stoamch and duodenum with multipe superficial       erosions - bipospies taken from antrum R/O H pylori. Esophagus not seen       well. Papilla was edematous and traumatized and eryhtematous. I coukld       see an orifice and was able to seat sphincrterotome but could only get       into pancreatic duct w/ wires. Used 035 then 025 wires. 025 wire left       and 4 Fr 3 cm single pigtail stent left in pancreatic duct. Attempted to       pass wire into CBD byt  there was edema and no room tto do so - had       previously failed w/ just wire in the pancreatic duct. No bile seen at       all during procedure. Impression:               - Attempts at a cholangiogram failed.                           - One plastic stent was placed into the ventral                            pancreatic duct.                           - Gastritis. Biopsied.                           - Duodenitis. Recommendation:           - Observe, f/u Labs                           Clears tonight and NPO in AM in case of procedure.                            Options:                           1) Go ahead w/ lap chole and do IOC and if stones                            remove later                           2) Observe and recheck MRCP and decide next steps                           3) Repeat ERCP in 2 days or so and decide next steps                           I will f/u path and will arrange KUB to check for                            passage of pancreatic stent and we will f/u tomorrow                           40 mhg pantoprazole qd - starting IV then po                           MRI in 1 year for pancreatic tail lesion                           New onset Afib noteed - per [facility]  Wife updated by phone Procedure Code(s):        --- Professional ---                           [phone], Endoscopic retrograde                            cholangiopancreatography (ERCP); with placement of                            endoscopic stent into biliary or pancreatic duct,                            including pre- and post-dilation and guide wire                            passage, when performed, including sphincterotomy,                            when performed, each stent                           [id number], 59, Endoscopic retrograde                            cholangiopancreatography (ERCP); with biopsy,                            single or multiple Diagnosis Code(s):        --- Professional ---                           K29.70, Gastritis, unspecified, without bleeding                           K29.80, Duodenitis without bleeding CPT copyright 2022 [employer]. All rights reserved. The codes documented in this report are preliminary and upon coder review may  be revised to meet current compliance requirements. Iva Boop, MD 05/30/2023 4:32:59 PM This report has been signed electronically. Number of Addenda: 0

## 2023-05-30 NOTE — Progress Notes (Signed)
PROGRESS NOTE    Alexander Howe  MVH:[id number] DOB: 1946/08/13 DOA: 05/27/2023 PCP: [name], MD   Brief Narrative:    77 year old with history of depression, anxiety, AAA and descending aortic aneurysm, hypertension, hyperlipidemia, CKD stage IIIb, obstructive sleep apnea and history of nephrolithiasis presented with abdominal pain, nausea and vomiting for 1 day. Shortly started after eating. In the emergency room he was afebrile. WC count 12.5. Creatinine 2.03. Mildly elevated transaminases. Total bilirubin 5.7, direct bilirubin 1.8. CT scan notable for calculi of the urinary bladder without obstruction, multiple low-density pancreatic lesions, cholelithiasis without acute cholecystitis with normal CBD diameter. Admitted with IV fluids, pain medications and nausea medications.  MRCP performed 7/16 with mild intra and extrahepatic biliary ductal dilation noted and CBD stones.  GI planning for ERCP today and plans for laparoscopic cholecystectomy on 7/18.  Assessment & Plan:   Principal Problem:   Elevated LFTs Active Problems:   Anxiety   Chronic kidney disease, stage 3 unspecified (HCC)   Benign essential hypertension   Depression   OSA on CPAP   Elevated bilirubin   Generalized abdominal pain   Cholelithiasis with choledocholithiasis   IPMN (intraductal papillary mucinous neoplasm)  Assessment and Plan:   Acute upper quadrant abdominal pain Symptomatic cholelithiasis, possibly passed bile duct stone Abnormal liver function test, indirect hyperbilirubinemia Abnormal pancreatic morphology on CT scan.  Currently hemodynamically stable.  Pain is controlled.  Symptoms are controlled. IV fluid, pain medications. MRCP concerning for common bile duct dilation and therefore ERCP to be performed per GI today with plans for laparoscopic cholecystectomy 7/18.   Renal calculi/urinary bladder calculi: Patient has multiple urinary bladder calculi however without urinary symptoms.   No hydronephrosis or obstruction.  Monitor for symptoms.   Chronic medical issues including Anxiety and depression: On Paxil and Xanax  Essential hypertension: On metoprolol.  Holding diuretics and ACE.  Acute kidney injury on CKD stage IIIb: Baseline about 1.7.  Maintenance fluid.  AKI appears to have resolved, follow labs in a.m.  Obstructive sleep apnea: To use CPAP at night.  Obesity BMI 32.56    DVT prophylaxis:Heparin Code Status: Full Family Communication: Multiple family at bedside 7/17 Disposition Plan: ERCP and laparoscopic cholecystectomy Status is: Inpatient Remains inpatient appropriate because: Need for IV medications and inpatient procedure   Consultants:  GI General surgery  Procedures:  None  Antimicrobials:  None   Subjective: Patient seen and evaluated today with no new acute complaints or concerns. No acute concerns or events noted overnight.  He is anxious about having his procedures.  He denies any significant pain, nausea, or vomiting.  Objective: Vitals:   05/29/23 2059 05/29/23 2121 05/30/23 0441 05/30/23 0443  BP:   121/77 121/77  Pulse:  (!) 48 (!) 48 84  Resp:   16 16  Temp:   (!) 97.5 F (36.4 C) (!) 97.5 F (36.4 C)  TempSrc:   Oral Oral  SpO2: 94%  98% 93%  Weight:      Height:       No intake or output data in the 24 hours ending 05/30/23 0636 Filed Weights   05/27/23 2033 05/28/23 1338  Weight: 116.6 kg 113.5 kg    Examination:  General exam: Appears calm and comfortable  Respiratory system: Clear to auscultation. Respiratory effort normal. Cardiovascular system: S1 & S2 heard, RRR.  Gastrointestinal system: Abdomen is soft Central nervous system: Alert and awake Extremities: No edema Skin: No significant lesions noted Psychiatry: Flat affect.  Data Reviewed: I have personally reviewed following labs and imaging studies  CBC: Recent Labs  Lab 05/27/23 2043 05/28/23 0343 05/29/23 0324 05/30/23 0016  WBC  12.5* 11.2* 9.5 8.5  HGB 16.5 16.6 14.1 13.7  HCT 50.7 50.7 43.1 43.3  MCV 96.9 98.1 94.9 98.0  PLT 166 149* 135* 129*   Basic Metabolic Panel: Recent Labs  Lab 05/27/23 2043 05/28/23 0343 05/29/23 0324 05/30/23 0016  NA 138 138 136 141  K 4.7 5.1 4.0 4.3  CL 96* 103 102 104  CO2 30 22 26 29   GLUCOSE 140* 121* 101* 93  BUN 37* 35* 32* 28*  CREATININE 2.03* 1.91* 1.61* 1.55*  CALCIUM 10.4* 9.8 9.4 9.6   GFR: Estimated Creatinine Clearance: 54 mL/min (A) (by C-G formula based on SCr of 1.55 mg/dL (H)). Liver Function Tests: Recent Labs  Lab 05/27/23 2247 05/28/23 0343 05/29/23 0324 05/30/23 0016  AST 276* 291* 182* 119*  ALT 240* 282* 251* 194*  ALKPHOS 52 53 54 51  BILITOT 5.7* 5.5* 3.3* 2.7*  PROT 7.7 6.8 6.0* 6.1*  ALBUMIN 4.2 3.5 3.3* 3.2*   Recent Labs  Lab 05/27/23 2247  LIPASE 40   No results for input(s): "AMMONIA" in the last 168 hours. Coagulation Profile: Recent Labs  Lab 05/28/23 0408  INR 1.1   Cardiac Enzymes: No results for input(s): "CKTOTAL", "CKMB", "CKMBINDEX", "TROPONINI" in the last 168 hours. BNP (last 3 results) No results for input(s): "PROBNP" in the last 8760 hours. HbA1C: No results for input(s): "HGBA1C" in the last 72 hours. CBG: No results for input(s): "GLUCAP" in the last 168 hours. Lipid Profile: No results for input(s): "CHOL", "HDL", "LDLCALC", "TRIG", "CHOLHDL", "LDLDIRECT" in the last 72 hours. Thyroid Function Tests: No results for input(s): "TSH", "T4TOTAL", "FREET4", "T3FREE", "THYROIDAB" in the last 72 hours. Anemia Panel: No results for input(s): "VITAMINB12", "FOLATE", "FERRITIN", "TIBC", "IRON", "RETICCTPCT" in the last 72 hours. Sepsis Labs: No results for input(s): "PROCALCITON", "LATICACIDVEN" in the last 168 hours.  No results found for this or any previous visit (from the past 240 hour(s)).       Radiology Studies: MR ABDOMEN MRCP W WO CONTAST  Result Date: 05/29/2023 CLINICAL DATA:  Biliary  obstruction suspected, pancreas mass, CBD obstruction EXAM: MRI ABDOMEN WITHOUT AND WITH CONTRAST (INCLUDING MRCP) TECHNIQUE: Multiplanar multisequence MR imaging of the abdomen was performed both before and after the administration of intravenous contrast. Heavily T2-weighted images of the biliary and pancreatic ducts were obtained, and three-dimensional MRCP images were rendered by post processing. CONTRAST:  10mL GADAVIST GADOBUTROL 1 MMOL/ML IV SOLN COMPARISON:  Right upper quadrant ultrasound, 05/28/2023, CT chest abdomen pelvis angiogram, 05/28/2023 FINDINGS: Lower chest: No acute abnormality. Hepatobiliary: Multiple benign liver cysts. Intrinsically T2 hyperintense lesion of the posteromedial right lobe of the liver, hepatic segment VII, with a somewhat reticular, progressive peripheral contrast enhancement pattern, measuring 3.9 x 3.1 cm (series 902, image 67). Small gallstones in the gallbladder. No gallbladder wall thickening. Mild inflammatory fat stranding and fluid in the gallbladder fossa (series 4, image 16). Mild intra and extrahepatic biliary ductal dilatation, common bile duct measuring up to 0.8 cm in caliber. Sludge and tiny gallstones in the central common bile duct near the ampulla, measuring no greater than 0.3 cm (series 3, image 31, series 2, image 21). Pancreas: Multiple simple and thinly septated fluid signal cystic lesions throughout the pancreas, largest in the dorsal pancreatic tail, communicating with the main pancreatic duct, measuring 2.3 x 2.1 cm (series 3, image  27). No solid component or suspicious contrast enhancement. No pancreatic ductal dilatation or surrounding inflammatory changes. Spleen: Normal in size without significant abnormality. Adrenals/Urinary Tract: Macroscopic fat containing, benign bilateral adrenal adenomata, requiring no specific further follow-up or characterization. Multiple simple and thinly septated benign bilateral renal cortical cysts, for which no  specific further follow-up or characterization is required. Kidneys are otherwise normal, without obvious renal calculi, solid lesion, or hydronephrosis. Stomach/Bowel: Stomach is within normal limits. No evidence of bowel wall thickening, distention, or inflammatory changes. Vascular/Lymphatic: No significant vascular findings are present. No enlarged abdominal lymph nodes. Other: No abdominal wall hernia or abnormality. No ascites. Musculoskeletal: No acute or significant osseous findings. IMPRESSION: 1. Mild intra and extrahepatic biliary ductal dilatation, common bile duct measuring up to 0.8 cm in caliber. Sludge and tiny gallstones in the central common bile duct near the ampulla, measuring no greater than 0.3 cm. 2. Cholelithiasis with mild inflammatory fat stranding and fluid in the gallbladder fossa, consistent with acute cholecystitis. 3. Multiple simple and thinly septated fluid signal cystic lesions throughout the pancreas, largest in the dorsal pancreatic tail, communicating with the main pancreatic duct, measuring 2.3 x 2.1 cm. No solid component or suspicious contrast enhancement. No pancreatic ductal dilatation or surrounding inflammatory changes. These are most consistent with side branch IPMNs. Recommend follow-up MRI/MRCP in 1 year to ensure stability. 4. Lesion of the posteromedial right lobe of the liver, hepatic segment VII, with a somewhat reticular, progressive peripheral contrast enhancement pattern, measuring 3.9 x 3.1 cm. This is most consistent with an atypical, although benign hemangioma. Attention on follow-up with above recommended examination in 1 year. Electronically Signed   By: Jearld Lesch M.D.   On: 05/29/2023 14:00   MR 3D Recon At Scanner  Result Date: 05/29/2023 CLINICAL DATA:  Biliary obstruction suspected, pancreas mass, CBD obstruction EXAM: MRI ABDOMEN WITHOUT AND WITH CONTRAST (INCLUDING MRCP) TECHNIQUE: Multiplanar multisequence MR imaging of the abdomen was  performed both before and after the administration of intravenous contrast. Heavily T2-weighted images of the biliary and pancreatic ducts were obtained, and three-dimensional MRCP images were rendered by post processing. CONTRAST:  10mL GADAVIST GADOBUTROL 1 MMOL/ML IV SOLN COMPARISON:  Right upper quadrant ultrasound, 05/28/2023, CT chest abdomen pelvis angiogram, 05/28/2023 FINDINGS: Lower chest: No acute abnormality. Hepatobiliary: Multiple benign liver cysts. Intrinsically T2 hyperintense lesion of the posteromedial right lobe of the liver, hepatic segment VII, with a somewhat reticular, progressive peripheral contrast enhancement pattern, measuring 3.9 x 3.1 cm (series 902, image 67). Small gallstones in the gallbladder. No gallbladder wall thickening. Mild inflammatory fat stranding and fluid in the gallbladder fossa (series 4, image 16). Mild intra and extrahepatic biliary ductal dilatation, common bile duct measuring up to 0.8 cm in caliber. Sludge and tiny gallstones in the central common bile duct near the ampulla, measuring no greater than 0.3 cm (series 3, image 31, series 2, image 21). Pancreas: Multiple simple and thinly septated fluid signal cystic lesions throughout the pancreas, largest in the dorsal pancreatic tail, communicating with the main pancreatic duct, measuring 2.3 x 2.1 cm (series 3, image 27). No solid component or suspicious contrast enhancement. No pancreatic ductal dilatation or surrounding inflammatory changes. Spleen: Normal in size without significant abnormality. Adrenals/Urinary Tract: Macroscopic fat containing, benign bilateral adrenal adenomata, requiring no specific further follow-up or characterization. Multiple simple and thinly septated benign bilateral renal cortical cysts, for which no specific further follow-up or characterization is required. Kidneys are otherwise normal, without obvious renal calculi, solid lesion, or  hydronephrosis. Stomach/Bowel: Stomach is within  normal limits. No evidence of bowel wall thickening, distention, or inflammatory changes. Vascular/Lymphatic: No significant vascular findings are present. No enlarged abdominal lymph nodes. Other: No abdominal wall hernia or abnormality. No ascites. Musculoskeletal: No acute or significant osseous findings. IMPRESSION: 1. Mild intra and extrahepatic biliary ductal dilatation, common bile duct measuring up to 0.8 cm in caliber. Sludge and tiny gallstones in the central common bile duct near the ampulla, measuring no greater than 0.3 cm. 2. Cholelithiasis with mild inflammatory fat stranding and fluid in the gallbladder fossa, consistent with acute cholecystitis. 3. Multiple simple and thinly septated fluid signal cystic lesions throughout the pancreas, largest in the dorsal pancreatic tail, communicating with the main pancreatic duct, measuring 2.3 x 2.1 cm. No solid component or suspicious contrast enhancement. No pancreatic ductal dilatation or surrounding inflammatory changes. These are most consistent with side branch IPMNs. Recommend follow-up MRI/MRCP in 1 year to ensure stability. 4. Lesion of the posteromedial right lobe of the liver, hepatic segment VII, with a somewhat reticular, progressive peripheral contrast enhancement pattern, measuring 3.9 x 3.1 cm. This is most consistent with an atypical, although benign hemangioma. Attention on follow-up with above recommended examination in 1 year. Electronically Signed   By: Jearld Lesch M.D.   On: 05/29/2023 14:00        Scheduled Meds:  LORazepam  1 mg Oral Once   metoprolol succinate  100 mg Oral QHS   PARoxetine  10 mg Oral Daily   sodium chloride flush  3 mL Intravenous Q12H     LOS: 2 days    Time spent: 35 minutes    Charlestine Rookstool Hoover Brunette, DO [facility]  If 7PM-7AM, please contact night-coverage [email or web address] 05/30/2023, 6:36 AM

## 2023-05-30 NOTE — Progress Notes (Addendum)
Subjective: CC: Abdominal pain resolved. No n/v. NPO for ERCP.   Afebrile. No tachycardia or hypotension. WBC wnl. LFT's downtrending with T. BIli 5.5 > 3.3 > 2.7  His daughter is at bedside.   Objective: Vital signs in last 24 hours: Temp:  [97.5 F (36.4 C)-97.7 F (36.5 C)] 97.5 F (36.4 C) (07/17 0443) Pulse Rate:  [48-84] 84 (07/17 0443) Resp:  [16] 16 (07/17 0443) BP: (121-166)/(69-98) 121/77 (07/17 0443) SpO2:  [93 %-99 %] 93 % (07/17 0443) Weight:  [107.3 kg] 107.3 kg (07/17 0646) Last BM Date : 05/27/23  Intake/Output from previous day: No intake/output data recorded. Intake/Output this shift: No intake/output data recorded.  PE: Gen:  Alert, NAD, pleasant Abd: Soft, ND, NT  Lab Results:  Recent Labs    05/29/23 0324 05/30/23 0016  WBC 9.5 8.5  HGB 14.1 13.7  HCT 43.1 43.3  PLT 135* 129*   BMET Recent Labs    05/29/23 0324 05/30/23 0016  NA 136 141  K 4.0 4.3  CL 102 104  CO2 26 29  GLUCOSE 101* 93  BUN 32* 28*  CREATININE 1.61* 1.55*  CALCIUM 9.4 9.6   PT/INR Recent Labs    05/28/23 0408  LABPROT 14.4  INR 1.1   CMP     Component Value Date/Time   NA 141 05/30/2023 0016   NA 138 10/18/2012 0339   K 4.3 05/30/2023 0016   K 3.7 10/18/2012 0339   CL 104 05/30/2023 0016   CL 105 10/18/2012 0339   CO2 29 05/30/2023 0016   CO2 30 10/18/2012 0339   GLUCOSE 93 05/30/2023 0016   GLUCOSE 132 (H) 10/18/2012 0339   BUN 28 (H) 05/30/2023 0016   BUN 20 (H) 10/18/2012 0339   CREATININE 1.55 (H) 05/30/2023 0016   CREATININE 1.30 10/18/2012 0339   CALCIUM 9.6 05/30/2023 0016   CALCIUM 8.4 (L) 10/18/2012 0339   PROT 6.1 (L) 05/30/2023 0016   ALBUMIN 3.2 (L) 05/30/2023 0016   AST 119 (H) 05/30/2023 0016   ALT 194 (H) 05/30/2023 0016   ALKPHOS 51 05/30/2023 0016   BILITOT 2.7 (H) 05/30/2023 0016   GFRNONAA 46 (L) 05/30/2023 0016   GFRNONAA 57 (L) 10/18/2012 0339   GFRAA >60 12/29/2015 0544   GFRAA >60 10/18/2012 0339   Lipase      Component Value Date/Time   LIPASE 40 05/27/2023 2247    Studies/Results: MR ABDOMEN MRCP W WO CONTAST  Result Date: 05/29/2023 CLINICAL DATA:  Biliary obstruction suspected, pancreas mass, CBD obstruction EXAM: MRI ABDOMEN WITHOUT AND WITH CONTRAST (INCLUDING MRCP) TECHNIQUE: Multiplanar multisequence MR imaging of the abdomen was performed both before and after the administration of intravenous contrast. Heavily T2-weighted images of the biliary and pancreatic ducts were obtained, and three-dimensional MRCP images were rendered by post processing. CONTRAST:  10mL GADAVIST GADOBUTROL 1 MMOL/ML IV SOLN COMPARISON:  Right upper quadrant ultrasound, 05/28/2023, CT chest abdomen pelvis angiogram, 05/28/2023 FINDINGS: Lower chest: No acute abnormality. Hepatobiliary: Multiple benign liver cysts. Intrinsically T2 hyperintense lesion of the posteromedial right lobe of the liver, hepatic segment VII, with a somewhat reticular, progressive peripheral contrast enhancement pattern, measuring 3.9 x 3.1 cm (series 902, image 67). Small gallstones in the gallbladder. No gallbladder wall thickening. Mild inflammatory fat stranding and fluid in the gallbladder fossa (series 4, image 16). Mild intra and extrahepatic biliary ductal dilatation, common bile duct measuring up to 0.8 cm in caliber. Sludge and tiny gallstones in the central common  bile duct near the ampulla, measuring no greater than 0.3 cm (series 3, image 31, series 2, image 21). Pancreas: Multiple simple and thinly septated fluid signal cystic lesions throughout the pancreas, largest in the dorsal pancreatic tail, communicating with the main pancreatic duct, measuring 2.3 x 2.1 cm (series 3, image 27). No solid component or suspicious contrast enhancement. No pancreatic ductal dilatation or surrounding inflammatory changes. Spleen: Normal in size without significant abnormality. Adrenals/Urinary Tract: Macroscopic fat containing, benign bilateral adrenal  adenomata, requiring no specific further follow-up or characterization. Multiple simple and thinly septated benign bilateral renal cortical cysts, for which no specific further follow-up or characterization is required. Kidneys are otherwise normal, without obvious renal calculi, solid lesion, or hydronephrosis. Stomach/Bowel: Stomach is within normal limits. No evidence of bowel wall thickening, distention, or inflammatory changes. Vascular/Lymphatic: No significant vascular findings are present. No enlarged abdominal lymph nodes. Other: No abdominal wall hernia or abnormality. No ascites. Musculoskeletal: No acute or significant osseous findings. IMPRESSION: 1. Mild intra and extrahepatic biliary ductal dilatation, common bile duct measuring up to 0.8 cm in caliber. Sludge and tiny gallstones in the central common bile duct near the ampulla, measuring no greater than 0.3 cm. 2. Cholelithiasis with mild inflammatory fat stranding and fluid in the gallbladder fossa, consistent with acute cholecystitis. 3. Multiple simple and thinly septated fluid signal cystic lesions throughout the pancreas, largest in the dorsal pancreatic tail, communicating with the main pancreatic duct, measuring 2.3 x 2.1 cm. No solid component or suspicious contrast enhancement. No pancreatic ductal dilatation or surrounding inflammatory changes. These are most consistent with side branch IPMNs. Recommend follow-up MRI/MRCP in 1 year to ensure stability. 4. Lesion of the posteromedial right lobe of the liver, hepatic segment VII, with a somewhat reticular, progressive peripheral contrast enhancement pattern, measuring 3.9 x 3.1 cm. This is most consistent with an atypical, although benign hemangioma. Attention on follow-up with above recommended examination in 1 year. Electronically Signed   By: Jearld Lesch M.D.   On: 05/29/2023 14:00   MR 3D Recon At Scanner  Result Date: 05/29/2023 CLINICAL DATA:  Biliary obstruction suspected,  pancreas mass, CBD obstruction EXAM: MRI ABDOMEN WITHOUT AND WITH CONTRAST (INCLUDING MRCP) TECHNIQUE: Multiplanar multisequence MR imaging of the abdomen was performed both before and after the administration of intravenous contrast. Heavily T2-weighted images of the biliary and pancreatic ducts were obtained, and three-dimensional MRCP images were rendered by post processing. CONTRAST:  10mL GADAVIST GADOBUTROL 1 MMOL/ML IV SOLN COMPARISON:  Right upper quadrant ultrasound, 05/28/2023, CT chest abdomen pelvis angiogram, 05/28/2023 FINDINGS: Lower chest: No acute abnormality. Hepatobiliary: Multiple benign liver cysts. Intrinsically T2 hyperintense lesion of the posteromedial right lobe of the liver, hepatic segment VII, with a somewhat reticular, progressive peripheral contrast enhancement pattern, measuring 3.9 x 3.1 cm (series 902, image 67). Small gallstones in the gallbladder. No gallbladder wall thickening. Mild inflammatory fat stranding and fluid in the gallbladder fossa (series 4, image 16). Mild intra and extrahepatic biliary ductal dilatation, common bile duct measuring up to 0.8 cm in caliber. Sludge and tiny gallstones in the central common bile duct near the ampulla, measuring no greater than 0.3 cm (series 3, image 31, series 2, image 21). Pancreas: Multiple simple and thinly septated fluid signal cystic lesions throughout the pancreas, largest in the dorsal pancreatic tail, communicating with the main pancreatic duct, measuring 2.3 x 2.1 cm (series 3, image 27). No solid component or suspicious contrast enhancement. No pancreatic ductal dilatation or surrounding inflammatory changes. Spleen: Normal  in size without significant abnormality. Adrenals/Urinary Tract: Macroscopic fat containing, benign bilateral adrenal adenomata, requiring no specific further follow-up or characterization. Multiple simple and thinly septated benign bilateral renal cortical cysts, for which no specific further follow-up  or characterization is required. Kidneys are otherwise normal, without obvious renal calculi, solid lesion, or hydronephrosis. Stomach/Bowel: Stomach is within normal limits. No evidence of bowel wall thickening, distention, or inflammatory changes. Vascular/Lymphatic: No significant vascular findings are present. No enlarged abdominal lymph nodes. Other: No abdominal wall hernia or abnormality. No ascites. Musculoskeletal: No acute or significant osseous findings. IMPRESSION: 1. Mild intra and extrahepatic biliary ductal dilatation, common bile duct measuring up to 0.8 cm in caliber. Sludge and tiny gallstones in the central common bile duct near the ampulla, measuring no greater than 0.3 cm. 2. Cholelithiasis with mild inflammatory fat stranding and fluid in the gallbladder fossa, consistent with acute cholecystitis. 3. Multiple simple and thinly septated fluid signal cystic lesions throughout the pancreas, largest in the dorsal pancreatic tail, communicating with the main pancreatic duct, measuring 2.3 x 2.1 cm. No solid component or suspicious contrast enhancement. No pancreatic ductal dilatation or surrounding inflammatory changes. These are most consistent with side branch IPMNs. Recommend follow-up MRI/MRCP in 1 year to ensure stability. 4. Lesion of the posteromedial right lobe of the liver, hepatic segment VII, with a somewhat reticular, progressive peripheral contrast enhancement pattern, measuring 3.9 x 3.1 cm. This is most consistent with an atypical, although benign hemangioma. Attention on follow-up with above recommended examination in 1 year. Electronically Signed   By: Jearld Lesch M.D.   On: 05/29/2023 14:00    Anti-infectives: Anti-infectives (From admission, onward)    None        Assessment/Plan Cholelithiasis with choledocholithiasis, transaminitis and hyperbilirubinemia  - MRCP w/ choledocholithiasis. GI planning ERCP today.  - RUQ [place] with CBD upper limit of normal, no evidence  of acute cholecystitis. MRCP does suggest possible Cholecystitis now with mild inflammatory fat stranding and fluid in the gallbladder fossa. Suspect this is likely reactive given he is afebrile, wbc wnl, he is without any abdominal pain and he is completely NT on exam. Plan OR in the AM (after ERCP today).  -  I have explained the procedure, risks, and aftercare of Laparoscopic cholecystectomy.  Risks include but are not limited to anesthesia (MI, CVA, death, prolonged intubation and aspiration), bleeding, infection, wound problems, hernia, bile leak, injury to common bile duct/liver/intestine, possible need for subtotal cholecystectomy or open cholecystectomy, increased risk of DVT/PE and diarrhea post op.  He seems to understand and agrees to proceed.  FEN - NPO for ERCP. Okay for diet after. Please ensure NPO at midnight. IVF per [facility] VTE - SCDs, subcutaneous heparin ID - Rocephin on call to OR  Per [facility] Bladder calculi  Dilated ascending aorta CKD stage III HTN HLD Melanoma of the skin Hx of rheumatic fever OSA Peripheral vascular disease Depression/anxiety  Pancreatic lesion - MRCP w/ multiple simple and thinly septated fluid signal cystic lesions throughout the pancreas, largest in the dorsal pancreatic tail, communicating with the main pancreatic duct, measuring 2.3 x 2.1 cm. No solid component or suspicious contrast enhancement. No pancreatic ductal dilatation or surrounding inflammatory changes. Radiology felt these are most consistent with side branch IPMNs and recommend follow-up MRI/MRCP in 1 year to ensure stability. GI is following.   MRCP also showed lesion of the posteromedial right lobe of the liver, hepatic segment VII, with a somewhat reticular, progressive peripheral contrast enhancement pattern, measuring  3.9 x 3.1 cm. Radiology felt this most consistent with an atypical, although benign hemangioma. They recommend follow-up examination in 1 year. Do not suspect we would do  intra-op bx but will review with MD.   I reviewed nursing notes, Consultant (GI) notes, hospitalist notes, last 24 h vitals and pain scores, last 48 h intake and output, last 24 h labs and trends, and last 24 h imaging results.   LOS: 2 days    Jacinto Halim , [facility] 05/30/2023, 7:57 AM Please see Amion for pager number during day hours 7:00am-4:30pm

## 2023-05-30 NOTE — Transfer of Care (Signed)
Immediate Anesthesia Transfer of Care Note  Patient: Alexander Howe  Procedure(s) Performed: ENDOSCOPIC RETROGRADE CHOLANGIOPANCREATOGRAPHY (ERCP) WITH PROPOFOL PANCREATIC STENT PLACEMENT BIOPSY  Patient Location: PACU and Endoscopy Unit  Anesthesia Type:General  Level of Consciousness: awake and patient cooperative  Airway & Oxygen Therapy: Patient Spontanous Breathing and Patient connected to face mask oxygen  Post-op Assessment: Report given to RN and Post -op Vital signs reviewed and stable  Post vital signs: Reviewed and stable  Last Vitals:  Vitals Value Taken Time  BP 161/82 05/30/23 1627  Temp    Pulse 58 05/30/23 1628  Resp    SpO2 100 % 05/30/23 1628  Vitals shown include unfiled device data.  Last Pain:  Vitals:   05/30/23 1340  TempSrc:   PainSc: 0-No pain         Complications: No notable events documented.

## 2023-05-30 NOTE — Anesthesia Postprocedure Evaluation (Signed)
Anesthesia Post Note  Patient: Alexander Howe  Procedure(s) Performed: ENDOSCOPIC RETROGRADE CHOLANGIOPANCREATOGRAPHY (ERCP) WITH PROPOFOL PANCREATIC STENT PLACEMENT BIOPSY     Patient location during evaluation: PACU Anesthesia Type: General Level of consciousness: awake and alert and oriented Pain management: pain level controlled Vital Signs Assessment: post-procedure vital signs reviewed and stable Respiratory status: spontaneous breathing, nonlabored ventilation and respiratory function stable Cardiovascular status: blood pressure returned to baseline and stable Postop Assessment: no apparent nausea or vomiting Anesthetic complications: no   No notable events documented.  Last Vitals:  Vitals:   05/30/23 1629 05/30/23 1640  BP: (!) 157/96 (!) 158/90  Pulse: (!) 55 62  Resp: (!) 0 14  Temp: 36.4 C   SpO2: 100% 97%    Last Pain:  Vitals:   05/30/23 1650  TempSrc:   PainSc: 0-No pain                 [name] A.

## 2023-05-30 NOTE — Consult Note (Signed)
Cardiology Consultation   Patient ID: Alexander Howe MRN: [id number]; DOB: 1946-10-21  Admit date: 05/27/2023 Date of Consult: 05/30/2023  PCP:  Alexander Ferrier, MD   [facility] Providers Cardiologist:  None        Patient Profile:   Alexander Howe is a 77 y.o. male with a hx of AAA, HTN, HLD, CKD stage 3b, OSA who is being seen 05/30/2023 for the evaluation of atrial fibrillation and pre op clearance at the request of Dr Sherryll Burger.  History of Present Illness:   Alexander Howe has a history of AAA, descending aortic aneurysm followed at [facility]. Prior history of HTN, HLD, CKD stage 3b, and OSA. Over the past weekend he developed refractory N/V associated with abdominal pain radiating into his back. He had elevated LFTs. No fever. Came to hospital and has been diagnosed with acute cholelithiasis with retained stones. Today underwent ERCP today with placement of stent in pancreatic duct. During procedure patient developed Afib with controlled response. This is asymptomatic. Plan for Lap choly tomorrow.   Patient denies any prior cardiac problems. No history of MI, CHF, stents, arrhythmia. He currently denies any palpitations with the Afib. Prior to this admission he  has been active walking his dog, doing yard work without any problems of chest pain or dyspnea. No prior CVA. No bleeding history. States he had rheumatic fever as a young man but never had valvular disease. He did have an Echo at [facility] in November 2023 showing mild AI, normal LV function with moderate LVH, LAE.    Past Medical History:  Diagnosis Date   AAA (abdominal aortic aneurysm) without rupture (HCC)    Abdominal aneurysm (HCC)    3.5 cm   Actinic keratosis 08/26/2020   right lateral chest   Anxiety    Atrial fibrillation (HCC) 05/30/2023   Basal cell carcinoma 09/18/2018   left upper back/one excised, one superficial   Basal cell carcinoma 11/28/2018   left post shoulder   Basal cell carcinoma 08/26/2020    left medial pretibial, EDC 10/2020   Basal cell carcinoma 03/15/2022   right neck, EDC   Basal cell carcinoma (BCC) 08/31/2022   Posterior base of neck superficial. 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   right lateral chest, EDC 12/09/20   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(HCC)    Venous stasis    Venous stasis    Vitamin B 12 deficiency     Past Surgical History:  Procedure Laterality Date   COLONOSCOPY W/ POLYPECTOMY     COLONOSCOPY WITH PROPOFOL N/A 04/05/2018   Procedure: COLONOSCOPY WITH PROPOFOL;  Surgeon: Scot Jun, MD;  Location: [facility] ENDOSCOPY;  Service: Endoscopy;  Laterality: N/A;   COLOSTOMY     COLOSTOMY REVERSAL     CYST REMOVAL LEG     KNEE ARTHROPLASTY Right 12/27/2015   Procedure: COMPUTER ASSISTED TOTAL KNEE ARTHROPLASTY;  Surgeon: Donato Heinz, MD;  Location: [facility] ORS;  Service: Orthopedics;  Laterality: Right;   KNEE ARTHROSCOPY Left    LIPOMA EXCISION     right thigh   LITHOTRIPSY     TOTAL KNEE ARTHROPLASTY Left 10/16/2012     Home Medications:  Prior to Admission medications   Medication Sig Start Date End Date Taking? Authorizing Provider  albuterol (VENTOLIN HFA) 108 (90 Base) MCG/ACT inhaler Inhale 2 puffs into the lungs every 6 (six) hours as needed for shortness of breath. 07/29/20  Yes [provider]  ALPRAZolam Prudy Feeler) 0.5 MG tablet Take 0.5 mg by mouth 2 (two) times daily as needed for anxiety.   Yes [provider]  amLODipine (NORVASC) 5 MG tablet Take 5 mg by mouth daily.   Yes [provider]  aspirin EC 81 MG tablet Take 81 mg  by mouth daily.   Yes [provider]  atorvastatin (LIPITOR) 80 MG tablet Take 80 mg by mouth daily.   Yes [provider]  Cholecalciferol (VITAMIN D3) 2000 units TABS Take 1 tablet by mouth daily.   Yes [provider]  docusate sodium (COLACE) 100 MG capsule Take 200 mg by mouth daily.   Yes [provider]  furosemide (LASIX) 20 MG tablet Take 20 mg by mouth daily as needed.   Yes [provider]  metoprolol succinate (TOPROL-XL) 100 MG 24 hr tablet Take 100 mg by mouth at bedtime. Take with or immediately following a meal.   Yes [provider]  Omega-3 Fatty Acids (FISH OIL CONCENTRATE PO) Take 1 capsule by mouth daily.   Yes [provider]  PARoxetine (PAXIL) 10 MG tablet Take 10 mg by mouth daily.   Yes [provider]  quinapril (ACCUPRIL) 40 MG tablet Take 40 mg by mouth daily.   Yes [provider]  pantoprazole (PROTONIX) 40 MG tablet Take 40 mg by mouth daily. Patient not taking: Reported on 05/28/2023    [provider]    Inpatient Medications: Scheduled Meds:  amLODipine  5 mg Oral Daily   diclofenac  100 mg Rectal Once   LORazepam  1 mg Oral Once   metoprolol succinate  100 mg Oral QHS   pantoprazole (PROTONIX) IV  40 mg Intravenous Q24H   PARoxetine  10 mg Oral Daily   sodium chloride flush  3 mL Intravenous Q12H   Continuous Infusions:  cefTRIAXone (ROCEPHIN)  IV     PRN Meds: acetaminophen, ALPRAZolam, fentaNYL (SUBLIMAZE) injection, ondansetron **OR** ondansetron (ZOFRAN) IV  Allergies:    Allergies  Allergen Reactions   Pneumococcal Vaccines Swelling    Social History:   Social History   Socioeconomic History   Marital status: Married    Spouse name: Not on file   Number of children: Not on file   Years of education: Not on file   Highest education level: Not on file  Occupational History   Not on file  Tobacco Use   Smoking status: Former   Smokeless  tobacco: Former    Types: Chew    Quit date: 11/29/1990  Vaping Use   Vaping status: Never Used  Substance and Sexual Activity   Alcohol use: Yes    Comment: rare   Drug use: Never   Sexual activity: Yes  Other Topics Concern   Not on file  Social History Narrative   Not on file   Social Determinants of Health   Financial Resource Strain: Low Risk  (01/24/2023)   Received from [facility]   Overall Financial Resource Strain (CARDIA)    Difficulty of Paying Living Expenses: Not hard at all  Food Insecurity: No Food Insecurity (05/28/2023)   Hunger Vital Sign    Worried About Running Out of Food in the Last Year: Never true    Ran Out of Food in the Last Year: Never true  Transportation Needs: No Transportation Needs (05/28/2023)   PRAPARE - Administrator, Civil Service (Medical): No    Lack of Transportation (Non-Medical): No  Physical Activity: Not on file  Stress: Not on file  Social Connections: Not on file  Intimate Partner Violence: Not At Risk (05/28/2023)   Humiliation, Afraid, Rape, and Kick questionnaire    Fear of Current or Ex-Partner: No    Emotionally Abused: No    Physically Abused: No    Sexually Abused: No    Family History:   Mother with history of cancer and DM Father with history of aneurysm  ROS:  Please see the history of present illness.  All other ROS reviewed and negative.     Physical Exam/Data:   Vitals:   05/30/23 1340 05/30/23 1629 05/30/23 1640 05/30/23 1712  BP: (!) 171/87 (!) 157/96 (!) 158/90 (!) 158/86  Pulse: 60 (!) 55 62 (!) 54  Resp: 17 (!) 0 14   Temp:  97.6 F (36.4 C)  (!) 97.4 F (36.3 C)  TempSrc:  Temporal  Oral  SpO2: 98% 100% 97% 95%  Weight:      Height:        Intake/Output Summary (Last 24 hours) at 05/30/2023 1840 Last data filed at 05/30/2023 1622 Gross per 24 hour  Intake 850 ml  Output --  Net 850 ml      05/30/2023    1:21 PM 05/30/2023     6:46 AM 05/28/2023    1:38 PM  Last 3 Weights  Weight (lbs) 250 lb 236 lb 8.9 oz 250 lb 3.2 oz  Weight (kg) 113.399 kg 107.3 kg 113.49 kg     Body mass index is 32.98 kg/m.  General:  Well nourished, overweight, in no acute distress HEENT: normal Neck: no JVD Vascular: No carotid bruits; Distal pulses 2+ bilaterally Cardiac: IRR normal S1, S2; RRR; no murmur  Lungs:  clear to auscultation bilaterally, no wheezing, rhonchi or rales  Abd: soft, nontender, no hepatomegaly  Ext: no edema Musculoskeletal:  No deformities, BUE and BLE strength normal and equal Skin: warm and dry  Neuro:  CNs 2-12 intact, no focal abnormalities noted Psych:  Normal affect   EKG:  The EKG was personally reviewed and demonstrates:  Afib with rate 58. LVH, LAD. Afib new since 2017.  Was in [facility]R [facility] in 2021 Telemetry:  Telemetry was personally reviewed and demonstrates:  pending  Relevant CV Studies: See Echo above  Also CT chest shows extensive coronary calcification.   Laboratory Data:  High Sensitivity Troponin:  No results for input(s): "TROPONINIHS" in the last 720 hours.   Chemistry Recent Labs  Lab 05/28/23 0343 05/29/23 0324 05/30/23 0016  NA 138 136 141  K 5.1 4.0 4.3  CL 103 102 104  CO2 22 26 29   GLUCOSE 121* 101* 93  BUN 35* 32* 28*  CREATININE 1.91* 1.61* 1.55*  CALCIUM 9.8 9.4 9.6  GFRNONAA 36* 44* 46*  ANIONGAP 13 8 8     Recent Labs  Lab 05/28/23 0343 05/29/23 0324 05/30/23 0016  PROT 6.8 6.0* 6.1*  ALBUMIN 3.5 3.3* 3.2*  AST 291* 182* 119*  ALT 282* 251* 194*  ALKPHOS 53 54 51  BILITOT 5.5* 3.3* 2.7*   Lipids No results for input(s): "CHOL", "TRIG", "HDL", "LABVLDL", "LDLCALC", "CHOLHDL" in the last 168 hours.  Hematology Recent Labs  Lab 05/28/23 0343 05/29/23 0324 05/30/23 0016  WBC 11.2* 9.5 8.5  RBC 5.17 4.54 4.42  HGB 16.6 14.1 13.7  HCT 50.7 43.1 43.3  MCV 98.1 94.9 98.0  MCH 32.1 31.1 31.0  MCHC 32.7 32.7 31.6  RDW 13.9 14.0 13.9  PLT 149* 135*  129*   Thyroid  Recent Labs  Lab 05/30/23 0016  TSH 0.746    BNPNo results for input(s): "BNP", "PROBNP" in the last 168 hours.  DDimer No results for input(s): "DDIMER" in the last 168 hours.   Radiology/Studies:  DG C-Arm 1-60 Min-No Report  Result Date: 05/30/2023 Fluoroscopy was utilized by the requesting physician.  No radiographic interpretation.   MR ABDOMEN MRCP W WO CONTAST  Result Date: 05/29/2023 CLINICAL DATA:  Biliary obstruction suspected, pancreas mass, CBD obstruction EXAM: MRI ABDOMEN WITHOUT AND WITH CONTRAST (INCLUDING MRCP) TECHNIQUE: Multiplanar multisequence MR imaging of the abdomen was performed both before and after the administration of intravenous contrast. Heavily T2-weighted images of the biliary and pancreatic ducts were obtained, and three-dimensional MRCP images were rendered by post processing. CONTRAST:  10mL GADAVIST GADOBUTROL 1 MMOL/ML IV SOLN COMPARISON:  Right upper quadrant ultrasound, 05/28/2023, CT chest abdomen pelvis angiogram, 05/28/2023 FINDINGS: Lower chest: No acute abnormality. Hepatobiliary: Multiple benign liver cysts. Intrinsically T2 hyperintense lesion of the posteromedial right lobe of the liver, hepatic segment VII, with a somewhat reticular, progressive peripheral contrast enhancement pattern, measuring 3.9 x 3.1 cm (series 902, image 67). Small gallstones in the gallbladder. No gallbladder wall thickening. Mild inflammatory fat stranding and fluid in the gallbladder fossa (series 4, image 16). Mild intra and extrahepatic biliary ductal dilatation, common bile duct measuring up to 0.8 cm in caliber. Sludge and tiny gallstones in the central common bile duct near the ampulla, measuring no greater than 0.3 cm (series 3, image 31, series 2, image 21). Pancreas: Multiple simple and thinly septated fluid signal cystic lesions throughout the pancreas, largest in the dorsal pancreatic tail, communicating with the main pancreatic duct, measuring 2.3  x 2.1 cm (series 3, image 27). No solid component or suspicious contrast enhancement. No pancreatic ductal dilatation or surrounding inflammatory changes. Spleen: Normal in size without significant abnormality. Adrenals/Urinary Tract: Macroscopic fat containing, benign bilateral adrenal adenomata, requiring no specific further follow-up or characterization. Multiple simple and thinly septated benign bilateral renal cortical cysts, for which no specific further follow-up or characterization is required. Kidneys are otherwise normal, without obvious renal calculi, solid lesion, or hydronephrosis. Stomach/Bowel: Stomach is within normal limits. No evidence of bowel wall thickening, distention, or inflammatory changes. Vascular/Lymphatic: No significant vascular findings are present. No enlarged abdominal lymph nodes. Other: No abdominal wall hernia or abnormality. No ascites. Musculoskeletal: No acute or significant osseous findings. IMPRESSION: 1. Mild intra and extrahepatic biliary ductal dilatation, common bile duct measuring up to 0.8 cm in caliber. Sludge and tiny gallstones in the central common bile duct near the ampulla, measuring no greater than 0.3 cm. 2. Cholelithiasis with mild inflammatory fat stranding and fluid in the gallbladder fossa, consistent with acute cholecystitis. 3. Multiple simple and thinly septated fluid signal cystic lesions throughout the pancreas, largest in the dorsal pancreatic tail, communicating with the main pancreatic duct, measuring 2.3 x 2.1 cm. No solid component or suspicious  contrast enhancement. No pancreatic ductal dilatation or surrounding inflammatory changes. These are most consistent with side branch IPMNs. Recommend follow-up MRI/MRCP in 1 year to ensure stability. 4. Lesion of the posteromedial right lobe of the liver, hepatic segment VII, with a somewhat reticular, progressive peripheral contrast enhancement pattern, measuring 3.9 x 3.1 cm. This is most consistent with  an atypical, although benign hemangioma. Attention on follow-up with above recommended examination in 1 year. Electronically Signed   By: Jearld Lesch M.D.   On: 05/29/2023 14:00   MR 3D Recon At Scanner  Result Date: 05/29/2023 CLINICAL DATA:  Biliary obstruction suspected, pancreas mass, CBD obstruction EXAM: MRI ABDOMEN WITHOUT AND WITH CONTRAST (INCLUDING MRCP) TECHNIQUE: Multiplanar multisequence MR imaging of the abdomen was performed both before and after the administration of intravenous contrast. Heavily T2-weighted images of the biliary and pancreatic ducts were obtained, and three-dimensional MRCP images were rendered by post processing. CONTRAST:  10mL GADAVIST GADOBUTROL 1 MMOL/ML IV SOLN COMPARISON:  Right upper quadrant ultrasound, 05/28/2023, CT chest abdomen pelvis angiogram, 05/28/2023 FINDINGS: Lower chest: No acute abnormality. Hepatobiliary: Multiple benign liver cysts. Intrinsically T2 hyperintense lesion of the posteromedial right lobe of the liver, hepatic segment VII, with a somewhat reticular, progressive peripheral contrast enhancement pattern, measuring 3.9 x 3.1 cm (series 902, image 67). Small gallstones in the gallbladder. No gallbladder wall thickening. Mild inflammatory fat stranding and fluid in the gallbladder fossa (series 4, image 16). Mild intra and extrahepatic biliary ductal dilatation, common bile duct measuring up to 0.8 cm in caliber. Sludge and tiny gallstones in the central common bile duct near the ampulla, measuring no greater than 0.3 cm (series 3, image 31, series 2, image 21). Pancreas: Multiple simple and thinly septated fluid signal cystic lesions throughout the pancreas, largest in the dorsal pancreatic tail, communicating with the main pancreatic duct, measuring 2.3 x 2.1 cm (series 3, image 27). No solid component or suspicious contrast enhancement. No pancreatic ductal dilatation or surrounding inflammatory changes. Spleen: Normal in size without  significant abnormality. Adrenals/Urinary Tract: Macroscopic fat containing, benign bilateral adrenal adenomata, requiring no specific further follow-up or characterization. Multiple simple and thinly septated benign bilateral renal cortical cysts, for which no specific further follow-up or characterization is required. Kidneys are otherwise normal, without obvious renal calculi, solid lesion, or hydronephrosis. Stomach/Bowel: Stomach is within normal limits. No evidence of bowel wall thickening, distention, or inflammatory changes. Vascular/Lymphatic: No significant vascular findings are present. No enlarged abdominal lymph nodes. Other: No abdominal wall hernia or abnormality. No ascites. Musculoskeletal: No acute or significant osseous findings. IMPRESSION: 1. Mild intra and extrahepatic biliary ductal dilatation, common bile duct measuring up to 0.8 cm in caliber. Sludge and tiny gallstones in the central common bile duct near the ampulla, measuring no greater than 0.3 cm. 2. Cholelithiasis with mild inflammatory fat stranding and fluid in the gallbladder fossa, consistent with acute cholecystitis. 3. Multiple simple and thinly septated fluid signal cystic lesions throughout the pancreas, largest in the dorsal pancreatic tail, communicating with the main pancreatic duct, measuring 2.3 x 2.1 cm. No solid component or suspicious contrast enhancement. No pancreatic ductal dilatation or surrounding inflammatory changes. These are most consistent with side branch IPMNs. Recommend follow-up MRI/MRCP in 1 year to ensure stability. 4. Lesion of the posteromedial right lobe of the liver, hepatic segment VII, with a somewhat reticular, progressive peripheral contrast enhancement pattern, measuring 3.9 x 3.1 cm. This is most consistent with an atypical, although benign hemangioma. Attention on follow-up with above recommended  examination in 1 year. Electronically Signed   By: Jearld Lesch M.D.   On: 05/29/2023 14:00    US Abdomen Limited RUQ (LIVER/GB)  Result Date: 05/28/2023 EXAM: ULTRASOUND ABDOMEN LIMITED RIGHT UPPER QUADRANT COMPARISON:  None Available. FINDINGS: Gallbladder: No wall thickening or pericholecystic fluid. Stones are noted within the gallbladder, which measure up to 1.3 cm. No sonographic Murphy sign noted by sonographer. Common bile duct: Diameter: 6 mm, at the upper limit of normal, although the biliary tree is quite difficult to visualize. No intrahepatic biliary ductal dilatation is seen. Liver: Evaluation is limited by limited acoustic windows. Within this limitation, no focal lesion identified. Within normal limits in parenchymal echogenicity. Portal vein is patent on color Doppler imaging with normal direction of blood flow towards the liver. Other: None. IMPRESSION: 1. Cholelithiasis without evidence of acute cholecystitis. 2. Common bile duct measures at the upper limit of normal, although the biliary tree is quite difficult to visualize. No intrahepatic biliary ductal dilatation is seen. 3. Limited evaluation of the liver due to limited acoustic windows. Within this limitation, no focal lesion is seen. Electronically Signed   By: Wiliam Ke M.D.   On: 05/28/2023 01:54   CT Angio Chest/Abd/Pel for Dissection W and/or Wo Contrast  Result Date: 05/28/2023 CLINICAL DATA:  Abdominal pain.  History of aortic aneurysm. EXAM: CT ANGIOGRAPHY CHEST, ABDOMEN AND PELVIS TECHNIQUE: Non-contrast CT of the chest was initially obtained. Multidetector CT imaging through the chest, abdomen and pelvis was performed using the standard protocol during bolus administration of intravenous contrast. Multiplanar reconstructed images and MIPs were obtained and reviewed to evaluate the vascular anatomy. RADIATION DOSE REDUCTION: This exam was performed according to the departmental dose-optimization program which includes automated exposure control, adjustment of the mA and/or kV according to patient size and/or use  of iterative reconstruction technique. CONTRAST:  75mL OMNIPAQUE IOHEXOL 350 MG/ML SOLN COMPARISON:  CTA chest 07/26/2020; aortic ultrasound 08/25/2019 FINDINGS: CTA CHEST FINDINGS Cardiovascular: Normal heart size. No pericardial effusion. Aortic atherosclerotic calcification without dissection the ascending aorta is mildly dilated measuring 40 mm in diameter. Mediastinum/Nodes: Trachea and esophagus are unremarkable. No thoracic adenopathy. Lungs/Pleura: Scattered areas of scarring and bronchiolectasis, favored postinfectious given findings on CT 07/26/2020. No focal consolidation, pleural effusion, or pneumothorax. Musculoskeletal: No chest wall abnormality. No acute or significant osseous findings. Review of the MIP images confirms the above findings. CTA ABDOMEN AND PELVIS FINDINGS VASCULAR Aorta: Calcified atherosclerotic plaque without aneurysm or dissection. Celiac: Patent without aneurysm or dissection. SMA: Patent without aneurysm or dissection. Renals: Mild narrowing at the origins of the renal arteries bilaterally secondary to calcified plaque. No aneurysm or dissection. IMA: Patent. Inflow: Scattered calcified atherosclerotic plaque without significant narrowing. No aneurysm or dissection. Veins: No obvious venous abnormality within the limitations of this arterial phase study. Review of the MIP images confirms the above findings. NON-VASCULAR Hepatobiliary: Hepatic cysts. Cholelithiasis. No evidence of cholecystitis. No biliary dilation. Pancreas: Ill-defined low-density lesion in the uncinate process of the pancreas (6/202) measuring 16 x 11 mm. Additional low-density lesions in the tail of the pancreas measuring 18 mm (6/181) and 14 mm (6/188). No evidence of acute pancreatitis. No pancreatic ductal dilation. Spleen: Unremarkable. Adrenals/Urinary Tract: Stable adrenal glands compared with 07/26/2020. Low-attenuation lesions in the kidneys are statistically likely to represent cysts. No follow-up is  required. Four calculi are present within the bladder with the largest measuring 12 mm and located near the right UVJ. No hydronephrosis to suggest obstruction. Mild diffuse bladder wall thickening. Stomach/Bowel:  Normal caliber large and small bowel. Postoperative change about the sigmoid colon. No bowel wall thickening. Normal appendix. Stomach is within normal limits. Lymphatic: No lymphadenopathy. Reproductive: Enlarged prostate. Other: No free intraperitoneal fluid or air. Musculoskeletal: No acute fracture. Review of the MIP images confirms the above findings. IMPRESSION: 1. No acute aortic syndrome. 2. Mildly dilated ascending aorta measuring 40 mm in diameter. Recommend annual imaging followup by CTA or MRA. This recommendation follows 2010 ACCF/AHA/AATS/ACR/ASA/SCA/SCAI/SIR/STS/SVM Guidelines for the Diagnosis and Management of Patients with Thoracic Aortic Disease. Circulation. 2010; 121: A213-Y865. Aortic aneurysm NOS (ICD10-I71.9) 3. Multiple low-density lesions in the pancreas measuring up to 18 mm. Recommend further evaluation with nonemergent MRI with and without contrast. 4. Four calculi are present within the bladder with the largest measuring 12 mm and located near the right UVJ. No hydronephrosis to suggest obstruction. Aortic Atherosclerosis (ICD10-I70.0). Electronically Signed   By: Minerva Fester M.D.   On: 05/28/2023 00:26     Assessment and Plan:   Atrial fib. Duration unclear but apparently noted during ERCP today. Last documented Ecg was 2021. His rate is controlled (has not received metoprolol since admission). He is completely asymptomatic. [place] Vasc score of 4. Prior Echo in November showed normal LV function with marked LAE. The fact that he has severe LAE it may difficult to restore NSR. At this point there is no reason that Afib should delay his cholecystectomy. Post op would recommend stopping ASA and starting on anticoagulation with Eliquis once safe to anticoagulate from a  surgery standpoint. Would update Echo but this does not need to delay surgery. Later as outpatient could determine if we need to attempt to restore NSR. CAD. Extensive coronary calcification noted on CT. No anginal symptoms. No history of MI or stent. Again given lack of symptoms may proceed with surgery without further ischemic work up. Focus for now on risk factor modification. HTN. All BP meds held on admission. Would continue to hold ACEi and aldactone given CKD. Will resume amlodipine since hypertensive. Hold beta blocker for now but will observe HR on telemetry AAA [facility].  HLD excellent control on atorvastatin at home. Would continue. LFT elevation is due to gallstones.    Risk Assessment/Risk Scores:          CHA2DS2-VASc Score = 4   This indicates a 4.8% annual risk of stroke. The patient's score is based upon: CHF History: 0 HTN History: 1 Diabetes History: 0 Stroke History: 0 Vascular Disease History: 1 Age Score: 2 Gender Score: 0         For questions or updates, please contact [facility] Please consult [email or web address] for contact info under    Signed, Jiovanni Heeter [place], MD  05/30/2023 6:40 PM

## 2023-05-30 NOTE — Progress Notes (Signed)
Patient to endo at this time.

## 2023-05-30 NOTE — Anesthesia Postprocedure Evaluation (Signed)
Anesthesia Post Note  Patient: Alexander Howe  Procedure(s) Performed: ENDOSCOPIC RETROGRADE CHOLANGIOPANCREATOGRAPHY (ERCP) WITH PROPOFOL PANCREATIC STENT PLACEMENT BIOPSY     Patient location during evaluation: PACU Anesthesia Type: General Level of consciousness: awake and alert and oriented Pain management: pain level controlled Vital Signs Assessment: post-procedure vital signs reviewed and stable Respiratory status: spontaneous breathing, nonlabored ventilation and respiratory function stable Cardiovascular status: blood pressure returned to baseline and stable Postop Assessment: no apparent nausea or vomiting Anesthetic complications: no Comments: Patient in Atrial Fibrillation. Hospitalist notified.   No notable events documented.  Last Vitals:  Vitals:   05/30/23 1629 05/30/23 1640  BP: (!) 157/96 (!) 158/90  Pulse: (!) 55 62  Resp: (!) 0 14  Temp: 36.4 C   SpO2: 100% 97%    Last Pain:  Vitals:   05/30/23 1650  TempSrc:   PainSc: 0-No pain                 Caroline Matters A.

## 2023-05-30 NOTE — Progress Notes (Signed)
Patient returns from endo at this time 

## 2023-05-30 NOTE — Anesthesia Procedure Notes (Signed)
Procedure Name: Intubation Date/Time: 05/30/2023 2:42 PM  Performed by: Waynard Edwards, CRNAPre-anesthesia Checklist: Patient identified, Emergency Drugs available, Suction available and Patient being monitored Patient Re-evaluated:Patient Re-evaluated prior to induction Oxygen Delivery Method: Circle system utilized Preoxygenation: Pre-oxygenation with 100% oxygen Induction Type: IV induction Ventilation: Mask ventilation without difficulty and Oral airway inserted - appropriate to patient size Laryngoscope Size: Mac and 3 Grade View: Grade II Tube type: Oral Tube size: 7.5 mm Number of attempts: 1 Airway Equipment and Method: Stylet and Oral airway Placement Confirmation: ETT inserted through vocal cords under direct vision, positive ETCO2 and breath sounds checked- equal and bilateral Secured at: 24 cm Tube secured with: Tape Dental Injury: Teeth and Oropharynx as per pre-operative assessment

## 2023-05-30 NOTE — Anesthesia Preprocedure Evaluation (Addendum)
Anesthesia Evaluation  Patient identified by MRN, date of birth, ID band Patient awake    Reviewed: Allergy & Precautions, NPO status , Patient's Chart, lab work & pertinent test results, reviewed documented beta blocker date and time   Airway Mallampati: I  TM Distance: >3 FB Neck ROM: Limited    Dental  (+) Implants, Dental Advisory Given, Caps   Pulmonary sleep apnea and Continuous Positive Airway Pressure Ventilation , former smoker   Pulmonary exam normal breath sounds clear to auscultation       Cardiovascular hypertension, Pt. on medications and Pt. on home beta blockers + Peripheral Vascular Disease  Normal cardiovascular exam Rhythm:Regular Rate:Normal  Hx/o AAA  Hx/o rheumatic fever   Neuro/Psych  PSYCHIATRIC DISORDERS Anxiety Depression    negative neurological ROS     GI/Hepatic Elevated LFT's Choledocholithiasis Hx/o diverticulitis S/P colostomy and then reversal   Endo/Other  Hyperlipidemia Obesity  Renal/GU Renal InsufficiencyRenal disease  negative genitourinary   Musculoskeletal  (+) Arthritis , Osteoarthritis,    Abdominal  (+) + obese  Peds  Hematology  (+) Blood dyscrasia , REFUSES BLOOD PRODUCTSThrombocytopenia   Anesthesia Other Findings   Reproductive/Obstetrics                             Anesthesia Physical Anesthesia Plan  ASA: 3  Anesthesia Plan: General   Post-op Pain Management: Minimal or no pain anticipated   Induction: Intravenous, Rapid sequence and Cricoid pressure planned  PONV Risk Score and Plan: 4 or greater and Treatment may vary due to age or medical condition and Ondansetron  Airway Management Planned: Oral ETT  Additional Equipment: None  Intra-op Plan:   Post-operative Plan: Extubation in OR  Informed Consent: I have reviewed the patients History and Physical, chart, labs and discussed the procedure including the risks, benefits  and alternatives for the proposed anesthesia with the patient or authorized representative who has indicated his/her understanding and acceptance.     Dental advisory given  Plan Discussed with: CRNA and Anesthesiologist  Anesthesia Plan Comments:         Anesthesia Quick Evaluation

## 2023-05-31 ENCOUNTER — Inpatient Hospital Stay (HOSPITAL_COMMUNITY): Payer: Medicare HMO

## 2023-05-31 ENCOUNTER — Encounter (HOSPITAL_COMMUNITY): Payer: Self-pay | Admitting: Internal Medicine

## 2023-05-31 DIAGNOSIS — K8050 Calculus of bile duct without cholangitis or cholecystitis without obstruction: Secondary | ICD-10-CM

## 2023-05-31 DIAGNOSIS — R7989 Other specified abnormal findings of blood chemistry: Secondary | ICD-10-CM | POA: Diagnosis not present

## 2023-05-31 DIAGNOSIS — K8070 Calculus of gallbladder and bile duct without cholecystitis without obstruction: Secondary | ICD-10-CM | POA: Diagnosis not present

## 2023-05-31 DIAGNOSIS — I2584 Coronary atherosclerosis due to calcified coronary lesion: Secondary | ICD-10-CM | POA: Diagnosis not present

## 2023-05-31 DIAGNOSIS — K859 Acute pancreatitis without necrosis or infection, unspecified: Secondary | ICD-10-CM

## 2023-05-31 DIAGNOSIS — I4819 Other persistent atrial fibrillation: Secondary | ICD-10-CM | POA: Diagnosis not present

## 2023-05-31 DIAGNOSIS — I4891 Unspecified atrial fibrillation: Secondary | ICD-10-CM

## 2023-05-31 DIAGNOSIS — I2510 Atherosclerotic heart disease of native coronary artery without angina pectoris: Secondary | ICD-10-CM | POA: Diagnosis not present

## 2023-05-31 LAB — COMPREHENSIVE METABOLIC PANEL
ALT: 153 U/L — ABNORMAL HIGH (ref 0–44)
ALT: 139 U/L — ABNORMAL HIGH (ref 0–44)
AST: 86 U/L — ABNORMAL HIGH (ref 15–41)
AST: 73 U/L — ABNORMAL HIGH (ref 15–41)
Albumin: 3.6 g/dL (ref 3.5–5.0)
Albumin: 3.6 g/dL (ref 3.5–5.0)
Alkaline Phosphatase: 53 U/L (ref 38–126)
Alkaline Phosphatase: 51 U/L (ref 38–126)
Anion gap: 9 (ref 5–15)
Anion gap: 8 (ref 5–15)
BUN: 25 mg/dL — ABNORMAL HIGH (ref 8–23)
BUN: 24 mg/dL — ABNORMAL HIGH (ref 8–23)
CO2: 27 mmol/L (ref 22–32)
CO2: 26 mmol/L (ref 22–32)
Calcium: 9.8 mg/dL (ref 8.9–10.3)
Calcium: 9.7 mg/dL (ref 8.9–10.3)
Chloride: 102 mmol/L (ref 98–111)
Chloride: 102 mmol/L (ref 98–111)
Creatinine, Ser: 1.59 mg/dL — ABNORMAL HIGH (ref 0.61–1.24)
Creatinine, Ser: 1.49 mg/dL — ABNORMAL HIGH (ref 0.61–1.24)
GFR, Estimated: 45 mL/min — ABNORMAL LOW (ref >60)
GFR, Estimated: 48 mL/min — ABNORMAL LOW (ref >60)
Glucose, Bld: 115 mg/dL — ABNORMAL HIGH (ref 70–99)
Glucose, Bld: 112 mg/dL — ABNORMAL HIGH (ref 70–99)
Potassium: 3.4 mmol/L — ABNORMAL LOW (ref 3.5–5.1)
Potassium: 4.3 mmol/L (ref 3.5–5.1)
Sodium: 138 mmol/L (ref 135–145)
Sodium: 136 mmol/L (ref 135–145)
Total Bilirubin: 4.1 mg/dL — ABNORMAL HIGH (ref 0.3–1.2)
Total Bilirubin: 4.1 mg/dL — ABNORMAL HIGH (ref 0.3–1.2)
Total Protein: 6.7 g/dL (ref 6.5–8.1)
Total Protein: 6.5 g/dL (ref 6.5–8.1)

## 2023-05-31 LAB — ECHOCARDIOGRAM COMPLETE
AR max vel: 2.47 cm2
AV Area VTI: 2.88 cm2
AV Area mean vel: 2.74 cm2
AV Mean grad: 3.7 mmHg
AV Peak grad: 7 mmHg
Ao pk vel: 1.32 m/s
Area-P 1/2: 6.09 cm2
Calc EF: 61.3 %
Height: 73 in
MV VTI: 3.34 cm2
S' Lateral: 3.9 cm
Single Plane A2C EF: 59.8 %
Single Plane A4C EF: 61.4 %
Weight: 4102.32 oz

## 2023-05-31 LAB — CBC WITH DIFFERENTIAL/PLATELET
Abs Immature Granulocytes: 0.07 10*3/uL (ref 0.00–0.07)
Basophils Absolute: 0 10*3/uL (ref 0.0–0.1)
Basophils Relative: 0 %
Eosinophils Absolute: 0 10*3/uL (ref 0.0–0.5)
Eosinophils Relative: 0 %
HCT: 46.2 % (ref 39.0–52.0)
Hemoglobin: 15.5 g/dL (ref 13.0–17.0)
Immature Granulocytes: 1 %
Lymphocytes Relative: 6 %
Lymphs Abs: 0.8 10*3/uL (ref 0.7–4.0)
MCH: 31.6 pg (ref 26.0–34.0)
MCHC: 33.5 g/dL (ref 30.0–36.0)
MCV: 94.3 fL (ref 80.0–100.0)
Monocytes Absolute: 1.6 10*3/uL — ABNORMAL HIGH (ref 0.1–1.0)
Monocytes Relative: 11 %
Neutro Abs: 12.3 10*3/uL — ABNORMAL HIGH (ref 1.7–7.7)
Neutrophils Relative %: 82 %
Platelets: 147 10*3/uL — ABNORMAL LOW (ref 150–400)
RBC: 4.9 MIL/uL (ref 4.22–5.81)
RDW: 13.7 % (ref 11.5–15.5)
WBC: 14.8 10*3/uL — ABNORMAL HIGH (ref 4.0–10.5)
nRBC: 0 % (ref 0.0–0.2)

## 2023-05-31 LAB — CBC
HCT: 46.2 % (ref 39.0–52.0)
Hemoglobin: 15 g/dL (ref 13.0–17.0)
MCH: 31.6 pg (ref 26.0–34.0)
MCHC: 32.5 g/dL (ref 30.0–36.0)
MCV: 97.5 fL (ref 80.0–100.0)
Platelets: 142 10*3/uL — ABNORMAL LOW (ref 150–400)
RBC: 4.74 MIL/uL (ref 4.22–5.81)
RDW: 13.8 % (ref 11.5–15.5)
WBC: 9.1 10*3/uL (ref 4.0–10.5)
nRBC: 0 % (ref 0.0–0.2)

## 2023-05-31 LAB — LIPASE, BLOOD
Lipase: 5934 U/L — ABNORMAL HIGH (ref 11–51)
Lipase: 2720 U/L — ABNORMAL HIGH (ref 11–51)

## 2023-05-31 LAB — MAGNESIUM
Magnesium: 2 mg/dL (ref 1.7–2.4)
Magnesium: 2 mg/dL (ref 1.7–2.4)

## 2023-05-31 LAB — PHOSPHORUS: Phosphorus: 2.9 mg/dL (ref 2.5–4.6)

## 2023-05-31 MED ORDER — LORAZEPAM 2 MG/ML IJ SOLN
0.5000 mg | INTRAMUSCULAR | Status: AC | PRN
  Administered 2023-05-31: 0.5 mg via INTRAVENOUS
  Filled 2023-05-31: qty 1

## 2023-05-31 MED ORDER — HYDRALAZINE HCL 20 MG/ML IJ SOLN: 2.0000 mg | Freq: Four times a day (QID) | INTRAMUSCULAR | Status: DC | PRN

## 2023-05-31 MED ORDER — AMLODIPINE BESYLATE 5 MG PO TABS
5.0000 mg | ORAL_TABLET | Freq: Every day | ORAL | Status: DC
  Administered 2023-05-31 – 2023-06-02 (×3): 5 mg via ORAL
  Filled 2023-05-31 (×3): qty 1

## 2023-05-31 MED ORDER — PIPERACILLIN-TAZOBACTAM 3.375 G IVPB 30 MIN
3.3750 g | Freq: Once | INTRAVENOUS | Status: AC
  Administered 2023-05-31: 3.375 g via INTRAVENOUS
  Filled 2023-05-31 (×2): qty 50

## 2023-05-31 MED ORDER — PROMETHAZINE (PHENERGAN) 6.25MG IN NS 50ML IVPB
6.2500 mg | INTRAVENOUS | Status: AC
  Administered 2023-05-31: 6.25 mg via INTRAVENOUS
  Filled 2023-05-31: qty 6.25

## 2023-05-31 MED ORDER — HYDRALAZINE HCL 20 MG/ML IJ SOLN
2.0000 mg | Freq: Four times a day (QID) | INTRAMUSCULAR | Status: DC | PRN
  Administered 2023-05-31 – 2023-06-02 (×4): 2 mg via INTRAVENOUS
  Filled 2023-05-31 (×5): qty 1

## 2023-05-31 MED ORDER — PIPERACILLIN-TAZOBACTAM 3.375 G IVPB
3.3750 g | Freq: Three times a day (TID) | INTRAVENOUS | Status: DC
  Administered 2023-06-01 – 2023-06-03 (×8): 3.375 g via INTRAVENOUS
  Filled 2023-05-31 (×9): qty 50

## 2023-05-31 MED ORDER — PROMETHAZINE (PHENERGAN) 6.25MG IN NS 50ML IVPB
6.2500 mg | INTRAVENOUS | Status: AC
  Administered 2023-05-31: 6.25 mg via INTRAVENOUS
  Filled 2023-05-31: qty 6.25
  Filled 2023-05-31: qty 50

## 2023-05-31 MED ORDER — HYDRALAZINE HCL 10 MG PO TABS: 10.0000 mg | ORAL_TABLET | Freq: Three times a day (TID) | ORAL | Status: DC

## 2023-05-31 MED ORDER — LACTATED RINGERS IV SOLN: INTRAVENOUS | Status: DC

## 2023-05-31 MED ORDER — PROCHLORPERAZINE EDISYLATE 10 MG/2ML IJ SOLN
10.0000 mg | Freq: Four times a day (QID) | INTRAMUSCULAR | Status: DC
  Administered 2023-06-01 (×2): 10 mg via INTRAVENOUS
  Filled 2023-05-31 (×3): qty 2

## 2023-05-31 MED ORDER — HYDRALAZINE HCL 10 MG PO TABS: 10.0000 mg | ORAL_TABLET | Freq: Three times a day (TID) | ORAL | Status: DC | PRN

## 2023-05-31 MED ORDER — POTASSIUM CHLORIDE 2 MEQ/ML IV SOLN
INTRAVENOUS | Status: DC
  Filled 2023-05-31: qty 1000

## 2023-05-31 MED ORDER — METOPROLOL SUCCINATE ER 25 MG PO TB24: 12.5000 mg | ORAL_TABLET | Freq: Every day | ORAL | Status: DC

## 2023-05-31 MED ORDER — MORPHINE SULFATE (PF) 2 MG/ML IV SOLN
2.0000 mg | INTRAVENOUS | Status: AC | PRN
  Administered 2023-05-31 – 2023-06-02 (×4): 2 mg via INTRAVENOUS
  Filled 2023-05-31 (×4): qty 1

## 2023-05-31 NOTE — Care Management Important Message (Signed)
Important Message  Patient Details  Name: Alexander Howe MRN: [id number] Date of Birth: 1946-03-12   Medicare Important Message Given:  Yes     Dorena Bodo 05/31/2023, 2:31 PM

## 2023-05-31 NOTE — Progress Notes (Addendum)
Received a call from bedside RN regarding the patient becoming transiently severely bradycardic with heart rate of 26, and recovering into the 50s.  Repeated twelve-lead EKG and lab work.  Lab studies notable for leukocytosis with WBC 14K.  Presented at bedside, the patient reports he has been having chills.  Peripheral blood cultures x 2 ordered.  Due to concern for acute cholangitis started Zosyn.  Will transfer to progressive care unit for close monitoring.  Time: 15 minutes.

## 2023-05-31 NOTE — Progress Notes (Signed)
Attempted echo @ 9:03am -- pt was up in bed actively vomiting and in pain. RN working to give meds. Will try again later today.

## 2023-05-31 NOTE — Progress Notes (Signed)
1 Day Post-Op   Subjective/Chief Complaint: Pt has had upper abd pain and vomiting all night   Objective: Vital signs in last 24 hours: Temp:  [97.4 F (36.3 C)-97.8 F (36.6 C)] 97.8 F (36.6 C) (07/18 0735) Pulse Rate:  [54-68] 63 (07/18 1147) Resp:  [0-17] 16 (07/18 1147) BP: (157-182)/(82-103) 182/82 (07/18 1147) SpO2:  [95 %-100 %] 98 % (07/18 1147) Weight:  [113.4 kg-116.3 kg] 116.3 kg (07/18 0505) Last BM Date : 05/27/23  Intake/Output from previous day: 07/17 0701 - 07/18 0700 In: 850 [I.V.:750; IV Piggyback:100] Out: -  Intake/Output this shift: Total I/O In: -  Out: 200 [Urine:200]  General appearance: alert and cooperative Resp: clear to auscultation bilaterally Cardio: irregularly irregular rhythm GI: tender in upper abd  Lab Results:  Recent Labs    05/30/23 0016 05/30/23 2335  WBC 8.5 9.1  HGB 13.7 15.0  HCT 43.3 46.2  PLT 129* 142*   BMET Recent Labs    05/30/23 0016 05/31/23 0806  NA 141 138  K 4.3 3.4*  CL 104 102  CO2 29 27  GLUCOSE 93 115*  BUN 28* 25*  CREATININE 1.55* 1.59*  CALCIUM 9.6 9.8   PT/INR No results for input(s): "LABPROT", "INR" in the last 72 hours. ABG No results for input(s): "PHART", "HCO3" in the last 72 hours.  Invalid input(s): "PCO2", "PO2"  Studies/Results: ECHOCARDIOGRAM COMPLETE  Result Date: 05/31/2023    ECHOCARDIOGRAM REPORT   Patient Name:   Alexander Howe Date of Exam: 05/31/2023 Medical Rec #:  [id number]      Height:       73.0 in Accession #:    [id number]     Weight:       256.4 lb Date of Birth:  May 06, 1946     BSA:          2.391 m Patient Age:    77 years       BP:           162/95 mmHg Patient Gender: M              HR:           60 bpm. Exam Location:  Inpatient Procedure: 2D Echo, Cardiac Doppler and Color Doppler Indications:    Atrial Fibrillation  History:        Patient has prior history of Echocardiogram examinations, most                 recent 09/25/2022. CKD, AAA, hx cancer,  Arrythmias:Atrial                 Fibrillation; Risk Factors:Hypertension, Sleep Apnea, Former                 Smoker, hx rheumatic fever and Dyslipidemia.  Sonographer:    Wallie Char Referring Phys: [id number] PRATIK D [facility]  Sonographer Comments: Image acquisition challenging due to patient body habitus. IMPRESSIONS  1. Left ventricular ejection fraction, by estimation, is 60 to 65%. The left ventricle has normal function. The left ventricle has no regional wall motion abnormalities. There is mild asymmetric left ventricular hypertrophy of the basal-septal segment. Left ventricular diastolic function could not be evaluated.  2. Right ventricular systolic function is normal. The right ventricular size is normal.  3. Left atrial size was severely dilated.  4. Right atrial size was mild to moderately dilated.  5. The mitral valve is normal in structure. No evidence of mitral valve regurgitation. No evidence of mitral  stenosis.  6. The aortic valve is tricuspid. There is mild calcification of the aortic valve. Aortic valve regurgitation is trivial. Aortic valve sclerosis/calcification is present, without any evidence of aortic stenosis.  7. Aortic dilatation noted. There is borderline dilatation of the aortic root, measuring 39 mm. 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  8. The inferior vena cava is normal in size with greater than 50% respiratory variability, suggesting right atrial pressure of 3 mmHg. FINDINGS  Left Ventricle: Left ventricular ejection fraction, by estimation, is 60 to 65%. The left ventricle has normal function. The left ventricle has no regional wall motion abnormalities. The left ventricular internal cavity size was normal in size. There is  mild asymmetric left ventricular hypertrophy of the basal-septal segment. Left ventricular diastolic function could not be evaluated due to atrial fibrillation. Left ventricular diastolic function could not be evaluated. Right  Ventricle: The right ventricular size is normal. No increase in right ventricular wall thickness. Right ventricular systolic function is normal. Left Atrium: Left atrial size was severely dilated. Right Atrium: Right atrial size was mild to moderately dilated. Pericardium: There is no evidence of pericardial effusion. Mitral Valve: The mitral valve is normal in structure. Mild mitral annular calcification. No evidence of mitral valve regurgitation. No evidence of mitral valve stenosis. MV peak gradient, 3.9 mmHg. The mean mitral valve gradient is 1.0 mmHg. Tricuspid Valve: The tricuspid valve is normal in structure. Tricuspid valve regurgitation is trivial. No evidence of tricuspid stenosis. Aortic Valve: The aortic valve is tricuspid. There is mild calcification of the aortic valve. Aortic valve regurgitation is trivial. Aortic valve sclerosis/calcification is present, without any evidence of aortic stenosis. Aortic valve mean gradient measures 3.7 mmHg. Aortic valve peak gradient measures 7.0 mmHg. Aortic valve area, by VTI measures 2.88 cm. Pulmonic Valve: The pulmonic valve was normal in structure. Pulmonic valve regurgitation is trivial. No evidence of pulmonic stenosis. Aorta: Aortic dilatation noted. There is borderline dilatation of the aortic root, measuring 39 mm. 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 Venous: The inferior vena cava is normal in size with greater than 50% respiratory variability, suggesting right atrial pressure of 3 mmHg. IAS/Shunts: No atrial level shunt detected by color flow Doppler.  LEFT VENTRICLE PLAX 2D LVIDd:         5.80 cm      Diastology LVIDs:         3.90 cm      LV e' medial:    9.58 cm/s LV PW:         1.00 cm      LV E/e' medial:  10.5 LV IVS:        1.40 cm      LV e' lateral:   13.73 cm/s LVOT diam:     2.20 cm      LV E/e' lateral: 7.3 LV SV:         77 LV SV Index:   32 LVOT Area:     3.80 cm  LV Volumes (MOD) LV vol d, MOD A2C: 95.8 ml  LV vol d, MOD A4C: 147.0 ml LV vol s, MOD A2C: 38.5 ml LV vol s, MOD A4C: 56.7 ml LV SV MOD A2C:     57.3 ml LV SV MOD A4C:     147.0 ml LV SV MOD BP:      79.4 ml RIGHT VENTRICLE             IVC  RV Basal diam:  4.10 cm     IVC diam: 1.40 cm RV S prime:     13.53 cm/s TAPSE (M-mode): 2.5 cm LEFT ATRIUM              Index        RIGHT ATRIUM           Index LA diam:        6.40 cm  2.68 cm/m   RA Area:     25.00 cm LA Vol (A2C):   155.0 ml 64.82 ml/m  RA Volume:   68.50 ml  28.65 ml/m LA Vol (A4C):   137.0 ml 57.29 ml/m LA Biplane Vol: 152.0 ml 63.57 ml/m  AORTIC VALVE AV Area (Vmax):    2.47 cm AV Area (Vmean):   2.74 cm AV Area (VTI):     2.88 cm AV Vmax:           132.00 cm/s AV Vmean:          83.567 cm/s AV VTI:            0.269 m AV Peak Grad:      7.0 mmHg AV Mean Grad:      3.7 mmHg LVOT Vmax:         85.77 cm/s LVOT Vmean:        60.167 cm/s LVOT VTI:          0.204 m LVOT/AV VTI ratio: 0.76  AORTA Ao Root diam: 3.90 cm Ao Asc diam:  4.60 cm MITRAL VALVE MV Area (PHT): 6.09 cm     SHUNTS MV Area VTI:   3.34 cm     Systemic VTI:  0.20 m MV Peak grad:  3.9 mmHg     Systemic Diam: 2.20 cm MV Mean grad:  1.0 mmHg MV Vmax:       0.98 m/s MV Vmean:      47.7 cm/s MV Decel Time: 125 msec MV E velocity: 100.40 cm/s Arvilla Meres MD Electronically signed by Arvilla Meres MD Signature Date/Time: 05/31/2023/10:10:24 AM    Final    DG ERCP  Result Date: 05/31/2023 CLINICAL DATA:  Cholelithiasis, choledocholithiasis EXAM: ERCP TECHNIQUE: Multiple spot images obtained with the fluoroscopic device and submitted for interpretation post-procedure. FLUOROSCOPY: Radiation Exposure Index (as provided by the fluoroscopic device): 57.1 mGy Kerma COMPARISON:  MRCP 05/29/2023 FINDINGS: A total of 4 intraoperative saved images are submitted for review. The images demonstrate a flexible duodenal scope in the descending duodenum. The images document placement of a plastic stent in the pancreatic duct. IMPRESSION:  ERCP with plastic pancreatic duct stent placement. These images were submitted for radiologic interpretation only. Please see the procedural report for the amount of contrast and the fluoroscopy time utilized. Electronically Signed   By: Malachy Moan M.D.   On: 05/31/2023 08:16   DG C-Arm 1-60 Min-No Report  Result Date: 05/30/2023 Fluoroscopy was utilized by the requesting physician.  No radiographic interpretation.   MR ABDOMEN MRCP W WO CONTAST  Result Date: 05/29/2023 CLINICAL DATA:  Biliary obstruction suspected, pancreas mass, CBD obstruction EXAM: MRI ABDOMEN WITHOUT AND WITH CONTRAST (INCLUDING MRCP) TECHNIQUE: Multiplanar multisequence MR imaging of the abdomen was performed both before and after the administration of intravenous contrast. Heavily T2-weighted images of the biliary and pancreatic ducts were obtained, and three-dimensional MRCP images were rendered by post processing. CONTRAST:  10mL GADAVIST GADOBUTROL 1 MMOL/ML IV SOLN COMPARISON:  Right upper quadrant ultrasound, 05/28/2023, CT chest abdomen pelvis angiogram, 05/28/2023  FINDINGS: Lower chest: No acute abnormality. Hepatobiliary: Multiple benign liver cysts. Intrinsically T2 hyperintense lesion of the posteromedial right lobe of the liver, hepatic segment VII, with a somewhat reticular, progressive peripheral contrast enhancement pattern, measuring 3.9 x 3.1 cm (series 902, image 67). Small gallstones in the gallbladder. No gallbladder wall thickening. Mild inflammatory fat stranding and fluid in the gallbladder fossa (series 4, image 16). Mild intra and extrahepatic biliary ductal dilatation, common bile duct measuring up to 0.8 cm in caliber. Sludge and tiny gallstones in the central common bile duct near the ampulla, measuring no greater than 0.3 cm (series 3, image 31, series 2, image 21). Pancreas: Multiple simple and thinly septated fluid signal cystic lesions throughout the pancreas, largest in the dorsal pancreatic  tail, communicating with the main pancreatic duct, measuring 2.3 x 2.1 cm (series 3, image 27). No solid component or suspicious contrast enhancement. No pancreatic ductal dilatation or surrounding inflammatory changes. Spleen: Normal in size without significant abnormality. Adrenals/Urinary Tract: Macroscopic fat containing, benign bilateral adrenal adenomata, requiring no specific further follow-up or characterization. Multiple simple and thinly septated benign bilateral renal cortical cysts, for which no specific further follow-up or characterization is required. Kidneys are otherwise normal, without obvious renal calculi, solid lesion, or hydronephrosis. Stomach/Bowel: Stomach is within normal limits. No evidence of bowel wall thickening, distention, or inflammatory changes. Vascular/Lymphatic: No significant vascular findings are present. No enlarged abdominal lymph nodes. Other: No abdominal wall hernia or abnormality. No ascites. Musculoskeletal: No acute or significant osseous findings. IMPRESSION: 1. Mild intra and extrahepatic biliary ductal dilatation, common bile duct measuring up to 0.8 cm in caliber. Sludge and tiny gallstones in the central common bile duct near the ampulla, measuring no greater than 0.3 cm. 2. Cholelithiasis with mild inflammatory fat stranding and fluid in the gallbladder fossa, consistent with acute cholecystitis. 3. Multiple simple and thinly septated fluid signal cystic lesions throughout the pancreas, largest in the dorsal pancreatic tail, communicating with the main pancreatic duct, measuring 2.3 x 2.1 cm. No solid component or suspicious contrast enhancement. No pancreatic ductal dilatation or surrounding inflammatory changes. These are most consistent with side branch IPMNs. Recommend follow-up MRI/MRCP in 1 year to ensure stability. 4. Lesion of the posteromedial right lobe of the liver, hepatic segment VII, with a somewhat reticular, progressive peripheral contrast  enhancement pattern, measuring 3.9 x 3.1 cm. This is most consistent with an atypical, although benign hemangioma. Attention on follow-up with above recommended examination in 1 year. Electronically Signed   By: Jearld Lesch M.D.   On: 05/29/2023 14:00   MR 3D Recon At Scanner  Result Date: 05/29/2023 CLINICAL DATA:  Biliary obstruction suspected, pancreas mass, CBD obstruction EXAM: MRI ABDOMEN WITHOUT AND WITH CONTRAST (INCLUDING MRCP) TECHNIQUE: Multiplanar multisequence MR imaging of the abdomen was performed both before and after the administration of intravenous contrast. Heavily T2-weighted images of the biliary and pancreatic ducts were obtained, and three-dimensional MRCP images were rendered by post processing. CONTRAST:  10mL GADAVIST GADOBUTROL 1 MMOL/ML IV SOLN COMPARISON:  Right upper quadrant ultrasound, 05/28/2023, CT chest abdomen pelvis angiogram, 05/28/2023 FINDINGS: Lower chest: No acute abnormality. Hepatobiliary: Multiple benign liver cysts. Intrinsically T2 hyperintense lesion of the posteromedial right lobe of the liver, hepatic segment VII, with a somewhat reticular, progressive peripheral contrast enhancement pattern, measuring 3.9 x 3.1 cm (series 902, image 67). Small gallstones in the gallbladder. No gallbladder wall thickening. Mild inflammatory fat stranding and fluid in the gallbladder fossa (series 4, image 16). Mild intra and  extrahepatic biliary ductal dilatation, common bile duct measuring up to 0.8 cm in caliber. Sludge and tiny gallstones in the central common bile duct near the ampulla, measuring no greater than 0.3 cm (series 3, image 31, series 2, image 21). Pancreas: Multiple simple and thinly septated fluid signal cystic lesions throughout the pancreas, largest in the dorsal pancreatic tail, communicating with the main pancreatic duct, measuring 2.3 x 2.1 cm (series 3, image 27). No solid component or suspicious contrast enhancement. No pancreatic ductal dilatation or  surrounding inflammatory changes. Spleen: Normal in size without significant abnormality. Adrenals/Urinary Tract: Macroscopic fat containing, benign bilateral adrenal adenomata, requiring no specific further follow-up or characterization. Multiple simple and thinly septated benign bilateral renal cortical cysts, for which no specific further follow-up or characterization is required. Kidneys are otherwise normal, without obvious renal calculi, solid lesion, or hydronephrosis. Stomach/Bowel: Stomach is within normal limits. No evidence of bowel wall thickening, distention, or inflammatory changes. Vascular/Lymphatic: No significant vascular findings are present. No enlarged abdominal lymph nodes. Other: No abdominal wall hernia or abnormality. No ascites. Musculoskeletal: No acute or significant osseous findings. IMPRESSION: 1. Mild intra and extrahepatic biliary ductal dilatation, common bile duct measuring up to 0.8 cm in caliber. Sludge and tiny gallstones in the central common bile duct near the ampulla, measuring no greater than 0.3 cm. 2. Cholelithiasis with mild inflammatory fat stranding and fluid in the gallbladder fossa, consistent with acute cholecystitis. 3. Multiple simple and thinly septated fluid signal cystic lesions throughout the pancreas, largest in the dorsal pancreatic tail, communicating with the main pancreatic duct, measuring 2.3 x 2.1 cm. No solid component or suspicious contrast enhancement. No pancreatic ductal dilatation or surrounding inflammatory changes. These are most consistent with side branch IPMNs. Recommend follow-up MRI/MRCP in 1 year to ensure stability. 4. Lesion of the posteromedial right lobe of the liver, hepatic segment VII, with a somewhat reticular, progressive peripheral contrast enhancement pattern, measuring 3.9 x 3.1 cm. This is most consistent with an atypical, although benign hemangioma. Attention on follow-up with above recommended examination in 1 year.  Electronically Signed   By: Jearld Lesch M.D.   On: 05/29/2023 14:00    Anti-infectives: Anti-infectives (From admission, onward)    Start     Dose/Rate Route Frequency Ordered Stop   05/30/23 1345  ampicillin-sulbactam (UNASYN) 1.5 g in sodium chloride 0.9 % 100 mL IVPB       Note to Pharmacy: Please send to ENDO tube station 25   1.5 g 200 mL/hr over 30 Minutes Intravenous  Once 05/30/23 1252 05/30/23 1527   05/30/23 1245  cefTRIAXone (ROCEPHIN) 2 g in sodium chloride 0.9 % 100 mL IVPB        2 g 200 mL/hr over 30 Minutes Intravenous On call to O.R. 05/30/23 4098 05/31/23 0559       Assessment/Plan: s/p Procedure(s): ENDOSCOPIC RETROGRADE CHOLANGIOPANCREATOGRAPHY (ERCP) WITH PROPOFOL (N/A) PANCREATIC STENT PLACEMENT BIOPSY Continue bowel rest MRCP shows cbd stones and bili is elevated today. I think this is his main issue right now. We can remove gallbladder to prevent this from happening again but are not able to clear cbd very well. Will reach back out to [facility] for lap chole during this hospitalization Afib per cards. They have cleared him for surgery/procedures  LOS: 3 days    Chevis Pretty III 05/31/2023

## 2023-05-31 NOTE — Progress Notes (Signed)
Pt transferred to 6E Bed 5 for change in acuity.  Pt needed higher level of care per Margo Aye, MD.  During this shift pt was hypertensive, constant nauseated and bradycardic with HR dropping into the 30's per telemetry.  Per labs his WBC is elevated at 14.8 and lipase 2720 on 05/31/2023 at 1510.  Margo Aye, MD concerned for acute cholangitis and wanted pt to be transferred to Progressive Unit.  Report was called to [place], [place] on 6E.  Pt was transported via bed accompanied by 2 Care RN and his family.  He was transferred in stable condition.

## 2023-05-31 NOTE — Plan of Care (Signed)

## 2023-05-31 NOTE — Progress Notes (Signed)
Rounding Note    Patient Name: Alexander Howe Date of Encounter: 05/31/2023  [facility] Cardiologist: None new  Subjective   Complains of abdominal pain throughout the night. Did not sleep. No dyspnea or chest pain  Inpatient Medications    Scheduled Meds:  amLODipine  5 mg Oral Daily   diclofenac  100 mg Rectal Once   LORazepam  1 mg Oral Once   metoprolol succinate  100 mg Oral QHS   pantoprazole (PROTONIX) IV  40 mg Intravenous Q24H   PARoxetine  10 mg Oral Daily   sodium chloride flush  3 mL Intravenous Q12H   Continuous Infusions:  PRN Meds: acetaminophen, ALPRAZolam, fentaNYL (SUBLIMAZE) injection, morphine injection, ondansetron **OR** ondansetron (ZOFRAN) IV   Vital Signs    Vitals:   05/30/23 1944 05/30/23 2307 05/31/23 0505 05/31/23 0735  BP: (!) 162/95  (!) 165/83 (!) 162/95  Pulse: 68 68  (!) 59  Resp: 16 15 16    Temp: 97.6 F (36.4 C)  (!) 97.5 F (36.4 C) 97.8 F (36.6 C)  TempSrc: Oral  Oral Oral  SpO2: 98% 98% 99% 100%  Weight:   116.3 kg   Height:        Intake/Output Summary (Last 24 hours) at 05/31/2023 0838 Last data filed at 05/30/2023 1622 Gross per 24 hour  Intake 850 ml  Output --  Net 850 ml      05/31/2023    5:05 AM 05/30/2023    1:21 PM 05/30/2023    6:46 AM  Last 3 Weights  Weight (lbs) 256 lb 6.3 oz 250 lb 236 lb 8.9 oz  Weight (kg) 116.3 kg 113.399 kg 107.3 kg      Telemetry    AFib with controlled rate - Personally Reviewed  ECG    Afib, LVH, LAD - Personally Reviewed  Physical Exam   GEN: appears uncomfortable.  Neck: No JVD Cardiac: IRRR, no murmurs, rubs, or gallops.  Respiratory: Clear to auscultation bilaterally. GI: Soft, nontender, non-distended  MS: No edema; No deformity. Neuro:  Nonfocal  Psych: Normal affect   Labs    High Sensitivity Troponin:  No results for input(s): "TROPONINIHS" in the last 720 hours.   Chemistry Recent Labs  Lab 05/28/23 0343 05/29/23 0324 05/30/23 0016   NA 138 136 141  K 5.1 4.0 4.3  CL 103 102 104  CO2 22 26 29   GLUCOSE 121* 101* 93  BUN 35* 32* 28*  CREATININE 1.91* 1.61* 1.55*  CALCIUM 9.8 9.4 9.6  PROT 6.8 6.0* 6.1*  ALBUMIN 3.5 3.3* 3.2*  AST 291* 182* 119*  ALT 282* 251* 194*  ALKPHOS 53 54 51  BILITOT 5.5* 3.3* 2.7*  GFRNONAA 36* 44* 46*  ANIONGAP 13 8 8     Lipids No results for input(s): "CHOL", "TRIG", "HDL", "LABVLDL", "LDLCALC", "CHOLHDL" in the last 168 hours.  Hematology Recent Labs  Lab 05/29/23 0324 05/30/23 0016 05/30/23 2335  WBC 9.5 8.5 9.1  RBC 4.54 4.42 4.74  HGB 14.1 13.7 15.0  HCT 43.1 43.3 46.2  MCV 94.9 98.0 97.5  MCH 31.1 31.0 31.6  MCHC 32.7 31.6 32.5  RDW 14.0 13.9 13.8  PLT 135* 129* 142*   Thyroid  Recent Labs  Lab 05/30/23 0016  TSH 0.746    BNPNo results for input(s): "BNP", "PROBNP" in the last 168 hours.  DDimer No results for input(s): "DDIMER" in the last 168 hours.   Radiology    DG ERCP  Result Date: 05/31/2023  CLINICAL DATA:  Cholelithiasis, choledocholithiasis EXAM: ERCP TECHNIQUE: Multiple spot images obtained with the fluoroscopic device and submitted for interpretation post-procedure. FLUOROSCOPY: Radiation Exposure Index (as provided by the fluoroscopic device): 57.1 mGy Kerma COMPARISON:  MRCP 05/29/2023 FINDINGS: A total of 4 intraoperative saved images are submitted for review. The images demonstrate a flexible duodenal scope in the descending duodenum. The images document placement of a plastic stent in the pancreatic duct. IMPRESSION: ERCP with plastic pancreatic duct stent placement. These images were submitted for radiologic interpretation only. Please see the procedural report for the amount of contrast and the fluoroscopy time utilized. Electronically Signed   By: Malachy Moan M.D.   On: 05/31/2023 08:16   DG C-Arm 1-60 Min-No Report  Result Date: 05/30/2023 Fluoroscopy was utilized by the requesting physician.  No radiographic interpretation.   MR  ABDOMEN MRCP W WO CONTAST  Result Date: 05/29/2023 CLINICAL DATA:  Biliary obstruction suspected, pancreas mass, CBD obstruction EXAM: MRI ABDOMEN WITHOUT AND WITH CONTRAST (INCLUDING MRCP) TECHNIQUE: Multiplanar multisequence MR imaging of the abdomen was performed both before and after the administration of intravenous contrast. Heavily T2-weighted images of the biliary and pancreatic ducts were obtained, and three-dimensional MRCP images were rendered by post processing. CONTRAST:  10mL GADAVIST GADOBUTROL 1 MMOL/ML IV SOLN COMPARISON:  Right upper quadrant ultrasound, 05/28/2023, CT chest abdomen pelvis angiogram, 05/28/2023 FINDINGS: Lower chest: No acute abnormality. Hepatobiliary: Multiple benign liver cysts. Intrinsically T2 hyperintense lesion of the posteromedial right lobe of the liver, hepatic segment VII, with a somewhat reticular, progressive peripheral contrast enhancement pattern, measuring 3.9 x 3.1 cm (series 902, image 67). Small gallstones in the gallbladder. No gallbladder wall thickening. Mild inflammatory fat stranding and fluid in the gallbladder fossa (series 4, image 16). Mild intra and extrahepatic biliary ductal dilatation, common bile duct measuring up to 0.8 cm in caliber. Sludge and tiny gallstones in the central common bile duct near the ampulla, measuring no greater than 0.3 cm (series 3, image 31, series 2, image 21). Pancreas: Multiple simple and thinly septated fluid signal cystic lesions throughout the pancreas, largest in the dorsal pancreatic tail, communicating with the main pancreatic duct, measuring 2.3 x 2.1 cm (series 3, image 27). No solid component or suspicious contrast enhancement. No pancreatic ductal dilatation or surrounding inflammatory changes. Spleen: Normal in size without significant abnormality. Adrenals/Urinary Tract: Macroscopic fat containing, benign bilateral adrenal adenomata, requiring no specific further follow-up or characterization. Multiple simple  and thinly septated benign bilateral renal cortical cysts, for which no specific further follow-up or characterization is required. Kidneys are otherwise normal, without obvious renal calculi, solid lesion, or hydronephrosis. Stomach/Bowel: Stomach is within normal limits. No evidence of bowel wall thickening, distention, or inflammatory changes. Vascular/Lymphatic: No significant vascular findings are present. No enlarged abdominal lymph nodes. Other: No abdominal wall hernia or abnormality. No ascites. Musculoskeletal: No acute or significant osseous findings. IMPRESSION: 1. Mild intra and extrahepatic biliary ductal dilatation, common bile duct measuring up to 0.8 cm in caliber. Sludge and tiny gallstones in the central common bile duct near the ampulla, measuring no greater than 0.3 cm. 2. Cholelithiasis with mild inflammatory fat stranding and fluid in the gallbladder fossa, consistent with acute cholecystitis. 3. Multiple simple and thinly septated fluid signal cystic lesions throughout the pancreas, largest in the dorsal pancreatic tail, communicating with the main pancreatic duct, measuring 2.3 x 2.1 cm. No solid component or suspicious contrast enhancement. No pancreatic ductal dilatation or surrounding inflammatory changes. These are most consistent with side branch  IPMNs. Recommend follow-up MRI/MRCP in 1 year to ensure stability. 4. Lesion of the posteromedial right lobe of the liver, hepatic segment VII, with a somewhat reticular, progressive peripheral contrast enhancement pattern, measuring 3.9 x 3.1 cm. This is most consistent with an atypical, although benign hemangioma. Attention on follow-up with above recommended examination in 1 year. Electronically Signed   By: Jearld Lesch M.D.   On: 05/29/2023 14:00   MR 3D Recon At Scanner  Result Date: 05/29/2023 CLINICAL DATA:  Biliary obstruction suspected, pancreas mass, CBD obstruction EXAM: MRI ABDOMEN WITHOUT AND WITH CONTRAST (INCLUDING MRCP)  TECHNIQUE: Multiplanar multisequence MR imaging of the abdomen was performed both before and after the administration of intravenous contrast. Heavily T2-weighted images of the biliary and pancreatic ducts were obtained, and three-dimensional MRCP images were rendered by post processing. CONTRAST:  10mL GADAVIST GADOBUTROL 1 MMOL/ML IV SOLN COMPARISON:  Right upper quadrant ultrasound, 05/28/2023, CT chest abdomen pelvis angiogram, 05/28/2023 FINDINGS: Lower chest: No acute abnormality. Hepatobiliary: Multiple benign liver cysts. Intrinsically T2 hyperintense lesion of the posteromedial right lobe of the liver, hepatic segment VII, with a somewhat reticular, progressive peripheral contrast enhancement pattern, measuring 3.9 x 3.1 cm (series 902, image 67). Small gallstones in the gallbladder. No gallbladder wall thickening. Mild inflammatory fat stranding and fluid in the gallbladder fossa (series 4, image 16). Mild intra and extrahepatic biliary ductal dilatation, common bile duct measuring up to 0.8 cm in caliber. Sludge and tiny gallstones in the central common bile duct near the ampulla, measuring no greater than 0.3 cm (series 3, image 31, series 2, image 21). Pancreas: Multiple simple and thinly septated fluid signal cystic lesions throughout the pancreas, largest in the dorsal pancreatic tail, communicating with the main pancreatic duct, measuring 2.3 x 2.1 cm (series 3, image 27). No solid component or suspicious contrast enhancement. No pancreatic ductal dilatation or surrounding inflammatory changes. Spleen: Normal in size without significant abnormality. Adrenals/Urinary Tract: Macroscopic fat containing, benign bilateral adrenal adenomata, requiring no specific further follow-up or characterization. Multiple simple and thinly septated benign bilateral renal cortical cysts, for which no specific further follow-up or characterization is required. Kidneys are otherwise normal, without obvious renal calculi,  solid lesion, or hydronephrosis. Stomach/Bowel: Stomach is within normal limits. No evidence of bowel wall thickening, distention, or inflammatory changes. Vascular/Lymphatic: No significant vascular findings are present. No enlarged abdominal lymph nodes. Other: No abdominal wall hernia or abnormality. No ascites. Musculoskeletal: No acute or significant osseous findings. IMPRESSION: 1. Mild intra and extrahepatic biliary ductal dilatation, common bile duct measuring up to 0.8 cm in caliber. Sludge and tiny gallstones in the central common bile duct near the ampulla, measuring no greater than 0.3 cm. 2. Cholelithiasis with mild inflammatory fat stranding and fluid in the gallbladder fossa, consistent with acute cholecystitis. 3. Multiple simple and thinly septated fluid signal cystic lesions throughout the pancreas, largest in the dorsal pancreatic tail, communicating with the main pancreatic duct, measuring 2.3 x 2.1 cm. No solid component or suspicious contrast enhancement. No pancreatic ductal dilatation or surrounding inflammatory changes. These are most consistent with side branch IPMNs. Recommend follow-up MRI/MRCP in 1 year to ensure stability. 4. Lesion of the posteromedial right lobe of the liver, hepatic segment VII, with a somewhat reticular, progressive peripheral contrast enhancement pattern, measuring 3.9 x 3.1 cm. This is most consistent with an atypical, although benign hemangioma. Attention on follow-up with above recommended examination in 1 year. Electronically Signed   By: Jearld Lesch M.D.   On: 05/29/2023  14:00    Cardiac Studies   Echo 09/25/22: INTERPRETATION ---------------------------------------------------------------    NORMAL LEFT VENTRICULAR SYSTOLIC FUNCTION WITH MODERATE LVH    NORMAL RIGHT VENTRICULAR SYSTOLIC FUNCTION    VALVULAR REGURGITATION: MILD AR, TRIVIAL MR, TRIVIAL PR, MILD TR    NO VALVULAR STENOSIS    DILATED ASCENDING AORTA (4.6 cm) WITH MILD AORTIC  REGURGITATION    PATIENT IN ABNORMAL RHYTHM THROUGHOUT EXAM     Compared with prior Echo study on 08/03/2020: [facility])    NO SIGNIFICANT CHANGES   Patient Profile     77 y.o. male with acute cholelithiasis and ongoing biliary colic seen for evaluation of AFib  Assessment & Plan    Atrial fib. Duration unclear but apparently noted during ERCP today. Unclear from notes if in Afib when placed on monitor or developed during procedure. Last documented Ecg was 2021. His rate is controlled (has not received metoprolol since admission). He is completely asymptomatic. [place] Vasc score of 4. Prior Echo in November showed normal LV function with marked LAE. The fact that he has severe LAE it may difficult to restore NSR. At this point there is no reason that Afib should delay his cholecystectomy. Post op would recommend stopping ASA and starting on anticoagulation with Eliquis once safe to anticoagulate from a surgery standpoint. Would update Echo but this does not need to delay surgery. Later as outpatient could determine if we need to attempt to restore NSR. I would not resume metoprolol post op since HR on low end of normal.  CAD. Extensive coronary calcification noted on CT. No anginal symptoms. No history of MI or stent. Again given lack of symptoms may proceed with surgery without further ischemic work up. Focus for now on risk factor modification. HTN. All BP meds held on admission. Would continue to hold ACEi and aldactone given CKD. Renal indices are better. May be able to resume post op. Will resume amlodipine since hypertensive. Hold beta blocker as noted above AAA followed at [facility].  HLD excellent control on atorvastatin at home. Would continue. LFT elevation is due to gallstones.          For questions or updates, please contact [facility] Please consult [email or web address] for contact info under        Signed, Derenda Giddings [place], MD  05/31/2023, 8:38 AM

## 2023-05-31 NOTE — Progress Notes (Signed)
   Patient Name: Alexander Howe Date of Encounter: 05/31/2023, 2:27 PM     Assessment and Plan  Post-ERCP pancreatitis  Choledocholithiasis, Cholelithiasis   -------------------------------------------------------------------------------------------------------  Bowel rest Recheck labs KUB to check PD stent today When better can reattempt ERCP - would be best to have someone who can do needle-knife sphincterotomy If not improving tomorrow - likely check CT    Subjective  Had epigastric pain w/ N/V since last night Says passing flatus Daughter present   Objective  BP (!) 159/75 (BP Location: Right Arm)   Pulse (!) 56   Temp (!) 97.4 F (36.3 C) (Oral)   Resp 18   Ht 6\' 1"  (1.854 m)   Wt 116.3 kg   SpO2 100%   BMI 33.83 kg/m  Obese wm in bed Abd obese soft and mod tender epigastrium, BS   Lab Results  Component Value Date   LIPASE 5,934 (H) 05/31/2023   Recent Labs  Lab 05/27/23 2247 05/28/23 0343 05/28/23 0408 05/29/23 0324 05/30/23 0016 05/31/23 0806  AST 276* 291*  --  182* 119* 86*  ALT 240* 282*  --  251* 194* 153*  ALKPHOS 52 53  --  54 51 53  BILITOT 5.7* 5.5*  --  3.3* 2.7* 4.1*  PROT 7.7 6.8  --  6.0* 6.1* 6.7  ALBUMIN 4.2 3.5  --  3.3* 3.2* 3.6  INR  --   --  1.1  --   --   --     Recent Labs  Lab 05/27/23 2043 05/28/23 0343 05/29/23 0324 05/30/23 0016 05/31/23 0806  NA 138 138 136 141 138  K 4.7 5.1 4.0 4.3 3.4*  CL 96* 103 102 104 102  CO2 30 22 26 29 27   GLUCOSE 140* 121* 101* 93 115*  BUN 37* 35* 32* 28* 25*  CREATININE 2.03* 1.91* 1.61* 1.55* 1.59*  CALCIUM 10.4* 9.8 9.4 9.6 9.8  MG  --   --   --   --  2.0   Recent Labs  Lab 05/29/23 0324 05/30/23 0016 05/30/23 2335  HGB 14.1 13.7 15.0  HCT 43.1 43.3 46.2  WBC 9.5 8.5 9.1  PLT 135* 129* 142*     Iva Boop, MD, [facility] See AMION on call - gastroenterology for best contact person 05/31/2023 2:27 PM

## 2023-05-31 NOTE — Progress Notes (Addendum)
PROGRESS NOTE  Alexander Howe WUJ:[id number] DOB: 1946-09-13 DOA: 05/27/2023 PCP: [name], MD  HPI/Recap of past 72 hours:  77 year old with history of depression, anxiety, AAA and descending aortic aneurysm, hypertension, hyperlipidemia, CKD stage IIIb, obstructive sleep apnea and history of nephrolithiasis presented with abdominal pain, nausea and vomiting for 1 day. Shortly started after eating. In the emergency room he was afebrile. WC count 12.5. Creatinine 2.03. Mildly elevated transaminases. Total bilirubin 5.7, direct bilirubin 1.8. CT scan notable for calculi of the urinary bladder without obstruction, multiple low-density pancreatic lesions, cholelithiasis without acute cholecystitis with normal CBD diameter. Admitted with IV fluids, pain medications and nausea medications.  MRCP performed 7/16 with mild intra and extrahepatic biliary ductal dilation noted and CBD stones.  GI planning for ERCP today and plans for laparoscopic cholecystectomy on 7/18.   06/01/2023: The patient was seen and examined with his wife at his bedside.  Endorses abdominal pain and nausea with vomiting this morning.  Also very anxious.  Home Xanax was started.  Will start gentle IV fluid to avoid dehydration.  Assessment/Plan: Principal Problem:   Elevated LFTs Active Problems:   Anxiety   Chronic kidney disease, stage 3 unspecified (HCC)   Benign essential hypertension   Depression   OSA on CPAP   Elevated bilirubin   Generalized abdominal pain   Cholelithiasis with choledocholithiasis   IPMN (intraductal papillary mucinous neoplasm)   Gastritis and gastroduodenitis   Atrial fibrillation (HCC)  Acute right upper quadrant abdominal pain Choledocholithiasis seen on MRCP Status post ERCP on 05/30/2023.  Failed attempt at a cholangiogram 1 plastic stent placed into the ventral pancreatic duct. Per GI, ok to do lap chole and do IOC and if stones removed later.   Observe and recheck MRCP and  decide next steps. Repeat ERCP in 2 days on 06/01/2023 and decide next steps. Appreciate GI and general surgery's assistance Possible lap chole on 05/31/2023. As needed antiemetics As needed analgesics  Acute pancreatitis Lipase level 5934 IV fluid hydration Supportive care  Duodenitis Biopsied gastritis seen on EGD IV Protonix daily as recommended by GI Follow pathology results  Pancreatic tail cystic lesion Repeat MRI in 1 year as recommended by GI.  New onset atrial fibrillation with slow ventricular response Seen by cardiology Appreciate cardiologist's assistance Per cardiology, stop home Toprol-XL since heart rate on the lower side of normal.  Nausea and vomiting Supportive care IV antiemetics Gentle IV fluid hydration LR KCl 40 mill equivalent at 50 cc/h x 1 day, lipase level returned elevated greater than 5000, will increase IV fluids rate to 100 cc/h.  Essential hypertension: BP is not at goal, elevated Hold off ACE inhibitor and Aldactone per cardiology due to CKD Okay to resume home Norvasc Closely monitor vital signs.  Chronic anxiety Resume home Xanax and Paxil  Renal calculi/urinary bladder calculi: Patient has multiple urinary bladder calculi however without urinary symptoms.  No hydronephrosis or obstruction.  Monitor for symptoms.   Resolved acute kidney injury on CKD stage IIIb: Baseline about 1.7.  Maintenance fluid.  AKI appears to have resolved, follow labs in a.m.   Obstructive sleep apnea: To use CPAP at night.   Obesity BMI 33 Recommend weight loss outpatient with regular physical activity and healthy diet.  Hypokalemia Serum potassium 3.4 Repleted intravenously with LR KCl 40 mill equivalent at 50 cc/h x 1 day Serum magnesium 2.0.  Chronic HFpEF Echocardiogram done on 05/31/2023 revealed LVEF 60-65% and severely dilated left atrium. Start strict I's  and O's and daily weight Closely monitor volume status while on IV fluid.   Time: 55  minutes.    Code Status: Full code  Family Communication: Wife at bedside.  Disposition Plan: Likely will discharge to home once general surgery, cardiology and GI sign off.   Consultants: Cardiology GI General surgery.  Procedures: ERCP  Antimicrobials: None.  DVT prophylaxis: Chemical DVT prophylaxis held due to possible surgical procedure on 05/31/2023.  Status is: Inpatient The patient requires at least 2 midnights for further evaluation and treatment of present condition.    Objective: Vitals:   05/30/23 1944 05/30/23 2307 05/31/23 0505 05/31/23 0735  BP: (!) 162/95  (!) 165/83 (!) 162/95  Pulse: 68 68  (!) 59  Resp: 16 15 16    Temp: 97.6 F (36.4 C)  (!) 97.5 F (36.4 C) 97.8 F (36.6 C)  TempSrc: Oral  Oral Oral  SpO2: 98% 98% 99% 100%  Weight:   116.3 kg   Height:        Intake/Output Summary (Last 24 hours) at 05/31/2023 1043 Last data filed at 05/30/2023 1622 Gross per 24 hour  Intake 850 ml  Output --  Net 850 ml   Filed Weights   05/30/23 0646 05/30/23 1321 05/31/23 0505  Weight: 107.3 kg 113.4 kg 116.3 kg    Exam:  General: 77 y.o. year-old male well developed well nourished in no acute distress.  Alert and oriented x3. Cardiovascular: Irregular rate and rhythm with no rubs or gallops.  No thyromegaly or JVD noted.   Respiratory: Clear to auscultation with no wheezes or rales. Good inspiratory effort. Abdomen: Soft tender nondistended with normal bowel sounds x4 quadrants. Musculoskeletal: No lower extremity edema. 2/4 pulses in all 4 extremities. Skin: No ulcerative lesions noted or rashes, Psychiatry: Mood is appropriate for condition and setting   Data Reviewed: CBC: Recent Labs  Lab 05/27/23 2043 05/28/23 0343 05/29/23 0324 05/30/23 0016 05/30/23 2335  WBC 12.5* 11.2* 9.5 8.5 9.1  HGB 16.5 16.6 14.1 13.7 15.0  HCT 50.7 50.7 43.1 43.3 46.2  MCV 96.9 98.1 94.9 98.0 97.5  PLT 166 149* 135* 129* 142*   Basic Metabolic  Panel: Recent Labs  Lab 05/27/23 2043 05/28/23 0343 05/29/23 0324 05/30/23 0016 05/31/23 0806  NA 138 138 136 141 138  K 4.7 5.1 4.0 4.3 3.4*  CL 96* 103 102 104 102  CO2 30 22 26 29 27   GLUCOSE 140* 121* 101* 93 115*  BUN 37* 35* 32* 28* 25*  CREATININE 2.03* 1.91* 1.61* 1.55* 1.59*  CALCIUM 10.4* 9.8 9.4 9.6 9.8  MG  --   --   --   --  2.0   GFR: Estimated Creatinine Clearance: 52.8 mL/min (A) (by C-G formula based on SCr of 1.59 mg/dL (H)). Liver Function Tests: Recent Labs  Lab 05/27/23 2247 05/28/23 0343 05/29/23 0324 05/30/23 0016 05/31/23 0806  AST 276* 291* 182* 119* 86*  ALT 240* 282* 251* 194* 153*  ALKPHOS 52 53 54 51 53  BILITOT 5.7* 5.5* 3.3* 2.7* 4.1*  PROT 7.7 6.8 6.0* 6.1* 6.7  ALBUMIN 4.2 3.5 3.3* 3.2* 3.6   Recent Labs  Lab 05/27/23 2247  LIPASE 40   No results for input(s): "AMMONIA" in the last 168 hours. Coagulation Profile: Recent Labs  Lab 05/28/23 0408  INR 1.1   Cardiac Enzymes: No results for input(s): "CKTOTAL", "CKMB", "CKMBINDEX", "TROPONINI" in the last 168 hours. BNP (last 3 results) No results for input(s): "PROBNP" in the last 8760  hours. HbA1C: No results for input(s): "HGBA1C" in the last 72 hours. CBG: No results for input(s): "GLUCAP" in the last 168 hours. Lipid Profile: No results for input(s): "CHOL", "HDL", "LDLCALC", "TRIG", "CHOLHDL", "LDLDIRECT" in the last 72 hours. Thyroid Function Tests: Recent Labs    05/30/23 0016  TSH 0.746   Anemia Panel: No results for input(s): "VITAMINB12", "FOLATE", "FERRITIN", "TIBC", "IRON", "RETICCTPCT" in the last 72 hours. Urine analysis:    Component Value Date/Time   COLORURINE YELLOW 05/27/2023 2044   APPEARANCEUR CLEAR 05/27/2023 2044   APPEARANCEUR Hazy 09/30/2012 0956   LABSPEC 1.012 05/27/2023 2044   LABSPEC 1.019 09/30/2012 0956   PHURINE 7.0 05/27/2023 2044   GLUCOSEU NEGATIVE 05/27/2023 2044   GLUCOSEU Negative 09/30/2012 0956   HGBUR NEGATIVE 05/27/2023  2044   BILIRUBINUR NEGATIVE 05/27/2023 2044   BILIRUBINUR 1+ 09/30/2012 0956   KETONESUR NEGATIVE 05/27/2023 2044   PROTEINUR 30 (A) 05/27/2023 2044   NITRITE NEGATIVE 05/27/2023 2044   LEUKOCYTESUR NEGATIVE 05/27/2023 2044   LEUKOCYTESUR Negative 09/30/2012 0956   Sepsis Labs: @LABRCNTIP (procalcitonin:4,lacticidven:4)  )No results found for this or any previous visit (from the past 240 hour(s)).    Studies: ECHOCARDIOGRAM COMPLETE  Result Date: 05/31/2023    ECHOCARDIOGRAM REPORT   Patient Name:   HERLEY BERNARDINI Date of Exam: 05/31/2023 Medical Rec #:  [id number]      Height:       73.0 in Accession #:    [id number]     Weight:       256.4 lb Date of Birth:  10-Jan-1946     BSA:          2.391 m Patient Age:    76 years       BP:           162/95 mmHg Patient Gender: M              HR:           60 bpm. Exam Location:  Inpatient Procedure: 2D Echo, Cardiac Doppler and Color Doppler Indications:    Atrial Fibrillation  History:        Patient has prior history of Echocardiogram examinations, most                 recent 09/25/2022. CKD, AAA, hx cancer, Arrythmias:Atrial                 Fibrillation; Risk Factors:Hypertension, Sleep Apnea, Former                 Smoker, hx rheumatic fever and Dyslipidemia.  Sonographer:    Wallie Char Referring Phys: [id number] PRATIK D [facility]  Sonographer Comments: Image acquisition challenging due to patient body habitus. IMPRESSIONS  1. Left ventricular ejection fraction, by estimation, is 60 to 65%. The left ventricle has normal function. The left ventricle has no regional wall motion abnormalities. There is mild asymmetric left ventricular hypertrophy of the basal-septal segment. Left ventricular diastolic function could not be evaluated.  2. Right ventricular systolic function is normal. The right ventricular size is normal.  3. Left atrial size was severely dilated.  4. Right atrial size was mild to moderately dilated.  5. The mitral valve is normal in structure.  No evidence of mitral valve regurgitation. No evidence of mitral stenosis.  6. The aortic valve is tricuspid. There is mild calcification of the aortic valve. Aortic valve regurgitation is trivial. Aortic valve sclerosis/calcification is present, without any evidence of aortic stenosis.  7. Aortic  dilatation noted. There is borderline dilatation of the aortic root, measuring 39 mm. 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  8. The inferior vena cava is normal in size with greater than 50% respiratory variability, suggesting right atrial pressure of 3 mmHg. FINDINGS  Left Ventricle: Left ventricular ejection fraction, by estimation, is 60 to 65%. The left ventricle has normal function. The left ventricle has no regional wall motion abnormalities. The left ventricular internal cavity size was normal in size. There is  mild asymmetric left ventricular hypertrophy of the basal-septal segment. Left ventricular diastolic function could not be evaluated due to atrial fibrillation. Left ventricular diastolic function could not be evaluated. Right Ventricle: The right ventricular size is normal. No increase in right ventricular wall thickness. Right ventricular systolic function is normal. Left Atrium: Left atrial size was severely dilated. Right Atrium: Right atrial size was mild to moderately dilated. Pericardium: There is no evidence of pericardial effusion. Mitral Valve: The mitral valve is normal in structure. Mild mitral annular calcification. No evidence of mitral valve regurgitation. No evidence of mitral valve stenosis. MV peak gradient, 3.9 mmHg. The mean mitral valve gradient is 1.0 mmHg. Tricuspid Valve: The tricuspid valve is normal in structure. Tricuspid valve regurgitation is trivial. No evidence of tricuspid stenosis. Aortic Valve: The aortic valve is tricuspid. There is mild calcification of the aortic valve. Aortic valve regurgitation is trivial. Aortic valve  sclerosis/calcification is present, without any evidence of aortic stenosis. Aortic valve mean gradient measures 3.7 mmHg. Aortic valve peak gradient measures 7.0 mmHg. Aortic valve area, by VTI measures 2.88 cm. Pulmonic Valve: The pulmonic valve was normal in structure. Pulmonic valve regurgitation is trivial. No evidence of pulmonic stenosis. Aorta: Aortic dilatation noted. There is borderline dilatation of the aortic root, measuring 39 mm. 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 Venous: The inferior vena cava is normal in size with greater than 50% respiratory variability, suggesting right atrial pressure of 3 mmHg. IAS/Shunts: No atrial level shunt detected by color flow Doppler.  LEFT VENTRICLE PLAX 2D LVIDd:         5.80 cm      Diastology LVIDs:         3.90 cm      LV e' medial:    9.58 cm/s LV PW:         1.00 cm      LV E/e' medial:  10.5 LV IVS:        1.40 cm      LV e' lateral:   13.73 cm/s LVOT diam:     2.20 cm      LV E/e' lateral: 7.3 LV SV:         77 LV SV Index:   32 LVOT Area:     3.80 cm  LV Volumes (MOD) LV vol d, MOD A2C: 95.8 ml LV vol d, MOD A4C: 147.0 ml LV vol s, MOD A2C: 38.5 ml LV vol s, MOD A4C: 56.7 ml LV SV MOD A2C:     57.3 ml LV SV MOD A4C:     147.0 ml LV SV MOD BP:      79.4 ml RIGHT VENTRICLE             IVC RV Basal diam:  4.10 cm     IVC diam: 1.40 cm RV S prime:     13.53 cm/s TAPSE (M-mode): 2.5 cm LEFT ATRIUM  Index        RIGHT ATRIUM           Index LA diam:        6.40 cm  2.68 cm/m   RA Area:     25.00 cm LA Vol (A2C):   155.0 ml 64.82 ml/m  RA Volume:   68.50 ml  28.65 ml/m LA Vol (A4C):   137.0 ml 57.29 ml/m LA Biplane Vol: 152.0 ml 63.57 ml/m  AORTIC VALVE AV Area (Vmax):    2.47 cm AV Area (Vmean):   2.74 cm AV Area (VTI):     2.88 cm AV Vmax:           132.00 cm/s AV Vmean:          83.567 cm/s AV VTI:            0.269 m AV Peak Grad:      7.0 mmHg AV Mean Grad:      3.7 mmHg LVOT Vmax:         85.77 cm/s LVOT  Vmean:        60.167 cm/s LVOT VTI:          0.204 m LVOT/AV VTI ratio: 0.76  AORTA Ao Root diam: 3.90 cm Ao Asc diam:  4.60 cm MITRAL VALVE MV Area (PHT): 6.09 cm     SHUNTS MV Area VTI:   3.34 cm     Systemic VTI:  0.20 m MV Peak grad:  3.9 mmHg     Systemic Diam: 2.20 cm MV Mean grad:  1.0 mmHg MV Vmax:       0.98 m/s MV Vmean:      47.7 cm/s MV Decel Time: 125 msec MV E velocity: 100.40 cm/s Arvilla Meres MD Electronically signed by Arvilla Meres MD Signature Date/Time: 05/31/2023/10:10:24 AM    Final    DG ERCP  Result Date: 05/31/2023 CLINICAL DATA:  Cholelithiasis, choledocholithiasis EXAM: ERCP TECHNIQUE: Multiple spot images obtained with the fluoroscopic device and submitted for interpretation post-procedure. FLUOROSCOPY: Radiation Exposure Index (as provided by the fluoroscopic device): 57.1 mGy Kerma COMPARISON:  MRCP 05/29/2023 FINDINGS: A total of 4 intraoperative saved images are submitted for review. The images demonstrate a flexible duodenal scope in the descending duodenum. The images document placement of a plastic stent in the pancreatic duct. IMPRESSION: ERCP with plastic pancreatic duct stent placement. These images were submitted for radiologic interpretation only. Please see the procedural report for the amount of contrast and the fluoroscopy time utilized. Electronically Signed   By: Malachy Moan M.D.   On: 05/31/2023 08:16   DG C-Arm 1-60 Min-No Report  Result Date: 05/30/2023 Fluoroscopy was utilized by the requesting physician.  No radiographic interpretation.    Scheduled Meds:  amLODipine  5 mg Oral Daily   diclofenac  100 mg Rectal Once   LORazepam  1 mg Oral Once   metoprolol succinate  100 mg Oral QHS   pantoprazole (PROTONIX) IV  40 mg Intravenous Q24H   PARoxetine  10 mg Oral Daily   sodium chloride flush  3 mL Intravenous Q12H    Continuous Infusions:   LOS: 3 days     Darlin Drop, MD [facility] Pager [phone]  If 7PM-7AM,  please contact night-coverage [email or web address] Password [facility] 05/31/2023, 10:43 AM

## 2023-05-31 NOTE — Significant Event (Signed)
Received phone call from central telemetry at 1440 stating pt was in A.fib with HR dropping to 39.  Went to go check in on pt and her was asleep laying on his right side.  Awoke pt and asked pt if her was ok.  Pt denied c/o SOB/DOE, double vision, lightheadedness, palpation or chest pain.  Also notice when obtaining pt's VS pt's HR dropped into the 20's while speaking with GI, MD.  Secure chatted Margo Aye, MD to notify her of finding.

## 2023-05-31 NOTE — Progress Notes (Signed)
Pharmacy Antibiotic Note  Alexander Howe is a 77 y.o. male admitted on 05/27/2023 with acute cholangitis.  Pharmacy has been consulted for Zosyn dosing.  Plan: Zosyn 3.375g IV Q8h Trend WBC, fever, renal function F/u cultures, clinical progress, levels as indicated De-escalate when able  Height: 6\' 1"  (185.4 cm) Weight: 116.3 kg (256 lb 6.3 oz) IBW/kg (Calculated) : 79.9  Temp (24hrs), Avg:97.6 F (36.4 C), Min:97.4 F (36.3 C), Max:97.8 F (36.6 C)  Recent Labs  Lab 05/27/23 2043 05/28/23 0343 05/29/23 0324 05/30/23 0016 05/30/23 2335 05/31/23 0806 05/31/23 1510  WBC 12.5* 11.2* 9.5 8.5 9.1  --  14.8*  CREATININE 2.03* 1.91* 1.61* 1.55*  --  1.59*  --     Estimated Creatinine Clearance: 52.8 mL/min (A) (by C-G formula based on SCr of 1.59 mg/dL (H)).    Allergies  Allergen Reactions   Pneumococcal Vaccines Swelling    Antimicrobials this admission: Unasyn 7/17  Thank you for allowing pharmacy to be a part of this patient's care.  Carrington Clamp 05/31/2023 4:21 PM

## 2023-05-31 NOTE — Care Management Important Message (Signed)
Important Message  Patient Details  Name: Alexander Howe MRN: [id number] Date of Birth: [birth year]   Medicare Important Message Given:  Yes     Dorena Bodo 05/31/2023, 2:27 PM

## 2023-05-31 NOTE — Plan of Care (Signed)
Problem: Education: Goal: Knowledge of General Education information will improve Description: Including pain rating scale, medication(s)/side effects and non-pharmacologic comfort measures Outcome: Progressing Pt understands her was admitted into the hospital abdominal pain n/v r/t cholecystitis and cholelithiasis.   Problem: Clinical Measurements: Goal: Ability to maintain clinical measurements within normal limits will improve Outcome: Progressing Pt's was hypertensive this shift and is intermittently bradycardic per telemetry monitoring with HR dropping into the 30's.  Margo Aye, MD secured chatted and notified.   Problem: Clinical Measurements: Goal: Will remain free from infection Outcome: Progressing S/Sx of infection monitored and assessed q-shift. Pt has remained afebrile thus far.  His WBC are elevated per his labs with it being 14.8 at 1510 on 05/31/2023.  He is now on IV abx per MD's orders,    Problem: Clinical Measurements: Goal: Respiratory complications will improve Outcome: Progressing Respiratory status monitored and assessed q-shift. Pt is on room air with O2 saturations at 98-100% and respirations rate of 11-25 breaths per minute. Pt has not endorsed c/o SOB or DOE.    Problem: Activity: Goal: Risk for activity intolerance will decrease Outcome: Progressing Pt is moderate assist of all his ADLs.  He x1 assist OOB to the bathroom.    Problem: Nutrition: Goal: Adequate nutrition will be maintained Outcome: Progressing Pt is on a NPO per MD's orders.   Problem: Elimination: Goal: Will not experience complications related to bowel motility Outcome: Progressing Pt's LBM was on 05/27/2023.  Pt has not endorsed c/o constipation but has endorsed c/o abdominal pain and n/v.     Problem: Elimination: Goal: Will not experience complications related to urinary retention Outcome: Progressing Pt is continent of his bladder.  He utilizes the urinal to capture his urine.  Pt has  not endorsed c/o dysuria or abdominal pain distention.    Problem: Pain Managment: Goal: General experience of comfort will improve Outcome: Progressing Pt has endorsed 5/10 abdominal pain describing it as a constant discomforting pain. Reiterated pain scale so he could adequately rate his pain. Pt stated his pain goal would be 2/10. Discussed nonpharmacological methods to help reduce s/sx of pain. Interventions given per pt's request and MD's orders.    Problem: Safety: Goal: Ability to remain free from injury will improve Outcome: Progressing Pt has remained free from falls thus far.  Instructed pt to utilize RN call light for assistance.  Hourly rounds performed.  Bed in lowest position, locked with two upper side rails engaged.  Belongings and call light within reach.  Pt has a sitter within arms reach at all times d/t pt's history of SI.     Problem: Skin Integrity: Goal: Risk for impaired skin integrity will decrease Outcome: Progressing Skin integrity monitored and assessed q-shift. Instructed pt to turn reposition himself q2 hours to prevent further skin impairment. Tubes and drains assessed for device related pressure sores. Pt is continent of his bowel and bladder. Dressing changes performed per MD's orders.

## 2023-06-01 ENCOUNTER — Inpatient Hospital Stay (HOSPITAL_COMMUNITY): Payer: Medicare HMO

## 2023-06-01 ENCOUNTER — Other Ambulatory Visit: Payer: Self-pay

## 2023-06-01 DIAGNOSIS — I2584 Coronary atherosclerosis due to calcified coronary lesion: Secondary | ICD-10-CM | POA: Diagnosis not present

## 2023-06-01 DIAGNOSIS — K859 Acute pancreatitis without necrosis or infection, unspecified: Secondary | ICD-10-CM

## 2023-06-01 DIAGNOSIS — R7989 Other specified abnormal findings of blood chemistry: Secondary | ICD-10-CM | POA: Diagnosis not present

## 2023-06-01 DIAGNOSIS — I4819 Other persistent atrial fibrillation: Secondary | ICD-10-CM | POA: Diagnosis not present

## 2023-06-01 DIAGNOSIS — I2510 Atherosclerotic heart disease of native coronary artery without angina pectoris: Secondary | ICD-10-CM | POA: Diagnosis not present

## 2023-06-01 DIAGNOSIS — I10 Essential (primary) hypertension: Secondary | ICD-10-CM | POA: Diagnosis not present

## 2023-06-01 DIAGNOSIS — F418 Other specified anxiety disorders: Secondary | ICD-10-CM | POA: Diagnosis not present

## 2023-06-01 DIAGNOSIS — N183 Chronic kidney disease, stage 3 unspecified: Secondary | ICD-10-CM | POA: Diagnosis not present

## 2023-06-01 LAB — PHOSPHORUS: Phosphorus: 3 mg/dL (ref 2.5–4.6)

## 2023-06-01 LAB — MAGNESIUM: Magnesium: 1.7 mg/dL (ref 1.7–2.4)

## 2023-06-01 LAB — CBC
HCT: 43.6 % (ref 39.0–52.0)
Hemoglobin: 14.6 g/dL (ref 13.0–17.0)
MCH: 31.6 pg (ref 26.0–34.0)
MCHC: 33.5 g/dL (ref 30.0–36.0)
MCV: 94.4 fL (ref 80.0–100.0)
Platelets: 121 10*3/uL — ABNORMAL LOW (ref 150–400)
RBC: 4.62 MIL/uL (ref 4.22–5.81)
RDW: 14.3 % (ref 11.5–15.5)
WBC: 21.6 10*3/uL — ABNORMAL HIGH (ref 4.0–10.5)
nRBC: 0 % (ref 0.0–0.2)

## 2023-06-01 LAB — SURGICAL PATHOLOGY

## 2023-06-01 LAB — COMPREHENSIVE METABOLIC PANEL
ALT: 104 U/L — ABNORMAL HIGH (ref 0–44)
AST: 57 U/L — ABNORMAL HIGH (ref 15–41)
Albumin: 3.2 g/dL — ABNORMAL LOW (ref 3.5–5.0)
Alkaline Phosphatase: 40 U/L (ref 38–126)
Anion gap: 19 — ABNORMAL HIGH (ref 5–15)
BUN: 21 mg/dL (ref 8–23)
CO2: 20 mmol/L — ABNORMAL LOW (ref 22–32)
Calcium: 9.7 mg/dL (ref 8.9–10.3)
Chloride: 101 mmol/L (ref 98–111)
Creatinine, Ser: 1.51 mg/dL — ABNORMAL HIGH (ref 0.61–1.24)
GFR, Estimated: 48 mL/min — ABNORMAL LOW (ref >60)
Glucose, Bld: 85 mg/dL (ref 70–99)
Potassium: 4.2 mmol/L (ref 3.5–5.1)
Sodium: 140 mmol/L (ref 135–145)
Total Bilirubin: 6.1 mg/dL — ABNORMAL HIGH (ref 0.3–1.2)
Total Protein: 6.3 g/dL — ABNORMAL LOW (ref 6.5–8.1)

## 2023-06-01 LAB — LIPASE, BLOOD: Lipase: 1279 U/L — ABNORMAL HIGH (ref 11–51)

## 2023-06-01 LAB — GLUCOSE, CAPILLARY
Glucose-Capillary: 64 mg/dL — ABNORMAL LOW (ref 70–99)
Glucose-Capillary: 108 mg/dL — ABNORMAL HIGH (ref 70–99)

## 2023-06-01 MED ORDER — THIAMINE HCL 100 MG/ML IJ SOLN
100.0000 mg | Freq: Once | INTRAMUSCULAR | Status: AC
  Administered 2023-06-01: 100 mg via INTRAVENOUS

## 2023-06-01 MED ORDER — PROCHLORPERAZINE EDISYLATE 10 MG/2ML IJ SOLN
10.0000 mg | Freq: Four times a day (QID) | INTRAMUSCULAR | Status: DC | PRN
  Administered 2023-06-01 – 2023-06-02 (×3): 10 mg via INTRAVENOUS
  Filled 2023-06-01 (×2): qty 2

## 2023-06-01 MED ORDER — SODIUM CHLORIDE 0.9% FLUSH
10.0000 mL | INTRAVENOUS | Status: DC | PRN
  Administered 2023-06-02: 10 mL

## 2023-06-01 MED ORDER — INSULIN ASPART 100 UNIT/ML IJ SOLN: 0.0000 [IU] | INTRAMUSCULAR | Status: DC

## 2023-06-01 MED ORDER — DEXTROSE 10 % IV SOLN: INTRAVENOUS | Status: DC

## 2023-06-01 MED ORDER — IOHEXOL 9 MG/ML PO SOLN
ORAL | Status: AC
  Filled 2023-06-01: qty 1000

## 2023-06-01 MED ORDER — CHLORHEXIDINE GLUCONATE CLOTH 2 % EX PADS
6.0000 | MEDICATED_PAD | Freq: Every day | CUTANEOUS | Status: DC
  Administered 2023-06-01 – 2023-06-03 (×3): 6 via TOPICAL

## 2023-06-01 MED ORDER — TRAVASOL 10 % IV SOLN
INTRAVENOUS | Status: AC
  Filled 2023-06-01: qty 696

## 2023-06-01 MED ORDER — LACTATED RINGERS IV SOLN: INTRAVENOUS | Status: DC

## 2023-06-01 MED ORDER — SODIUM CHLORIDE 0.9% FLUSH
10.0000 mL | Freq: Two times a day (BID) | INTRAVENOUS | Status: DC
  Administered 2023-06-01: 20 mL
  Administered 2023-06-02 – 2023-06-03 (×2): 10 mL

## 2023-06-01 MED ORDER — THIAMINE HCL 100 MG/ML IJ SOLN
INTRAMUSCULAR | Status: AC
  Filled 2023-06-01: qty 2

## 2023-06-01 MED ORDER — HYDRALAZINE HCL 20 MG/ML IJ SOLN
5.0000 mg | Freq: Once | INTRAMUSCULAR | Status: AC
  Administered 2023-06-01: 5 mg via INTRAVENOUS

## 2023-06-01 NOTE — Plan of Care (Signed)

## 2023-06-01 NOTE — Progress Notes (Signed)
Initial Nutrition Assessment  DOCUMENTATION CODES:   Severe malnutrition in context of acute illness/injury  INTERVENTION:  Start TPN per pharmacy management   100 mg thiamine x 5 days due to risk of refeeding  -monitor K+, mag, and phos, MD to replete as necessary   RD recommends advancing to clear liquids when patient is able to tolerate and slowly advance diet as tolerated. Goal diet should be low fat   NUTRITION DIAGNOSIS:   Severe Malnutrition related to acute illness as evidenced by energy intake < or equal to 50% for > or equal to 5 days, percent weight loss.   GOAL:   Patient will meet greater than or equal to 90% of their needs   MONITOR:   I & O's, Skin, Diet advancement, Labs, Weight trends  REASON FOR ASSESSMENT:   Consult New TPN/TNA  ASSESSMENT:   77 y.o. year old male with a past medical history not limited to  adenomatous colon polyps, OSA on CPAP, kidney stones, CKD3, depression, anxiety, AAA, hypertension, hyperlipidemia , cholelithiasis who presents with abdominal pain, nausea and vomiting. MRCP showed cholelithiasis with choledocholithiasis, transaminitis and hyperbilirubinemia  and his ERCP showed pancreatitis  Starting TPN today for bowel rest  Patient has still be experiencing N/V and abdominal pain   Plans for cholecystectomy if patient's pancreatitis and choledocholithiasis improve/resolve   Labs: Glu 112, BUN 24, Cr 1.49 Meds: diclofenac suppositroy, ativan, protonix, zosyn, NS, phenergan, lactated ringers @100  ml/hr   Wt:  admit wt 257#, current- 250# -7# (3%) wt loss x 5 days  06/01/23 113.4 kg  12/09/20 116.6 kg  09/27/20 115.5 kg  04/05/18 117.9 kg   PO: patient has been NPO/CLLQ x 5 days  I/O's:  +1.9 L  NUTRITION - FOCUSED PHYSICAL EXAM:  RD working remotely   Diet Order:   Diet Order             Diet NPO time specified Except for: Sips with Meds  Diet effective midnight                   EDUCATION NEEDS:   No  education needs have been identified at this time  Skin:  Skin Assessment: Reviewed RN Assessment  Last BM:  7/14  Height:   Ht Readings from Last 1 Encounters:  05/30/23 6\' 1"  (1.854 m)    Weight:   Wt Readings from Last 1 Encounters:  06/01/23 113.4 kg    Ideal Body Weight:     BMI:  Body mass index is 32.97 kg/m.  Estimated Nutritional Needs:   Kcal:  0272-5366  Protein:  135-170 g  Fluid:  > 2L   Leodis Rains, RDN, LDN  Clinical Nutrition

## 2023-06-01 NOTE — Progress Notes (Signed)
   05/31/23 2215  BiPAP/CPAP/SIPAP  BiPAP/CPAP/SIPAP Pt Type Adult  Reason BIPAP/CPAP not in use Non-compliant (family says he wears at home but is not feeling well and does not want to wear tonight. Machine left in room.)

## 2023-06-01 NOTE — Progress Notes (Addendum)
Daily Progress Note  DOA: 05/27/2023 Hospital Day: 6 Chief Complaint: Choledocholithiasis , post-ERCP pancreatitis   Attending physician's note   I have taken a history, reviewed the chart and examined the patient. I performed a substantive portion of this encounter, including complete performance of at least one of the key components, in conjunction with the APP. I agree with the APP's note, impression and recommendations.   S/p ERCP mild pancreatitis   No longer vomiting. C/o left side abd pain radiating to back  CT suggestive of mild pancreatitis and gallbladder neck ?stone and inflammation consistent with cholecystitis PD stent in place  Waiting for bed for transfer to [facility] for ERCP Continue IV fluids Continue Zosyn Pain control and supportive care  The patient was provided an opportunity to ask questions and all were answered. The patient agreed with the plan and demonstrated an understanding of the instructions.   Iona Beard , MD [phone]    Assessment and Plan:    Brief Narrative:  Alexander Howe is a 77 y.o. year old male with a past medical history not limited to  adenomatous colon polyps, OSA on CPAP, kidney stones, CKD3, depression, anxiety, AAA, hypertension, hyperlipidemia , cholelithiasis  Cholelithiasis /acute cholecystitis ( by MRI), choledocholithiasis , status post ERCP yesterday.  Attempts at a cholangiogram failed, s/p plastic stent placement.   -Plan is for transfer to [facility] for repeat ERCP. TRH has talked with [facility], Dr. [name] from IM accepted but no beds at this time  Post-ERCP pancreatitis.  Having abdominal pain , distention and buldge in left lower back Awaiting CT scan   Gastritis / duodenitis.  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 and recommended one year follow up   Subjective / New Events:  Concerned about abdominal distention and bulge and discomfort in left lower  back  Objective:   Recent Labs    05/30/23 0016 05/30/23 2335 05/31/23 1510  WBC 8.5 9.1 14.8*  HGB 13.7 15.0 15.5  HCT 43.3 46.2 46.2  PLT 129* 142* 147*   BMET Recent Labs    05/30/23 0016 05/31/23 0806 05/31/23 1510  NA 141 138 136  K 4.3 3.4* 4.3  CL 104 102 102  CO2 29 27 26   GLUCOSE 93 115* 112*  BUN 28* 25* 24*  CREATININE 1.55* 1.59* 1.49*  CALCIUM 9.6 9.8 9.7   LFT Recent Labs    05/31/23 1510  PROT 6.5  ALBUMIN 3.6  AST 73*  ALT 139*  ALKPHOS 51  BILITOT 4.1*   PT/INR No results for input(s): "LABPROT", "INR" in the last 72 hours.   Imaging:  [place] EKG SITE RITE If Site Rite image not attached, placement could not be confirmed due to  current cardiac rhythm.     Scheduled inpatient medications:   amLODipine  5 mg Oral Daily   Chlorhexidine Gluconate Cloth  6 each Topical Daily   diclofenac  100 mg Rectal Once   insulin aspart  0-9 Units Subcutaneous Q4H   LORazepam  1 mg Oral Once   pantoprazole (PROTONIX) IV  40 mg Intravenous Q24H   PARoxetine  10 mg Oral Daily   sodium chloride flush  3 mL Intravenous Q12H   Continuous inpatient infusions:   lactated ringers     lactated ringers 75 mL/hr at 06/01/23 1648   piperacillin-tazobactam (ZOSYN)  IV 3.375 g (06/01/23 1712)   TPN ADULT (ION)     PRN inpatient  medications: acetaminophen, ALPRAZolam, fentaNYL (SUBLIMAZE) injection, hydrALAZINE, hydrALAZINE, morphine injection, ondansetron **OR** ondansetron (ZOFRAN) IV, prochlorperazine  Vital signs in last 24 hours: Temp:  [97.5 F (36.4 C)-98.6 F (37 C)] 98.6 F (37 C) (07/19 1637) Pulse Rate:  [60-86] 86 (07/19 1637) Resp:  [14-23] 21 (07/19 1637) BP: (146-184)/(62-103) 175/103 (07/19 1637) SpO2:  [96 %-100 %] 100 % (07/19 1637) Weight:  [113.4 kg] 113.4 kg (07/19 0513) Last BM Date : 05/29/23 (per wife)  Intake/Output Summary (Last 24 hours) at 06/01/2023 1727 Last data filed at 06/01/2023 1542 Gross per 24 hour  Intake 2416.85 ml   Output 650 ml  Net 1766.85 ml    Intake/Output from previous day: 07/18 0701 - 07/19 0700 In: 1222 [I.V.:1129; IV Piggyback:92.9] Out: 850 [Urine:850] Intake/Output this shift: Total I/O In: 1194.9 [I.V.:1144.9; IV Piggyback:50] Out: -    Physical Exam:  General: Alert male in NAD Heart:  Regular rate and rhythm.  Pulmonary: Normal respiratory effort Abdomen: Soft, mildly distended, a few bowel sounds. Mild generalized tenderness.  Normal bowel sounds. Extremities: No lower extremity edema  Neurologic: Alert and oriented Psych: Pleasant. Cooperative. Insight appears normal.    Principal Problem:   Elevated LFTs Active Problems:   Anxiety   Chronic kidney disease, stage 3 unspecified (HCC)   Benign essential hypertension   Depression   OSA on CPAP   Elevated bilirubin   Generalized abdominal pain   Cholelithiasis with choledocholithiasis   IPMN (intraductal papillary mucinous neoplasm)   Gastritis and gastroduodenitis   Atrial fibrillation (HCC)   Post-ERCP acute pancreatitis   Choledocholithiasis     LOS: 4 days   Willette Cluster ,NP 06/01/2023, 5:27 PM

## 2023-06-01 NOTE — Progress Notes (Signed)
The patient's wife and family requested transfer to [facility] if GI is unable to perform needed procedure to retrieve CBD stone.    Called [facility] transfer center and discussed the case with Dr.  [name] from Internal Medicine team at [facility].    Dr. [name] accepted the patient in transfer to [facility] but there are no beds available at this time.  Per Dr. [name] a bed maybe available Sunday or beginning of next week.  If condition worsens and the patient becomes hemodynamically unstable may consider IR percutaneous biliary drain at [facility] by Interverntional radiology if deemed appropriate.    The patient will transfer back to [facility] once his procedure is completed at [facility]   Time:  15 minutes

## 2023-06-01 NOTE — Progress Notes (Signed)
   06/01/23 2210  BiPAP/CPAP/SIPAP  Reason BIPAP/CPAP not in use Non-compliant (pt refused)  BiPAP/CPAP /SiPAP Vitals  Pulse Rate 97  Resp (!) 26  SpO2 93 %  MEWS Score/Color  MEWS Score 2  MEWS Score Color Yellow   Pt stated he does not want to wear tonight.

## 2023-06-01 NOTE — Progress Notes (Signed)
PHARMACY - TOTAL PARENTERAL NUTRITION CONSULT NOTE   Indication: Prolonged ileus  Patient Measurements: Height: 6\' 1"  (185.4 cm) Weight: 113.4 kg (249 lb 14.4 oz) IBW/kg (Calculated) : 79.9 TPN AdjBW (KG): 88.3 Body mass index is 32.97 kg/m. Usual Weight: ~113-116kg  Assessment: 77 yo male presented with abdominal pain and N/V. Found to have cholelithiasis with choledocholithiasis, transaminitis, and hyperbilirubinemia. Pharmacy has been consulted to manage TPN for bowel rest. Planning cholecystectomy this admission pending improvement in pancreatitis and choledocholithiasis. Patient has been NPO intermittently throughout hospital stay for procedures and is still experiencing N/V and abdominal pain.   Glucose / Insulin: no hx DM - CBGs 112-115. No insulin Electrolytes: last labs 7/18: K 4.3, Phos 2.9, Mg 2, CoCa 10 Renal: Scr 1.49 - appears to be near baseline Hepatic: Tbili 4.1; AST 73, ALT 139, alk phos wnl Intake / Output; MIVF: LR 100/hr; UOP 0.33mL/kg/hr; emesis x1 GI Imaging: 7/15 RUQ [place]: cholelithiasis w/o acute cholecystitis 7/15 CTAP: multiple low-density lesions in the pancreas. Mildly dilated ascending aorta 7/16 MRCP: mild intra and extrahepatic biliary ductal dilation. Sludge and gallstones in CBD. Acute cholecystitis. Multiple fluid signal cystic lesions in the pancreas. GI Surgeries / Procedures:  7/18 ERCP: pancreatic duct stent placement  Central access: 7/19 TPN start date: 7/19  Nutritional Goals: Goal TPN rate is 100 mL/hr (provides 139 g of protein and 2208 kcals per day)  RD Assessment:  Estimated Needs Total Energy Estimated Needs: 2200-2345 Total Protein Estimated Needs: 135-170 g Total Fluid Estimated Needs: > 2L  Current Nutrition:  NPO and TPN  Plan:  Start TPN at 57mL/hr at 1800 to meet ~50% of estimated needs Electrolytes in TPN: Na 29mEq/L, K 8mEq/L, Ca 70mEq/L, Mg 6mEq/L, and Phos 25mmol/L. Cl:Ac 1:1 Add standard MVI and trace elements to TPN  (Tbili elev but not jaundiced) Initiate Sensitive q4h SSI and adjust as needed  Reduce MIVF to 50 mL/hr at 1800 Monitor TPN labs on Mon/Thurs, daily until at goal rate IV thiamine today - will add to TPN starting 7/20 for 5 days total  [name], PharmD, BCPS 06/01/2023 1:39 PM

## 2023-06-01 NOTE — Progress Notes (Signed)
Rounding Note    Patient Name: Alexander Howe Date of Encounter: 06/01/2023  [facility] Cardiologist: None new  Subjective   Still having nausea and abdominal pain thru the night. Compazine did help a lot. No chest pain or dyspnea  Inpatient Medications    Scheduled Meds:  amLODipine  5 mg Oral Daily   diclofenac  100 mg Rectal Once   LORazepam  1 mg Oral Once   pantoprazole (PROTONIX) IV  40 mg Intravenous Q24H   PARoxetine  10 mg Oral Daily   prochlorperazine  10 mg Intravenous Q6H   sodium chloride flush  3 mL Intravenous Q12H   Continuous Infusions:  lactated ringers 100 mL/hr at 06/01/23 0445   piperacillin-tazobactam (ZOSYN)  IV 3.375 g (06/01/23 0917)   PRN Meds: acetaminophen, ALPRAZolam, fentaNYL (SUBLIMAZE) injection, hydrALAZINE, hydrALAZINE, morphine injection, ondansetron **OR** ondansetron (ZOFRAN) IV   Vital Signs    Vitals:   05/31/23 2237 06/01/23 0045 06/01/23 0513 06/01/23 0918  BP: (!) 160/62 (!) 146/74 (!) 165/95 (!) 181/84  Pulse: 71 69 72 81  Resp: 18 14 (!) 21 (!) 22  Temp:  98.1 F (36.7 C) 98.5 F (36.9 C) 97.6 F (36.4 C)  TempSrc:  Oral Oral Oral  SpO2: 97% 98% 99% 99%  Weight:   113.4 kg   Height:        Intake/Output Summary (Last 24 hours) at 06/01/2023 0935 Last data filed at 06/01/2023 0617 Gross per 24 hour  Intake 1221.95 ml  Output 650 ml  Net 571.95 ml      06/01/2023    5:13 AM 05/31/2023    5:05 AM 05/30/2023    1:21 PM  Last 3 Weights  Weight (lbs) 249 lb 14.4 oz 256 lb 6.3 oz 250 lb  Weight (kg) 113.354 kg 116.3 kg 113.399 kg      Telemetry    AFib with controlled rate, no bradycardia - Personally Reviewed  ECG    Afib, LVH, LAD - Personally Reviewed  Physical Exam   GEN: NAD Neck: No JVD Cardiac: IRRR, no murmurs, rubs, or gallops.  Respiratory: Clear to auscultation bilaterally. GI: Soft, nontender, non-distended  MS: No edema; No deformity. Neuro:  Nonfocal  Psych: Normal affect    Labs    High Sensitivity Troponin:  No results for input(s): "TROPONINIHS" in the last 720 hours.   Chemistry Recent Labs  Lab 05/30/23 0016 05/31/23 0806 05/31/23 1510  NA 141 138 136  K 4.3 3.4* 4.3  CL 104 102 102  CO2 29 27 26   GLUCOSE 93 115* 112*  BUN 28* 25* 24*  CREATININE 1.55* 1.59* 1.49*  CALCIUM 9.6 9.8 9.7  MG  --  2.0 2.0  PROT 6.1* 6.7 6.5  ALBUMIN 3.2* 3.6 3.6  AST 119* 86* 73*  ALT 194* 153* 139*  ALKPHOS 51 53 51  BILITOT 2.7* 4.1* 4.1*  GFRNONAA 46* 45* 48*  ANIONGAP 8 9 8     Lipids No results for input(s): "CHOL", "TRIG", "HDL", "LABVLDL", "LDLCALC", "CHOLHDL" in the last 168 hours.  Hematology Recent Labs  Lab 05/30/23 0016 05/30/23 2335 05/31/23 1510  WBC 8.5 9.1 14.8*  RBC 4.42 4.74 4.90  HGB 13.7 15.0 15.5  HCT 43.3 46.2 46.2  MCV 98.0 97.5 94.3  MCH 31.0 31.6 31.6  MCHC 31.6 32.5 33.5  RDW 13.9 13.8 13.7  PLT 129* 142* 147*   Thyroid  Recent Labs  Lab 05/30/23 0016  TSH 0.746    BNPNo results  for input(s): "BNP", "PROBNP" in the last 168 hours.  DDimer No results for input(s): "DDIMER" in the last 168 hours.   Radiology    DG Abd 1 View  Result Date: 05/31/2023 CLINICAL DATA:  History of pancreas stent EXAM: ABDOMEN - 1 VIEW COMPARISON:  05/30/2023, CT 05/27/2023 FINDINGS: Upper gas pattern is nonobstructed. Right upper quadrant pancreatic stent similar in position compared with fluoroscopic images. Multiple coarse pelvic calcifications corresponding to bladder stones. IMPRESSION: Right upper quadrant pancreatic stent similar in position compared with fluoroscopic images. Bladder stones Electronically Signed   By: Jasmine Pang M.D.   On: 05/31/2023 17:43   ECHOCARDIOGRAM COMPLETE  Result Date: 05/31/2023    ECHOCARDIOGRAM REPORT   Patient Name:   Alexander Howe Date of Exam: 05/31/2023 Medical Rec #:  [id number]      Height:       73.0 in Accession #:    [id number]     Weight:       256.4 lb Date of Birth:  03-Oct-1946     BSA:           2.391 m Patient Age:    77 years       BP:           162/95 mmHg Patient Gender: M              HR:           60 bpm. Exam Location:  Inpatient Procedure: 2D Echo, Cardiac Doppler and Color Doppler Indications:    Atrial Fibrillation  History:        Patient has prior history of Echocardiogram examinations, most                 recent 09/25/2022. CKD, AAA, hx cancer, Arrythmias:Atrial                 Fibrillation; Risk Factors:Hypertension, Sleep Apnea, Former                 Smoker, hx rheumatic fever and Dyslipidemia.  Sonographer:    Wallie Char Referring Phys: [id number] PRATIK D [facility]  Sonographer Comments: Image acquisition challenging due to patient body habitus. IMPRESSIONS  1. Left ventricular ejection fraction, by estimation, is 60 to 65%. The left ventricle has normal function. The left ventricle has no regional wall motion abnormalities. There is mild asymmetric left ventricular hypertrophy of the basal-septal segment. Left ventricular diastolic function could not be evaluated.  2. Right ventricular systolic function is normal. The right ventricular size is normal.  3. Left atrial size was severely dilated.  4. Right atrial size was mild to moderately dilated.  5. The mitral valve is normal in structure. No evidence of mitral valve regurgitation. No evidence of mitral stenosis.  6. The aortic valve is tricuspid. There is mild calcification of the aortic valve. Aortic valve regurgitation is trivial. Aortic valve sclerosis/calcification is present, without any evidence of aortic stenosis.  7. Aortic dilatation noted. There is borderline dilatation of the aortic root, measuring 39 mm. 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  8. The inferior vena cava is normal in size with greater than 50% respiratory variability, suggesting right atrial pressure of 3 mmHg. FINDINGS  Left Ventricle: Left ventricular ejection fraction, by estimation, is 60 to 65%. The left ventricle has  normal function. The left ventricle has no regional wall motion abnormalities. The left ventricular internal cavity size was normal in size. There is  mild asymmetric left  ventricular hypertrophy of the basal-septal segment. Left ventricular diastolic function could not be evaluated due to atrial fibrillation. Left ventricular diastolic function could not be evaluated. Right Ventricle: The right ventricular size is normal. No increase in right ventricular wall thickness. Right ventricular systolic function is normal. Left Atrium: Left atrial size was severely dilated. Right Atrium: Right atrial size was mild to moderately dilated. Pericardium: There is no evidence of pericardial effusion. Mitral Valve: The mitral valve is normal in structure. Mild mitral annular calcification. No evidence of mitral valve regurgitation. No evidence of mitral valve stenosis. MV peak gradient, 3.9 mmHg. The mean mitral valve gradient is 1.0 mmHg. Tricuspid Valve: The tricuspid valve is normal in structure. Tricuspid valve regurgitation is trivial. No evidence of tricuspid stenosis. Aortic Valve: The aortic valve is tricuspid. There is mild calcification of the aortic valve. Aortic valve regurgitation is trivial. Aortic valve sclerosis/calcification is present, without any evidence of aortic stenosis. Aortic valve mean gradient measures 3.7 mmHg. Aortic valve peak gradient measures 7.0 mmHg. Aortic valve area, by VTI measures 2.88 cm. Pulmonic Valve: The pulmonic valve was normal in structure. Pulmonic valve regurgitation is trivial. No evidence of pulmonic stenosis. Aorta: Aortic dilatation noted. There is borderline dilatation of the aortic root, measuring 39 mm. 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 Venous: The inferior vena cava is normal in size with greater than 50% respiratory variability, suggesting right atrial pressure of 3 mmHg. IAS/Shunts: No atrial level shunt detected by color flow Doppler.   LEFT VENTRICLE PLAX 2D LVIDd:         5.80 cm      Diastology LVIDs:         3.90 cm      LV e' medial:    9.58 cm/s LV PW:         1.00 cm      LV E/e' medial:  10.5 LV IVS:        1.40 cm      LV e' lateral:   13.73 cm/s LVOT diam:     2.20 cm      LV E/e' lateral: 7.3 LV SV:         77 LV SV Index:   32 LVOT Area:     3.80 cm  LV Volumes (MOD) LV vol d, MOD A2C: 95.8 ml LV vol d, MOD A4C: 147.0 ml LV vol s, MOD A2C: 38.5 ml LV vol s, MOD A4C: 56.7 ml LV SV MOD A2C:     57.3 ml LV SV MOD A4C:     147.0 ml LV SV MOD BP:      79.4 ml RIGHT VENTRICLE             IVC RV Basal diam:  4.10 cm     IVC diam: 1.40 cm RV S prime:     13.53 cm/s TAPSE (M-mode): 2.5 cm LEFT ATRIUM              Index        RIGHT ATRIUM           Index LA diam:        6.40 cm  2.68 cm/m   RA Area:     25.00 cm LA Vol (A2C):   155.0 ml 64.82 ml/m  RA Volume:   68.50 ml  28.65 ml/m LA Vol (A4C):   137.0 ml 57.29 ml/m LA Biplane Vol: 152.0 ml 63.57 ml/m  AORTIC VALVE AV Area (Vmax):  2.47 cm AV Area (Vmean):   2.74 cm AV Area (VTI):     2.88 cm AV Vmax:           132.00 cm/s AV Vmean:          83.567 cm/s AV VTI:            0.269 m AV Peak Grad:      7.0 mmHg AV Mean Grad:      3.7 mmHg LVOT Vmax:         85.77 cm/s LVOT Vmean:        60.167 cm/s LVOT VTI:          0.204 m LVOT/AV VTI ratio: 0.76  AORTA Ao Root diam: 3.90 cm Ao Asc diam:  4.60 cm MITRAL VALVE MV Area (PHT): 6.09 cm     SHUNTS MV Area VTI:   3.34 cm     Systemic VTI:  0.20 m MV Peak grad:  3.9 mmHg     Systemic Diam: 2.20 cm MV Mean grad:  1.0 mmHg MV Vmax:       0.98 m/s MV Vmean:      47.7 cm/s MV Decel Time: 125 msec MV E velocity: 100.40 cm/s Arvilla Meres MD Electronically signed by Arvilla Meres MD Signature Date/Time: 05/31/2023/10:10:24 AM    Final    DG ERCP  Result Date: 05/31/2023 CLINICAL DATA:  Cholelithiasis, choledocholithiasis EXAM: ERCP TECHNIQUE: Multiple spot images obtained with the fluoroscopic device and submitted for interpretation  post-procedure. FLUOROSCOPY: Radiation Exposure Index (as provided by the fluoroscopic device): 57.1 mGy Kerma COMPARISON:  MRCP 05/29/2023 FINDINGS: A total of 4 intraoperative saved images are submitted for review. The images demonstrate a flexible duodenal scope in the descending duodenum. The images document placement of a plastic stent in the pancreatic duct. IMPRESSION: ERCP with plastic pancreatic duct stent placement. These images were submitted for radiologic interpretation only. Please see the procedural report for the amount of contrast and the fluoroscopy time utilized. Electronically Signed   By: Malachy Moan M.D.   On: 05/31/2023 08:16   DG C-Arm 1-60 Min-No Report  Result Date: 05/30/2023 Fluoroscopy was utilized by the requesting physician.  No radiographic interpretation.    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 Left ventricular ejection fraction, by estimation, is 60 to 65%. The  left ventricle has normal function. The left ventricle has no regional  wall motion abnormalities. There is mild asymmetric left ventricular  hypertrophy of the basal-septal segment.  Left ventricular diastolic function could not be evaluated.   2. Right ventricular systolic function is normal. The right ventricular  size is normal.   3. Left atrial size was severely dilated.   4. Right atrial size was mild to moderately dilated.   5. The mitral valve is normal in structure. No  evidence of mitral valve  regurgitation. No evidence of mitral stenosis.   6. The aortic valve is tricuspid. There is mild calcification of the  aortic valve.  Aortic valve regurgitation is trivial. Aortic valve  sclerosis/calcification is present, without any evidence of aortic  stenosis.   7. Aortic dilatation noted. There is borderline dilatation of the aortic  root, measuring 39 mm. There is moderate dilatation of the ascending  aorta, measuring 46 mm.   8. The inferior vena cava is normal in size with greater than 50%  respiratory variability, suggesting right atrial pressure of 3 mmHg.    Patient Profile     77 y.o. male with acute cholelithiasis and ongoing biliary colic seen for evaluation of AFib  Assessment & Plan    Atrial fib. Duration unclear but apparently noted during ERCP.  I suspect Afib has been present for some time given severe LAE.  Last documented Ecg was 2021. His rate is controlled (has not received metoprolol since admission). He is completely asymptomatic. [place] Vasc score of 4. Echo shows normal LV function without significant valvular disease. At this point there is no reason that Afib should delay his cholecystectomy. Post op would recommend stopping ASA and starting on anticoagulation with Eliquis once safe to anticoagulate from a surgery standpoint.  I would not resume metoprolol post op since HR on low end of normal. He did have transient bradycardia yesterday due to increased vagal tone with Nausea and abdominal pain. No bradycardia noted overnight.  CAD. Extensive coronary calcification noted on CT. No anginal symptoms. No history of MI or stent. Again given lack of symptoms may proceed with surgery without further ischemic work up. Focus for now on risk factor modification. HTN. All BP meds held on admission. Would continue to hold ACEi and aldactone given CKD.  Now on amlodipine and hydralazine. Hold beta blocker as noted above AAA followed at [facility].  HLD  excellent control on atorvastatin at home. Would continue. LFT elevation is due to gallstones.          For questions or updates, please contact [facility] Please consult [email or web address] for contact info under        Signed, [employer]star Toft [place], MD  06/01/2023, 9:35 AM

## 2023-06-01 NOTE — Progress Notes (Signed)
Patient ID: Alexander Howe, male   DOB: 1946/03/06, 77 y.o.   MRN: [id number] [facility] Surgery Progress Note  2 Days Post-Op  Subjective: CC-  Daughter at bedside. States that his abdominal pain is the same as yesterday, no better and no worse. Ongoing n/v. Passing flatus, no BM. Labs pending.  Objective: Vital signs in last 24 hours: Temp:  [97.4 F (36.3 C)-98.5 F (36.9 C)] 97.6 F (36.4 C) (07/19 0918) Pulse Rate:  [56-81] 81 (07/19 0918) Resp:  [14-22] 22 (07/19 0918) BP: (146-184)/(62-95) 181/84 (07/19 0918) SpO2:  [97 %-100 %] 99 % (07/19 0918) Weight:  [113.4 kg] 113.4 kg (07/19 0513) Last BM Date : 05/27/23  Intake/Output from previous day: 07/18 0701 - 07/19 0700 In: 1222 [I.V.:1129; IV Piggyback:92.9] Out: 850 [Urine:850] Intake/Output this shift: No intake/output data recorded.  PE: Gen:  Alert, NAD, pleasant Card: A fib Pulm:  rate and effort normal on room air Abd: soft, mild distension, moderate tenderness across upper abdomen  Lab Results:  Recent Labs    05/30/23 2335 05/31/23 1510  WBC 9.1 14.8*  HGB 15.0 15.5  HCT 46.2 46.2  PLT 142* 147*   BMET Recent Labs    05/31/23 0806 05/31/23 1510  NA 138 136  K 3.4* 4.3  CL 102 102  CO2 27 26  GLUCOSE 115* 112*  BUN 25* 24*  CREATININE 1.59* 1.49*  CALCIUM 9.8 9.7   PT/INR No results for input(s): "LABPROT", "INR" in the last 72 hours. CMP     Component Value Date/Time   NA 136 05/31/2023 1510   NA 138 10/18/2012 0339   K 4.3 05/31/2023 1510   K 3.7 10/18/2012 0339   CL 102 05/31/2023 1510   CL 105 10/18/2012 0339   CO2 26 05/31/2023 1510   CO2 30 10/18/2012 0339   GLUCOSE 112 (H) 05/31/2023 1510   GLUCOSE 132 (H) 10/18/2012 0339   BUN 24 (H) 05/31/2023 1510   BUN 20 (H) 10/18/2012 0339   CREATININE 1.49 (H) 05/31/2023 1510   CREATININE 1.30 10/18/2012 0339   CALCIUM 9.7 05/31/2023 1510   CALCIUM 8.4 (L) 10/18/2012 0339   PROT 6.5 05/31/2023 1510   ALBUMIN 3.6  05/31/2023 1510   AST 73 (H) 05/31/2023 1510   ALT 139 (H) 05/31/2023 1510   ALKPHOS 51 05/31/2023 1510   BILITOT 4.1 (H) 05/31/2023 1510   GFRNONAA 48 (L) 05/31/2023 1510   GFRNONAA 57 (L) 10/18/2012 0339   GFRAA >60 12/29/2015 0544   GFRAA >60 10/18/2012 0339   Lipase     Component Value Date/Time   LIPASE 2,720 (H) 05/31/2023 1510       Studies/Results: [place] EKG SITE RITE  Result Date: 06/01/2023 If Site Rite image not attached, placement could not be confirmed due to current cardiac rhythm.  DG Abd 1 View  Result Date: 05/31/2023 CLINICAL DATA:  History of pancreas stent EXAM: ABDOMEN - 1 VIEW COMPARISON:  05/30/2023, CT 05/27/2023 FINDINGS: Upper gas pattern is nonobstructed. Right upper quadrant pancreatic stent similar in position compared with fluoroscopic images. Multiple coarse pelvic calcifications corresponding to bladder stones. IMPRESSION: Right upper quadrant pancreatic stent similar in position compared with fluoroscopic images. Bladder stones Electronically Signed   By: Jasmine Pang M.D.   On: 05/31/2023 17:43   ECHOCARDIOGRAM COMPLETE  Result Date: 05/31/2023    ECHOCARDIOGRAM REPORT   Patient Name:   Alexander Howe Date of Exam: 05/31/2023 Medical Rec #:  [id number]      Height:  73.0 in Accession #:    [id number]     Weight:       256.4 lb Date of Birth:  [birth year]     BSA:          2.391 m Patient Age:    76 years       BP:           162/95 mmHg Patient Gender: M              HR:           60 bpm. Exam Location:  Inpatient Procedure: 2D Echo, Cardiac Doppler and Color Doppler Indications:    Atrial Fibrillation  History:        Patient has prior history of Echocardiogram examinations, most                 recent 09/25/2022. CKD, AAA, hx cancer, Arrythmias:Atrial                 Fibrillation; Risk Factors:Hypertension, Sleep Apnea, Former                 Smoker, hx rheumatic fever and Dyslipidemia.  Sonographer:    Wallie Char Referring Phys: [id number] PRATIK D  [facility]  Sonographer Comments: Image acquisition challenging due to patient body habitus. IMPRESSIONS  1. Left ventricular ejection fraction, by estimation, is 60 to 65%. The left ventricle has normal function. The left ventricle has no regional wall motion abnormalities. There is mild asymmetric left ventricular hypertrophy of the basal-septal segment. Left ventricular diastolic function could not be evaluated.  2. Right ventricular systolic function is normal. The right ventricular size is normal.  3. Left atrial size was severely dilated.  4. Right atrial size was mild to moderately dilated.  5. The mitral valve is normal in structure. No evidence of mitral valve regurgitation. No evidence of mitral stenosis.  6. The aortic valve is tricuspid. There is mild calcification of the aortic valve. Aortic valve regurgitation is trivial. Aortic valve sclerosis/calcification is present, without any evidence of aortic stenosis.  7. Aortic dilatation noted. There is borderline dilatation of the aortic root, measuring 39 mm. 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  8. The inferior vena cava is normal in size with greater than 50% respiratory variability, suggesting right atrial pressure of 3 mmHg. FINDINGS  Left Ventricle: Left ventricular ejection fraction, by estimation, is 60 to 65%. The left ventricle has normal function. The left ventricle has no regional wall motion abnormalities. The left ventricular internal cavity size was normal in size. There is  mild asymmetric left ventricular hypertrophy of the basal-septal segment. Left ventricular diastolic function could not be evaluated due to atrial fibrillation. Left ventricular diastolic function could not be evaluated. Right Ventricle: The right ventricular size is normal. No increase in right ventricular wall thickness. Right ventricular systolic function is normal. Left Atrium: Left atrial size was severely dilated. Right Atrium: Right  atrial size was mild to moderately dilated. Pericardium: There is no evidence of pericardial effusion. Mitral Valve: The mitral valve is normal in structure. Mild mitral annular calcification. No evidence of mitral valve regurgitation. No evidence of mitral valve stenosis. MV peak gradient, 3.9 mmHg. The mean mitral valve gradient is 1.0 mmHg. Tricuspid Valve: The tricuspid valve is normal in structure. Tricuspid valve regurgitation is trivial. No evidence of tricuspid stenosis. Aortic Valve: The aortic valve is tricuspid. There is mild calcification of the aortic valve. Aortic valve regurgitation is trivial.  Aortic valve sclerosis/calcification is present, without any evidence of aortic stenosis. Aortic valve mean gradient measures 3.7 mmHg. Aortic valve peak gradient measures 7.0 mmHg. Aortic valve area, by VTI measures 2.88 cm. Pulmonic Valve: The pulmonic valve was normal in structure. Pulmonic valve regurgitation is trivial. No evidence of pulmonic stenosis. Aorta: Aortic dilatation noted. There is borderline dilatation of the aortic root, measuring 39 mm. 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 Venous: The inferior vena cava is normal in size with greater than 50% respiratory variability, suggesting right atrial pressure of 3 mmHg. IAS/Shunts: No atrial level shunt detected by color flow Doppler.  LEFT VENTRICLE PLAX 2D LVIDd:         5.80 cm      Diastology LVIDs:         3.90 cm      LV e' medial:    9.58 cm/s LV PW:         1.00 cm      LV E/e' medial:  10.5 LV IVS:        1.40 cm      LV e' lateral:   13.73 cm/s LVOT diam:     2.20 cm      LV E/e' lateral: 7.3 LV SV:         77 LV SV Index:   32 LVOT Area:     3.80 cm  LV Volumes (MOD) LV vol d, MOD A2C: 95.8 ml LV vol d, MOD A4C: 147.0 ml LV vol s, MOD A2C: 38.5 ml LV vol s, MOD A4C: 56.7 ml LV SV MOD A2C:     57.3 ml LV SV MOD A4C:     147.0 ml LV SV MOD BP:      79.4 ml RIGHT VENTRICLE             IVC RV Basal diam:  4.10  cm     IVC diam: 1.40 cm RV S prime:     13.53 cm/s TAPSE (M-mode): 2.5 cm LEFT ATRIUM              Index        RIGHT ATRIUM           Index LA diam:        6.40 cm  2.68 cm/m   RA Area:     25.00 cm LA Vol (A2C):   155.0 ml 64.82 ml/m  RA Volume:   68.50 ml  28.65 ml/m LA Vol (A4C):   137.0 ml 57.29 ml/m LA Biplane Vol: 152.0 ml 63.57 ml/m  AORTIC VALVE AV Area (Vmax):    2.47 cm AV Area (Vmean):   2.74 cm AV Area (VTI):     2.88 cm AV Vmax:           132.00 cm/s AV Vmean:          83.567 cm/s AV VTI:            0.269 m AV Peak Grad:      7.0 mmHg AV Mean Grad:      3.7 mmHg LVOT Vmax:         85.77 cm/s LVOT Vmean:        60.167 cm/s LVOT VTI:          0.204 m LVOT/AV VTI ratio: 0.76  AORTA Ao Root diam: 3.90 cm Ao Asc diam:  4.60 cm MITRAL VALVE MV Area (PHT): 6.09 cm     SHUNTS MV Area  VTI:   3.34 cm     Systemic VTI:  0.20 m MV Peak grad:  3.9 mmHg     Systemic Diam: 2.20 cm MV Mean grad:  1.0 mmHg MV Vmax:       0.98 m/s MV Vmean:      47.7 cm/s MV Decel Time: 125 msec MV E velocity: 100.40 cm/s Arvilla Meres MD Electronically signed by Arvilla Meres MD Signature Date/Time: 05/31/2023/10:10:24 AM    Final    DG ERCP  Result Date: 05/31/2023 CLINICAL DATA:  Cholelithiasis, choledocholithiasis EXAM: ERCP TECHNIQUE: Multiple spot images obtained with the fluoroscopic device and submitted for interpretation post-procedure. FLUOROSCOPY: Radiation Exposure Index (as provided by the fluoroscopic device): 57.1 mGy Kerma COMPARISON:  MRCP 05/29/2023 FINDINGS: A total of 4 intraoperative saved images are submitted for review. The images demonstrate a flexible duodenal scope in the descending duodenum. The images document placement of a plastic stent in the pancreatic duct. IMPRESSION: ERCP with plastic pancreatic duct stent placement. These images were submitted for radiologic interpretation only. Please see the procedural report for the amount of contrast and the fluoroscopy time utilized.  Electronically Signed   By: Malachy Moan M.D.   On: 05/31/2023 08:16   DG C-Arm 1-60 Min-No Report  Result Date: 05/30/2023 Fluoroscopy was utilized by the requesting physician.  No radiographic interpretation.    Anti-infectives: Anti-infectives (From admission, onward)    Start     Dose/Rate Route Frequency Ordered Stop   06/01/23 0000  piperacillin-tazobactam (ZOSYN) IVPB 3.375 g        3.375 g 12.5 mL/hr over 240 Minutes Intravenous Every 8 hours 05/31/23 1627     05/31/23 1645  piperacillin-tazobactam (ZOSYN) IVPB 3.375 g        3.375 g 100 mL/hr over 30 Minutes Intravenous  Once 05/31/23 1627 05/31/23 1827   05/30/23 1345  ampicillin-sulbactam (UNASYN) 1.5 g in sodium chloride 0.9 % 100 mL IVPB       Note to Pharmacy: Please send to ENDO tube station 25   1.5 g 200 mL/hr over 30 Minutes Intravenous  Once 05/30/23 1252 05/31/23 1826   05/30/23 1245  cefTRIAXone (ROCEPHIN) 2 g in sodium chloride 0.9 % 100 mL IVPB  Status:  Discontinued        2 g 200 mL/hr over 30 Minutes Intravenous On call to O.R. 05/30/23 0834 05/31/23 0559        Assessment/Plan Cholelithiasis with choledocholithiasis, transaminitis and hyperbilirubinemia  - MRCP shows cbd stones and bili has been elevated (labs pending today) - s/p ERCP 7/17, attempts at a cholangiogram failed, one plastic stent was placed into the ventral pancreatic duct. Post-ERCP pancreatitis - Labs are pending but patient with ongoing abdominal pain, n/v. Discussed case with Dr. Leone Payor. Will obtain CT scan today. Keep NPO for now. Start PICC/TPN.  Ultimately plan for lap chole during this hospitalization pending resolution of pancreatitis and choledocholithiasis.    FEN - NPO. IVF per TRH VTE - SCDs, subcutaneous heparin held ID - [email or web address]   I reviewed last 24 h vitals and pain scores and last 48 h intake and output .    LOS: 4 days    Franne Forts, [facility] Surgery 06/01/2023, 10:52 AM Please see Amion  for pager number during day hours 7:00am-4:30pm

## 2023-06-01 NOTE — Progress Notes (Signed)
PROGRESS NOTE  Alexander Howe:[id number] DOB: 1946-01-28 DOA: 05/27/2023 PCP: [name], MD  HPI/Recap of past 83 hours:  77 year old with history of depression, anxiety, AAA and descending aortic aneurysm, hypertension, hyperlipidemia, CKD stage IIIb, obstructive sleep apnea and history of nephrolithiasis presented with abdominal pain, nausea and vomiting for 1 day. Shortly started after eating. In the emergency room he was afebrile. WC count 12.5. Creatinine 2.03. Mildly elevated transaminases. Total bilirubin 5.7, direct bilirubin 1.8. CT scan notable for calculi of the urinary bladder without obstruction, multiple low-density pancreatic lesions, cholelithiasis without acute cholecystitis with normal CBD diameter. Admitted with IV fluids, pain medications and nausea medications.  MRCP performed 7/16 with mild intra and extrahepatic biliary ductal dilation noted and CBD stones.  Unable to retrieve CBD stone via ERCP.  Per general surgery CBG stone will have to be removed prior to lape chole.  Per GI may require needle -knife sphincterotomy.  Family wants transfer to [facility] if procedure cannot be done here.  06/01/2023: Seen and examined at bedside.  R flank edema noted on exam.  The patient is minimally interactive today.  Assessment/Plan: Principal Problem:   Elevated LFTs Active Problems:   Anxiety   Chronic kidney disease, stage 3 unspecified (HCC)   Benign essential hypertension   Depression   OSA on CPAP   Elevated bilirubin   Generalized abdominal pain   Cholelithiasis with choledocholithiasis   IPMN (intraductal papillary mucinous neoplasm)   Gastritis and gastroduodenitis   Atrial fibrillation (HCC)   Post-ERCP acute pancreatitis   Choledocholithiasis  Acute right upper quadrant abdominal pain Choledocholithiasis seen on MRCP Status post ERCP on 05/30/2023.  Failed attempt at a cholangiogram 1 plastic stent placed into the ventral pancreatic duct. Per GI, ok to do  lap chole and do IOC and if stones remove later.   Follow repeat CT abd pelvis 7/19 Per general surgery CBG stone will have to be removed prior to lape chole.  Per GI may require needle -knife sphincterotomy.  Family wants transfer to [facility] if procedure cannot be done here. Accepted at [facility] but no beds Dr. [name] from IM at [facility].  Can be transferred on Sunday or next week pending bed availability. As needed analgesics  Acute pancreatitis Lipase level [id number] IV fluid hydration Supportive care  Duodenitis Biopsied gastritis seen on EGD IV Protonix daily as recommended by GI Follow pathology results  Pancreatic tail cystic lesion Repeat MRI in 1 year as recommended by GI.  New onset atrial fibrillation with slow ventricular response Seen by cardiology Appreciate cardiologist's assistance Per cardiology, stop home Toprol-XL since heart rate on the lower side of normal.  Nausea and vomiting Supportive care IV antiemetics Gentle IV fluid hydration   Essential hypertension: BP is not at goal, elevated Hold off ACE inhibitor and Aldactone per cardiology due to CKD Home Norvasc Closely monitor vital signs. IV hydralazine PRN with parameters  Chronic anxiety Resume home Xanax and Paxil  Renal calculi/urinary bladder calculi: Patient has multiple urinary bladder calculi however without urinary symptoms.  No hydronephrosis or obstruction.  Monitor for symptoms.   Resolved acute kidney injury on CKD stage IIIb: Baseline about 1.7.  Maintenance fluid.  AKI appears to have resolved, follow labs in a.m.   Obstructive sleep apnea: To use CPAP at night.   Obesity BMI 33 Recommend weight loss outpatient with regular physical activity and healthy diet.  Hypokalemia Serum potassium 3.4 Repleted intravenously with LR KCl 40 mill equivalent at 50 cc/h  x 1 day Serum magnesium 2.0.  Chronic HFpEF Echocardiogram done on 05/31/2023 revealed LVEF 60-65% and severely dilated left  atrium. Start strict I's and O's and daily weight Closely monitor volume status while on IV fluid for acute pancreatitis. Closely monitor volume status while on IV fluid.   Time: 55 minutes.    Code Status: Full code  Family Communication: Wife and son at bedside.  Disposition Plan: Likely will discharge to home once general surgery, cardiology and GI sign off.   Consultants: Cardiology GI General surgery.  Procedures: ERCP  Antimicrobials: Zosyn  DVT prophylaxis: Chemical DVT prophylaxis held due to possible surgical procedure on 05/31/2023.  Status is: Inpatient The patient requires at least 2 midnights for further evaluation and treatment of present condition.    Objective: Vitals:   06/01/23 0045 06/01/23 0513 06/01/23 0918 06/01/23 1400  BP: (!) 146/74 (!) 165/95 (!) 181/84 (!) 174/90  Pulse: 69 72 81 74  Resp: 14 (!) 21 (!) 22 (!) 23  Temp: 98.1 F (36.7 C) 98.5 F (36.9 C) 97.6 F (36.4 C)   TempSrc: Oral Oral Oral   SpO2: 98% 99% 99% 96%  Weight:  113.4 kg    Height:        Intake/Output Summary (Last 24 hours) at 06/01/2023 1500 Last data filed at 06/01/2023 0617 Gross per 24 hour  Intake 1221.95 ml  Output 650 ml  Net 571.95 ml   Filed Weights   05/30/23 1321 05/31/23 0505 06/01/23 0513  Weight: 113.4 kg 116.3 kg 113.4 kg    Exam:  General: 77 y.o. year-old male WD WN in NAD  Alert and oriented x3. Cardiovascular: Irregular rate and rhythm with no rubs or gallops.  No thyromegaly or JVD noted.   Respiratory: Clear to auscultation with no wheezes or rales. Good inspiratory effort.  R flank edema Abdomen: Soft tender nondistended with normal bowel sounds x4 quadrants. Musculoskeletal: No lower extremity edema. 2/4 pulses in all 4 extremities. Skin: No ulcerative lesions noted or rashes, Psychiatry: Mood is appropriate for condittion   Data Reviewed: CBC: Recent Labs  Lab 05/28/23 0343 05/29/23 0324 05/30/23 0016 05/30/23 2335  05/31/23 1510  WBC 11.2* 9.5 8.5 9.1 14.8*  NEUTROABS  --   --   --   --  12.3*  HGB 16.6 14.1 13.7 15.0 15.5  HCT 50.7 43.1 43.3 46.2 46.2  MCV 98.1 94.9 98.0 97.5 94.3  PLT 149* 135* 129* 142* 147*   Basic Metabolic Panel: Recent Labs  Lab 05/28/23 0343 05/29/23 0324 05/30/23 0016 05/31/23 0806 05/31/23 1510 06/01/23 1255  NA 138 136 141 138 136  --   K 5.1 4.0 4.3 3.4* 4.3  --   CL 103 102 104 102 102  --   CO2 22 26 29 27 26   --   GLUCOSE 121* 101* 93 115* 112*  --   BUN 35* 32* 28* 25* 24*  --   CREATININE 1.91* 1.61* 1.55* 1.59* 1.49*  --   CALCIUM 9.8 9.4 9.6 9.8 9.7  --   MG  --   --   --  2.0 2.0 1.7  PHOS  --   --   --   --  2.9 3.0   GFR: Estimated Creatinine Clearance: 55.7 mL/min (A) (by C-G formula based on SCr of 1.49 mg/dL (H)). Liver Function Tests: Recent Labs  Lab 05/28/23 0343 05/29/23 0324 05/30/23 0016 05/31/23 0806 05/31/23 1510  AST 291* 182* 119* 86* 73*  ALT 282* 251* 194*  153* 139*  ALKPHOS 53 54 51 53 51  BILITOT 5.5* 3.3* 2.7* 4.1* 4.1*  PROT 6.8 6.0* 6.1* 6.7 6.5  ALBUMIN 3.5 3.3* 3.2* 3.6 3.6   Recent Labs  Lab 05/27/23 2247 05/31/23 0806 05/31/23 1510  LIPASE 40 5,934* 2,720*   No results for input(s): "AMMONIA" in the last 168 hours. Coagulation Profile: Recent Labs  Lab 05/28/23 0408  INR 1.1   Cardiac Enzymes: No results for input(s): "CKTOTAL", "CKMB", "CKMBINDEX", "TROPONINI" in the last 168 hours. BNP (last 3 results) No results for input(s): "PROBNP" in the last 8760 hours. HbA1C: No results for input(s): "HGBA1C" in the last 72 hours. CBG: No results for input(s): "GLUCAP" in the last 168 hours. Lipid Profile: No results for input(s): "CHOL", "HDL", "LDLCALC", "TRIG", "CHOLHDL", "LDLDIRECT" in the last 72 hours. Thyroid Function Tests: Recent Labs    05/30/23 0016  TSH 0.746   Anemia Panel: No results for input(s): "VITAMINB12", "FOLATE", "FERRITIN", "TIBC", "IRON", "RETICCTPCT" in the last 72  hours. Urine analysis:    Component Value Date/Time   COLORURINE YELLOW 05/27/2023 2044   APPEARANCEUR CLEAR 05/27/2023 2044   APPEARANCEUR Hazy 09/30/2012 0956   LABSPEC 1.012 05/27/2023 2044   LABSPEC 1.019 09/30/2012 0956   PHURINE 7.0 05/27/2023 2044   GLUCOSEU NEGATIVE 05/27/2023 2044   GLUCOSEU Negative 09/30/2012 0956   HGBUR NEGATIVE 05/27/2023 2044   BILIRUBINUR NEGATIVE 05/27/2023 2044   BILIRUBINUR 1+ 09/30/2012 0956   KETONESUR NEGATIVE 05/27/2023 2044   PROTEINUR 30 (A) 05/27/2023 2044   NITRITE NEGATIVE 05/27/2023 2044   LEUKOCYTESUR NEGATIVE 05/27/2023 2044   LEUKOCYTESUR Negative 09/30/2012 0956   Sepsis Labs: @LABRCNTIP (procalcitonin:4,lacticidven:4)  )No results found for this or any previous visit (from the past 240 hour(s)).    Studies: [place] EKG SITE RITE  Result Date: 06/01/2023 If Site Rite image not attached, placement could not be confirmed due to current cardiac rhythm.  DG Abd 1 View  Result Date: 05/31/2023 CLINICAL DATA:  History of pancreas stent EXAM: ABDOMEN - 1 VIEW COMPARISON:  05/30/2023, CT 05/27/2023 FINDINGS: Upper gas pattern is nonobstructed. Right upper quadrant pancreatic stent similar in position compared with fluoroscopic images. Multiple coarse pelvic calcifications corresponding to bladder stones. IMPRESSION: Right upper quadrant pancreatic stent similar in position compared with fluoroscopic images. Bladder stones Electronically Signed   By: Jasmine Pang M.D.   On: 05/31/2023 17:43    Scheduled Meds:  amLODipine  5 mg Oral Daily   diclofenac  100 mg Rectal Once   insulin aspart  0-9 Units Subcutaneous Q4H   LORazepam  1 mg Oral Once   pantoprazole (PROTONIX) IV  40 mg Intravenous Q24H   PARoxetine  10 mg Oral Daily   sodium chloride flush  3 mL Intravenous Q12H    Continuous Infusions:  lactated ringers 100 mL/hr at 06/01/23 1407   lactated ringers     piperacillin-tazobactam (ZOSYN)  IV 3.375 g (06/01/23 0917)   TPN ADULT  (ION)       LOS: 4 days     Darlin Drop, MD [facility] Pager [phone]  If 7PM-7AM, please contact night-coverage [email or web address] Password [email or web address] 06/01/2023, 3:00 PM

## 2023-06-01 NOTE — Progress Notes (Signed)
Peripherally Inserted Central Catheter Placement  The IV Nurse has discussed with the patient and/or persons authorized to consent for the patient, the purpose of this procedure and the potential benefits and risks involved with this procedure.  The benefits include less needle sticks, lab draws from the catheter, and the patient may be discharged home with the catheter. Risks include, but not limited to, infection, bleeding, blood clot (thrombus formation), and puncture of an artery; nerve damage and irregular heartbeat and possibility to perform a PICC exchange if needed/ordered by physician.  Alternatives to this procedure were also discussed.  Bard Power PICC patient education guide, fact sheet on infection prevention and patient information card has been provided to patient /or left at bedside.    PICC Placement Documentation  PICC Double Lumen 06/01/23 Right Basilic 46 cm 0 cm (Active)  Indication for Insertion or Continuance of Line Administration of hyperosmolar/irritating solutions (i.e. TPN, Vancomycin, etc.) 06/01/23 1545  Exposed Catheter (cm) 0 cm 06/01/23 1545  Site Assessment Clean, Dry, Intact 06/01/23 1545  Lumen #1 Status Flushed;Blood return noted;Saline locked 06/01/23 1545  Lumen #2 Status Flushed;Blood return noted;Saline locked 06/01/23 1545  Dressing Type Transparent 06/01/23 1545  Dressing Status Antimicrobial disc in place 06/01/23 1545  Safety Lock Not Applicable 06/01/23 1545  Line Care Connections checked and tightened 06/01/23 1545  Line Adjustment (NICU/IV Team Only) No 06/01/23 1545  Dressing Intervention New dressing 06/01/23 1545  Dressing Change Due 06/08/23 06/01/23 1545       Alexander Howe, Alexander Howe 06/01/2023, 3:57 PM

## 2023-06-02 DIAGNOSIS — K8062 Calculus of gallbladder and bile duct with acute cholecystitis without obstruction: Secondary | ICD-10-CM

## 2023-06-02 DIAGNOSIS — K8000 Calculus of gallbladder with acute cholecystitis without obstruction: Secondary | ICD-10-CM

## 2023-06-02 DIAGNOSIS — N183 Chronic kidney disease, stage 3 unspecified: Secondary | ICD-10-CM | POA: Diagnosis not present

## 2023-06-02 DIAGNOSIS — K2980 Duodenitis without bleeding: Secondary | ICD-10-CM

## 2023-06-02 DIAGNOSIS — K2970 Gastritis, unspecified, without bleeding: Secondary | ICD-10-CM

## 2023-06-02 DIAGNOSIS — R7989 Other specified abnormal findings of blood chemistry: Secondary | ICD-10-CM | POA: Diagnosis not present

## 2023-06-02 DIAGNOSIS — I10 Essential (primary) hypertension: Secondary | ICD-10-CM | POA: Diagnosis not present

## 2023-06-02 DIAGNOSIS — E43 Unspecified severe protein-calorie malnutrition: Secondary | ICD-10-CM | POA: Insufficient documentation

## 2023-06-02 DIAGNOSIS — F418 Other specified anxiety disorders: Secondary | ICD-10-CM | POA: Diagnosis not present

## 2023-06-02 LAB — COMPREHENSIVE METABOLIC PANEL
ALT: 82 U/L — ABNORMAL HIGH (ref 0–44)
AST: 44 U/L — ABNORMAL HIGH (ref 15–41)
Albumin: 2.9 g/dL — ABNORMAL LOW (ref 3.5–5.0)
Alkaline Phosphatase: 39 U/L (ref 38–126)
Anion gap: 9 (ref 5–15)
BUN: 17 mg/dL (ref 8–23)
CO2: 25 mmol/L (ref 22–32)
Calcium: 9.3 mg/dL (ref 8.9–10.3)
Chloride: 102 mmol/L (ref 98–111)
Creatinine, Ser: 1.32 mg/dL — ABNORMAL HIGH (ref 0.61–1.24)
GFR, Estimated: 56 mL/min — ABNORMAL LOW (ref >60)
Glucose, Bld: 159 mg/dL — ABNORMAL HIGH (ref 70–99)
Potassium: 3.7 mmol/L (ref 3.5–5.1)
Sodium: 136 mmol/L (ref 135–145)
Total Bilirubin: 7.8 mg/dL — ABNORMAL HIGH (ref 0.3–1.2)
Total Protein: 6 g/dL — ABNORMAL LOW (ref 6.5–8.1)

## 2023-06-02 LAB — CBC
HCT: 41.8 % (ref 39.0–52.0)
Hemoglobin: 13.8 g/dL (ref 13.0–17.0)
MCH: 31.2 pg (ref 26.0–34.0)
MCHC: 33 g/dL (ref 30.0–36.0)
MCV: 94.6 fL (ref 80.0–100.0)
Platelets: 108 10*3/uL — ABNORMAL LOW (ref 150–400)
RBC: 4.42 MIL/uL (ref 4.22–5.81)
RDW: 14.2 % (ref 11.5–15.5)
WBC: 21.8 10*3/uL — ABNORMAL HIGH (ref 4.0–10.5)
nRBC: 0 % (ref 0.0–0.2)

## 2023-06-02 LAB — MAGNESIUM: Magnesium: 1.7 mg/dL (ref 1.7–2.4)

## 2023-06-02 LAB — GLUCOSE, CAPILLARY
Glucose-Capillary: 104 mg/dL — ABNORMAL HIGH (ref 70–99)
Glucose-Capillary: 113 mg/dL — ABNORMAL HIGH (ref 70–99)
Glucose-Capillary: 118 mg/dL — ABNORMAL HIGH (ref 70–99)
Glucose-Capillary: 122 mg/dL — ABNORMAL HIGH (ref 70–99)
Glucose-Capillary: 132 mg/dL — ABNORMAL HIGH (ref 70–99)
Glucose-Capillary: 137 mg/dL — ABNORMAL HIGH (ref 70–99)

## 2023-06-02 LAB — PHOSPHORUS: Phosphorus: 2 mg/dL — ABNORMAL LOW (ref 2.5–4.6)

## 2023-06-02 LAB — TRIGLYCERIDES: Triglycerides: 66 mg/dL (ref <150)

## 2023-06-02 MED ORDER — LACTATED RINGERS IV SOLN: INTRAVENOUS | Status: AC

## 2023-06-02 MED ORDER — PROCHLORPERAZINE EDISYLATE 10 MG/2ML IJ SOLN
5.0000 mg | Freq: Four times a day (QID) | INTRAMUSCULAR | Status: DC | PRN
  Administered 2023-06-02 – 2023-06-03 (×4): 5 mg via INTRAVENOUS
  Filled 2023-06-02 (×4): qty 2

## 2023-06-02 MED ORDER — AMLODIPINE BESYLATE 10 MG PO TABS
10.0000 mg | ORAL_TABLET | Freq: Every day | ORAL | Status: DC
  Administered 2023-06-03: 10 mg via ORAL
  Filled 2023-06-02: qty 1

## 2023-06-02 MED ORDER — MORPHINE SULFATE (PF) 2 MG/ML IV SOLN
2.0000 mg | INTRAVENOUS | Status: AC | PRN
  Administered 2023-06-02 (×3): 2 mg via INTRAVENOUS
  Filled 2023-06-02 (×3): qty 1

## 2023-06-02 MED ORDER — POTASSIUM PHOSPHATES 15 MMOLE/5ML IV SOLN
15.0000 mmol | Freq: Once | INTRAVENOUS | Status: AC
  Administered 2023-06-02: 15 mmol via INTRAVENOUS
  Filled 2023-06-02: qty 5

## 2023-06-02 MED ORDER — LACTATED RINGERS IV SOLN: INTRAVENOUS | Status: DC

## 2023-06-02 MED ORDER — ZINC CHLORIDE 1 MG/ML IV SOLN
INTRAVENOUS | Status: DC
  Filled 2023-06-02: qty 1017

## 2023-06-02 MED ORDER — HEPARIN SODIUM (PORCINE) 5000 UNIT/ML IJ SOLN
5000.0000 [IU] | Freq: Three times a day (TID) | INTRAMUSCULAR | Status: DC
  Administered 2023-06-02 – 2023-06-03 (×4): 5000 [IU] via SUBCUTANEOUS
  Filled 2023-06-02 (×4): qty 1

## 2023-06-02 MED ORDER — MORPHINE SULFATE (PF) 2 MG/ML IV SOLN
INTRAVENOUS | Status: AC
  Administered 2023-06-02: 2 mg via INTRAVENOUS
  Filled 2023-06-02: qty 1

## 2023-06-02 MED ORDER — MAGNESIUM SULFATE 2 GM/50ML IV SOLN
2.0000 g | Freq: Once | INTRAVENOUS | Status: AC
  Administered 2023-06-02: 2 g via INTRAVENOUS
  Filled 2023-06-02: qty 50

## 2023-06-02 NOTE — Progress Notes (Signed)
Rounding Note    Patient Name: Alexander Howe Date of Encounter: 06/02/2023  [facility] Cardiologist: None   Subjective   Still having abdominal discomfort, nausea.  On TPN. The family reports that there is a tentative plan for transfer to [facility] for repeat attempt at ERCP.  Inpatient Medications    Scheduled Meds:  amLODipine  5 mg Oral Daily   Chlorhexidine Gluconate Cloth  6 each Topical Daily   diclofenac  100 mg Rectal Once   heparin injection (subcutaneous)  5,000 Units Subcutaneous Q8H   LORazepam  1 mg Oral Once   pantoprazole (PROTONIX) IV  40 mg Intravenous Q24H   PARoxetine  10 mg Oral Daily   sodium chloride flush  10-40 mL Intracatheter Q12H   sodium chloride flush  3 mL Intravenous Q12H   Continuous Infusions:  lactated ringers 75 mL/hr at 06/02/23 0654   lactated ringers     piperacillin-tazobactam (ZOSYN)  IV 3.375 g (06/02/23 0955)   potassium PHOSPHATE IVPB (in mmol) 15 mmol (06/02/23 0958)   TPN ADULT (ION) 50 mL/hr at 06/01/23 2009   TPN ADULT (ION)     PRN Meds: acetaminophen, ALPRAZolam, fentaNYL (SUBLIMAZE) injection, hydrALAZINE, hydrALAZINE, morphine injection, ondansetron **OR** ondansetron (ZOFRAN) IV, prochlorperazine, sodium chloride flush   Vital Signs    Vitals:   06/02/23 0019 06/02/23 0431 06/02/23 0626 06/02/23 0821  BP: (!) 168/96 (!) 164/90  (!) 179/95  Pulse: 90 86    Resp: (!) 24 (!) 22    Temp: 98.3 F (36.8 C) 98.3 F (36.8 C)  97.9 F (36.6 C)  TempSrc: Oral Oral  Oral  SpO2: 97% 93%    Weight:   113.6 kg   Height:        Intake/Output Summary (Last 24 hours) at 06/02/2023 1316 Last data filed at 06/02/2023 0700 Gross per 24 hour  Intake 1451.3 ml  Output 1330 ml  Net 121.3 ml      06/02/2023    6:26 AM 06/01/2023    5:13 AM 05/31/2023    5:05 AM  Last 3 Weights  Weight (lbs) 250 lb 8 oz 249 lb 14.4 oz 256 lb 6.3 oz  Weight (kg) 113.626 kg 113.354 kg 116.3 kg      Telemetry    Atrial  fibrillation with controlled ventricular response, occasional PVCs- Personally Reviewed  ECG    Atrial fibrillation- Personally Reviewed  Physical Exam  Appears mildly uncomfortable.  Mild jaundice GEN: No acute distress.   Neck: No JVD Cardiac: Irregular, no murmurs, rubs, or gallops.  Respiratory: Clear to auscultation bilaterally. GI: Soft, nontender, non-distended  MS: No edema; No deformity. Neuro:  Nonfocal  Psych: Normal affect   Labs    High Sensitivity Troponin:  No results for input(s): "TROPONINIHS" in the last 720 hours.   Chemistry Recent Labs  Lab 05/31/23 1510 06/01/23 0400 06/01/23 1255 06/02/23 0454  NA 136 140  --  136  K 4.3 4.2  --  3.7  CL 102 101  --  102  CO2 26 20*  --  25  GLUCOSE 112* 85  --  159*  BUN 24* 21  --  17  CREATININE 1.49* 1.51*  --  1.32*  CALCIUM 9.7 9.7  --  9.3  MG 2.0  --  1.7 1.7  PROT 6.5 6.3*  --  6.0*  ALBUMIN 3.6 3.2*  --  2.9*  AST 73* 57*  --  44*  ALT 139* 104*  --  82*  ALKPHOS 51 40  --  39  BILITOT 4.1* 6.1*  --  7.8*  GFRNONAA 48* 48*  --  56*  ANIONGAP 8 19*  --  9    Lipids  Recent Labs  Lab 06/02/23 0454  TRIG 66    Hematology Recent Labs  Lab 05/31/23 1510 06/01/23 2016 06/02/23 0454  WBC 14.8* 21.6* 21.8*  RBC 4.90 4.62 4.42  HGB 15.5 14.6 13.8  HCT 46.2 43.6 41.8  MCV 94.3 94.4 94.6  MCH 31.6 31.6 31.2  MCHC 33.5 33.5 33.0  RDW 13.7 14.3 14.2  PLT 147* 121* 108*   Thyroid  Recent Labs  Lab 05/30/23 0016  TSH 0.746    BNPNo results for input(s): "BNP", "PROBNP" in the last 168 hours.  DDimer No results for input(s): "DDIMER" in the last 168 hours.   Radiology    CT ABDOMEN PELVIS WO CONTRAST  Result Date: 06/01/2023 CLINICAL DATA:  Acute abdominal pain EXAM: CT ABDOMEN AND PELVIS WITHOUT CONTRAST TECHNIQUE: Multidetector CT imaging of the abdomen and pelvis was performed following the standard protocol without IV contrast. RADIATION DOSE REDUCTION: This exam was performed  according to the departmental dose-optimization program which includes automated exposure control, adjustment of the mA and/or kV according to patient size and/or use of iterative reconstruction technique. COMPARISON:  CT angiogram chest abdomen and pelvis 05/27/2023 FINDINGS: Lower chest: Scattered chronic appearing interstitial opacities are again seen in the lung bases. Hepatobiliary: Gallstones are present. There is trace inflammatory stranding surrounding the gallbladder neck. There is no biliary ductal dilatation. Cystic lesions in the liver appear unchanged. No new liver lesions are seen. Pancreas: Stent in the pancreatic head is unchanged. Low-density lesions in the pancreas seen on the prior study are not as well defined on this noncontrast exam. Low-density lesion in the pancreatic tail measures 19 mm, unchanged. There is new mild inflammatory stranding surrounding the pancreas. Spleen: Normal in size without focal abnormality. Adrenals/Urinary Tract: Bilateral adrenal nodularity is similar to the prior study. There is no hydronephrosis or renal calculus. There are 2 bladder calculi measuring up 2 9 mm which appear unchanged. Bilateral renal cysts are present. The largest cyst is in the inferior pole of the right kidney measuring 10 cm, unchanged. Stomach/Bowel: Stomach is within normal limits. No evidence of bowel wall thickening, distention, or inflammatory changes. Sigmoid colon anastomosis is present. Appendix is not visualized. Vascular/Lymphatic: Aortic atherosclerosis. No enlarged abdominal or pelvic lymph nodes. Reproductive: Prostate gland is mildly enlarged. Other: There is a small fat containing left inguinal hernia. There is no ascites. Musculoskeletal: Degenerative changes affect the spine and hips. IMPRESSION: 1. New mild inflammatory stranding surrounding the pancreas worrisome for acute pancreatitis. 2. Cholelithiasis. There is trace inflammatory stranding surrounding the gallbladder neck.  Correlate clinically for cholecystitis. 3. Stable pancreatic head stent. 4. Unchanged indeterminate low-density areas in the pancreas. 5. Bladder calculi are unchanged. 6. Stable hepatic and renal cysts. Aortic Atherosclerosis (ICD10-I70.0). Electronically Signed   By: Darliss Cheney M.D.   On: 06/01/2023 20:55   DG Chest Port 1 View  Result Date: 06/01/2023 CLINICAL DATA:  [id number] Status post peripherally inserted central catheter (PICC) central line placement [id number] EXAM: PORTABLE CHEST 1 VIEW COMPARISON:  Chest x-ray 02/08/2006, CT chest abdomen pelvis angio 05/27/2023 FINDINGS: Right PICC with tip terminating the expected region of the superior cavoatrial junction. Persistently prominent/mildly enlarged cardiac silhouette. The heart and mediastinal contours are unchanged. Aortic calcification Bilateral lower lobe atelectasis. No focal consolidation. No pulmonary edema.  No pleural effusion. No pneumothorax. No acute osseous abnormality. IMPRESSION: 1. Right PICC with tip terminating the expected region of the superior cavoatrial junction. 2. Low lung volumes with no active disease. 3.  Aortic Atherosclerosis (ICD10-I70.0). Electronically Signed   By: Tish Frederickson M.D.   On: 06/01/2023 17:33   [place] EKG SITE RITE  Result Date: 06/01/2023 If Site Rite image not attached, placement could not be confirmed due to current cardiac rhythm.  DG Abd 1 View  Result Date: 05/31/2023 CLINICAL DATA:  History of pancreas stent EXAM: ABDOMEN - 1 VIEW COMPARISON:  05/30/2023, CT 05/27/2023 FINDINGS: Upper gas pattern is nonobstructed. Right upper quadrant pancreatic stent similar in position compared with fluoroscopic images. Multiple coarse pelvic calcifications corresponding to bladder stones. IMPRESSION: Right upper quadrant pancreatic stent similar in position compared with fluoroscopic images. Bladder stones Electronically Signed   By: Jasmine Pang M.D.   On: 05/31/2023 17:43    Cardiac Studies   Ech 05/31/23:  IMPRESSIONS     1. Left ventricular ejection fraction, by estimation, is 60 to 65%. The  left ventricle has normal function. The left ventricle has no regional  wall motion abnormalities. There is mild asymmetric left ventricular  hypertrophy of the basal-septal segment.  Left ventricular diastolic function could not be evaluated.   2. Right ventricular systolic function is normal. The right ventricular  size is normal.   3. Left atrial size was severely dilated.   4. Right atrial size was mild to moderately dilated.   5. The mitral valve is normal in structure. No evidence of mitral valve  regurgitation. No evidence of mitral stenosis.   6. The aortic valve is tricuspid. There is mild calcification of the  aortic valve. Aortic valve regurgitation is trivial. Aortic valve  sclerosis/calcification is present, without any evidence of aortic  stenosis.   7. Aortic dilatation noted. There is borderline dilatation of the aortic  root, measuring 39 mm. There is moderate dilatation of the ascending  aorta, measuring 46 mm.   8. The inferior vena cava is normal in size with greater than 50%  respiratory variability, suggesting right atrial pressure of 3 mmHg.       Patient Profile     77 y.o. male with acute choledocholithiasis complicated by pancreatitis and newly diagnosed atrial fibrillation (that is felt to probably be chronic).  Incidentally noted extensive coronary calcifications on CT, without past clinical history of CAD.  Additional medical problems include hypertension, AAA, CKD 3B, hypercholesterolemia.  Assessment & Plan    Atrial fibrillation with spontaneous rate control, suggesting some degree of underlying AV nodal disease.  Asymptomatic.  He will require long-term anticoagulation with Eliquis once there are no planned invasive procedures.  Meantime can use short acting agent such as heparin.  Bilirubin continues to increase, although lipase was trending down, transaminases  are better and alkaline phosphatase is surprisingly normal.  BP relatively high, home ACE inhibitor's have been on hold due to changes in volume status.  Renal function stable.  Will increase the amlodipine to 10 mg daily.  Will continue to follow along, but no plan for major CV workup or change in treatment at this time.   For questions or updates, please contact [facility] Please consult [email or web address] for contact info under        Signed, Thurmon Fair, MD  06/02/2023, 1:16 PM

## 2023-06-02 NOTE — Progress Notes (Signed)
PHARMACY - TOTAL PARENTERAL NUTRITION CONSULT NOTE   Indication: Prolonged ileus  Patient Measurements: Height: 6\' 1"  (185.4 cm) Weight: 113.6 kg (250 lb 8 oz) IBW/kg (Calculated) : 79.9 TPN AdjBW (KG): 88.3 Body mass index is 33.05 kg/m. Usual Weight: ~113-116kg  Assessment: 77 yo male presented with abdominal pain and N/V. Found to have cholelithiasis with choledocholithiasis, transaminitis, and hyperbilirubinemia. Pharmacy has been consulted to manage TPN for bowel rest. Planning cholecystectomy this admission pending improvement in pancreatitis and choledocholithiasis. Patient has been NPO intermittently throughout hospital stay for procedures and is still experiencing N/V and abdominal pain.   Glucose / Insulin: no hx DM - CBGs down to 64 last night and D10 @ 54mL/hr started. Cbgs up to low 100-110s while on D10. sSSI ordered w/ TPN initiation and none used.  Electrolytes: K 3.7, Phos 2, Mg 1.7, CoCa 10 Renal: Scr 1.32 - appears to be near baseline, BUN 17 Hepatic: Tbili 7.8; LFTs improving, alk phos wnl, TG 66 Intake / Output; MIVF: LR 50/hr; UOP 0.25mL/kg/hr; emesis x1 GI Imaging: 7/15 RUQ [place]: cholelithiasis w/o acute cholecystitis 7/15 CTAP: multiple low-density lesions in the pancreas. Mildly dilated ascending aorta 7/16 MRCP: mild intra and extrahepatic biliary ductal dilation. Sludge and gallstones in CBD. Acute cholecystitis. Multiple fluid signal cystic lesions in the pancreas. GI Surgeries / Procedures:  7/18 ERCP: pancreatic duct stent placement  Central access: 7/19 TPN start date: 7/19  Nutritional Goals: Goal TPN rate is 100 mL/hr (provides 135 g of protein and 2344 kcals per day) *maximized dextrose content as much as possible to prevent hypoglycemia  RD Assessment:  Estimated Needs Total Energy Estimated Needs: 2200-2345 Total Protein Estimated Needs: 135-170 g Total Fluid Estimated Needs: > 2L  Current Nutrition:  NPO and TPN  Plan:  Increase TPN to  20mL/hr at 1800 to meet ~75% of estimated needs Electrolytes in TPN: Na 10mEq/L, K 65mEq/L, Ca 67mEq/L, Mg 49mEq/L, and incr Phos 81mmol/L. Cl:Ac 1:1 Add standard MVI  Remove trace elements Add back selenium, zinc, and chromium Stop SSI but continue CBG checks q4 hours Discussed with MD - stop D10, reduce MIVF to 20 mL/hr at 1800 Monitor TPN labs on Mon/Thurs, daily until at goal rate  Day 2/5 add thiamine to TPN Kphos IV x1 Mag 2g IV x1  [name], PharmD, BCPS 06/02/2023 8:35 AM

## 2023-06-02 NOTE — Progress Notes (Signed)
20:16 blood sugar of 64. Patient NPO, and TPN had been initiated on dayshift around 18:30. Paged physician to ask if treatment needed. New order for continuous IV dextrose given. Repeat blood sugar of 108 at 22:30.   Continues endorsing abdominal pain and nausea. Utilizing PRN morphine for pain (states the fentanyl makes him feel bad) and compazine and zofran given for nausea. Systolic blood pressure elevated to 160s and 170s despite PRN IV hydralizine given. Paged physician to notify.

## 2023-06-02 NOTE — Progress Notes (Signed)
PROGRESS NOTE    Alexander Howe  ZOX:[id number] DOB: Jan 02, 1946 DOA: 05/27/2023 PCP: [name], MD   Brief Narrative:  77 year old with history of depression, anxiety, AAA and descending aortic aneurysm, hypertension, hyperlipidemia, CKD stage IIIb, obstructive sleep apnea and history of nephrolithiasis presented with abdominal pain, nausea and vomiting for 1 day. Shortly started after eating. In the emergency room he was afebrile. WC count 12.5. Creatinine 2.03. Mildly elevated transaminases. Total bilirubin 5.7, direct bilirubin 1.8. CT scan notable for calculi of the urinary bladder without obstruction, multiple low-density pancreatic lesions, cholelithiasis without acute cholecystitis with normal CBD diameter. Admitted with IV fluids, pain medications and nausea medications.  MRCP performed 7/16 with mild intra and extrahepatic biliary ductal dilation noted and CBD stones.  Unable to retrieve CBD stone via ERCP.  Per general surgery CBG stone will have to be removed prior to lape chole.  Per GI may require needle -knife sphincterotomy.  Family wants transfer to [facility] if procedure cannot be done here.   Assessment & Plan:   Principal Problem:   Elevated LFTs Active Problems:   Anxiety   Chronic kidney disease, stage 3 unspecified (HCC)   Benign essential hypertension   Depression   OSA on CPAP   Elevated bilirubin   Generalized abdominal pain   Cholelithiasis with choledocholithiasis   IPMN (intraductal papillary mucinous neoplasm)   Gastritis and gastroduodenitis   Atrial fibrillation (HCC)   Post-ERCP acute pancreatitis   Choledocholithiasis   Acute pancreatitis without infection or necrosis   Protein-calorie malnutrition, severe   Acute intractable right upper quadrant abdominal pain Choledocholithiasis seen on MRCP Status post ERCP on 05/30/2023.  Failed attempt at a cholangiogram - 1 plastic stent placed into the ventral pancreatic duct.  General surgery indicates  gallstones will have to be removed prior to lape chole.  GI working with [facility] in hopes of transfer to their facility for specialized procedure with needle/knife sphincterotomy  Accepted at [facility] but no beds Dr. [name] from IM at [facility]  Can be transferred on Sunday or next week pending bed availability.   Acute gallstone pancreatitis, complicated by above Pancreatic tail cystic lesion -Continue aggressive fluid resuscitation -Pending transfer to [facility] as above -Repeat MRI in 1 year as recommended by GI to evaluate cystic lesion  Duodenitis Biopsied gastritis seen on EGD IV Protonix daily as recommended by GI Follow pathology results   New onset atrial fibrillation with slow ventricular response -Noted during procedure, questionably provoked, cardiology following -Will likely need Eliquis pending procedures as above -Hold home metoprolol   Nausea and vomiting Supportive care IV antiemetics Gentle IV fluid hydration    Essential hypertension: -Poorly controlled, increase amlodipine, likely exacerbated by uncontrolled pain as above   Chronic anxiety Resume home Xanax and Paxil   Renal calculi/urinary bladder calculi: Patient has multiple urinary bladder calculi however without urinary symptoms.  No hydronephrosis or obstruction.  Monitor for symptoms.   Resolved acute kidney injury on CKD stage IIIb:  Baseline about 1.7.  Maintenance fluid.  AKI appears to have resolved, follow labs in a.m.   Obstructive sleep apnea: To use CPAP at night.   Obesity BMI 33 Recommend weight loss outpatient with regular physical activity and healthy diet.   Hypokalemia Within normal limits after repletion, follow repeat labs, mag 1.7 potassium 3.7   Chronic HFpEF Echocardiogram done on 05/31/2023 revealed LVEF 60-65% and severely dilated left atrium. Start strict I's and O's and daily weight  DVT prophylaxis: heparin injection 5,000  Units Start: 06/02/23 0700 Code Status:   Code Status:  Full Code Family Communication: None present  Status is: Inpatient  Dispo: The patient is from: Home              Anticipated d/c is to: To be determined              Anticipated d/c date is: To be determined              Patient currently not medically stable for discharge  Consultants:  GI, cardiology, GEN surge  Procedures:  ERCP 7/17  Antimicrobials:  Zosyn  Subjective: No acute issues or events overnight, pain poorly controlled currently as morphine has expired but otherwise notes improvement in nausea vomiting and pain while morphine was available.  Objective: Vitals:   06/01/23 2210 06/02/23 0019 06/02/23 0431 06/02/23 0626  BP:  (!) 168/96 (!) 164/90   Pulse: 97 90 86   Resp: 20 (!) 24 (!) 22   Temp:  98.3 F (36.8 C) 98.3 F (36.8 C)   TempSrc:  Oral Oral   SpO2: 93% 97% 93%   Weight:    113.6 kg  Height:        Examination:  General:  Pleasantly resting in bed, No acute distress. HEENT:  Normocephalic atraumatic.  Sclerae nonicteric, noninjected.  Extraocular movements intact bilaterally. Neck:  Without mass or deformity.  Trachea is midline. Lungs:  Clear to auscultate bilaterally without rhonchi, wheeze, or rales. Heart:  Regular rate and rhythm.  Without murmurs, rubs, or gallops. Abdomen: Moderately distended, diffusely tender with no PMI Extremities: Without cyanosis, clubbing, edema, or obvious deformity. Skin:  Warm and dry, no erythema.   LOS: 5 days   Time spent:  [name], DO [facility]  If 7PM-7AM, please contact night-coverage [email or web address]  06/02/2023, 8:04 AM

## 2023-06-02 NOTE — Progress Notes (Signed)
Restarted chemical DVT prophylaxis SQ Heparin TID since no procedures are planned at this time.  CHADSVASC score 4, defer to cardiology to decide on anticoagulation since no procedures are planned at this time.  No charge note.

## 2023-06-02 NOTE — Progress Notes (Signed)
[place] [facility] ROUNDING NOTE   Subjective: Complains of severe abdominal discomfort, improved with morphine No vomiting Started on TPN   Objective: Vital signs in last 24 hours: Temp:  [97.9 F (36.6 C)-98.7 F (37.1 C)] 98 F (36.7 C) (07/20 1320) Pulse Rate:  [84-97] 84 (07/20 1320) Resp:  [16-24] 19 (07/20 1320) BP: (162-179)/(90-103) 162/101 (07/20 1320) SpO2:  [93 %-100 %] 93 % (07/20 1320) Weight:  [113.6 kg] 113.6 kg (07/20 0626) Last BM Date : 06/01/23 General: NAD Abdomen: Soft, mild distention, generalized tenderness, bowel sounds +     Intake/Output from previous day: 07/19 0701 - 07/20 0700 In: 1451.3 [I.V.:1384.5; IV Piggyback:66.8] Out: 1330 [Urine:1330] Intake/Output this shift: No intake/output data recorded.   Lab Results: Recent Labs    05/31/23 1510 06/01/23 2016 06/02/23 0454  WBC 14.8* 21.6* 21.8*  HGB 15.5 14.6 13.8  PLT 147* 121* 108*  MCV 94.3 94.4 94.6   BMET Recent Labs    05/31/23 1510 06/01/23 0400 06/02/23 0454  NA 136 140 136  K 4.3 4.2 3.7  CL 102 101 102  CO2 26 20* 25  GLUCOSE 112* 85 159*  BUN 24* 21 17  CREATININE 1.49* 1.51* 1.32*  CALCIUM 9.7 9.7 9.3   LFT Recent Labs    05/31/23 1510 06/01/23 0400 06/02/23 0454  PROT 6.5 6.3* 6.0*  ALBUMIN 3.6 3.2* 2.9*  AST 73* 57* 44*  ALT 139* 104* 82*  ALKPHOS 51 40 39  BILITOT 4.1* 6.1* 7.8*   PT/INR No results for input(s): "INR" in the last 72 hours.    Imaging/Other results: CT ABDOMEN PELVIS WO CONTRAST  Result Date: 06/01/2023 CLINICAL DATA:  Acute abdominal pain EXAM: CT ABDOMEN AND PELVIS WITHOUT CONTRAST TECHNIQUE: Multidetector CT imaging of the abdomen and pelvis was performed following the standard protocol without IV contrast. RADIATION DOSE REDUCTION: This exam was performed according to the departmental dose-optimization program which includes automated exposure control, adjustment of the mA and/or kV according to patient size and/or  use of iterative reconstruction technique. COMPARISON:  CT angiogram chest abdomen and pelvis 05/27/2023 FINDINGS: Lower chest: Scattered chronic appearing interstitial opacities are again seen in the lung bases. Hepatobiliary: Gallstones are present. There is trace inflammatory stranding surrounding the gallbladder neck. There is no biliary ductal dilatation. Cystic lesions in the liver appear unchanged. No new liver lesions are seen. Pancreas: Stent in the pancreatic head is unchanged. Low-density lesions in the pancreas seen on the prior study are not as well defined on this noncontrast exam. Low-density lesion in the pancreatic tail measures 19 mm, unchanged. There is new mild inflammatory stranding surrounding the pancreas. Spleen: Normal in size without focal abnormality. Adrenals/Urinary Tract: Bilateral adrenal nodularity is similar to the prior study. There is no hydronephrosis or renal calculus. There are 2 bladder calculi measuring up 2 9 mm which appear unchanged. Bilateral renal cysts are present. The largest cyst is in the inferior pole of the right kidney measuring 10 cm, unchanged. Stomach/Bowel: Stomach is within normal limits. No evidence of bowel wall thickening, distention, or inflammatory changes. Sigmoid colon anastomosis is present. Appendix is not visualized. Vascular/Lymphatic: Aortic atherosclerosis. No enlarged abdominal or pelvic lymph nodes. Reproductive: Prostate gland is mildly enlarged. Other: There is a small fat containing left inguinal hernia. There is no ascites. Musculoskeletal: Degenerative changes affect the spine and hips. IMPRESSION: 1. New mild inflammatory stranding surrounding the pancreas worrisome for acute pancreatitis. 2. Cholelithiasis. There is trace inflammatory stranding surrounding the gallbladder neck. Correlate clinically for  cholecystitis. 3. Stable pancreatic head stent. 4. Unchanged indeterminate low-density areas in the pancreas. 5. Bladder calculi are  unchanged. 6. Stable hepatic and renal cysts. Aortic Atherosclerosis (ICD10-I70.0). Electronically Signed   By: Darliss Cheney M.D.   On: 06/01/2023 20:55   DG Chest Port 1 View  Result Date: 06/01/2023 CLINICAL DATA:  [id number] Status post peripherally inserted central catheter (PICC) central line placement [id number] EXAM: PORTABLE CHEST 1 VIEW COMPARISON:  Chest x-ray 02/08/2006, CT chest abdomen pelvis angio 05/27/2023 FINDINGS: Right PICC with tip terminating the expected region of the superior cavoatrial junction. Persistently prominent/mildly enlarged cardiac silhouette. The heart and mediastinal contours are unchanged. Aortic calcification Bilateral lower lobe atelectasis. No focal consolidation. No pulmonary edema. No pleural effusion. No pneumothorax. No acute osseous abnormality. IMPRESSION: 1. Right PICC with tip terminating the expected region of the superior cavoatrial junction. 2. Low lung volumes with no active disease. 3.  Aortic Atherosclerosis (ICD10-I70.0). Electronically Signed   By: Tish Frederickson M.D.   On: 06/01/2023 17:33   [place] EKG SITE RITE  Result Date: 06/01/2023 If Site Rite image not attached, placement could not be confirmed due to current cardiac rhythm.  DG Abd 1 View  Result Date: 05/31/2023 CLINICAL DATA:  History of pancreas stent EXAM: ABDOMEN - 1 VIEW COMPARISON:  05/30/2023, CT 05/27/2023 FINDINGS: Upper gas pattern is nonobstructed. Right upper quadrant pancreatic stent similar in position compared with fluoroscopic images. Multiple coarse pelvic calcifications corresponding to bladder stones. IMPRESSION: Right upper quadrant pancreatic stent similar in position compared with fluoroscopic images. Bladder stones Electronically Signed   By: Jasmine Pang M.D.   On: 05/31/2023 17:43      Assessment &Plan  77 year old very pleasant gentleman with history of stage III CKD cholelithiasis with acute cholecystitis and choledocholithiasis s/p failed ERCP, unable to cannulate  bile duct with post ERCP pancreatitis Pancreatic duct stent in place CT abdomen pelvis consistent with mild pancreatitis and cholecystitis  T. bili is slightly higher likely secondary to acute pancreatitis causing compression of bile duct, transaminases and alk phos are trending down Lipase is trending down  Continue IV fluids, antibiotics and TPN Pain control and supportive care  Tentative plan to transfer patient to [facility] once bed is available  GI will continue to follow along   Iona Beard , MD [phone]  [facility]

## 2023-06-03 DIAGNOSIS — R7989 Other specified abnormal findings of blood chemistry: Secondary | ICD-10-CM | POA: Diagnosis not present

## 2023-06-03 DIAGNOSIS — K9189 Other postprocedural complications and disorders of digestive system: Secondary | ICD-10-CM

## 2023-06-03 DIAGNOSIS — K859 Acute pancreatitis without necrosis or infection, unspecified: Secondary | ICD-10-CM

## 2023-06-03 DIAGNOSIS — I10 Essential (primary) hypertension: Secondary | ICD-10-CM | POA: Diagnosis not present

## 2023-06-03 DIAGNOSIS — N183 Chronic kidney disease, stage 3 unspecified: Secondary | ICD-10-CM | POA: Diagnosis not present

## 2023-06-03 DIAGNOSIS — F418 Other specified anxiety disorders: Secondary | ICD-10-CM | POA: Diagnosis not present

## 2023-06-03 DIAGNOSIS — R17 Unspecified jaundice: Secondary | ICD-10-CM

## 2023-06-03 DIAGNOSIS — R1084 Generalized abdominal pain: Secondary | ICD-10-CM

## 2023-06-03 LAB — COMPREHENSIVE METABOLIC PANEL
ALT: 69 U/L — ABNORMAL HIGH (ref 0–44)
AST: 40 U/L (ref 15–41)
Albumin: 2.5 g/dL — ABNORMAL LOW (ref 3.5–5.0)
Alkaline Phosphatase: 44 U/L (ref 38–126)
Anion gap: 7 (ref 5–15)
BUN: 20 mg/dL (ref 8–23)
CO2: 25 mmol/L (ref 22–32)
Calcium: 9.1 mg/dL (ref 8.9–10.3)
Chloride: 105 mmol/L (ref 98–111)
Creatinine, Ser: 1.21 mg/dL (ref 0.61–1.24)
GFR, Estimated: >60 mL/min (ref >60)
Glucose, Bld: 138 mg/dL — ABNORMAL HIGH (ref 70–99)
Potassium: 3.7 mmol/L (ref 3.5–5.1)
Sodium: 137 mmol/L (ref 135–145)
Total Bilirubin: 9.1 mg/dL — ABNORMAL HIGH (ref 0.3–1.2)
Total Protein: 5.8 g/dL — ABNORMAL LOW (ref 6.5–8.1)

## 2023-06-03 LAB — CBC WITH DIFFERENTIAL/PLATELET
Abs Immature Granulocytes: 0.28 10*3/uL — ABNORMAL HIGH (ref 0.00–0.07)
Basophils Absolute: 0 10*3/uL (ref 0.0–0.1)
Basophils Relative: 0 %
Eosinophils Absolute: 0 10*3/uL (ref 0.0–0.5)
Eosinophils Relative: 0 %
HCT: 40.4 % (ref 39.0–52.0)
Hemoglobin: 13.5 g/dL (ref 13.0–17.0)
Immature Granulocytes: 2 %
Lymphocytes Relative: 4 %
Lymphs Abs: 0.7 10*3/uL (ref 0.7–4.0)
MCH: 32.1 pg (ref 26.0–34.0)
MCHC: 33.4 g/dL (ref 30.0–36.0)
MCV: 96 fL (ref 80.0–100.0)
Monocytes Absolute: 2.1 10*3/uL — ABNORMAL HIGH (ref 0.1–1.0)
Monocytes Relative: 11 %
Neutro Abs: 16.2 10*3/uL — ABNORMAL HIGH (ref 1.7–7.7)
Neutrophils Relative %: 83 %
Platelets: 117 10*3/uL — ABNORMAL LOW (ref 150–400)
RBC: 4.21 MIL/uL — ABNORMAL LOW (ref 4.22–5.81)
RDW: 14.6 % (ref 11.5–15.5)
WBC: 19.3 10*3/uL — ABNORMAL HIGH (ref 4.0–10.5)
nRBC: 0 % (ref 0.0–0.2)

## 2023-06-03 LAB — LIPASE, BLOOD: Lipase: 52 U/L — ABNORMAL HIGH (ref 11–51)

## 2023-06-03 LAB — MAGNESIUM: Magnesium: 2.1 mg/dL (ref 1.7–2.4)

## 2023-06-03 LAB — GLUCOSE, CAPILLARY
Glucose-Capillary: 127 mg/dL — ABNORMAL HIGH (ref 70–99)
Glucose-Capillary: 134 mg/dL — ABNORMAL HIGH (ref 70–99)
Glucose-Capillary: 139 mg/dL — ABNORMAL HIGH (ref 70–99)
Glucose-Capillary: 153 mg/dL — ABNORMAL HIGH (ref 70–99)

## 2023-06-03 LAB — PHOSPHORUS: Phosphorus: 2.3 mg/dL — ABNORMAL LOW (ref 2.5–4.6)

## 2023-06-03 MED ORDER — PANTOPRAZOLE SODIUM 40 MG IV SOLR: 40.0000 mg | INTRAVENOUS | Status: AC

## 2023-06-03 MED ORDER — LORAZEPAM 1 MG PO TABS: 1.0000 mg | ORAL_TABLET | Freq: Once | ORAL | Status: AC

## 2023-06-03 MED ORDER — HYDROMORPHONE HCL 1 MG/ML IJ SOLN: 0.5000 mg | INTRAMUSCULAR | Status: AC | PRN

## 2023-06-03 MED ORDER — CHLORHEXIDINE GLUCONATE CLOTH 2 % EX PADS: 6.0000 | MEDICATED_PAD | Freq: Every day | CUTANEOUS | Status: AC

## 2023-06-03 MED ORDER — PIPERACILLIN-TAZOBACTAM 3.375 G IVPB: 3.3750 g | Freq: Three times a day (TID) | INTRAVENOUS | Status: AC

## 2023-06-03 MED ORDER — DICLOFENAC SUPPOSITORY 100 MG: 100.0000 mg | Freq: Once | RECTAL | 0 refills | Status: AC

## 2023-06-03 MED ORDER — AMLODIPINE BESYLATE 10 MG PO TABS: 10.0000 mg | ORAL_TABLET | Freq: Every day | ORAL | Status: AC

## 2023-06-03 MED ORDER — MORPHINE SULFATE (PF) 2 MG/ML IV SOLN
2.0000 mg | INTRAVENOUS | Status: DC | PRN
  Administered 2023-06-03 (×2): 2 mg via INTRAVENOUS
  Filled 2023-06-03 (×2): qty 1

## 2023-06-03 MED ORDER — LACTATED RINGERS IV SOLN: 1000.0000 mL | INTRAVENOUS | Status: AC

## 2023-06-03 MED ORDER — ZINC CHLORIDE 1 MG/ML IV SOLN: INTRAVENOUS | Status: DC

## 2023-06-03 MED ORDER — HYDROMORPHONE HCL 1 MG/ML IJ SOLN
0.5000 mg | INTRAMUSCULAR | Status: DC | PRN
  Administered 2023-06-03 (×2): 1 mg via INTRAVENOUS
  Filled 2023-06-03 (×2): qty 1

## 2023-06-03 MED ORDER — INSULIN ASPART 100 UNIT/ML IJ SOLN: 0.0000 [IU] | INTRAMUSCULAR | Status: DC

## 2023-06-03 MED ORDER — PROCHLORPERAZINE EDISYLATE 10 MG/2ML IJ SOLN: 5.0000 mg | Freq: Four times a day (QID) | INTRAMUSCULAR | Status: AC | PRN

## 2023-06-03 MED ORDER — HYDRALAZINE HCL 20 MG/ML IJ SOLN: 2.0000 mg | Freq: Four times a day (QID) | INTRAMUSCULAR | Status: AC | PRN

## 2023-06-03 MED ORDER — ZINC CHLORIDE 1 MG/ML IV SOLN
INTRAVENOUS | Status: DC
  Filled 2023-06-03: qty 1356

## 2023-06-03 MED ORDER — POTASSIUM PHOSPHATES 15 MMOLE/5ML IV SOLN: 15.0000 mmol | Freq: Once | INTRAVENOUS | Status: AC

## 2023-06-03 MED ORDER — HEPARIN SODIUM (PORCINE) 5000 UNIT/ML IJ SOLN: 5000.0000 [IU] | Freq: Three times a day (TID) | INTRAMUSCULAR | Status: AC

## 2023-06-03 MED ORDER — HYDRALAZINE HCL 10 MG PO TABS: 10.0000 mg | ORAL_TABLET | Freq: Three times a day (TID) | ORAL | Status: AC | PRN

## 2023-06-03 MED ORDER — ACETAMINOPHEN 325 MG PO TABS: 325.0000 mg | ORAL_TABLET | Freq: Four times a day (QID) | ORAL | Status: AC | PRN

## 2023-06-03 MED ORDER — POTASSIUM PHOSPHATES 15 MMOLE/5ML IV SOLN
15.0000 mmol | Freq: Once | INTRAVENOUS | Status: DC
  Filled 2023-06-03: qty 5

## 2023-06-03 MED ORDER — INSULIN ASPART 100 UNIT/ML IJ SOLN: 0.0000 [IU] | INTRAMUSCULAR | Status: AC

## 2023-06-03 MED ORDER — ONDANSETRON HCL 4 MG PO TABS: 4.0000 mg | ORAL_TABLET | Freq: Four times a day (QID) | ORAL | Status: AC | PRN

## 2023-06-03 NOTE — Progress Notes (Signed)
Remains in rate controlled atrial flutter.  No cardiovascular complaints. Plan to transfer to [facility] this afternoon for ERCP.  No new recommendations.  Please reconsult if needed.

## 2023-06-03 NOTE — Progress Notes (Signed)
Patient transport to [facility] set up, spoke to [place] with [employer] at [phone].

## 2023-06-03 NOTE — Progress Notes (Signed)
[place] [facility] ROUNDING NOTE   Subjective: Abdominal pain better controlled with Dilaudid.  He is feeling slightly better.  No vomiting   Objective: Vital signs in last 24 hours: Temp:  [97.6 F (36.4 C)-98.7 F (37.1 C)] 97.6 F (36.4 C) (07/21 1123) Pulse Rate:  [79-92] 79 (07/21 0451) Resp:  [19-26] 23 (07/21 0451) BP: (158-180)/(87-101) 158/96 (07/21 0739) SpO2:  [93 %-99 %] 98 % (07/21 0451) Weight:  [114.8 kg] 114.8 kg (07/21 0451) Last BM Date : 06/01/23 General: NAD Abdomen: Soft, no tenderness, mild distention Ext: No edema    Intake/Output from previous day: 07/20 0701 - 07/21 0700 In: 2579.7 [I.V.:2406.6; IV Piggyback:173.1] Out: 600 [Urine:600] Intake/Output this shift: Total I/O In: -  Out: 200 [Urine:200]   Lab Results: Recent Labs    06/01/23 2016 06/02/23 0454 06/03/23 1115  WBC 21.6* 21.8* 19.3*  HGB 14.6 13.8 13.5  PLT 121* 108* 117*  MCV 94.4 94.6 96.0   BMET Recent Labs    06/01/23 0400 06/02/23 0454 06/03/23 1115  NA 140 136 137  K 4.2 3.7 3.7  CL 101 102 105  CO2 20* 25 25  GLUCOSE 85 159* 138*  BUN 21 17 20   CREATININE 1.51* 1.32* 1.21  CALCIUM 9.7 9.3 9.1   LFT Recent Labs    06/01/23 0400 06/02/23 0454 06/03/23 1115  PROT 6.3* 6.0* 5.8*  ALBUMIN 3.2* 2.9* 2.5*  AST 57* 44* 40  ALT 104* 82* 69*  ALKPHOS 40 39 44  BILITOT 6.1* 7.8* 9.1*   PT/INR No results for input(s): "INR" in the last 72 hours.    Imaging/Other results: CT ABDOMEN PELVIS WO CONTRAST  Result Date: 06/01/2023 CLINICAL DATA:  Acute abdominal pain EXAM: CT ABDOMEN AND PELVIS WITHOUT CONTRAST TECHNIQUE: Multidetector CT imaging of the abdomen and pelvis was performed following the standard protocol without IV contrast. RADIATION DOSE REDUCTION: This exam was performed according to the departmental dose-optimization program which includes automated exposure control, adjustment of the mA and/or kV according to patient size and/or use of  iterative reconstruction technique. COMPARISON:  CT angiogram chest abdomen and pelvis 05/27/2023 FINDINGS: Lower chest: Scattered chronic appearing interstitial opacities are again seen in the lung bases. Hepatobiliary: Gallstones are present. There is trace inflammatory stranding surrounding the gallbladder neck. There is no biliary ductal dilatation. Cystic lesions in the liver appear unchanged. No new liver lesions are seen. Pancreas: Stent in the pancreatic head is unchanged. Low-density lesions in the pancreas seen on the prior study are not as well defined on this noncontrast exam. Low-density lesion in the pancreatic tail measures 19 mm, unchanged. There is new mild inflammatory stranding surrounding the pancreas. Spleen: Normal in size without focal abnormality. Adrenals/Urinary Tract: Bilateral adrenal nodularity is similar to the prior study. There is no hydronephrosis or renal calculus. There are 2 bladder calculi measuring up 2 9 mm which appear unchanged. Bilateral renal cysts are present. The largest cyst is in the inferior pole of the right kidney measuring 10 cm, unchanged. Stomach/Bowel: Stomach is within normal limits. No evidence of bowel wall thickening, distention, or inflammatory changes. Sigmoid colon anastomosis is present. Appendix is not visualized. Vascular/Lymphatic: Aortic atherosclerosis. No enlarged abdominal or pelvic lymph nodes. Reproductive: Prostate gland is mildly enlarged. Other: There is a small fat containing left inguinal hernia. There is no ascites. Musculoskeletal: Degenerative changes affect the spine and hips. IMPRESSION: 1. New mild inflammatory stranding surrounding the pancreas worrisome for acute pancreatitis. 2. Cholelithiasis. There is trace inflammatory stranding surrounding the  gallbladder neck. Correlate clinically for cholecystitis. 3. Stable pancreatic head stent. 4. Unchanged indeterminate low-density areas in the pancreas. 5. Bladder calculi are unchanged.  6. Stable hepatic and renal cysts. Aortic Atherosclerosis (ICD10-I70.0). Electronically Signed   By: Darliss Cheney M.D.   On: 06/01/2023 20:55   DG Chest Port 1 View  Result Date: 06/01/2023 CLINICAL DATA:  [id number] Status post peripherally inserted central catheter (PICC) central line placement [id number] EXAM: PORTABLE CHEST 1 VIEW COMPARISON:  Chest x-ray 02/08/2006, CT chest abdomen pelvis angio 05/27/2023 FINDINGS: Right PICC with tip terminating the expected region of the superior cavoatrial junction. Persistently prominent/mildly enlarged cardiac silhouette. The heart and mediastinal contours are unchanged. Aortic calcification Bilateral lower lobe atelectasis. No focal consolidation. No pulmonary edema. No pleural effusion. No pneumothorax. No acute osseous abnormality. IMPRESSION: 1. Right PICC with tip terminating the expected region of the superior cavoatrial junction. 2. Low lung volumes with no active disease. 3.  Aortic Atherosclerosis (ICD10-I70.0). Electronically Signed   By: Tish Frederickson M.D.   On: 06/01/2023 17:33      Assessment &Plan  77 year old very pleasant gentleman with history of stage III CKD cholelithiasis with acute cholecystitis and choledocholithiasis s/p failed ERCP, unable to cannulate bile duct with post ERCP pancreatitis  Pancreatic duct stent in place, will need follow-up based on hospital course  CT abdomen pelvis consistent with mild pancreatitis and cholecystitis   T. 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 Scherry Ran , MD [phone]  [facility]astro[facility]

## 2023-06-03 NOTE — Progress Notes (Signed)
PHARMACY - TOTAL PARENTERAL NUTRITION CONSULT NOTE   Indication: Prolonged ileus  Patient Measurements: Height: 6\' 1"  (185.4 cm) Weight: 114.8 kg (253 lb) IBW/kg (Calculated) : 79.9 TPN AdjBW (KG): 88.3 Body mass index is 33.38 kg/m. Usual Weight: ~113-116kg  Assessment: 77 yo male presented with abdominal pain and N/V. Found to have cholelithiasis with choledocholithiasis, transaminitis, and hyperbilirubinemia. Pharmacy has been consulted to manage TPN for bowel rest. Planning cholecystectomy this admission pending improvement in pancreatitis and choledocholithiasis. Patient has been NPO intermittently throughout hospital stay for procedures and is still experiencing N/V and abdominal pain.   Glucose / Insulin: no hx DM -  Cbgs up to low 100-110s while on D10. Resume sSSI this evening. - Evening of 7/19 CBGs down to 64 and D10 @ 56mL/hr started, back up to 10 Electrolytes: K 3.7, Phos 2.3, Mg 2.1, CoCa 10 Renal: Scr 1.21 - appears to be near baseline, BUN 20 Hepatic: Tbili 9.1; LFTs improving, alk phos wnl, TG 66 Intake / Output; MIVF: LR 20/hr; UOP 0.39mL/kg/hr GI Imaging: 7/15 RUQ [place]: cholelithiasis w/o acute cholecystitis 7/15 CTAP: multiple low-density lesions in the pancreas. Mildly dilated ascending aorta 7/16 MRCP: mild intra and extrahepatic biliary ductal dilation. Sludge and gallstones in CBD. Acute cholecystitis. Multiple fluid signal cystic lesions in the pancreas. GI Surgeries / Procedures:  7/18 ERCP: pancreatic duct stent placement  Central access: 7/19 TPN start date: 7/19  Nutritional Goals: Goal TPN rate is 100 mL/hr (provides 135 g of protein and 2344 kcals per day) *maximized dextrose content as much as possible to prevent hypoglycemia  RD Assessment:  Estimated Needs Total Energy Estimated Needs: 2200-2345 Total Protein Estimated Needs: 135-170 g Total Fluid Estimated Needs: > 2L  Current Nutrition:  NPO and TPN  Plan:  Increase TPN to 157mL/hr at  1800 to meet ~100% of estimated needs Electrolytes in TPN: Na 57mEq/L, K 60mEq/L, Ca 27mEq/L, Mg 63mEq/L, and Phos 30mmol/L. Cl:Ac 1:1 Add standard MVI  Remove trace elements Add back selenium, zinc, and chromium Resume sSSI and CBG checks q4 hours D/c MIVF  Monitor TPN labs on Mon/Thurs, daily until at goal rate  Day 3/5 add thiamine to TPN Kphos IV x1  Planning transfer to [facility] for ERCP today. Unable to confirm at this time if patient will stay admitted at [facility] or be transferred back to [facility] after procedure. [facility] is holding bed just in case. Since dispo plans pending at this time, will order TPN bag for this evening in the event patient returns to [facility].  [name], PharmD, BCPS 06/03/2023 12:11 PM

## 2023-06-03 NOTE — Progress Notes (Signed)
S/W Domingo Cocking, RN, transfer coordinator from [facility].  Per [facility], patient has an assigned bed at [facility], [address] [place], [place].  Bed 2310.  This RN will need to call report when ready to [phone].  [facility] can transport by ground vehicle or air; call [phone] to schedule transport.  Consulting civil engineer notified with preceptor, Eduardo Osier, RN.  Will make patient/family aware and prepare for safe patient hand-off to appropriate transporter(s).  -- Caleen Essex, RN, BSN

## 2023-06-03 NOTE — Progress Notes (Signed)
PROGRESS NOTE    Alexander Howe  WUJ:[id number] DOB: 10-Jul-1946 DOA: 05/27/2023 PCP: [name], MD   Brief Narrative:  77 year old with history of depression, anxiety, AAA and descending aortic aneurysm, hypertension, hyperlipidemia, CKD stage IIIb, obstructive sleep apnea and history of nephrolithiasis presented with abdominal pain, nausea and vomiting for 1 day. Shortly started after eating. In the emergency room he was afebrile. WC count 12.5. Creatinine 2.03. Mildly elevated transaminases. Total bilirubin 5.7, direct bilirubin 1.8. CT scan notable for calculi of the urinary bladder without obstruction, multiple low-density pancreatic lesions, cholelithiasis without acute cholecystitis with normal CBD diameter. Admitted with IV fluids, pain medications and nausea medications.  MRCP performed 7/16 with mild intra and extrahepatic biliary ductal dilation noted and CBD stones.  Unable to retrieve CBD stone via ERCP.  Per general surgery CBG stone will have to be removed prior to lape chole.  Per GI may require needle -knife sphincterotomy.  Family wants transfer to [facility] if procedure cannot be done here.  Patient currently being transferred to [facility] for procedure as outlined below per discussion with GI and general surgery.  He is stable and otherwise agreeable for transfer.  We will accept patient back in transfer if [facility] is unable to keep patient on the campus given his need for transfer is in regards to a procedure that does not need prolonged monitoring.   Assessment & Plan:   Principal Problem:   Elevated LFTs Active Problems:   Anxiety   Chronic kidney disease, stage 3 unspecified (HCC)   Benign essential hypertension   Depression   OSA on CPAP   Elevated bilirubin   Generalized abdominal pain   Cholelithiasis with choledocholithiasis   IPMN (intraductal papillary mucinous neoplasm)   Gastritis and gastroduodenitis   Atrial fibrillation (HCC)   Post-ERCP acute pancreatitis    Choledocholithiasis   Acute pancreatitis without infection or necrosis   Protein-calorie malnutrition, severe   Calculus of GB and bile duct w acute cholecyst w/o obst   Acute intractable right upper quadrant abdominal pain Choledocholithiasis seen on MRCP Status post ERCP on 05/30/2023.  Failed attempt at a cholangiogram - 1 plastic stent placed into the ventral pancreatic duct.  General surgery indicates gallstones will have to be removed prior to lape chole.  GI working with [facility] in hopes of transfer to their facility for specialized procedure with needle/knife sphincterotomy  Accepted at [facility] but no beds Dr. [name] from IM at [facility].  Patient to be transferred today, can transfer back if necessary. -Pain poorly controlled overnight, transition to Dilaudid 0.5 to 1 mg with adequate control   Acute gallstone pancreatitis, complicated by above Pancreatic tail cystic lesion -Continue aggressive fluid resuscitation -Transfer to [facility] as above -Repeat MRI in 1 year as recommended by GI to evaluate cystic lesion  Duodenitis Biopsied gastritis seen on EGD IV Protonix daily as recommended by GI Follow pathology results   New onset atrial fibrillation with slow ventricular response -Noted during procedure, questionably provoked, cardiology following -Will likely need Eliquis pending procedures as above -Hold home metoprolol   Nausea and vomiting Supportive care IV antiemetics Gentle IV fluid hydration    Essential hypertension: -Poorly controlled, increase amlodipine, likely exacerbated by uncontrolled pain as above   Chronic anxiety Resume home Xanax and Paxil   Renal calculi/urinary bladder calculi: Patient has multiple urinary bladder calculi however without urinary symptoms.  No hydronephrosis or obstruction.  Monitor for symptoms.   Resolved acute kidney injury on CKD stage IIIb:  Baseline about 1.7.  Maintenance fluid.  AKI appears to have resolved, follow labs in a.m.    Obstructive sleep apnea: To use CPAP at night.   Obesity BMI 33 Recommend weight loss outpatient with regular physical activity and healthy diet.   Hypokalemia Within normal limits after repletion, follow repeat labs, mag 1.7 potassium 3.7   Chronic HFpEF Echocardiogram done on 05/31/2023 revealed LVEF 60-65% and severely dilated left atrium. Start strict I's and O's and daily weight  DVT prophylaxis: heparin injection 5,000 Units Start: 06/02/23 0700 Code Status:   Code Status: Full Code Family Communication: None present  Status is: Inpatient  Dispo: The patient is from: Home              Anticipated d/c is to: To be determined              Anticipated d/c date is: To be determined              Patient currently not medically stable for discharge  Consultants:  GI, cardiology, GEN surge  Procedures:  ERCP 7/17  Antimicrobials:  Zosyn  Subjective: No acute issues or events overnight, pain poorly controlled currently as morphine has expired but otherwise notes improvement in nausea vomiting and pain while morphine was available.  Objective: Vitals:   06/03/23 0020 06/03/23 0036 06/03/23 0451 06/03/23 0739  BP: (!) 175/96 (!) 165/88 (!) 177/87 (!) 158/96  Pulse: 92 90 79   Resp: 20 (!) 25 (!) 23   Temp: 97.7 F (36.5 C)  98.7 F (37.1 C) 98.2 F (36.8 C)  TempSrc: Oral  Oral Oral  SpO2: 96% 99% 98%   Weight:   114.8 kg   Height:        Examination:  General:  Pleasantly resting in bed, No acute distress. HEENT:  Normocephalic atraumatic.  Sclerae nonicteric, noninjected.  Extraocular movements intact bilaterally. Neck:  Without mass or deformity.  Trachea is midline. Lungs:  Clear to auscultate bilaterally without rhonchi, wheeze, or rales. Heart:  Regular rate and rhythm.  Without murmurs, rubs, or gallops. Abdomen: Moderately distended, diffusely tender with no PMI Extremities: Without cyanosis, clubbing, edema, or obvious deformity. Skin:  Warm and dry,  no erythema.   LOS: 6 days   Time spent:  [name], DO [facility]  If 7PM-7AM, please contact night-coverage [email or web address]  06/03/2023, 8:13 AM

## 2023-06-03 NOTE — Progress Notes (Signed)
S/W Sheryn Bison, transfer coordinator for [facility] regarding plan for patient to go for ERCP.  Discussed recent vital signs, lab values, patient condition, medications and pain management.  Onalee Hua stated patient is appropriate for transfer when a bed becomes available; likely this afternoon.  Receiving facility will call this RN back when a bed is ready.  Updated patient and family at bedside regarding plan.  Caleen Essex, RN, BSN

## 2023-06-03 NOTE — Plan of Care (Signed)
Problem: Education: Goal: Knowledge of General Education information will improve Description Including pain rating scale, medication(s)/side effects and non-pharmacologic comfort measures Outcome: Progressing   Problem: Health Behavior/Discharge Planning: Goal: Ability to manage health-related needs will improve Outcome: Progressing   Problem: Clinical Measurements: Goal: Respiratory complications will improve Outcome: Progressing   Problem: Activity: Goal: Risk for activity intolerance will decrease Outcome: Progressing   Problem: Nutrition: Goal: Adequate nutrition will be maintained Outcome: Progressing   Problem: Coping: Goal: Level of anxiety will decrease Outcome: Progressing   Problem: Pain Managment: Goal: General experience of comfort will improve Outcome: Progressing   Problem: Safety: Goal: Ability to remain free from injury will improve Outcome: Progressing   

## 2023-06-03 NOTE — Progress Notes (Signed)
Report given to York Ram, RN at [facility], [address].  This RN will arrange transportation with preceptor.  Receiving RN requested update by phone when patient is leaving our facility.  -- Caleen Essex, RN, BSN

## 2023-06-04 NOTE — Discharge Summary (Signed)
Discharge NOTE    Alexander Howe  [name]:[id number] DOB: 07/14/1946 DOA: 05/27/2023 PCP: [name], MD   Brief Narrative:  77 year old with history of depression, anxiety, AAA and descending aortic aneurysm, hypertension, hyperlipidemia, CKD stage IIIb, obstructive sleep apnea and history of nephrolithiasis presented with abdominal pain, nausea and vomiting for 1 day. Shortly started after eating. In the emergency room he was afebrile. WC count 12.5. Creatinine 2.03. Mildly elevated transaminases. Total bilirubin 5.7, direct bilirubin 1.8. CT scan notable for calculi of the urinary bladder without obstruction, multiple low-density pancreatic lesions, cholelithiasis without acute cholecystitis with normal CBD diameter. Admitted with IV fluids, pain medications and nausea medications.  MRCP performed 7/16 with mild intra and extrahepatic biliary ductal dilation noted and CBD stones.  Unable to retrieve CBD stone via ERCP.  Per general surgery CBG stone will have to be removed prior to lape chole.  Per GI may require needle -knife sphincterotomy.  Family wants transfer to [facility] if procedure cannot be done here.  Patient currently being transferred to [facility] for procedure as outlined below per discussion with GI and general surgery.  He is stable and otherwise agreeable for transfer.  We will accept patient back in transfer if [facility] is unable to keep patient on the campus given his need for transfer is in regards to a procedure that does not need prolonged monitoring.   Assessment & Plan:   Principal Problem:   Elevated LFTs Active Problems:   Anxiety   Chronic kidney disease, stage 3 unspecified (HCC)   Benign essential hypertension   Depression   OSA on CPAP   Elevated bilirubin   Generalized abdominal pain   Cholelithiasis with choledocholithiasis   IPMN (intraductal papillary mucinous neoplasm)   Gastritis and gastroduodenitis   Atrial fibrillation (HCC)   Post-ERCP acute pancreatitis    Choledocholithiasis   Acute pancreatitis without infection or necrosis   Protein-calorie malnutrition, severe   Calculus of GB and bile duct w acute cholecyst w/o obst   Acute intractable right upper quadrant abdominal pain Choledocholithiasis seen on MRCP Status post ERCP on 05/30/2023.  Failed attempt at a cholangiogram - 1 plastic stent placed into the ventral pancreatic duct.  General surgery indicates gallstones will have to be removed prior to lape chole.  GI working with [facility] in hopes of transfer to their facility for specialized procedure with needle/knife sphincterotomy  Accepted at [facility] but no beds Dr. [name] from IM at [facility].  Patient to be transferred today, can transfer back if necessary. -Pain poorly controlled overnight, transition to Dilaudid 0.5 to 1 mg with adequate control   Acute gallstone pancreatitis, complicated by above Pancreatic tail cystic lesion -Continue aggressive fluid resuscitation -Transfer to [facility] as above -Repeat MRI in 1 year as recommended by GI to evaluate cystic lesion  Duodenitis Biopsied gastritis seen on EGD IV Protonix daily as recommended by GI Follow pathology results   New onset atrial fibrillation with slow ventricular response -Noted during procedure, questionably provoked, cardiology following -Will likely need Eliquis pending procedures as above -Hold home metoprolol   Nausea and vomiting Supportive care IV antiemetics Gentle IV fluid hydration    Essential hypertension: -Poorly controlled, increase amlodipine, likely exacerbated by uncontrolled pain as above   Chronic anxiety Resume home Xanax and Paxil   Renal calculi/urinary bladder calculi: Patient has multiple urinary bladder calculi however without urinary symptoms.  No hydronephrosis or obstruction.  Monitor for symptoms.   Resolved acute kidney injury on CKD stage IIIb:  Baseline about 1.7.  Maintenance fluid.  AKI appears to have resolved, follow labs in a.m.    Obstructive sleep apnea: To use CPAP at night.   Obesity BMI 33 Recommend weight loss outpatient with regular physical activity and healthy diet.   Hypokalemia Within normal limits after repletion, follow repeat labs, mag 1.7 potassium 3.7   Chronic HFpEF Echocardiogram done on 05/31/2023 revealed LVEF 60-65% and severely dilated left atrium. Start strict I's and O's and daily weight  DVT prophylaxis:  Code Status:   Code Status: Prior Family Communication: None present  Status is: Inpatient  Dispo: The patient is from: Home              Anticipated d/c is to: To be determined              Anticipated d/c date is: To be determined              Patient currently not medically stable for discharge  Consultants:  GI, cardiology, GEN surge  Procedures:  ERCP 7/17  Antimicrobials:  Zosyn  Subjective: No acute issues or events overnight, pain poorly controlled currently as morphine has expired but otherwise notes improvement in nausea vomiting and pain while morphine was available.  Objective: Vitals:   06/03/23 0739 06/03/23 1123 06/03/23 1439 06/03/23 1457  BP: (!) 158/96  (!) 183/103 (!) 185/105  Pulse:      Resp:    (!) 22  Temp: 98.2 F (36.8 C) 97.6 F (36.4 C) 98.1 F (36.7 C) 98 F (36.7 C)  TempSrc: Oral Oral Oral Oral  SpO2:      Weight:      Height:        Examination:  General:  Pleasantly resting in bed, No acute distress. HEENT:  Normocephalic atraumatic.  Sclerae nonicteric, noninjected.  Extraocular movements intact bilaterally. Neck:  Without mass or deformity.  Trachea is midline. Lungs:  Clear to auscultate bilaterally without rhonchi, wheeze, or rales. Heart:  Regular rate and rhythm.  Without murmurs, rubs, or gallops. Abdomen: Moderately distended, diffusely tender with no PMI Extremities: Without cyanosis, clubbing, edema, or obvious deformity. Skin:  Warm and dry, no erythema.   LOS: 6 days   Time spent:  Alexander Fallen, DO [facility]  If 7PM-7AM, please contact night-coverage [email or web address]  06/04/2023, 7:15 AM

## 2023-06-05 ENCOUNTER — Telehealth: Payer: Self-pay | Admitting: *Deleted

## 2023-06-05 NOTE — Telephone Encounter (Signed)
===  View-only below this line=== ----- Message ----- From: Napoleon Form, MD Sent: 06/04/2023   2:50 PM EDT To: Marlowe Kays, CMA; Evalee Jefferson, LPN  Can you bring him in for office follow up visit non urgent for IPMN in 6 months. He is currently hospitalized at [facility].  Thanks

## 2023-06-18 ENCOUNTER — Ambulatory Visit (INDEPENDENT_AMBULATORY_CARE_PROVIDER_SITE_OTHER): Payer: Medicare HMO | Admitting: Dermatology

## 2023-06-18 ENCOUNTER — Encounter: Payer: Self-pay | Admitting: Dermatology

## 2023-06-18 DIAGNOSIS — L578 Other skin changes due to chronic exposure to nonionizing radiation: Secondary | ICD-10-CM

## 2023-06-18 DIAGNOSIS — Z5111 Encounter for antineoplastic chemotherapy: Secondary | ICD-10-CM

## 2023-06-18 DIAGNOSIS — D692 Other nonthrombocytopenic purpura: Secondary | ICD-10-CM

## 2023-06-18 DIAGNOSIS — D1801 Hemangioma of skin and subcutaneous tissue: Secondary | ICD-10-CM

## 2023-06-18 DIAGNOSIS — Z85828 Personal history of other malignant neoplasm of skin: Secondary | ICD-10-CM

## 2023-06-18 DIAGNOSIS — Z1283 Encounter for screening for malignant neoplasm of skin: Secondary | ICD-10-CM | POA: Diagnosis not present

## 2023-06-18 DIAGNOSIS — Z85820 Personal history of malignant melanoma of skin: Secondary | ICD-10-CM

## 2023-06-18 DIAGNOSIS — W908XXA Exposure to other nonionizing radiation, initial encounter: Secondary | ICD-10-CM | POA: Diagnosis not present

## 2023-06-18 DIAGNOSIS — D229 Melanocytic nevi, unspecified: Secondary | ICD-10-CM

## 2023-06-18 DIAGNOSIS — Z872 Personal history of diseases of the skin and subcutaneous tissue: Secondary | ICD-10-CM

## 2023-06-18 DIAGNOSIS — L821 Other seborrheic keratosis: Secondary | ICD-10-CM

## 2023-06-18 DIAGNOSIS — L814 Other melanin hyperpigmentation: Secondary | ICD-10-CM

## 2023-06-18 NOTE — Progress Notes (Signed)
Follow-Up Visit   Subjective  Alexander Howe is a 77 y.o. male who presents for the following: Skin Cancer Screening and Full Body Skin Exam  The patient presents for Total-Body Skin Exam (TBSE) for skin cancer screening and mole check. The patient has spots, moles and lesions to be evaluated, some may be new or changing and the patient may have concern these could be cancer.  Patient with hx of melanoma, SCC, BCC and AK's. Patient advises his wife has circled 3 spots that they are concerned about.   Patient recently hospitalized for gallstones and has been diagnosed with liver cancer.    The following portions of the chart were reviewed this encounter and updated as appropriate: medications, allergies, medical history  Review of Systems:  No other skin or systemic complaints except as noted in HPI or Assessment and Plan.  Objective  Well appearing patient in no apparent distress; mood and affect are within normal limits.  A full examination was performed including scalp, head, eyes, ears, nose, lips, neck, chest, axillae, abdomen, back, buttocks, bilateral upper extremities, bilateral lower extremities, hands, feet, fingers, toes, fingernails, and toenails. All findings within normal limits unless otherwise noted below.   Relevant physical exam findings are noted in the Assessment and Plan.    Assessment & Plan   SKIN CANCER SCREENING PERFORMED TODAY.  ACTINIC DAMAGE WITH PRECANCEROUS ACTINIC KERATOSES Counseling for Topical Chemotherapy Management: Patient exhibits: - Severe, confluent actinic changes with pre-cancerous actinic keratoses that is secondary to cumulative UV radiation exposure over time on scalp, hands, forearms - Condition that is severe; chronic, not at goal. - diffuse scaly erythematous macules and papules with underlying dyspigmentation - Discussed Prescription "Field Treatment" topical Chemotherapy for Severe, Chronic Confluent Actinic Changes with  Pre-Cancerous Actinic Keratoses Field treatment involves treatment of an entire area of skin that has confluent Actinic Changes (Sun/ Ultraviolet light damage) and PreCancerous Actinic Keratoses by method of PhotoDynamic Therapy (PDT) and/or prescription Topical Chemotherapy agents such as 5-fluorouracil, 5-fluorouracil/calcipotriene, and/or imiquimod.  The purpose is to decrease the number of clinically evident and subclinical PreCancerous lesions to prevent progression to development of skin cancer by chemically destroying early precancer changes that may or may not be visible.  It has been shown to reduce the risk of developing skin cancer in the treated area. As a result of treatment, redness, scaling, crusting, and open sores may occur during treatment course. One or more than one of these methods may be used and may have to be used several times to control, suppress and eliminate the PreCancerous changes. Discussed treatment course, expected reaction, and possible side effects. - Recommend daily broad spectrum sunscreen SPF 30+ to sun-exposed areas, reapply every 2 hours as needed.  - Staying in the shade or wearing long sleeves, sun glasses (UVA+UVB protection) and wide brim hats (4-inch brim around the entire circumference of the hat) are also recommended. - Call for new or changing lesions. - Face and scale debrided today prior to treatment with 5FU/calcipotriene. - Sample of CeraVe Rough and Bumpy given to patient to use prior to treatment. - Patient plants to start 5-fluorouracil/calcipotriene cream twice daily for 7 days to scalp. Other medical conditions may take priority. Patient has prescription from [facility]. Patient advised they will receive an email to purchase the medication online and have it sent to their home. Patient provided with handout reviewing treatment course and side effects and advised to call or message [place] on MyChart with any concerns.  LENTIGINES,  SEBORRHEIC KERATOSES  (lesions of concern x 3), HEMANGIOMAS - Benign normal skin lesions - Benign-appearing - Call for any changes  MELANOCYTIC NEVI - Tan-brown and/or pink-flesh-colored symmetric macules and papules - Benign appearing on exam today - Observation - Call clinic for new or changing moles - Recommend daily use of broad spectrum spf 30+ sunscreen to sun-exposed areas.   Purpura - Chronic; persistent and recurrent.  Treatable, but not curable. - Violaceous macules and patches - Benign - Related to trauma, age, sun damage and/or use of blood thinners, chronic use of topical and/or oral steroids - Observe - Can use OTC arnica containing moisturizer such as Dermend Bruise Formula if desired - Call for worsening or other concerns  HISTORY OF MELANOMA IN SITU - No evidence of recurrence today at right upper back - Recommend regular full body skin exams - Recommend daily broad spectrum sunscreen SPF 30+ to sun-exposed areas, reapply every 2 hours as needed.  - Call if any new or changing lesions are noted between office visits  HISTORY OF BASAL CELL CARCINOMA OF THE SKIN - No evidence of recurrence today - Recommend regular full body skin exams - Recommend daily broad spectrum sunscreen SPF 30+ to sun-exposed areas, reapply every 2 hours as needed.  - Call if any new or changing lesions are noted between office visits  HISTORY OF SQUAMOUS CELL CARCINOMA OF THE SKIN - No evidence of recurrence today - No lymphadenopathy - Recommend regular full body skin exams - Recommend daily broad spectrum sunscreen SPF 30+ to sun-exposed areas, reapply every 2 hours as needed.  - Call if any new or changing lesions are noted between office visits   Return in about 6 months (around 12/19/2023) for TBSE, Hx MMis, Hx SCC, Hx BCC, Hx AK.  Anise Salvo, RMA, am acting as scribe for Elie Goody, MD .   Documentation: I have reviewed the above documentation for accuracy and completeness, and I agree  with the above.  Elie Goody, MD

## 2023-06-18 NOTE — Patient Instructions (Addendum)
Seborrheic Keratosis  What causes seborrheic keratoses? Seborrheic keratoses are harmless, common skin growths that first appear during adult life.  As time goes by, more growths appear.  Some people may develop a large number of them.  Seborrheic keratoses appear on both covered and uncovered body parts.  They are not caused by sunlight.  The tendency to develop seborrheic keratoses can be inherited.  They vary in color from skin-colored to gray, brown, or even black.  They can be either smooth or have a rough, warty surface.   Seborrheic keratoses are superficial and look as if they were stuck on the skin.  Under the microscope this type of keratosis looks like layers upon layers of skin.  That is why at times the top layer may seem to fall off, but the rest of the growth remains and re-grows.    Treatment Seborrheic keratoses do not need to be treated, but can easily be removed in the office.  Seborrheic keratoses often cause symptoms when they rub on clothing or jewelry.  Lesions can be in the way of shaving.  If they become inflamed, they can cause itching, soreness, or burning.  Removal of a seborrheic keratosis can be accomplished by freezing, burning, or surgery. If any spot bleeds, scabs, or grows rapidly, please return to have it checked, as these can be an indication of a skin cancer.   Melanoma ABCDEs  Melanoma is the most dangerous type of skin cancer, and is the leading cause of death from skin disease.  You are more likely to develop melanoma if you: Have light-colored skin, light-colored eyes, or red or blond hair Spend a lot of time in the sun Tan regularly, either outdoors or in a tanning bed Have had blistering sunburns, especially during childhood Have a close family member who has had a melanoma Have atypical moles or large birthmarks  Early detection of melanoma is key since treatment is typically straightforward and cure rates are extremely high if we catch it early.    The first sign of melanoma is often a change in a mole or a new dark spot.  The ABCDE system is a way of remembering the signs of melanoma.  A for asymmetry:  The two halves do not match. B for border:  The edges of the growth are irregular. C for color:  A mixture of colors are present instead of an even brown color. D for diameter:  Melanomas are usually (but not always) greater than 6mm - the size of a pencil eraser. E for evolution:  The spot keeps changing in size, shape, and color.  Please check your skin once per month between visits. You can use a small mirror in front and a large mirror behind you to keep an eye on the back side or your body.   If you see any new or changing lesions before your next follow-up, please call to schedule a visit.  Please continue daily skin protection including broad spectrum sunscreen SPF 30+ to sun-exposed areas, reapplying every 2 hours as needed when you're outdoors.    Due to recent changes in healthcare laws, you may see results of your pathology and/or laboratory studies on MyChart before the doctors have had a chance to review them. We understand that in some cases there may be results that are confusing or concerning to you. Please understand that not all results are received at the same time and often the doctors may need to interpret multiple results in order  to provide you with the best plan of care or course of treatment. Therefore, we ask that you please give [place] 2 business days to thoroughly review all your results before contacting the office for clarification. Should we see a critical lab result, you will be contacted sooner.   If You Need Anything After Your Visit  If you have any questions or concerns for your doctor, please call our main line at [phone] and press option 4 to reach your doctor's medical assistant. If no one answers, please leave a voicemail as directed and we will return your call as soon as possible. Messages left  after 4 pm will be answered the following business day.   You may also send [place] a message via MyChart. We typically respond to MyChart messages within 1-2 business days.  For prescription refills, please ask your pharmacy to contact our office. Our fax number is [phone].  If you have an urgent issue when the clinic is closed that cannot wait until the next business day, you can page your doctor at the number below.    Please note that while we do our best to be available for urgent issues outside of office hours, we are not available 24/7.   If you have an urgent issue and are unable to reach [place], you may choose to seek medical care at your doctor's office, retail clinic, urgent care center, or emergency room.  If you have a medical emergency, please immediately call 911 or go to the emergency department.  Pager Numbers  - Dr. Gwen Pounds: [phone]  - Dr. Roseanne Reno: [phone]  In the event of inclement weather, please call our main line at [phone] for an update on the status of any delays or closures.  Dermatology Medication Tips: Please keep the boxes that topical medications come in in order to help keep track of the instructions about where and how to use these. Pharmacies typically print the medication instructions only on the boxes and not directly on the medication tubes.   If your medication is too expensive, please contact our office at [phone] option 4 or send [place] a message through MyChart.   We are unable to tell what your co-pay for medications will be in advance as this is different depending on your insurance coverage. However, we may be able to find a substitute medication at lower cost or fill out paperwork to get insurance to cover a needed medication.   If a prior authorization is required to get your medication covered by your insurance company, please allow [place] 1-2 business days to complete this process.  Drug prices often vary depending on where the  prescription is filled and some pharmacies may offer cheaper prices.  The website www.goodrx.com contains coupons for medications through different pharmacies. The prices here do not account for what the cost may be with help from insurance (it may be cheaper with your insurance), but the website can give you the price if you did not use any insurance.  - You can print the associated coupon and take it with your prescription to the pharmacy.  - You may also stop by our office during regular business hours and pick up a GoodRx coupon card.  - If you need your prescription sent electronically to a different pharmacy, notify our office through [facility] or by phone at [phone] option 4.

## 2023-06-27 NOTE — Telephone Encounter (Signed)
There are no January appointments available yet. I put in a recall reminder for an office visit for January

## 2023-07-12 ENCOUNTER — Ambulatory Visit: Payer: Medicare HMO | Admitting: General Practice

## 2023-12-12 ENCOUNTER — Ambulatory Visit: Payer: Medicare Other | Attending: Internal Medicine

## 2023-12-12 DIAGNOSIS — R262 Difficulty in walking, not elsewhere classified: Secondary | ICD-10-CM | POA: Insufficient documentation

## 2023-12-12 DIAGNOSIS — R2681 Unsteadiness on feet: Secondary | ICD-10-CM | POA: Insufficient documentation

## 2023-12-12 DIAGNOSIS — M6281 Muscle weakness (generalized): Secondary | ICD-10-CM | POA: Diagnosis present

## 2023-12-12 NOTE — Therapy (Unsigned)
OUTPATIENT PHYSICAL THERAPY NEURO EVALUATION   Patient Name: Alexander Howe MRN: [id number] DOB:[birth year], 78 y.o., male Today's Date: 12/13/2023   PCP: [name], MD  REFERRING PROVIDER:  [name], MD     END OF SESSION:  PT End of Session - 12/13/23 9528     Visit Number 1    Number of Visits 25    Date for PT Re-Evaluation 03/05/24    PT Start Time 1317    PT Stop Time 1400    PT Time Calculation (min) 43 min    Equipment Utilized During Treatment Gait belt    Activity Tolerance Patient tolerated treatment well    Behavior During Therapy WFL for tasks assessed/performed             Past Medical History:  Diagnosis Date   AAA (abdominal aortic aneurysm) without rupture (HCC)    Abdominal aneurysm (HCC)    3.5 cm   Actinic keratosis 08/26/2020   right lateral chest   Anxiety    Atrial fibrillation (HCC) 05/30/2023   Basal cell carcinoma 09/18/2018   left upper back/one excised, one superficial   Basal cell carcinoma 11/28/2018   left post shoulder   Basal cell carcinoma 08/26/2020   left medial pretibial, EDC 10/2020   Basal cell carcinoma 03/15/2022   right neck, EDC   Basal cell carcinoma (BCC) 08/31/2022   Posterior base of neck superficial. 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   right lateral chest, EDC 12/09/20   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(HCC)    Venous stasis    Venous stasis    Vitamin B 12 deficiency    Past Surgical History:  Procedure Laterality Date   BIOPSY  05/30/2023   Procedure: BIOPSY;  Surgeon: Iva Boop, MD;  Location:  [facility] ENDOSCOPY;  Service: Gastroenterology;;   COLONOSCOPY W/ POLYPECTOMY     COLONOSCOPY WITH PROPOFOL N/A 04/05/2018   Procedure: COLONOSCOPY WITH PROPOFOL;  Surgeon: Scot Jun, MD;  Location: [facility] ENDOSCOPY;  Service: Endoscopy;  Laterality: N/A;   COLOSTOMY     COLOSTOMY REVERSAL     CYST REMOVAL LEG     ENDOSCOPIC RETROGRADE CHOLANGIOPANCREATOGRAPHY (ERCP) WITH PROPOFOL N/A 05/30/2023   Procedure: ENDOSCOPIC RETROGRADE CHOLANGIOPANCREATOGRAPHY (ERCP) WITH PROPOFOL;  Surgeon: Iva Boop, MD;  Location: [facility] ENDOSCOPY;  Service: Gastroenterology;  Laterality: N/A;   KNEE ARTHROPLASTY Right 12/27/2015   Procedure: COMPUTER ASSISTED TOTAL KNEE ARTHROPLASTY;  Surgeon: Donato Heinz, MD;  Location: [facility] ORS;  Service: Orthopedics;  Laterality: Right;   KNEE ARTHROSCOPY Left    LIPOMA EXCISION     right thigh   LITHOTRIPSY     PANCREATIC STENT PLACEMENT  05/30/2023   Procedure: PANCREATIC STENT PLACEMENT;  Surgeon: Iva Boop, MD;  Location: [facility] ENDOSCOPY;  Service: Gastroenterology;;   TOTAL KNEE ARTHROPLASTY Left 10/16/2012   Patient Active Problem List   Diagnosis Date Noted   Protein-calorie malnutrition, severe 06/02/2023  Calculus of GB and bile duct w acute cholecyst w/o obst 06/02/2023   Acute pancreatitis without infection or necrosis 06/01/2023   Post-ERCP acute pancreatitis 05/31/2023   Choledocholithiasis 05/31/2023   Gastritis and gastroduodenitis 05/30/2023   Atrial fibrillation (HCC) 05/30/2023   Elevated bilirubin 05/29/2023   Generalized abdominal pain 05/29/2023   Cholelithiasis with choledocholithiasis 05/29/2023   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   Elevated LFTs 05/28/2023   OSA on CPAP 05/28/2023   Ascending aortic aneurysm (HCC) 07/26/2020   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   S/P total knee arthroplasty 12/27/2015   Major depressive disorder with single episode, in full remission (HCC) 12/23/2015   Anxiety 11/16/2015   Benign  essential hypertension 11/16/2015   Depression 11/16/2015   Hypersomnia with sleep apnea 11/16/2015   Osteoarthritis 11/16/2015   Pure hypercholesterolemia 11/16/2015   Abdominal aortic aneurysm without rupture (HCC) 12/23/2014   Chronic kidney disease, stage 3 unspecified (HCC) 12/17/2014    ONSET DATE: May 27 2023  REFERRING DIAG: E44.1 (ICD-10-CM) - Mild protein-calorie malnutrition   THERAPY DIAG:  Muscle weakness (generalized)  Difficulty in walking, not elsewhere classified  Unsteadiness on feet  Rationale for Evaluation and Treatment: Rehabilitation  SUBJECTIVE:                                                                                                                                                                                             SUBJECTIVE STATEMENT: The pt is a pleasant 78 yo male presenting to PT with concerns of weakness with recent findings of liver metastasis with unknown primary site. Pt reports gallbladder attack July 14th 2024, went to ED, reports they found a "stone blockage." Pt then transferred to [facility] where ERCP was performed to remove stones of common bile duct. This was complicated by pancreatitis, heart failure and new onset of a-fib. He reports MRI taken at time where they found cancer in his liver. Pt has undergone 8 sessions of chemo, he also has a cholecystostomy tube that rests on his R side and down RLE. Pt reports future plans for procedure to "cut off blood supply" to R side of his liver and to remove gallbladder. Pt also with recent R portal vein, R hepatic vein embolization 12/04/23.  Recommendation for PT came following chemical stress test, where he reports physician wants him to get stronger before future procedures/surgeries. He has a follow-up in about 4 weeks. Pt notes decreased gait speed, cautious with balance, and decreased strength. No falls. He has constant pain on his R side he reports is due to gallbladder. He has a treadmill at  home that he has  been using and he likes to walk his dog. 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   Pt accompanied by: self  PERTINENT HISTORY:   PMH significant for CA, mets to liver, HF, hx of 2 knee replacements (years ago), a-fib, AAA, basal cell carcinoma, chronic kidney disease, diverticulitis, HTN, melanoma, OA, sleep apnea, vitamin b12 deficiency, chemo 07/2023, 10/2023 cholecystostomy tube placement, 12/04/2023 R portal vein, R hepatic vein emobolization, please refer to chart for full details.    PAIN:  Are you having pain? R side pain, reports due to gallbladder pain, cholecystostomy tube placement in this region  PRECAUTIONS: Fall and Other:    cholecystostomy tube; pt with ascending thoracic aneurism under surveillance,  advised by his physician to progress PT slowly ;  RED FLAGS: Abdominal aneurysm: Yes: says has been diagnosed with 3, being followed for this     WEIGHT BEARING RESTRICTIONS: No  FALLS: Has patient fallen in last 6 months? No  LIVING ENVIRONMENT: Lives with: lives with their spouse and and dog  Lives in: House/apartment Stairs: Yes: External: 2 steps; on left going up Has following equipment at home:  reports able to sit down in his shower, has chair  PLOF: Independent  PATIENT GOALS: improve strength  OBJECTIVE:  Note: Objective measures were completed at Evaluation unless otherwise noted.  DIAGNOSTIC FINDINGS:  Via chart PET myocardial perfusion stress and rest 11/30/2023 "   FINAL COMMENTS   Non-diagnostic CT obtained for attenuation correction: Coronary artery calcifications are present. Aortic atherosclerosis.   Conclusions:.   1. Myocardial perfusion is abnormal with evidence of ischemia  in the LCX.   2. Global/regional myocardial blood flow reserve is abnormal. There is evidence of a flow limiting stenosis in the LCX and   coronary microvascular dysfunction.   3. Normal left ventricular systolic  function.      Electronically Reviewed by: Pamala Hurry   (Electronically Signed)   Final Date: 30 November 2023 17:03 "  MRI abdomen 11/18/2023 " Impression: " Impression:  1. Similar size of the enhancing hepatic segment 7 lesion measuring up to  4.5 cm, biopsy-proven adenocarcinoma. No other suspicious hepatic lesions  in the liver.  2.  Decompressed gallbladder with interval removal of the cholecystostomy  tube. Small fluid collection adjacent to/within the gallbladder neck,  possibly an intramural abscess.  3.  Previously described indeterminant 1.1 cm right renal lesion is  compatible with a simple cyst.   Electronically Reviewed by:  Consuelo Pandy, MD, [facility]  Electronically Reviewed on:  11/19/2023 10:21 AM   I have reviewed the images and concur with the above findings.   Electronically Signed by:  Gypsy Decant, MD, [facility]  Electronically Signed on:  11/19/2023 10:46 AM "   Extensive imagine history, please refer to chart for full details   COGNITION: Overall cognitive status: Within functional limits for tasks assessed   SENSATION: WNL BLE   COORDINATION: Rapid alt UE and LE WNL BLE and BUE Chin to target WNL Heel>shin WNL   EDEMA:  He reports he has some ankle swelling, reports onset "since this all began"    POSTURE:  slight fwd posture, rounded shoulders   LOWER EXTREMITY MMT:   LLE grossly 4+/5 RLE grossly 4/5 proximal, 4+/5 distal   GAIT: Gait pattern:  Decreased gait speed, observed decrease in step length, decreased trunk rotation Distance walked: , clinic distances    FUNCTIONAL TESTS:   : 0.8 m/s  5xSTS:  16  sec use of BUE   TUG: 8.25 sec   Berg: deferred   : deferred   PATIENT SURVEYS:  deferred                                                                                                                              TREATMENT DATE:  EVAL ONLY    PATIENT EDUCATION: Education details: assessment  findings, goals, plan Person educated: Patient Education method: Explanation Education comprehension: verbalized understanding  HOME EXERCISE PROGRAM: To be initiated next 1-2 visits, but does recommend continuing to progress duration of ambulation at this time (try adding on an extra 30 sec to 2-3 min)  GOALS: Goals reviewed with patient? Yes  SHORT TERM GOALS: Target date: 01/23/2024     Patient will be independent in home exercise program to improve strength/mobility for better functional independence with ADLs. Baseline: Goal status: INITIAL   LONG TERM GOALS: Target date: 03/04/2024     1.  Patient (> 44 years old) will complete five times sit to stand test in < 15 seconds indicating an increased LE strength and improved balance. Baseline: 16 sec use of BUE, very fatiguing  Goal status: INITIAL  3.  Patient will increase Berg Balance score by > 6 points to demonstrate decreased fall risk during functional activities Baseline:  Goal status: INITIAL  4.  Patient will increase 10 meter walk test to >1.21m/s as to improve gait speed for better community ambulation and to reduce fall risk. Baseline: 0.8 m/s Goal status: INITIAL  5.  Patient will increase R proximal LE strength to at least 4+/5 on MMT for improved gait ability Baseline: RLE grossly 4/5 proximal Goal status: INITIAL  6.  Patient will increase six minute walk test distance to >1000 for progression to community ambulator and improve gait ability  Baseline:  Goal status: INITIAL   7. Patient will increase ABC scale score >80% to demonstrate better functional mobility and better confidence with ADLs.   Baseline:  Goal status: INITIAL   ASSESSMENT:  CLINICAL IMPRESSION: Patient is a pleasant 78 y.o. male who was seen today for physical therapy evaluation and treatment for weakness following recent CA diagnosis and 8 sessions of chemo. Pt with multiple recent procedures (see hx) and currently has  cholecystostomy tube and is also being monitored for ascending thoracic aneurism. Exam indicates deficits in gait, endurance, strength, pain and increased fall risk AEB performance on , MMT and 5xSTS. The pt will benefit from further skilled PT to address deficits in order to improve strength, endurance, balance, and functional mobility to increase QOL and decrease fall risk.  OBJECTIVE IMPAIRMENTS: Abnormal gait, decreased activity tolerance, decreased balance, decreased endurance, decreased mobility, difficulty walking, decreased strength, increased edema, improper body mechanics, postural dysfunction, and pain.   ACTIVITY LIMITATIONS: lifting, squatting, transfers, and locomotion level  PARTICIPATION LIMITATIONS: cleaning, shopping, community activity, and yard work  PERSONAL FACTORS: Age and 3+ comorbidities: PMH significant for CA, mets  to liver, HF, hx of 2 knee replacements (years ago), a-fib, AAA, basal cell carcinoma, chronic kidney disease, diverticulitis, HTN, melanoma, OA, sleep apnea, vitamin b12 deficiency, chemo 07/2023  are also affecting patient's functional outcome.   REHAB POTENTIAL: Good  CLINICAL DECISION MAKING: Evolving/moderate complexity  EVALUATION COMPLEXITY: Moderate  PLAN:  PT FREQUENCY: 1-2x/week  PT DURATION: 12 weeks  PLANNED INTERVENTIONS: 97164- PT Re-evaluation, 97110-Therapeutic exercises, 97530- Therapeutic activity, 97112- Neuromuscular re-education, 97535- Self Care, 16109- Manual therapy, [phone]- Gait training, [phone]- Orthotic Fit/training, Patient/Family education, Balance training, Stair training, Joint mobilization, Spinal mobilization, Vestibular training, DME instructions, Cryotherapy, and Moist heat  PLAN FOR NEXT SESSION: initiate HEP, complete testing   Baird Kay, PT 12/13/2023, 9:47 AM

## 2023-12-18 ENCOUNTER — Ambulatory Visit: Payer: Medicare HMO | Admitting: Dermatology

## 2023-12-18 ENCOUNTER — Ambulatory Visit: Payer: Medicare Other | Attending: Internal Medicine

## 2023-12-18 DIAGNOSIS — R262 Difficulty in walking, not elsewhere classified: Secondary | ICD-10-CM | POA: Diagnosis present

## 2023-12-18 DIAGNOSIS — R2681 Unsteadiness on feet: Secondary | ICD-10-CM | POA: Insufficient documentation

## 2023-12-18 DIAGNOSIS — M6281 Muscle weakness (generalized): Secondary | ICD-10-CM | POA: Diagnosis present

## 2023-12-18 NOTE — Therapy (Signed)
 OUTPATIENT PHYSICAL THERAPY NEURO TREATMENT   Patient Name: Alexander Howe MRN: [id number] DOB:12-May-1946, 78 y.o., male Today's Date: 12/18/2023   PCP: Fernande Ophelia JINNY DOUGLAS, MD  REFERRING PROVIDER:  Fernande Ophelia JINNY DOUGLAS, MD     END OF SESSION:  PT End of Session - 12/18/23 1906     Visit Number 2    Number of Visits 25    Date for PT Re-Evaluation 03/05/24    PT Start Time 1403    PT Stop Time 1445    PT Time Calculation (min) 42 min    Equipment Utilized During Treatment Gait belt    Activity Tolerance Patient tolerated treatment well;Patient limited by fatigue    Behavior During Therapy WFL for tasks assessed/performed              Past Medical History:  Diagnosis Date   AAA (abdominal aortic aneurysm) without rupture (HCC)    Abdominal aneurysm (HCC)    3.5 cm   Actinic keratosis 08/26/2020   right lateral chest   Anxiety    Atrial fibrillation (HCC) 05/30/2023   Basal cell carcinoma 09/18/2018   left upper back/one excised, one superficial   Basal cell carcinoma 11/28/2018   left post shoulder   Basal cell carcinoma 08/26/2020   left medial pretibial, EDC 10/2020   Basal cell carcinoma 03/15/2022   right neck, EDC   Basal cell carcinoma (BCC) 08/31/2022   Posterior base of neck superficial. 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   right lateral chest, EDC 12/09/20   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(HCC)    Venous stasis    Venous stasis    Vitamin B 12 deficiency    Past Surgical History:  Procedure Laterality Date   BIOPSY  05/30/2023   Procedure: BIOPSY;  Surgeon:  Avram Lupita BRAVO, MD;  Location: [facility] ENDOSCOPY;  Service: Gastroenterology;;   COLONOSCOPY W/ POLYPECTOMY     COLONOSCOPY WITH PROPOFOL  N/A 04/05/2018   Procedure: COLONOSCOPY WITH PROPOFOL ;  Surgeon: Viktoria Lamar DASEN, MD;  Location: [facility] ENDOSCOPY;  Service: Endoscopy;  Laterality: N/A;   COLOSTOMY     COLOSTOMY REVERSAL     CYST REMOVAL LEG     ENDOSCOPIC RETROGRADE CHOLANGIOPANCREATOGRAPHY (ERCP) WITH PROPOFOL  N/A 05/30/2023   Procedure: ENDOSCOPIC RETROGRADE CHOLANGIOPANCREATOGRAPHY (ERCP) WITH PROPOFOL ;  Surgeon: Avram Lupita BRAVO, MD;  Location: [facility] ENDOSCOPY;  Service: Gastroenterology;  Laterality: N/A;   KNEE ARTHROPLASTY Right 12/27/2015   Procedure: COMPUTER ASSISTED TOTAL KNEE ARTHROPLASTY;  Surgeon: Lynwood SHAUNNA Hue, MD;  Location: [facility] ORS;  Service: Orthopedics;  Laterality: Right;   KNEE ARTHROSCOPY Left    LIPOMA EXCISION     right thigh   LITHOTRIPSY     PANCREATIC STENT PLACEMENT  05/30/2023   Procedure: PANCREATIC STENT PLACEMENT;  Surgeon: Avram Lupita BRAVO, MD;  Location: [facility] ENDOSCOPY;  Service: Gastroenterology;;   TOTAL KNEE ARTHROPLASTY Left 10/16/2012   Patient Active Problem List   Diagnosis Date Noted  Protein-calorie malnutrition, severe 06/02/2023   Calculus of GB and bile duct w acute cholecyst w/o obst 06/02/2023   Acute pancreatitis without infection or necrosis 06/01/2023   Post-ERCP acute pancreatitis 05/31/2023   Choledocholithiasis 05/31/2023   Gastritis and gastroduodenitis 05/30/2023   Atrial fibrillation (HCC) 05/30/2023   Elevated bilirubin 05/29/2023   Generalized abdominal pain 05/29/2023   Cholelithiasis with choledocholithiasis 05/29/2023   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   Elevated LFTs 05/28/2023   OSA on CPAP 05/28/2023   Ascending aortic aneurysm (HCC) 07/26/2020   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   S/P total knee arthroplasty 12/27/2015   Major depressive disorder with single episode, in full remission (HCC) 12/23/2015    Anxiety 11/16/2015   Benign essential hypertension 11/16/2015   Depression 11/16/2015   Hypersomnia with sleep apnea 11/16/2015   Osteoarthritis 11/16/2015   Pure hypercholesterolemia 11/16/2015   Abdominal aortic aneurysm without rupture (HCC) 12/23/2014   Chronic kidney disease, stage 3 unspecified (HCC) 12/17/2014    ONSET DATE: May 27 2023  REFERRING DIAG: E44.1 (ICD-10-CM) - Mild protein-calorie malnutrition   THERAPY DIAG:  Muscle weakness (generalized)  Difficulty in walking, not elsewhere classified  Unsteadiness on feet  Rationale for Evaluation and Treatment: Rehabilitation  SUBJECTIVE:                                                                                                                                                                                             SUBJECTIVE STATEMENT: Pt reports no updates, tired today.   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 Pt reports gallbladder attack July 14th 2024, went to ED, reports they found a stone blockage. Pt then transferred to [facility] where ERCP was performed to remove stones of common bile duct. This was complicated by pancreatitis, heart failure and new onset of a-fib. He reports MRI taken at time where they found cancer in his liver. Pt has undergone 8 sessions of chemo, he also has a cholecystostomy tube that rests on his R side and down RLE. Pt reports future plans for procedure to cut off blood supply to R side of his liver and to remove gallbladder. Pt also with recent R portal vein, R hepatic vein embolization 12/04/23.  Recommendation for PT came following chemical stress test, where he reports physician wants him to get stronger before future procedures/surgeries. He has a follow-up in about 4 weeks. Pt notes decreased gait speed, cautious with balance, and  decreased strength. No falls.  He has constant pain on his R side he reports is due to gallbladder. He has a treadmill at home that he has been using and he likes to walk his dog. 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   PMH significant for CA, mets to liver, HF, hx of 2 knee replacements (years ago), a-fib, AAA, basal cell carcinoma, chronic kidney disease, diverticulitis, HTN, melanoma, OA, sleep apnea, vitamin b12 deficiency, chemo 07/2023, 10/2023 cholecystostomy tube placement, 12/04/2023 R portal vein, R hepatic vein emobolization, please refer to chart for full details.    PAIN:  Are you having pain? R side pain, reports due to gallbladder pain, cholecystostomy tube placement in this region  PRECAUTIONS: Fall and Other:    cholecystostomy tube; pt with ascending thoracic aneurism under surveillance,  advised by his physician to progress PT slowly ;  RED FLAGS: Abdominal aneurysm: Yes: says has been diagnosed with 3, being followed for this     WEIGHT BEARING RESTRICTIONS: No  FALLS: Has patient fallen in last 6 months? No  LIVING ENVIRONMENT: Lives with: lives with their spouse and and dog  Lives in: House/apartment Stairs: Yes: External: 2 steps; on left going up Has following equipment at home:  reports able to sit down in his shower, has chair  PLOF: Independent  PATIENT GOALS: improve strength  OBJECTIVE:  Note: Objective measures were completed at Evaluation unless otherwise noted.  DIAGNOSTIC FINDINGS:  Via chart PET myocardial perfusion stress and rest 11/30/2023    FINAL COMMENTS   Non-diagnostic CT obtained for attenuation correction: Coronary artery calcifications are present. Aortic atherosclerosis.   Conclusions:.   1. Myocardial perfusion is abnormal with evidence of ischemia  in the LCX.   2. Global/regional myocardial blood flow reserve is abnormal. There is evidence of a flow limiting stenosis in the LCX and    coronary microvascular dysfunction.   3. Normal left ventricular systolic function.      Electronically Reviewed by: Ezella Agent   (Electronically Signed)   Final Date: 30 November 2023 17:03   MRI abdomen 11/18/2023  Impression:  Impression:  1. Similar size of the enhancing hepatic segment 7 lesion measuring up to  4.5 cm, biopsy-proven adenocarcinoma. No other suspicious hepatic lesions  in the liver.  2.  Decompressed gallbladder with interval removal of the cholecystostomy  tube. Small fluid collection adjacent to/within the gallbladder neck,  possibly an intramural abscess.  3.  Previously described indeterminant 1.1 cm right renal lesion is  compatible with a simple cyst.   Electronically Reviewed by:  Devin Cao, MD, [facility]  Electronically Reviewed on:  11/19/2023 10:21 AM   I have reviewed the images and concur with the above findings.   Electronically Signed by:  Toya Irving, MD, [facility]  Electronically Signed on:  11/19/2023 10:46 AM    Extensive imagine history, please refer to chart for full details   COGNITION: Overall cognitive status: Within functional limits for tasks assessed   SENSATION: WNL BLE   COORDINATION: Rapid alt UE and LE WNL BLE and BUE Chin to target WNL Heel>shin WNL   EDEMA:  He reports he has some ankle swelling, reports onset since this all began    POSTURE:  slight fwd posture, rounded shoulders   LOWER EXTREMITY MMT:   LLE grossly 4+/5 RLE grossly 4/5 proximal, 4+/5 distal   GAIT: Gait pattern:  Decreased gait speed, observed decrease in step length, decreased trunk rotation Distance walked: , clinic distances  FUNCTIONAL TESTS:   : 0.8 m/s  5xSTS:  16 sec use of BUE   TUG: 8.25 sec   Berg: deferred   : deferred   PATIENT SURVEYS:  deferred                                                                                                                              TREATMENT  DATE: 12/18/23     Physical Performance:  Lars: 55/56  : 754 ft   TA: Seated march 3x10 each LE -  STS 2x5 with instruction in STS technique, forward in chair, nose over toes   Review of HEP & instruction to progress reps over time, take rest breaks as needed Access Code: [email or web address] URL: [email or web address] Date: 12/18/2023 Prepared by: Darryle Patten  Exercises - Seated March  - 2 x daily - 5-7 x weekly - 3 sets - 10 reps - Sit to Stand with Counter Support  - 2 x daily - 5-7 x weekly - 2 sets - 5-10 reps - Standing Tandem Balance with Counter Support  - 1 x daily - 7 x weekly - 2 sets - 1 reps - 30 sec/leg hold  PATIENT EDUCATION: Education details: assessment findings, HEP Person educated: Patient Education method: Explanation Education comprehension: verbalized understanding  HOME EXERCISE PROGRAM: Access Code: [email or web address] URL: [email or web address] Date: 12/18/2023 Prepared by: Darryle Patten  Exercises - Seated March  - 2 x daily - 5-7 x weekly - 3 sets - 10 reps - Sit to Stand with Counter Support  - 2 x daily - 5-7 x weekly - 2 sets - 5-10 reps - Standing Tandem Balance with Counter Support  - 1 x daily - 7 x weekly - 2 sets - 1 reps - 30 sec/leg hold   GOALS: Goals reviewed with patient? Yes  SHORT TERM GOALS: Target date: 01/23/2024     Patient will be independent in home exercise program to improve strength/mobility for better functional independence with ADLs. Baseline: Goal status: INITIAL   LONG TERM GOALS: Target date: 03/04/2024     1.  Patient (> 1 years old) will complete five times sit to stand test in < 15 seconds indicating an increased LE strength and improved balance. Baseline: 16 sec use of BUE, very fatiguing  Goal status: INITIAL  3.  Patient will increase Berg Balance score to 56/56 to demonstrate decreased fall risk during functional activities Baseline: 55/56 Goal status: INITIAL/REVISED  4.   Patient will increase 10 meter walk test to >1.65m/s as to improve gait speed for better community ambulation and to reduce fall risk. Baseline: 0.8 m/s Goal status: INITIAL  5.  Patient will increase R proximal LE strength to at least 4+/5 on MMT for improved gait ability Baseline: RLE grossly 4/5 proximal Goal status: INITIAL  6.  Patient will increase six minute walk test distance to >1000 for progression to community ambulator and improve gait ability  Baseline: 754 ft no AD  Goal status: INITIAL   7. Patient will increase ABC scale score >80% to demonstrate better functional mobility and better confidence with ADLs.   Baseline:  Goal status: INITIAL   ASSESSMENT:  CLINICAL IMPRESSION: Pt completed BERG, scoring 55/56, where he had difficulty only with tandem, indicating pt needs higher level balance testing. Pt limited on by fatigue, which is one of pt's most significant impairments. HEP initiated to address LE strength/endurance and balance in narrow positions (tandem). The pt will benefit from further skilled PT to address deficits in order to improve strength, endurance, balance, and functional mobility to increase QOL and decrease fall risk.  OBJECTIVE IMPAIRMENTS: Abnormal gait, decreased activity tolerance, decreased balance, decreased endurance, decreased mobility, difficulty walking, decreased strength, increased edema, improper body mechanics, postural dysfunction, and pain.   ACTIVITY LIMITATIONS: lifting, squatting, transfers, and locomotion level  PARTICIPATION LIMITATIONS: cleaning, shopping, community activity, and yard work  PERSONAL FACTORS: Age and 3+ comorbidities: PMH significant for CA, mets to liver, HF, hx of 2 knee replacements (years ago), a-fib, AAA, basal cell carcinoma, chronic kidney disease, diverticulitis, HTN, melanoma, OA, sleep apnea, vitamin b12 deficiency, chemo 07/2023  are also affecting patient's functional outcome.   REHAB POTENTIAL:  Good  CLINICAL DECISION MAKING: Evolving/moderate complexity  EVALUATION COMPLEXITY: Moderate  PLAN:  PT FREQUENCY: 1-2x/week  PT DURATION: 12 weeks  PLANNED INTERVENTIONS: 97164- PT Re-evaluation, 97110-Therapeutic exercises, 97530- Therapeutic activity, 97112- Neuromuscular re-education, 97535- Self Care, [id number]- Manual therapy, [phone]- Gait training, [phone]- Orthotic Fit/training, Patient/Family education, Balance training, Stair training, Joint mobilization, Spinal mobilization, Vestibular training, DME instructions, Cryotherapy, and Moist heat  PLAN FOR NEXT SESSION: endurance/cardio, strength, higher level balance   Darryle JONELLE Patten, PT 12/18/2023, 7:07 PM

## 2023-12-20 ENCOUNTER — Ambulatory Visit: Payer: Medicare Other

## 2023-12-20 DIAGNOSIS — M6281 Muscle weakness (generalized): Secondary | ICD-10-CM | POA: Diagnosis not present

## 2023-12-20 DIAGNOSIS — R262 Difficulty in walking, not elsewhere classified: Secondary | ICD-10-CM

## 2023-12-20 DIAGNOSIS — R2681 Unsteadiness on feet: Secondary | ICD-10-CM

## 2023-12-20 NOTE — Therapy (Signed)
 OUTPATIENT PHYSICAL THERAPY NEURO TREATMENT   Patient Name: Alexander Howe MRN: [id number] DOB:1946-11-11, 78 y.o., male Today's Date: 12/20/2023   PCP: Fernande Ophelia JINNY DOUGLAS, MD  REFERRING PROVIDER:  Fernande Ophelia JINNY DOUGLAS, MD     END OF SESSION:  PT End of Session - 12/20/23 1102     Visit Number 3    Number of Visits 25    Date for PT Re-Evaluation 03/05/24    PT Start Time 1105    PT Stop Time 1146    PT Time Calculation (min) 41 min    Equipment Utilized During Treatment Gait belt    Activity Tolerance Patient tolerated treatment well;Patient limited by fatigue    Behavior During Therapy WFL for tasks assessed/performed              Past Medical History:  Diagnosis Date   AAA (abdominal aortic aneurysm) without rupture (HCC)    Abdominal aneurysm (HCC)    3.5 cm   Actinic keratosis 08/26/2020   right lateral chest   Anxiety    Atrial fibrillation (HCC) 05/30/2023   Basal cell carcinoma 09/18/2018   left upper back/one excised, one superficial   Basal cell carcinoma 11/28/2018   left post shoulder   Basal cell carcinoma 08/26/2020   left medial pretibial, EDC 10/2020   Basal cell carcinoma 03/15/2022   right neck, EDC   Basal cell carcinoma (BCC) 08/31/2022   Posterior base of neck superficial. 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   right lateral chest, EDC 12/09/20   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(HCC)    Venous stasis    Venous stasis    Vitamin B 12 deficiency    Past Surgical History:  Procedure Laterality Date   BIOPSY  05/30/2023   Procedure: BIOPSY;  Surgeon:  Avram Lupita BRAVO, MD;  Location: [facility] ENDOSCOPY;  Service: Gastroenterology;;   COLONOSCOPY W/ POLYPECTOMY     COLONOSCOPY WITH PROPOFOL  N/A 04/05/2018   Procedure: COLONOSCOPY WITH PROPOFOL ;  Surgeon: Viktoria Lamar DASEN, MD;  Location: [facility] ENDOSCOPY;  Service: Endoscopy;  Laterality: N/A;   COLOSTOMY     COLOSTOMY REVERSAL     CYST REMOVAL LEG     ENDOSCOPIC RETROGRADE CHOLANGIOPANCREATOGRAPHY (ERCP) WITH PROPOFOL  N/A 05/30/2023   Procedure: ENDOSCOPIC RETROGRADE CHOLANGIOPANCREATOGRAPHY (ERCP) WITH PROPOFOL ;  Surgeon: Avram Lupita BRAVO, MD;  Location: [facility] ENDOSCOPY;  Service: Gastroenterology;  Laterality: N/A;   KNEE ARTHROPLASTY Right 12/27/2015   Procedure: COMPUTER ASSISTED TOTAL KNEE ARTHROPLASTY;  Surgeon: Lynwood SHAUNNA Hue, MD;  Location: [facility] ORS;  Service: Orthopedics;  Laterality: Right;   KNEE ARTHROSCOPY Left    LIPOMA EXCISION     right thigh   LITHOTRIPSY     PANCREATIC STENT PLACEMENT  05/30/2023   Procedure: PANCREATIC STENT PLACEMENT;  Surgeon: Avram Lupita BRAVO, MD;  Location: [facility] ENDOSCOPY;  Service: Gastroenterology;;   TOTAL KNEE ARTHROPLASTY Left 10/16/2012   Patient Active Problem List   Diagnosis Date Noted  Protein-calorie malnutrition, severe 06/02/2023   Calculus of GB and bile duct w acute cholecyst w/o obst 06/02/2023   Acute pancreatitis without infection or necrosis 06/01/2023   Post-ERCP acute pancreatitis 05/31/2023   Choledocholithiasis 05/31/2023   Gastritis and gastroduodenitis 05/30/2023   Atrial fibrillation (HCC) 05/30/2023   Elevated bilirubin 05/29/2023   Generalized abdominal pain 05/29/2023   Cholelithiasis with choledocholithiasis 05/29/2023   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   Elevated LFTs 05/28/2023   OSA on CPAP 05/28/2023   Ascending aortic aneurysm (HCC) 07/26/2020   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   S/P total knee arthroplasty 12/27/2015   Major depressive disorder with single episode, in full remission (HCC) 12/23/2015    Anxiety 11/16/2015   Benign essential hypertension 11/16/2015   Depression 11/16/2015   Hypersomnia with sleep apnea 11/16/2015   Osteoarthritis 11/16/2015   Pure hypercholesterolemia 11/16/2015   Abdominal aortic aneurysm without rupture (HCC) 12/23/2014   Chronic kidney disease, stage 3 unspecified (HCC) 12/17/2014    ONSET DATE: May 27 2023  REFERRING DIAG: E44.1 (ICD-10-CM) - Mild protein-calorie malnutrition   THERAPY DIAG:  Muscle weakness (generalized)  Difficulty in walking, not elsewhere classified  Unsteadiness on feet  Rationale for Evaluation and Treatment: Rehabilitation  SUBJECTIVE:                                                                                                                                                                                             SUBJECTIVE STATEMENT: Pt is fatigued. Reports did HEP. No other changes.  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 Pt reports gallbladder attack July 14th 2024, went to ED, reports they found a stone blockage. Pt then transferred to [facility] where ERCP was performed to remove stones of common bile duct. This was complicated by pancreatitis, heart failure and new onset of a-fib. He reports MRI taken at time where they found cancer in his liver. Pt has undergone 8 sessions of chemo, he also has a cholecystostomy tube that rests on his R side and down RLE. Pt reports future plans for procedure to cut off blood supply to R side of his liver and to remove gallbladder. Pt also with recent R portal vein, R hepatic vein embolization 12/04/23.  Recommendation for PT came following chemical stress test, where he reports physician wants him to get stronger before future procedures/surgeries. He has a follow-up in about 4 weeks. Pt notes decreased gait speed, cautious with  balance, and decreased strength.  No falls. He has constant pain on his R side he reports is due to gallbladder. He has a treadmill at home that he has been using and he likes to walk his dog. 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   PMH significant for CA, mets to liver, HF, hx of 2 knee replacements (years ago), a-fib, AAA, basal cell carcinoma, chronic kidney disease, diverticulitis, HTN, melanoma, OA, sleep apnea, vitamin b12 deficiency, chemo 07/2023, 10/2023 cholecystostomy tube placement, 12/04/2023 R portal vein, R hepatic vein emobolization, please refer to chart for full details.    PAIN:  Are you having pain? R side pain, reports due to gallbladder pain, cholecystostomy tube placement in this region  PRECAUTIONS: Fall and Other:    cholecystostomy tube; pt with ascending thoracic aneurism under surveillance,  advised by his physician to progress PT slowly ;  RED FLAGS: Abdominal aneurysm: Yes: says has been diagnosed with 3, being followed for this     WEIGHT BEARING RESTRICTIONS: No  FALLS: Has patient fallen in last 6 months? No  LIVING ENVIRONMENT: Lives with: lives with their spouse and and dog  Lives in: House/apartment Stairs: Yes: External: 2 steps; on left going up Has following equipment at home:  reports able to sit down in his shower, has chair  PLOF: Independent  PATIENT GOALS: improve strength  OBJECTIVE:  Note: Objective measures were completed at Evaluation unless otherwise noted.  DIAGNOSTIC FINDINGS:  Via chart PET myocardial perfusion stress and rest 11/30/2023    FINAL COMMENTS   Non-diagnostic CT obtained for attenuation correction: Coronary artery calcifications are present. Aortic atherosclerosis.   Conclusions:.   1. Myocardial perfusion is abnormal with evidence of ischemia  in the LCX.   2. Global/regional myocardial blood flow reserve is abnormal. There is evidence of a flow limiting stenosis in the LCX  and   coronary microvascular dysfunction.   3. Normal left ventricular systolic function.      Electronically Reviewed by: Ezella Agent   (Electronically Signed)   Final Date: 30 November 2023 17:03   MRI abdomen 11/18/2023  Impression:  Impression:  1. Similar size of the enhancing hepatic segment 7 lesion measuring up to  4.5 cm, biopsy-proven adenocarcinoma. No other suspicious hepatic lesions  in the liver.  2.  Decompressed gallbladder with interval removal of the cholecystostomy  tube. Small fluid collection adjacent to/within the gallbladder neck,  possibly an intramural abscess.  3.  Previously described indeterminant 1.1 cm right renal lesion is  compatible with a simple cyst.   Electronically Reviewed by:  Devin Cao, MD, [facility]  Electronically Reviewed on:  11/19/2023 10:21 AM   I have reviewed the images and concur with the above findings.   Electronically Signed by:  Toya Irving, MD, [facility]  Electronically Signed on:  11/19/2023 10:46 AM    Extensive imagine history, please refer to chart for full details   COGNITION: Overall cognitive status: Within functional limits for tasks assessed   SENSATION: WNL BLE   COORDINATION: Rapid alt UE and LE WNL BLE and BUE Chin to target WNL Heel>shin WNL   EDEMA:  He reports he has some ankle swelling, reports onset since this all began    POSTURE:  slight fwd posture, rounded shoulders   LOWER EXTREMITY MMT:   LLE grossly 4+/5 RLE grossly 4/5 proximal, 4+/5 distal   GAIT: Gait pattern:  Decreased gait speed, observed decrease in step length, decreased trunk rotation Distance walked: ,  clinic distances    FUNCTIONAL TESTS:   : 0.8 m/s  5xSTS:  16 sec use of BUE   TUG: 8.25 sec   Berg: deferred   : deferred   PATIENT SURVEYS:  deferred                                                                                                                               TREATMENT DATE: 12/20/23  NMR: Tandem stance 2x30 sec each LE -PT provided education areas of progress  TE: Completed prior to other interventions Nustep for endurance/cardio performance Lvl 1 x 1 min Lvl 2 x 1 min Lvl 3 x 1 min Lvl 2 x 1 mn Lvl 1 x 1 min SPM 50s-60s PT adjusts intensity throughout and monitors pt for response  Completed following other interventions: Nustep lvl 1-2-3 x 4 min SPM 50s-60s T adjusts intensity throughout and monitors pt for response  Seated LAQ 10x each LE  Seated march 10x each LE   Seated heel raise 10x bilat LE   Seated toe raise 10x bilat LE   Seated march 10x each LE  Seated heel raise 10x bliat LE  Seated toe raise 10x bilat LE   RTB hamstring curls  2x10 each LE - fairly easy   RTB seated LTL step outs 2x20 alt LE   TA: STS 1x5  hands-free. Pt rates medium.  Ambulation focus on endurance and cardio performance with SBA-CGA x 148 ft - pt rates medium. Rest break taken -repeats additoinal 148 ft and exhibits increased step-length  STS 1x5 hands-free   Rest breaks provided throughout    PATIENT EDUCATION: Education details: assessment findings, HEP Person educated: Patient Education method: Explanation Education comprehension: verbalized understanding  HOME EXERCISE PROGRAM: Access Code: [email or web address] URL: [email or web address] Date: 12/18/2023 Prepared by: Darryle Patten  Exercises - Seated March  - 2 x daily - 5-7 x weekly - 3 sets - 10 reps - Sit to Stand with Counter Support  - 2 x daily - 5-7 x weekly - 2 sets - 5-10 reps - Standing Tandem Balance with Counter Support  - 1 x daily - 7 x weekly - 2 sets - 1 reps - 30 sec/leg hold   GOALS: Goals reviewed with patient? Yes  SHORT TERM GOALS: Target date: 01/23/2024     Patient will be independent in home exercise program to improve strength/mobility for better functional independence with ADLs. Baseline: Goal status: INITIAL   LONG TERM  GOALS: Target date: 03/04/2024     1.  Patient (> 12 years old) will complete five times sit to stand test in < 15 seconds indicating an increased LE strength and improved balance. Baseline: 16 sec use of BUE, very fatiguing  Goal status: INITIAL  3.  Patient will increase Berg Balance score to 56/56 to demonstrate decreased fall risk during functional activities Baseline: 55/56 Goal status: INITIAL/REVISED  4.  Patient will increase 10 meter walk test to >  1.71m/s as to improve gait speed for better community ambulation and to reduce fall risk. Baseline: 0.8 m/s Goal status: INITIAL  5.  Patient will increase R proximal LE strength to at least 4+/5 on MMT for improved gait ability Baseline: RLE grossly 4/5 proximal Goal status: INITIAL  6.  Patient will increase six minute walk test distance to >1000 for progression to community ambulator and improve gait ability  Baseline: 754 ft no AD Goal status: INITIAL   7. Patient will increase ABC scale score >80% to demonstrate better functional mobility and better confidence with ADLs.   Baseline:  Goal status: INITIAL   ASSESSMENT:  CLINICAL IMPRESSION: At start of session pt observed to be fatigued. This improved with activity with observed improvement in step-length with gait. Pt also exhibits improvement with tandem stance and STS technique. Rest breaks provided as needed to reduce fatigue. The pt will benefit from further skilled PT to address deficits in order to improve strength, endurance, balance, and functional mobility to increase QOL and decrease fall risk.  OBJECTIVE IMPAIRMENTS: Abnormal gait, decreased activity tolerance, decreased balance, decreased endurance, decreased mobility, difficulty walking, decreased strength, increased edema, improper body mechanics, postural dysfunction, and pain.   ACTIVITY LIMITATIONS: lifting, squatting, transfers, and locomotion level  PARTICIPATION LIMITATIONS: cleaning, shopping,  community activity, and yard work  PERSONAL FACTORS: Age and 3+ comorbidities: PMH significant for CA, mets to liver, HF, hx of 2 knee replacements (years ago), a-fib, AAA, basal cell carcinoma, chronic kidney disease, diverticulitis, HTN, melanoma, OA, sleep apnea, vitamin b12 deficiency, chemo 07/2023  are also affecting patient's functional outcome.   REHAB POTENTIAL: Good  CLINICAL DECISION MAKING: Evolving/moderate complexity  EVALUATION COMPLEXITY: Moderate  PLAN:  PT FREQUENCY: 1-2x/week  PT DURATION: 12 weeks  PLANNED INTERVENTIONS: [id number]- PT Re-evaluation, 97110-Therapeutic exercises, 97530- Therapeutic activity, 97112- Neuromuscular re-education, 97535- Self Care, [id number]- Manual therapy, [phone]- Gait training, [phone]- Orthotic Fit/training, Patient/Family education, Balance training, Stair training, Joint mobilization, Spinal mobilization, Vestibular training, DME instructions, Cryotherapy, and Moist heat  PLAN FOR NEXT SESSION: endurance/cardio, strength, higher level balance   Darryle JONELLE Patten, PT 12/20/2023, 5:14 PM

## 2023-12-24 ENCOUNTER — Ambulatory Visit: Payer: Medicare Other

## 2023-12-24 DIAGNOSIS — M6281 Muscle weakness (generalized): Secondary | ICD-10-CM | POA: Diagnosis not present

## 2023-12-24 DIAGNOSIS — R262 Difficulty in walking, not elsewhere classified: Secondary | ICD-10-CM

## 2023-12-24 NOTE — Therapy (Signed)
 OUTPATIENT PHYSICAL THERAPY NEURO TREATMENT   Patient Name: Alexander Howe MRN: [id number] DOB:17-Mar-1946, 78 y.o., male Today's Date: 12/24/2023   PCP: Melchor Spoon, MD  REFERRING PROVIDER:  Melchor Spoon, MD     END OF SESSION:  PT End of Session - 12/24/23 1400     Visit Number 4    Number of Visits 25    Date for PT Re-Evaluation 03/05/24    PT Start Time 1401    PT Stop Time 1443    PT Time Calculation (min) 42 min    Equipment Utilized During Treatment Gait belt    Activity Tolerance Patient tolerated treatment well;Patient limited by fatigue    Behavior During Therapy WFL for tasks assessed/performed              Past Medical History:  Diagnosis Date   AAA (abdominal aortic aneurysm) without rupture (HCC)    Abdominal aneurysm (HCC)    3.5 cm   Actinic keratosis 08/26/2020   right lateral chest   Anxiety    Atrial fibrillation (HCC) 05/30/2023   Basal cell carcinoma 09/18/2018   left upper back/one excised, one superficial   Basal cell carcinoma 11/28/2018   left post shoulder   Basal cell carcinoma 08/26/2020   left medial pretibial, EDC 10/2020   Basal cell carcinoma 03/15/2022   right neck, EDC   Basal cell carcinoma (BCC) 08/31/2022   Posterior base of neck superficial. 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   right lateral chest, EDC 12/09/20   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(HCC)    Venous stasis    Venous stasis    Vitamin B 12 deficiency    Past Surgical History:  Procedure Laterality Date   BIOPSY  05/30/2023   Procedure: BIOPSY;  Surgeon:  Kenney Peacemaker, MD;  Location: [facility] ENDOSCOPY;  Service: Gastroenterology;;   COLONOSCOPY W/ POLYPECTOMY     COLONOSCOPY WITH PROPOFOL  N/A 04/05/2018   Procedure: COLONOSCOPY WITH PROPOFOL ;  Surgeon: Cassie Click, MD;  Location: [facility] ENDOSCOPY;  Service: Endoscopy;  Laterality: N/A;   COLOSTOMY     COLOSTOMY REVERSAL     CYST REMOVAL LEG     ENDOSCOPIC RETROGRADE CHOLANGIOPANCREATOGRAPHY (ERCP) WITH PROPOFOL  N/A 05/30/2023   Procedure: ENDOSCOPIC RETROGRADE CHOLANGIOPANCREATOGRAPHY (ERCP) WITH PROPOFOL ;  Surgeon: Kenney Peacemaker, MD;  Location: [facility] ENDOSCOPY;  Service: Gastroenterology;  Laterality: N/A;   KNEE ARTHROPLASTY Right 12/27/2015   Procedure: COMPUTER ASSISTED TOTAL KNEE ARTHROPLASTY;  Surgeon: Arlyne Lame, MD;  Location: [facility] ORS;  Service: Orthopedics;  Laterality: Right;   KNEE ARTHROSCOPY Left    LIPOMA EXCISION     right thigh   LITHOTRIPSY     PANCREATIC STENT PLACEMENT  05/30/2023   Procedure: PANCREATIC STENT PLACEMENT;  Surgeon: Kenney Peacemaker, MD;  Location: [facility] ENDOSCOPY;  Service: Gastroenterology;;   TOTAL KNEE ARTHROPLASTY Left 10/16/2012   Patient Active Problem List   Diagnosis Date Noted  Protein-calorie malnutrition, severe 06/02/2023   Calculus of GB and bile duct w acute cholecyst w/o obst 06/02/2023   Acute pancreatitis without infection or necrosis 06/01/2023   Post-ERCP acute pancreatitis 05/31/2023   Choledocholithiasis 05/31/2023   Gastritis and gastroduodenitis 05/30/2023   Atrial fibrillation (HCC) 05/30/2023   Elevated bilirubin 05/29/2023   Generalized abdominal pain 05/29/2023   Cholelithiasis with choledocholithiasis 05/29/2023   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   Elevated LFTs 05/28/2023   OSA on CPAP 05/28/2023   Ascending aortic aneurysm (HCC) 07/26/2020   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   S/P total knee arthroplasty 12/27/2015   Major depressive disorder with single episode, in full remission (HCC) 12/23/2015    Anxiety 11/16/2015   Benign essential hypertension 11/16/2015   Depression 11/16/2015   Hypersomnia with sleep apnea 11/16/2015   Osteoarthritis 11/16/2015   Pure hypercholesterolemia 11/16/2015   Abdominal aortic aneurysm without rupture (HCC) 12/23/2014   Chronic kidney disease, stage 3 unspecified (HCC) 12/17/2014    ONSET DATE: May 27 2023  REFERRING DIAG: E44.1 (ICD-10-CM) - Mild protein-calorie malnutrition   THERAPY DIAG:  Muscle weakness (generalized)  Difficulty in walking, not elsewhere classified  Rationale for Evaluation and Treatment: Rehabilitation  SUBJECTIVE:                                                                                                                                                                                             SUBJECTIVE STATEMENT: Pt  was sore following last visit but feeling OK now.   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 Pt reports gallbladder attack July 14th 2024, went to ED, reports they found a "stone blockage." Pt then transferred to [facility] where ERCP was performed to remove stones of common bile duct. This was complicated by pancreatitis, heart failure and new onset of a-fib. He reports MRI taken at time where they found cancer in his liver. Pt has undergone 8 sessions of chemo, he also has a cholecystostomy tube that rests on his R side and down RLE. Pt reports future plans for procedure to "cut off blood supply" to R side of his liver and to remove gallbladder. Pt also with recent R portal vein, R hepatic vein embolization 12/04/23.  Recommendation for PT came following chemical stress test, where he reports physician wants him to get stronger before future procedures/surgeries. He has a follow-up in about 4 weeks. Pt notes decreased gait speed, cautious with balance, and decreased  strength. No  falls. He has constant pain on his R side he reports is due to gallbladder. He has a treadmill at home that he has been using and he likes to walk his dog. 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   PMH significant for CA, mets to liver, HF, hx of 2 knee replacements (years ago), a-fib, AAA, basal cell carcinoma, chronic kidney disease, diverticulitis, HTN, melanoma, OA, sleep apnea, vitamin b12 deficiency, chemo 07/2023, 10/2023 cholecystostomy tube placement, 12/04/2023 R portal vein, R hepatic vein emobolization, please refer to chart for full details.    PAIN:  Are you having pain? R side pain, reports due to gallbladder pain, cholecystostomy tube placement in this region  PRECAUTIONS: Fall and Other:    cholecystostomy tube; pt with ascending thoracic aneurism under surveillance,  advised by his physician to progress PT slowly ;  RED FLAGS: Abdominal aneurysm: Yes: says has been diagnosed with 3, being followed for this     WEIGHT BEARING RESTRICTIONS: No  FALLS: Has patient fallen in last 6 months? No  LIVING ENVIRONMENT: Lives with: lives with their spouse and and dog  Lives in: House/apartment Stairs: Yes: External: 2 steps; on left going up Has following equipment at home:  reports able to sit down in his shower, has chair  PLOF: Independent  PATIENT GOALS: improve strength  OBJECTIVE:  Note: Objective measures were completed at Evaluation unless otherwise noted.  DIAGNOSTIC FINDINGS:  Via chart PET myocardial perfusion stress and rest 11/30/2023 "   FINAL COMMENTS   Non-diagnostic CT obtained for attenuation correction: Coronary artery calcifications are present. Aortic atherosclerosis.   Conclusions:.   1. Myocardial perfusion is abnormal with evidence of ischemia  in the LCX.   2. Global/regional myocardial blood flow reserve is abnormal. There is evidence of a flow limiting stenosis in the LCX and   coronary  microvascular dysfunction.   3. Normal left ventricular systolic function.      Electronically Reviewed by: Alberta Hug   (Electronically Signed)   Final Date: 30 November 2023 17:03 "  MRI abdomen 11/18/2023 " Impression: " Impression:  1. Similar size of the enhancing hepatic segment 7 lesion measuring up to  4.5 cm, biopsy-proven adenocarcinoma. No other suspicious hepatic lesions  in the liver.  2.  Decompressed gallbladder with interval removal of the cholecystostomy  tube. Small fluid collection adjacent to/within the gallbladder neck,  possibly an intramural abscess.  3.  Previously described indeterminant 1.1 cm right renal lesion is  compatible with a simple cyst.   Electronically Reviewed by:  Devin Cao, MD, [facility]  Electronically Reviewed on:  11/19/2023 10:21 AM   I have reviewed the images and concur with the above findings.   Electronically Signed by:  Darnelle Elders, MD, [facility]  Electronically Signed on:  11/19/2023 10:46 AM "   Extensive imagine history, please refer to chart for full details   COGNITION: Overall cognitive status: Within functional limits for tasks assessed   SENSATION: WNL BLE   COORDINATION: Rapid alt UE and LE WNL BLE and BUE Chin to target WNL Heel>shin WNL   EDEMA:  He reports he has some ankle swelling, reports onset "since this all began"    POSTURE:  slight fwd posture, rounded shoulders   LOWER EXTREMITY MMT:   LLE grossly 4+/5 RLE grossly 4/5 proximal, 4+/5 distal   GAIT: Gait pattern:  Decreased gait speed, observed decrease in step length, decreased trunk rotation Distance walked: , clinic  distances    FUNCTIONAL TESTS:   : 0.8 m/s  5xSTS:  16 sec use of BUE   TUG: 8.25 sec   Berg: deferred   : deferred   PATIENT SURVEYS:  deferred                                                                                                                              TREATMENT DATE:  12/24/23  TE: Nustep for endurance/cardio performance Lvl 1 x 1 min Lvl 2 x 1 min Lvl 3 x 1 min Lvl 4 x 1 mn Lvl 1 x 1 min SPM 50s-60s PT adjusts intensity throughout and monitors pt for response*  With 1# aw each LE: -Seated LAQ 1x10, 1x6 each LE -Seated march 1x10, 1x6 each LE  -Seated heel raise 15x, 10x bilat LE  -Seated toe raise 15x, 10x bilat LE  -Ltl step-outs 10x  Without ankle weights: -Ltl step-outs 10x -LAQ 4x each LE  -seated march 4x each LE  TA: 1# aw each LE  Ambulation focus on endurance and cardio performance with SBA2 x 148 ft - pt rates medium. Rest break taken -repeats additoinal 2x148 ft without ankle weights. HR following last lap 98-101 bpm   -Ltl stepping in // bars 1x6 length of bars with 1# aws -Ltl stepping in // bars 1x6 length of bars   STS 1x5, 3x2  hands-free. Pt rates medium.  Rest breaks provided throughout    PATIENT EDUCATION: Education details: Pt educated throughout session about proper posture and technique with exercises. Improved exercise technique, movement at target joints, use of target muscles after min to mod verbal, visual, tactile cues. Person educated: Patient Education method: Explanation Education comprehension: verbalized understanding  HOME EXERCISE PROGRAM: Access Code: [email or web address] URL: [email or web address] Date: 12/18/2023 Prepared by: Aminta Kales  Exercises - Seated March  - 2 x daily - 5-7 x weekly - 3 sets - 10 reps - Sit to Stand with Counter Support  - 2 x daily - 5-7 x weekly - 2 sets - 5-10 reps - Standing Tandem Balance with Counter Support  - 1 x daily - 7 x weekly - 2 sets - 1 reps - 30 sec/leg hold   GOALS: Goals reviewed with patient? Yes  SHORT TERM GOALS: Target date: 01/23/2024     Patient will be independent in home exercise program to improve strength/mobility for better functional independence with ADLs. Baseline: Goal status: INITIAL   LONG TERM GOALS: Target date:  03/04/2024     1.  Patient (> 77 years old) will complete five times sit to stand test in < 15 seconds indicating an increased LE strength and improved balance. Baseline: 16 sec use of BUE, very fatiguing  Goal status: INITIAL  3.  Patient will increase Berg Balance score to 56/56 to demonstrate decreased fall risk during functional activities Baseline: 55/56 Goal status: INITIAL/REVISED  4.  Patient will increase 10 meter walk test to >1.55m/s as  to improve gait speed for better community ambulation and to reduce fall risk. Baseline: 0.8 m/s Goal status: INITIAL  5.  Patient will increase R proximal LE strength to at least 4+/5 on MMT for improved gait ability Baseline: RLE grossly 4/5 proximal Goal status: INITIAL  6.  Patient will increase six minute walk test distance to >1000 for progression to community ambulator and improve gait ability  Baseline: 754 ft no AD Goal status: INITIAL   7. Patient will increase ABC scale score >80% to demonstrate better functional mobility and better confidence with ADLs.   Baseline:  Goal status: INITIAL   ASSESSMENT:  CLINICAL IMPRESSION: Pt able to progress interventions today with use of 1# aw and with greater total distance of ambulation. PT still provides frequent rest-breaks throughout to prevent excessive fatigue.The pt will benefit from further skilled PT to address deficits in order to improve strength, endurance, balance, and functional mobility to increase QOL and decrease fall risk.  OBJECTIVE IMPAIRMENTS: Abnormal gait, decreased activity tolerance, decreased balance, decreased endurance, decreased mobility, difficulty walking, decreased strength, increased edema, improper body mechanics, postural dysfunction, and pain.   ACTIVITY LIMITATIONS: lifting, squatting, transfers, and locomotion level  PARTICIPATION LIMITATIONS: cleaning, shopping, community activity, and yard work  PERSONAL FACTORS: Age and 3+ comorbidities: PMH  significant for CA, mets to liver, HF, hx of 2 knee replacements (years ago), a-fib, AAA, basal cell carcinoma, chronic kidney disease, diverticulitis, HTN, melanoma, OA, sleep apnea, vitamin b12 deficiency, chemo 07/2023  are also affecting patient's functional outcome.   REHAB POTENTIAL: Good  CLINICAL DECISION MAKING: Evolving/moderate complexity  EVALUATION COMPLEXITY: Moderate  PLAN:  PT FREQUENCY: 1-2x/week  PT DURATION: 12 weeks  PLANNED INTERVENTIONS: 97164- PT Re-evaluation, 97110-Therapeutic exercises, 97530- Therapeutic activity, 97112- Neuromuscular re-education, 97535- Self Care, 82956- Manual therapy, [phone]- Gait training, [phone]- Orthotic Fit/training, Patient/Family education, Balance training, Stair training, Joint mobilization, Spinal mobilization, Vestibular training, DME instructions, Cryotherapy, and Moist heat  PLAN FOR NEXT SESSION: endurance/cardio, strength, higher level balance   Samie Crews, PT 12/24/2023, 4:43 PM

## 2023-12-26 ENCOUNTER — Ambulatory Visit: Payer: Medicare Other

## 2023-12-26 DIAGNOSIS — R262 Difficulty in walking, not elsewhere classified: Secondary | ICD-10-CM

## 2023-12-26 DIAGNOSIS — M6281 Muscle weakness (generalized): Secondary | ICD-10-CM | POA: Diagnosis not present

## 2023-12-26 NOTE — Therapy (Signed)
 OUTPATIENT PHYSICAL THERAPY NEURO TREATMENT   Patient Name: Alexander Howe MRN: [id number] DOB:16-Dec-1945, 78 y.o., male Today's Date: 12/26/2023   PCP: [name], MD  REFERRING PROVIDER:  [name], MD     END OF SESSION:  PT End of Session - 12/26/23 1402     Visit Number 5    Number of Visits 25    Date for PT Re-Evaluation 03/05/24    PT Start Time 1403    PT Stop Time 1445    PT Time Calculation (min) 42 min    Equipment Utilized During Treatment Gait belt    Activity Tolerance Patient tolerated treatment well;Patient limited by fatigue    Behavior During Therapy WFL for tasks assessed/performed              Past Medical History:  Diagnosis Date   AAA (abdominal aortic aneurysm) without rupture (HCC)    Abdominal aneurysm (HCC)    3.5 cm   Actinic keratosis 08/26/2020   right lateral chest   Anxiety    Atrial fibrillation (HCC) 05/30/2023   Basal cell carcinoma 09/18/2018   left upper back/one excised, one superficial   Basal cell carcinoma 11/28/2018   left post shoulder   Basal cell carcinoma 08/26/2020   left medial pretibial, EDC 10/2020   Basal cell carcinoma 03/15/2022   right neck, EDC   Basal cell carcinoma (BCC) 08/31/2022   Posterior base of neck superficial. 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   right lateral chest, EDC 12/09/20   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(HCC)    Venous stasis    Venous stasis    Vitamin B 12 deficiency    Past Surgical History:  Procedure Laterality Date   BIOPSY  05/30/2023   Procedure: BIOPSY;  Surgeon:  Iva Boop, MD;  Location: [facility] ENDOSCOPY;  Service: Gastroenterology;;   COLONOSCOPY W/ POLYPECTOMY     COLONOSCOPY WITH PROPOFOL N/A 04/05/2018   Procedure: COLONOSCOPY WITH PROPOFOL;  Surgeon: Scot Jun, MD;  Location: [facility] ENDOSCOPY;  Service: Endoscopy;  Laterality: N/A;   COLOSTOMY     COLOSTOMY REVERSAL     CYST REMOVAL LEG     ENDOSCOPIC RETROGRADE CHOLANGIOPANCREATOGRAPHY (ERCP) WITH PROPOFOL N/A 05/30/2023   Procedure: ENDOSCOPIC RETROGRADE CHOLANGIOPANCREATOGRAPHY (ERCP) WITH PROPOFOL;  Surgeon: Iva Boop, MD;  Location: [facility] ENDOSCOPY;  Service: Gastroenterology;  Laterality: N/A;   KNEE ARTHROPLASTY Right 12/27/2015   Procedure: COMPUTER ASSISTED TOTAL KNEE ARTHROPLASTY;  Surgeon: Donato Heinz, MD;  Location: [facility] ORS;  Service: Orthopedics;  Laterality: Right;   KNEE ARTHROSCOPY Left    LIPOMA EXCISION     right thigh   LITHOTRIPSY     PANCREATIC STENT PLACEMENT  05/30/2023   Procedure: PANCREATIC STENT PLACEMENT;  Surgeon: Iva Boop, MD;  Location: [facility] ENDOSCOPY;  Service: Gastroenterology;;   TOTAL KNEE ARTHROPLASTY Left 10/16/2012   Patient Active Problem List   Diagnosis Date Noted  Protein-calorie malnutrition, Howe 06/02/2023   Calculus of GB and bile duct w acute cholecyst w/o obst 06/02/2023   Acute pancreatitis without infection or necrosis 06/01/2023   Post-ERCP acute pancreatitis 05/31/2023   Choledocholithiasis 05/31/2023   Gastritis and gastroduodenitis 05/30/2023   Atrial fibrillation (HCC) 05/30/2023   Elevated bilirubin 05/29/2023   Generalized abdominal pain 05/29/2023   Cholelithiasis with choledocholithiasis 05/29/2023   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   Elevated LFTs 05/28/2023   OSA on CPAP 05/28/2023   Ascending aortic aneurysm (HCC) 07/26/2020   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   S/P total knee arthroplasty 12/27/2015   Major depressive disorder with single episode, in full remission (HCC) 12/23/2015    Anxiety 11/16/2015   Benign essential hypertension 11/16/2015   Depression 11/16/2015   Hypersomnia with sleep apnea 11/16/2015   Osteoarthritis 11/16/2015   Pure hypercholesterolemia 11/16/2015   Abdominal aortic aneurysm without rupture (HCC) 12/23/2014   Chronic kidney disease, stage 3 unspecified (HCC) 12/17/2014    ONSET DATE: May 27 2023  REFERRING DIAG: E44.1 (ICD-10-CM) - Mild protein-calorie malnutrition   THERAPY DIAG:  Muscle weakness (generalized)  Difficulty in walking, not elsewhere classified  Rationale for Evaluation and Treatment: Rehabilitation  SUBJECTIVE:                                                                                                                                                                                             SUBJECTIVE STATEMENT: Pt says he was not too sore following last visit. Rates his current energy level as moderate.  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 Pt reports gallbladder attack July 14th 2024, went to ED, reports they found a "stone blockage." Pt then transferred to [facility] where ERCP was performed to remove stones of common bile duct. This was complicated by pancreatitis, heart failure and new onset of a-fib. He reports MRI taken at time where they found cancer in his liver. Pt has undergone 8 sessions of chemo, he also has a cholecystostomy tube that rests on his R side and down RLE. Pt reports future plans for procedure to "cut off blood supply" to R side of his liver and to remove gallbladder. Pt also with recent R portal vein, R hepatic vein embolization 12/04/23.  Recommendation for PT came following chemical stress test, where he reports physician wants him to get stronger before future procedures/surgeries. He has a follow-up in about 4 weeks. Pt notes decreased gait  speed, cautious with  balance, and decreased strength. No falls. He has constant pain on his R side he reports is due to gallbladder. He has a treadmill at home that he has been using and he likes to walk his dog. 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   PMH significant for CA, mets to liver, HF, hx of 2 knee replacements (years ago), a-fib, AAA, basal cell carcinoma, chronic kidney disease, diverticulitis, HTN, melanoma, OA, sleep apnea, vitamin b12 deficiency, chemo 07/2023, 10/2023 cholecystostomy tube placement, 12/04/2023 R portal vein, R hepatic vein emobolization, please refer to chart for full details.    PAIN:  Are you having pain? R side pain, reports due to gallbladder pain, cholecystostomy tube placement in this region  PRECAUTIONS: Fall and Other:    cholecystostomy tube; pt with ascending thoracic aneurism under surveillance,  advised by his physician to progress PT slowly ;  RED FLAGS: Abdominal aneurysm: Yes: says has been diagnosed with 3, being followed for this     WEIGHT BEARING RESTRICTIONS: No  FALLS: Has patient fallen in last 6 months? No  LIVING ENVIRONMENT: Lives with: lives with their spouse and and dog  Lives in: House/apartment Stairs: Yes: External: 2 steps; on left going up Has following equipment at home:  reports able to sit down in his shower, has chair  PLOF: Independent  PATIENT GOALS: improve strength  OBJECTIVE:  Note: Objective measures were completed at Evaluation unless otherwise noted.  DIAGNOSTIC FINDINGS:  Via chart PET myocardial perfusion stress and rest 11/30/2023 "   FINAL COMMENTS   Non-diagnostic CT obtained for attenuation correction: Coronary artery calcifications are present. Aortic atherosclerosis.   Conclusions:.   1. Myocardial perfusion is abnormal with evidence of ischemia  in the LCX.   2. Global/regional myocardial blood flow reserve is abnormal. There is evidence of a flow limiting  stenosis in the LCX and   coronary microvascular dysfunction.   3. Normal left ventricular systolic function.      Electronically Reviewed by: Pamala Hurry   (Electronically Signed)   Final Date: 30 November 2023 17:03 "  MRI abdomen 11/18/2023 " Impression: " Impression:  1. Similar size of the enhancing hepatic segment 7 lesion measuring up to  4.5 cm, biopsy-proven adenocarcinoma. No other suspicious hepatic lesions  in the liver.  2.  Decompressed gallbladder with interval removal of the cholecystostomy  tube. Small fluid collection adjacent to/within the gallbladder neck,  possibly an intramural abscess.  3.  Previously described indeterminant 1.1 cm right renal lesion is  compatible with a simple cyst.   Electronically Reviewed by:  Consuelo Pandy, MD, [facility]  Electronically Reviewed on:  11/19/2023 10:21 AM   I have reviewed the images and concur with the above findings.   Electronically Signed by:  Gypsy Decant, MD, [facility]  Electronically Signed on:  11/19/2023 10:46 AM "   Extensive imagine history, please refer to chart for full details   COGNITION: Overall cognitive status: Within functional limits for tasks assessed   SENSATION: WNL BLE   COORDINATION: Rapid alt UE and LE WNL BLE and BUE Chin to target WNL Heel>shin WNL   EDEMA:  He reports he has some ankle swelling, reports onset "since this all began"    POSTURE:  slight fwd posture, rounded shoulders   LOWER EXTREMITY MMT:   LLE grossly 4+/5 RLE grossly 4/5 proximal, 4+/5 distal   GAIT: Gait pattern:  Decreased gait speed, observed decrease in step length, decreased trunk  rotation Distance walked: , clinic distances    FUNCTIONAL TESTS:   : 0.8 m/s  5xSTS:  16 sec use of BUE   TUG: 8.25 sec   Berg: deferred   : deferred   PATIENT SURVEYS:  deferred                                                                                                                               TREATMENT DATE: 12/26/23  TE: Nustep for endurance/cardio performance Lvl 1 x 1 min Lvl 2 x 1 min Lvl 3 x 1 min Lvl 5 x 1 mn 30 sec Lvl 2 x 1 min SPM 50s-60s PT adjusts intensity throughout and monitors pt for response*  With 1# aw each LE: -Seated LAQ 1x12, 1x6 each LE -Seated heel raise 15x, 12x bilat LE  -Standing march 1x8, 1x6 each LE  -Seated toe raise 15x, 12x bilat LE  -Ltl step-outs 10x  Without ankle weights: -Ltl step-outs 10x -LAQ 4x each LE  -seated march 6x each LE -Seated heel raise 12x bilat LE    TA: 1# aw each LE  Ambulation focus on endurance and cardio performance with SBA x296 ft - pt rates medium. Rest break taken -additional x296 ft  HR following last lap 107 bpm immediately following exercise  -additional 148 ft   -Ltl stepping in // bars 3x6 length of bars - rates moderate   STS 1x5, 1x2  hands-free. Pt rates medium.  Rest breaks provided throughout    PATIENT EDUCATION: Education details: Pt educated throughout session about proper posture and technique with exercises. Improved exercise technique, movement at target joints, use of target muscles after min to mod verbal, visual, tactile cues. Person educated: Patient Education method: Explanation Education comprehension: verbalized understanding  HOME EXERCISE PROGRAM: Access Code: [email or web address] URL: [email or web address] Date: 12/18/2023 Prepared by: Temple Pacini  Exercises - Seated March  - 2 x daily - 5-7 x weekly - 3 sets - 10 reps - Sit to Stand with Counter Support  - 2 x daily - 5-7 x weekly - 2 sets - 5-10 reps - Standing Tandem Balance with Counter Support  - 1 x daily - 7 x weekly - 2 sets - 1 reps - 30 sec/leg hold   GOALS: Goals reviewed with patient? Yes  SHORT TERM GOALS: Target date: 01/23/2024     Patient will be independent in home exercise program to improve strength/mobility for better functional independence with ADLs. Baseline: Goal  status: INITIAL   LONG TERM GOALS: Target date: 03/04/2024     1.  Patient (> 88 years old) will complete five times sit to stand test in < 15 seconds indicating an increased LE strength and improved balance. Baseline: 16 sec use of BUE, very fatiguing  Goal status: INITIAL  3.  Patient will increase Berg Balance score to 56/56 to demonstrate decreased fall risk during functional activities Baseline: 55/56 Goal status: INITIAL/REVISED  4.  Patient  will increase 10 meter walk test to >1.39m/s as to improve gait speed for better community ambulation and to reduce fall risk. Baseline: 0.8 m/s Goal status: INITIAL  5.  Patient will increase R proximal LE strength to at least 4+/5 on MMT for improved gait ability Baseline: RLE grossly 4/5 proximal Goal status: INITIAL  6.  Patient will increase six minute walk test distance to >1000 for progression to community ambulator and improve gait ability  Baseline: 754 ft no AD Goal status: INITIAL   7. Patient will increase ABC scale score >80% to demonstrate better functional mobility and better confidence with ADLs.   Baseline:  Goal status: INITIAL   ASSESSMENT:  CLINICAL IMPRESSION: Pt continues to make gains with endurance. He struggles some with maintaining upright posture during gait and is able to intermittently correct. Initiated more standing therex, but this is challenging.The pt will benefit from further skilled PT to address deficits in order to improve strength, endurance, balance, and functional mobility to increase QOL and decrease fall risk.  OBJECTIVE IMPAIRMENTS: Abnormal gait, decreased activity tolerance, decreased balance, decreased endurance, decreased mobility, difficulty walking, decreased strength, increased edema, improper body mechanics, postural dysfunction, and pain.   ACTIVITY LIMITATIONS: lifting, squatting, transfers, and locomotion level  PARTICIPATION LIMITATIONS: cleaning, shopping, community activity,  and yard work  PERSONAL FACTORS: Age and 3+ comorbidities: PMH significant for CA, mets to liver, HF, hx of 2 knee replacements (years ago), a-fib, AAA, basal cell carcinoma, chronic kidney disease, diverticulitis, HTN, melanoma, OA, sleep apnea, vitamin b12 deficiency, chemo 07/2023  are also affecting patient's functional outcome.   REHAB POTENTIAL: Good  CLINICAL DECISION MAKING: Evolving/moderate complexity  EVALUATION COMPLEXITY: Moderate  PLAN:  PT FREQUENCY: 1-2x/week  PT DURATION: 12 weeks  PLANNED INTERVENTIONS: [id number]- PT Re-evaluation, 97110-Therapeutic exercises, 97530- Therapeutic activity, 97112- Neuromuscular re-education, 97535- Self Care, [id number]- Manual therapy, [phone]- Gait training, [phone]- Orthotic Fit/training, Patient/Family education, Balance training, Stair training, Joint mobilization, Spinal mobilization, Vestibular training, DME instructions, Cryotherapy, and Moist heat  PLAN FOR NEXT SESSION: endurance/cardio, strength, higher level balance   Baird Kay, PT 12/26/2023, 5:16 PM

## 2023-12-31 ENCOUNTER — Ambulatory Visit: Payer: Medicare Other

## 2023-12-31 DIAGNOSIS — M6281 Muscle weakness (generalized): Secondary | ICD-10-CM | POA: Diagnosis not present

## 2023-12-31 DIAGNOSIS — R262 Difficulty in walking, not elsewhere classified: Secondary | ICD-10-CM

## 2023-12-31 NOTE — Therapy (Signed)
 OUTPATIENT PHYSICAL THERAPY NEURO TREATMENT   Patient Name: Alexander Howe MRN: [id number] DOB:[birth year], 78 y.o., male Today's Date: 12/31/2023   PCP: [name], MD  REFERRING PROVIDER:  [name], MD     END OF SESSION:  PT End of Session - 12/31/23 1407     Visit Number 6    Number of Visits 25    Date for PT Re-Evaluation 03/05/24    PT Start Time 1404    PT Stop Time 1445    PT Time Calculation (min) 41 min    Equipment Utilized During Treatment Gait belt    Activity Tolerance Patient tolerated treatment well;Patient limited by fatigue    Behavior During Therapy WFL for tasks assessed/performed               Past Medical History:  Diagnosis Date   AAA (abdominal aortic aneurysm) without rupture (HCC)    Abdominal aneurysm (HCC)    3.5 cm   Actinic keratosis 08/26/2020   right lateral chest   Anxiety    Atrial fibrillation (HCC) 05/30/2023   Basal cell carcinoma 09/18/2018   left upper back/one excised, one superficial   Basal cell carcinoma 11/28/2018   left post shoulder   Basal cell carcinoma 08/26/2020   left medial pretibial, EDC 10/2020   Basal cell carcinoma 03/15/2022   right neck, EDC   Basal cell carcinoma (BCC) 08/31/2022   Posterior base of neck superficial. 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   right lateral chest, EDC 12/09/20   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(HCC)    Venous stasis    Venous stasis    Vitamin B 12 deficiency    Past Surgical History:  Procedure Laterality Date   BIOPSY  05/30/2023   Procedure: BIOPSY;  Surgeon:  Iva Boop, MD;  Location: [facility] ENDOSCOPY;  Service: Gastroenterology;;   COLONOSCOPY W/ POLYPECTOMY     COLONOSCOPY WITH PROPOFOL N/A 04/05/2018   Procedure: COLONOSCOPY WITH PROPOFOL;  Surgeon: Scot Jun, MD;  Location: [facility] ENDOSCOPY;  Service: Endoscopy;  Laterality: N/A;   COLOSTOMY     COLOSTOMY REVERSAL     CYST REMOVAL LEG     ENDOSCOPIC RETROGRADE CHOLANGIOPANCREATOGRAPHY (ERCP) WITH PROPOFOL N/A 05/30/2023   Procedure: ENDOSCOPIC RETROGRADE CHOLANGIOPANCREATOGRAPHY (ERCP) WITH PROPOFOL;  Surgeon: Iva Boop, MD;  Location: [facility] ENDOSCOPY;  Service: Gastroenterology;  Laterality: N/A;   KNEE ARTHROPLASTY Right 12/27/2015   Procedure: COMPUTER ASSISTED TOTAL KNEE ARTHROPLASTY;  Surgeon: Donato Heinz, MD;  Location: [facility] ORS;  Service: Orthopedics;  Laterality: Right;   KNEE ARTHROSCOPY Left    LIPOMA EXCISION     right thigh   LITHOTRIPSY     PANCREATIC STENT PLACEMENT  05/30/2023   Procedure: PANCREATIC STENT PLACEMENT;  Surgeon: Iva Boop, MD;  Location: [facility] ENDOSCOPY;  Service: Gastroenterology;;   TOTAL KNEE ARTHROPLASTY Left 10/16/2012   Patient Active Problem List   Diagnosis Date Noted  Protein-calorie malnutrition, severe 06/02/2023   Calculus of GB and bile duct w acute cholecyst w/o obst 06/02/2023   Acute pancreatitis without infection or necrosis 06/01/2023   Post-ERCP acute pancreatitis 05/31/2023   Choledocholithiasis 05/31/2023   Gastritis and gastroduodenitis 05/30/2023   Atrial fibrillation (HCC) 05/30/2023   Elevated bilirubin 05/29/2023   Generalized abdominal pain 05/29/2023   Cholelithiasis with choledocholithiasis 05/29/2023   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   Elevated LFTs 05/28/2023   OSA on CPAP 05/28/2023   Ascending aortic aneurysm (HCC) 07/26/2020   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   S/P total knee arthroplasty 12/27/2015   Major depressive disorder with single episode, in full remission (HCC) 12/23/2015    Anxiety 11/16/2015   Benign essential hypertension 11/16/2015   Depression 11/16/2015   Hypersomnia with sleep apnea 11/16/2015   Osteoarthritis 11/16/2015   Pure hypercholesterolemia 11/16/2015   Abdominal aortic aneurysm without rupture (HCC) 12/23/2014   Chronic kidney disease, stage 3 unspecified (HCC) 12/17/2014    ONSET DATE: May 27 2023  REFERRING DIAG: E44.1 (ICD-10-CM) - Mild protein-calorie malnutrition   THERAPY DIAG:  Muscle weakness (generalized)  Difficulty in walking, not elsewhere classified  Rationale for Evaluation and Treatment: Rehabilitation  SUBJECTIVE:                                                                                                                                                                                             SUBJECTIVE STATEMENT: Pt doing well, no changes reported.  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 Pt reports gallbladder attack July 14th 2024, went to ED, reports they found a "stone blockage." Pt then transferred to [facility] where ERCP was performed to remove stones of common bile duct. This was complicated by pancreatitis, heart failure and new onset of a-fib. He reports MRI taken at time where they found cancer in his liver. Pt has undergone 8 sessions of chemo, he also has a cholecystostomy tube that rests on his R side and down RLE. Pt reports future plans for procedure to "cut off blood supply" to R side of his liver and to remove gallbladder. Pt also with recent R portal vein, R hepatic vein embolization 12/04/23.  Recommendation for PT came following chemical stress test, where he reports physician wants him to get stronger before future procedures/surgeries. He has a follow-up in about 4 weeks. Pt notes decreased gait speed, cautious with balance, and decreased strength. No  falls. He has constant pain on  his R side he reports is due to gallbladder. He has a treadmill at home that he has been using and he likes to walk his dog. 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   PMH significant for CA, mets to liver, HF, hx of 2 knee replacements (years ago), a-fib, AAA, basal cell carcinoma, chronic kidney disease, diverticulitis, HTN, melanoma, OA, sleep apnea, vitamin b12 deficiency, chemo 07/2023, 10/2023 cholecystostomy tube placement, 12/04/2023 R portal vein, R hepatic vein emobolization, please refer to chart for full details.    PAIN:  Are you having pain? R side pain, reports due to gallbladder pain, cholecystostomy tube placement in this region  PRECAUTIONS: Fall and Other:    cholecystostomy tube; pt with ascending thoracic aneurism under surveillance,  advised by his physician to progress PT slowly ;  RED FLAGS: Abdominal aneurysm: Yes: says has been diagnosed with 3, being followed for this     WEIGHT BEARING RESTRICTIONS: No  FALLS: Has patient fallen in last 6 months? No  LIVING ENVIRONMENT: Lives with: lives with their spouse and and dog  Lives in: House/apartment Stairs: Yes: External: 2 steps; on left going up Has following equipment at home:  reports able to sit down in his shower, has chair  PLOF: Independent  PATIENT GOALS: improve strength  OBJECTIVE:  Note: Objective measures were completed at Evaluation unless otherwise noted.  DIAGNOSTIC FINDINGS:  Via chart PET myocardial perfusion stress and rest 11/30/2023 "   FINAL COMMENTS   Non-diagnostic CT obtained for attenuation correction: Coronary artery calcifications are present. Aortic atherosclerosis.   Conclusions:.   1. Myocardial perfusion is abnormal with evidence of ischemia  in the LCX.   2. Global/regional myocardial blood flow reserve is abnormal. There is evidence of a flow limiting stenosis in the LCX and   coronary microvascular  dysfunction.   3. Normal left ventricular systolic function.      Electronically Reviewed by: Pamala Hurry   (Electronically Signed)   Final Date: 30 November 2023 17:03 "  MRI abdomen 11/18/2023 " Impression: " Impression:  1. Similar size of the enhancing hepatic segment 7 lesion measuring up to  4.5 cm, biopsy-proven adenocarcinoma. No other suspicious hepatic lesions  in the liver.  2.  Decompressed gallbladder with interval removal of the cholecystostomy  tube. Small fluid collection adjacent to/within the gallbladder neck,  possibly an intramural abscess.  3.  Previously described indeterminant 1.1 cm right renal lesion is  compatible with a simple cyst.   Electronically Reviewed by:  Consuelo Pandy, MD, [facility]  Electronically Reviewed on:  11/19/2023 10:21 AM   I have reviewed the images and concur with the above findings.   Electronically Signed by:  Gypsy Decant, MD, [facility]  Electronically Signed on:  11/19/2023 10:46 AM "   Extensive imagine history, please refer to chart for full details   COGNITION: Overall cognitive status: Within functional limits for tasks assessed   SENSATION: WNL BLE   COORDINATION: Rapid alt UE and LE WNL BLE and BUE Chin to target WNL Heel>shin WNL   EDEMA:  He reports he has some ankle swelling, reports onset "since this all began"    POSTURE:  slight fwd posture, rounded shoulders   LOWER EXTREMITY MMT:   LLE grossly 4+/5 RLE grossly 4/5 proximal, 4+/5 distal   GAIT: Gait pattern:  Decreased gait speed, observed decrease in step length, decreased trunk rotation Distance walked: , clinic distances    FUNCTIONAL TESTS:  : 0.8 m/s  5xSTS:  16 sec use of BUE   TUG: 8.25 sec   Berg: deferred   : deferred   PATIENT SURVEYS:  deferred                                                                                                                              TREATMENT DATE:  12/31/23  TA:  Circuit 1:  with 1# aw each LE LAQ 15x each LE  Seated march 8x each LE STS 4x  Ambulation focus on endurance and cardio performance with SBA x296 ft - pt rates medium.    Circuit 2:  with 1# aw each LE STS 5x with swing BUE into shoulder flex <90 deg. Seated march 8x each LE Seated heel raise 15x STS 4x  Ambulation focus on endurance and cardio performance with SBA x150 ft   Circuit 3:  with 1# aw each LE Seated march 8x each LE Seated DF 15x bilat LE  STS 4x  Standing hip abduction 6x each LE - rates moderate Ambulation focus on endurance and cardio performance with SBA 150 ft   Circuit 4: with 1# aw each LE Standing hip abduction 5x  Mini squats 10x Ambulation focus on endurance and cardio performance with SBA 296 ft -  HR 90s-108 bpm  Comments: some discomfort with hip flexor-based movements reported   Remaining interventions -  Ambulation focus on endurance and cardio performance with SBA 296 ft - pt rates medium.   STS 5x   PATIENT EDUCATION: Education details: Pt educated throughout session about proper posture and technique with exercises. Improved exercise technique, movement at target joints, use of target muscles after min to mod verbal, visual, tactile cues. Person educated: Patient Education method: Explanation Education comprehension: verbalized understanding  HOME EXERCISE PROGRAM: Access Code: [email or web address] URL: [email or web address] Date: 12/18/2023 Prepared by: Temple Pacini  Exercises - Seated March  - 2 x daily - 5-7 x weekly - 3 sets - 10 reps - Sit to Stand with Counter Support  - 2 x daily - 5-7 x weekly - 2 sets - 5-10 reps - Standing Tandem Balance with Counter Support  - 1 x daily - 7 x weekly - 2 sets - 1 reps - 30 sec/leg hold   GOALS: Goals reviewed with patient? Yes  SHORT TERM GOALS: Target date: 01/23/2024     Patient will be independent in home exercise program to improve strength/mobility for better  functional independence with ADLs. Baseline: Goal status: INITIAL   LONG TERM GOALS: Target date: 03/04/2024     1.  Patient (> 36 years old) will complete five times sit to stand test in < 15 seconds indicating an increased LE strength and improved balance. Baseline: 16 sec use of BUE, very fatiguing  Goal status: INITIAL  3.  Patient will increase Berg Balance score to 56/56 to demonstrate decreased fall risk during functional activities Baseline: 55/56 Goal status: INITIAL/REVISED  4.  Patient will increase 10 meter walk test to >1.64m/s as to improve gait speed for better community ambulation and to reduce fall risk. Baseline: 0.8 m/s Goal status: INITIAL  5.  Patient will increase R proximal LE strength to at least 4+/5 on MMT for improved gait ability Baseline: RLE grossly 4/5 proximal Goal status: INITIAL  6.  Patient will increase six minute walk test distance to >1000 for progression to community ambulator and improve gait ability  Baseline: 754 ft no AD Goal status: INITIAL   7. Patient will increase ABC scale score >80% to demonstrate better functional mobility and better confidence with ADLs.   Baseline:  Goal status: INITIAL   ASSESSMENT:  CLINICAL IMPRESSION: Pt continues to progress endurance-based interventions. PT also introduced more standing strengthening for his LE. While p is making progress, he still requires multiple, brief rest breaks due to fatigue. The pt will benefit from further skilled PT to address deficits in order to improve strength, endurance, balance, and functional mobility to increase QOL and decrease fall risk.  OBJECTIVE IMPAIRMENTS: Abnormal gait, decreased activity tolerance, decreased balance, decreased endurance, decreased mobility, difficulty walking, decreased strength, increased edema, improper body mechanics, postural dysfunction, and pain.   ACTIVITY LIMITATIONS: lifting, squatting, transfers, and locomotion  level  PARTICIPATION LIMITATIONS: cleaning, shopping, community activity, and yard work  PERSONAL FACTORS: Age and 3+ comorbidities: PMH significant for CA, mets to liver, HF, hx of 2 knee replacements (years ago), a-fib, AAA, basal cell carcinoma, chronic kidney disease, diverticulitis, HTN, melanoma, OA, sleep apnea, vitamin b12 deficiency, chemo 07/2023  are also affecting patient's functional outcome.   REHAB POTENTIAL: Good  CLINICAL DECISION MAKING: Evolving/moderate complexity  EVALUATION COMPLEXITY: Moderate  PLAN:  PT FREQUENCY: 1-2x/week  PT DURATION: 12 weeks  PLANNED INTERVENTIONS: 97164- PT Re-evaluation, 97110-Therapeutic exercises, 97530- Therapeutic activity, 97112- Neuromuscular re-education, 97535- Self Care, 16109- Manual therapy, [phone]- Gait training, [phone]- Orthotic Fit/training, Patient/Family education, Balance training, Stair training, Joint mobilization, Spinal mobilization, Vestibular training, DME instructions, Cryotherapy, and Moist heat  PLAN FOR NEXT SESSION: endurance/cardio, strength, higher level balance   Baird Kay, PT 12/31/2023, 5:03 PM

## 2024-01-02 ENCOUNTER — Ambulatory Visit: Payer: Medicare Other

## 2024-01-02 DIAGNOSIS — R262 Difficulty in walking, not elsewhere classified: Secondary | ICD-10-CM

## 2024-01-02 DIAGNOSIS — R2681 Unsteadiness on feet: Secondary | ICD-10-CM

## 2024-01-02 DIAGNOSIS — M6281 Muscle weakness (generalized): Secondary | ICD-10-CM

## 2024-01-02 NOTE — Therapy (Signed)
 OUTPATIENT PHYSICAL THERAPY NEURO TREATMENT   Patient Name: Alexander Howe MRN: [id number] DOB:20-Sep-1946, 78 y.o., male Today's Date: 01/02/2024   PCP: [name], MD  REFERRING PROVIDER:  [name], MD     END OF SESSION:  PT End of Session - 01/02/24 1149     Visit Number 7    Number of Visits 25    Date for PT Re-Evaluation 03/05/24    PT Start Time 1150    PT Stop Time 1225    PT Time Calculation (min) 35 min    Equipment Utilized During Treatment Gait belt    Activity Tolerance Patient tolerated treatment well;Patient limited by fatigue    Behavior During Therapy WFL for tasks assessed/performed               Past Medical History:  Diagnosis Date   AAA (abdominal aortic aneurysm) without rupture (HCC)    Abdominal aneurysm (HCC)    3.5 cm   Actinic keratosis 08/26/2020   right lateral chest   Anxiety    Atrial fibrillation (HCC) 05/30/2023   Basal cell carcinoma 09/18/2018   left upper back/one excised, one superficial   Basal cell carcinoma 11/28/2018   left post shoulder   Basal cell carcinoma 08/26/2020   left medial pretibial, EDC 10/2020   Basal cell carcinoma 03/15/2022   right neck, EDC   Basal cell carcinoma (BCC) 08/31/2022   Posterior base of neck superficial. 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   right lateral chest, EDC 12/09/20   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(HCC)    Venous stasis    Venous stasis    Vitamin B 12 deficiency    Past Surgical History:  Procedure Laterality Date   BIOPSY  05/30/2023   Procedure: BIOPSY;  Surgeon:  Iva Boop, MD;  Location: [facility] ENDOSCOPY;  Service: Gastroenterology;;   COLONOSCOPY W/ POLYPECTOMY     COLONOSCOPY WITH PROPOFOL N/A 04/05/2018   Procedure: COLONOSCOPY WITH PROPOFOL;  Surgeon: Scot Jun, MD;  Location: [facility] ENDOSCOPY;  Service: Endoscopy;  Laterality: N/A;   COLOSTOMY     COLOSTOMY REVERSAL     CYST REMOVAL LEG     ENDOSCOPIC RETROGRADE CHOLANGIOPANCREATOGRAPHY (ERCP) WITH PROPOFOL N/A 05/30/2023   Procedure: ENDOSCOPIC RETROGRADE CHOLANGIOPANCREATOGRAPHY (ERCP) WITH PROPOFOL;  Surgeon: Iva Boop, MD;  Location: [facility] ENDOSCOPY;  Service: Gastroenterology;  Laterality: N/A;   KNEE ARTHROPLASTY Right 12/27/2015   Procedure: COMPUTER ASSISTED TOTAL KNEE ARTHROPLASTY;  Surgeon: Donato Heinz, MD;  Location: [facility] ORS;  Service: Orthopedics;  Laterality: Right;   KNEE ARTHROSCOPY Left    LIPOMA EXCISION     right thigh   LITHOTRIPSY     PANCREATIC STENT PLACEMENT  05/30/2023   Procedure: PANCREATIC STENT PLACEMENT;  Surgeon: Iva Boop, MD;  Location: [facility] ENDOSCOPY;  Service: Gastroenterology;;   TOTAL KNEE ARTHROPLASTY Left 10/16/2012   Patient Active Problem List   Diagnosis Date Noted  Protein-calorie malnutrition, severe 06/02/2023   Calculus of GB and bile duct w acute cholecyst w/o obst 06/02/2023   Acute pancreatitis without infection or necrosis 06/01/2023   Post-ERCP acute pancreatitis 05/31/2023   Choledocholithiasis 05/31/2023   Gastritis and gastroduodenitis 05/30/2023   Atrial fibrillation (HCC) 05/30/2023   Elevated bilirubin 05/29/2023   Generalized abdominal pain 05/29/2023   Cholelithiasis with choledocholithiasis 05/29/2023   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   Elevated LFTs 05/28/2023   OSA on CPAP 05/28/2023   Ascending aortic aneurysm (HCC) 07/26/2020   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   S/P total knee arthroplasty 12/27/2015   Major depressive disorder with single episode, in full remission (HCC) 12/23/2015    Anxiety 11/16/2015   Benign essential hypertension 11/16/2015   Depression 11/16/2015   Hypersomnia with sleep apnea 11/16/2015   Osteoarthritis 11/16/2015   Pure hypercholesterolemia 11/16/2015   Abdominal aortic aneurysm without rupture (HCC) 12/23/2014   Chronic kidney disease, stage 3 unspecified (HCC) 12/17/2014    ONSET DATE: May 27 2023  REFERRING DIAG: E44.1 (ICD-10-CM) - Mild protein-calorie malnutrition   THERAPY DIAG:  Muscle weakness (generalized)  Difficulty in walking, not elsewhere classified  Unsteadiness on feet  Rationale for Evaluation and Treatment: Rehabilitation  SUBJECTIVE:                                                                                                                                                                                             SUBJECTIVE STATEMENT: Pt reports hanging in there. Denies any specific pain.   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 Pt reports gallbladder attack July 14th 2024, went to ED, reports they found a "stone blockage." Pt then transferred to [facility] where ERCP was performed to remove stones of common bile duct. This was complicated by pancreatitis, heart failure and new onset of a-fib. He reports MRI taken at time where they found cancer in his liver. Pt has undergone 8 sessions of chemo, he also has a cholecystostomy tube that rests on his R side and down RLE. Pt reports future plans for procedure to "cut off blood supply" to R side of his liver and to remove gallbladder. Pt also with recent R portal vein, R hepatic vein embolization 12/04/23.  Recommendation for PT came following chemical stress test, where he reports physician wants him to get stronger before future procedures/surgeries. He has a follow-up in about 4 weeks. Pt notes decreased gait speed, cautious  with balance, and decreased  strength. No falls. He has constant pain on his R side he reports is due to gallbladder. He has a treadmill at home that he has been using and he likes to walk his dog. 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   PMH significant for CA, mets to liver, HF, hx of 2 knee replacements (years ago), a-fib, AAA, basal cell carcinoma, chronic kidney disease, diverticulitis, HTN, melanoma, OA, sleep apnea, vitamin b12 deficiency, chemo 07/2023, 10/2023 cholecystostomy tube placement, 12/04/2023 R portal vein, R hepatic vein emobolization, please refer to chart for full details.    PAIN:  Are you having pain? R side pain, reports due to gallbladder pain, cholecystostomy tube placement in this region  PRECAUTIONS: Fall and Other:    cholecystostomy tube; pt with ascending thoracic aneurism under surveillance,  advised by his physician to progress PT slowly ;  RED FLAGS: Abdominal aneurysm: Yes: says has been diagnosed with 3, being followed for this     WEIGHT BEARING RESTRICTIONS: No  FALLS: Has patient fallen in last 6 months? No  LIVING ENVIRONMENT: Lives with: lives with their spouse and and dog  Lives in: House/apartment Stairs: Yes: External: 2 steps; on left going up Has following equipment at home:  reports able to sit down in his shower, has chair  PLOF: Independent  PATIENT GOALS: improve strength  OBJECTIVE:  Note: Objective measures were completed at Evaluation unless otherwise noted.  DIAGNOSTIC FINDINGS:  Via chart PET myocardial perfusion stress and rest 11/30/2023 "   FINAL COMMENTS   Non-diagnostic CT obtained for attenuation correction: Coronary artery calcifications are present. Aortic atherosclerosis.   Conclusions:.   1. Myocardial perfusion is abnormal with evidence of ischemia  in the LCX.   2. Global/regional myocardial blood flow reserve is abnormal. There is evidence of a flow limiting stenosis in the  LCX and   coronary microvascular dysfunction.   3. Normal left ventricular systolic function.      Electronically Reviewed by: Pamala Hurry   (Electronically Signed)   Final Date: 30 November 2023 17:03 "  MRI abdomen 11/18/2023 " Impression: " Impression:  1. Similar size of the enhancing hepatic segment 7 lesion measuring up to  4.5 cm, biopsy-proven adenocarcinoma. No other suspicious hepatic lesions  in the liver.  2.  Decompressed gallbladder with interval removal of the cholecystostomy  tube. Small fluid collection adjacent to/within the gallbladder neck,  possibly an intramural abscess.  3.  Previously described indeterminant 1.1 cm right renal lesion is  compatible with a simple cyst.   Electronically Reviewed by:  Consuelo Pandy, MD, [facility]  Electronically Reviewed on:  11/19/2023 10:21 AM   I have reviewed the images and concur with the above findings.   Electronically Signed by:  Gypsy Decant, MD, [facility]  Electronically Signed on:  11/19/2023 10:46 AM "   Extensive imagine history, please refer to chart for full details   COGNITION: Overall cognitive status: Within functional limits for tasks assessed   SENSATION: WNL BLE   COORDINATION: Rapid alt UE and LE WNL BLE and BUE Chin to target WNL Heel>shin WNL   EDEMA:  He reports he has some ankle swelling, reports onset "since this all began"    POSTURE:  slight fwd posture, rounded shoulders   LOWER EXTREMITY MMT:   LLE grossly 4+/5 RLE grossly 4/5 proximal, 4+/5 distal   GAIT: Gait pattern:  Decreased gait speed, observed decrease in step length, decreased trunk rotation Distance walked:  , clinic distances    FUNCTIONAL TESTS:   : 0.8 m/s  5xSTS:  16 sec use of BUE   TUG: 8.25 sec   Berg: deferred   : deferred   PATIENT SURVEYS:  deferred                                                                                                                               TREATMENT DATE: 01/02/24  TA:  Circuit 1:  with 1# aw each LE Seated hip march- VC for slow and controlled motion x 10 LAQ 15x each LE  STS 4x  Ambulation focus on endurance and cardio performance with SBA x160 ft - pt rates medium.    Circuit 2:  with 1# aw each LE STS 5x with swing BUE into shoulder flex <90 deg. Seated hip add squeeze- hold 3 sec x 10 reps Seated heel raise 15x Ambulation focus on endurance and cardio performance with SBA x150 ft   Circuit 3:  with 1# aw each LE Seated march 8x each LE Seated DF 15x bilat LE  STS 4x  Standing hip abduction 6x each LE - rates moderate Ambulation focus on endurance and cardio performance with SBA 150 ft   Circuit 4: with 1# aw each LE Ambulation focus on endurance and cardio performance with SBA 200 ft  Comments: Limited at end so just walked to end session.   PATIENT EDUCATION: Education details: Pt educated throughout session about proper posture and technique with exercises. Improved exercise technique, movement at target joints, use of target muscles after min to mod verbal, visual, tactile cues. Person educated: Patient Education method: Explanation Education comprehension: verbalized understanding  HOME EXERCISE PROGRAM: Access Code: [email or web address] URL: [email or web address] Date: 12/18/2023 Prepared by: Temple Pacini  Exercises - Seated March  - 2 x daily - 5-7 x weekly - 3 sets - 10 reps - Sit to Stand with Counter Support  - 2 x daily - 5-7 x weekly - 2 sets - 5-10 reps - Standing Tandem Balance with Counter Support  - 1 x daily - 7 x weekly - 2 sets - 1 reps - 30 sec/leg hold   GOALS: Goals reviewed with patient? Yes  SHORT TERM GOALS: Target date: 01/23/2024     Patient will be independent in home exercise program to improve strength/mobility for better functional independence with ADLs. Baseline: Goal status: INITIAL   LONG TERM GOALS: Target date: 03/04/2024     1.  Patient (> 36  years old) will complete five times sit to stand test in < 15 seconds indicating an increased LE strength and improved balance. Baseline: 16 sec use of BUE, very fatiguing  Goal status: INITIAL  3.  Patient will increase Berg Balance score to 56/56 to demonstrate decreased fall risk during functional activities Baseline: 55/56 Goal status: INITIAL/REVISED  4.  Patient will increase 10 meter walk test to >1.31m/s as to improve gait speed for better community  ambulation and to reduce fall risk. Baseline: 0.8 m/s Goal status: INITIAL  5.  Patient will increase R proximal LE strength to at least 4+/5 on MMT for improved gait ability Baseline: RLE grossly 4/5 proximal Goal status: INITIAL  6.  Patient will increase six minute walk test distance to >1000 for progression to community ambulator and improve gait ability  Baseline: 754 ft no AD Goal status: INITIAL   7. Patient will increase ABC scale score >80% to demonstrate better functional mobility and better confidence with ADLs.   Baseline:  Goal status: INITIAL   ASSESSMENT:  CLINICAL IMPRESSION: Treatment continued to focus on progressive LE strengthening and endurance-based interventions. PT was able to complete exercises using same 1lb resistance but presented more fatigued overall so did not progress to much standing other than walking with adequate rest He remains motivated but may need to miss some visits upcoming due to multiple medical appointments and upcoming surgery to remove gall bladder and cancer on liver. The pt will benefit from further skilled PT to address deficits in order to improve strength, endurance, balance, and functional mobility to increase QOL and decrease fall risk.  OBJECTIVE IMPAIRMENTS: Abnormal gait, decreased activity tolerance, decreased balance, decreased endurance, decreased mobility, difficulty walking, decreased strength, increased edema, improper body mechanics, postural dysfunction, and pain.    ACTIVITY LIMITATIONS: lifting, squatting, transfers, and locomotion level  PARTICIPATION LIMITATIONS: cleaning, shopping, community activity, and yard work  PERSONAL FACTORS: Age and 3+ comorbidities: PMH significant for CA, mets to liver, HF, hx of 2 knee replacements (years ago), a-fib, AAA, basal cell carcinoma, chronic kidney disease, diverticulitis, HTN, melanoma, OA, sleep apnea, vitamin b12 deficiency, chemo 07/2023  are also affecting patient's functional outcome.   REHAB POTENTIAL: Good  CLINICAL DECISION MAKING: Evolving/moderate complexity  EVALUATION COMPLEXITY: Moderate  PLAN:  PT FREQUENCY: 1-2x/week  PT DURATION: 12 weeks  PLANNED INTERVENTIONS: 97164- PT Re-evaluation, 97110-Therapeutic exercises, 97530- Therapeutic activity, 97112- Neuromuscular re-education, 97535- Self Care, [id number]- Manual therapy, [phone]- Gait training, [phone]- Orthotic Fit/training, Patient/Family education, Balance training, Stair training, Joint mobilization, Spinal mobilization, Vestibular training, DME instructions, Cryotherapy, and Moist heat  PLAN FOR NEXT SESSION: endurance/cardio, strength, higher level balance   Lenda Kelp, PT 01/02/2024, 2:26 PM

## 2024-01-07 ENCOUNTER — Ambulatory Visit: Payer: Medicare Other

## 2024-01-09 ENCOUNTER — Ambulatory Visit: Payer: Medicare Other

## 2024-01-09 DIAGNOSIS — M6281 Muscle weakness (generalized): Secondary | ICD-10-CM | POA: Diagnosis not present

## 2024-01-09 DIAGNOSIS — R262 Difficulty in walking, not elsewhere classified: Secondary | ICD-10-CM

## 2024-01-09 NOTE — Therapy (Signed)
 OUTPATIENT PHYSICAL THERAPY NEURO TREATMENT   Patient Name: Alexander Howe MRN: [id number] DOB:1946/09/01, 78 y.o., male Today's Date: 01/09/2024   PCP: [name], MD  REFERRING PROVIDER:  [name], MD     END OF SESSION:  PT End of Session - 01/09/24 1717     Visit Number 8    Number of Visits 25    Date for PT Re-Evaluation 03/05/24    PT Start Time 1401    PT Stop Time 1445    PT Time Calculation (min) 44 min    Equipment Utilized During Treatment Gait belt    Activity Tolerance Patient tolerated treatment well;Patient limited by fatigue    Behavior During Therapy WFL for tasks assessed/performed                Past Medical History:  Diagnosis Date   AAA (abdominal aortic aneurysm) without rupture (HCC)    Abdominal aneurysm (HCC)    3.5 cm   Actinic keratosis 08/26/2020   right lateral chest   Anxiety    Atrial fibrillation (HCC) 05/30/2023   Basal cell carcinoma 09/18/2018   left upper back/one excised, one superficial   Basal cell carcinoma 11/28/2018   left post shoulder   Basal cell carcinoma 08/26/2020   left medial pretibial, EDC 10/2020   Basal cell carcinoma 03/15/2022   right neck, EDC   Basal cell carcinoma (BCC) 08/31/2022   Posterior base of neck superficial. 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   right lateral chest, EDC 12/09/20   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(HCC)    Venous stasis    Venous stasis    Vitamin B 12 deficiency    Past Surgical History:  Procedure Laterality Date   BIOPSY  05/30/2023   Procedure: BIOPSY;  Surgeon:  Iva Boop, MD;  Location: [facility] ENDOSCOPY;  Service: Gastroenterology;;   COLONOSCOPY W/ POLYPECTOMY     COLONOSCOPY WITH PROPOFOL N/A 04/05/2018   Procedure: COLONOSCOPY WITH PROPOFOL;  Surgeon: Scot Jun, MD;  Location: [facility] ENDOSCOPY;  Service: Endoscopy;  Laterality: N/A;   COLOSTOMY     COLOSTOMY REVERSAL     CYST REMOVAL LEG     ENDOSCOPIC RETROGRADE CHOLANGIOPANCREATOGRAPHY (ERCP) WITH PROPOFOL N/A 05/30/2023   Procedure: ENDOSCOPIC RETROGRADE CHOLANGIOPANCREATOGRAPHY (ERCP) WITH PROPOFOL;  Surgeon: Iva Boop, MD;  Location: [facility] ENDOSCOPY;  Service: Gastroenterology;  Laterality: N/A;   KNEE ARTHROPLASTY Right 12/27/2015   Procedure: COMPUTER ASSISTED TOTAL KNEE ARTHROPLASTY;  Surgeon: Donato Heinz, MD;  Location: [facility] ORS;  Service: Orthopedics;  Laterality: Right;   KNEE ARTHROSCOPY Left    LIPOMA EXCISION     right thigh   LITHOTRIPSY     PANCREATIC STENT PLACEMENT  05/30/2023   Procedure: PANCREATIC STENT PLACEMENT;  Surgeon: Iva Boop, MD;  Location: [facility] ENDOSCOPY;  Service: Gastroenterology;;   TOTAL KNEE ARTHROPLASTY Left 10/16/2012   Patient Active Problem List   Diagnosis Date Noted  Protein-calorie malnutrition, severe 06/02/2023   Calculus of GB and bile duct w acute cholecyst w/o obst 06/02/2023   Acute pancreatitis without infection or necrosis 06/01/2023   Post-ERCP acute pancreatitis 05/31/2023   Choledocholithiasis 05/31/2023   Gastritis and gastroduodenitis 05/30/2023   Atrial fibrillation (HCC) 05/30/2023   Elevated bilirubin 05/29/2023   Generalized abdominal pain 05/29/2023   Cholelithiasis with choledocholithiasis 05/29/2023   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   Elevated LFTs 05/28/2023   OSA on CPAP 05/28/2023   Ascending aortic aneurysm (HCC) 07/26/2020   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   S/P total knee arthroplasty 12/27/2015   Major depressive disorder with single episode, in full remission (HCC) 12/23/2015    Anxiety 11/16/2015   Benign essential hypertension 11/16/2015   Depression 11/16/2015   Hypersomnia with sleep apnea 11/16/2015   Osteoarthritis 11/16/2015   Pure hypercholesterolemia 11/16/2015   Abdominal aortic aneurysm without rupture (HCC) 12/23/2014   Chronic kidney disease, stage 3 unspecified (HCC) 12/17/2014    ONSET DATE: May 27 2023  REFERRING DIAG: E44.1 (ICD-10-CM) - Mild protein-calorie malnutrition   THERAPY DIAG:  Difficulty in walking, not elsewhere classified  Muscle weakness (generalized)  Rationale for Evaluation and Treatment: Rehabilitation  SUBJECTIVE:                                                                                                                                                                                             SUBJECTIVE STATEMENT: Pt reports surgery has been postponed, says surgeon didn't feel he was quite ready for the surgery. New surgery date is for March 25th. He reports no pain. He still feels fatigued at times.   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 Pt reports gallbladder attack July 14th 2024, went to ED, reports they found a "stone blockage." Pt then transferred to [facility] where ERCP was performed to remove stones of common bile duct. This was complicated by pancreatitis, heart failure and new onset of a-fib. He reports MRI taken at time where they found cancer in his liver. Pt has undergone 8 sessions of chemo, he also has a cholecystostomy tube that rests on his R side and down RLE. Pt reports future plans for procedure to "cut off blood supply" to R side of his liver and to remove gallbladder. Pt also with recent R portal vein, R hepatic vein embolization 12/04/23.  Recommendation for PT came following chemical stress test, where he reports physician wants him to get stronger  before future procedures/surgeries. He has a follow-up in about 4 weeks. Pt notes decreased gait speed, cautious with balance, and decreased strength. No falls. He has constant pain on his R side he reports is due to gallbladder. He has a treadmill at home that he has been using and he likes to walk his dog. 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   PMH significant for CA, mets to liver, HF, hx of 2 knee replacements (years ago), a-fib, AAA, basal cell carcinoma, chronic kidney disease, diverticulitis, HTN, melanoma, OA, sleep apnea, vitamin b12 deficiency, chemo 07/2023, 10/2023 cholecystostomy tube placement, 12/04/2023 R portal vein, R hepatic vein emobolization, please refer to chart for full details.    PAIN:  Are you having pain? R side pain, reports due to gallbladder pain, cholecystostomy tube placement in this region  PRECAUTIONS: Fall and Other:    cholecystostomy tube; pt with ascending thoracic aneurism under surveillance,  advised by his physician to progress PT slowly ;  RED FLAGS: Abdominal aneurysm: Yes: says has been diagnosed with 3, being followed for this     WEIGHT BEARING RESTRICTIONS: No  FALLS: Has patient fallen in last 6 months? No  LIVING ENVIRONMENT: Lives with: lives with their spouse and and dog  Lives in: House/apartment Stairs: Yes: External: 2 steps; on left going up Has following equipment at home:  reports able to sit down in his shower, has chair  PLOF: Independent  PATIENT GOALS: improve strength  OBJECTIVE:  Note: Objective measures were completed at Evaluation unless otherwise noted.  DIAGNOSTIC FINDINGS:  Via chart PET myocardial perfusion stress and rest 11/30/2023 "   FINAL COMMENTS   Non-diagnostic CT obtained for attenuation correction: Coronary artery calcifications are present. Aortic atherosclerosis.   Conclusions:.   1. Myocardial perfusion is abnormal with evidence of ischemia  in the LCX.    2. Global/regional myocardial blood flow reserve is abnormal. There is evidence of a flow limiting stenosis in the LCX and   coronary microvascular dysfunction.   3. Normal left ventricular systolic function.      Electronically Reviewed by: Pamala Hurry   (Electronically Signed)   Final Date: 30 November 2023 17:03 "  MRI abdomen 11/18/2023 " Impression: " Impression:  1. Similar size of the enhancing hepatic segment 7 lesion measuring up to  4.5 cm, biopsy-proven adenocarcinoma. No other suspicious hepatic lesions  in the liver.  2.  Decompressed gallbladder with interval removal of the cholecystostomy  tube. Small fluid collection adjacent to/within the gallbladder neck,  possibly an intramural abscess.  3.  Previously described indeterminant 1.1 cm right renal lesion is  compatible with a simple cyst.   Electronically Reviewed by:  Consuelo Pandy, MD, [facility]  Electronically Reviewed on:  11/19/2023 10:21 AM   I have reviewed the images and concur with the above findings.   Electronically Signed by:  Gypsy Decant, MD, [facility]  Electronically Signed on:  11/19/2023 10:46 AM "   Extensive imagine history, please refer to chart for full details   COGNITION: Overall cognitive status: Within functional limits for tasks assessed   SENSATION: WNL BLE   COORDINATION: Rapid alt UE and LE WNL BLE and BUE Chin to target WNL Heel>shin WNL   EDEMA:  He reports he has some ankle swelling, reports onset "since this all began"    POSTURE:  slight fwd posture, rounded shoulders   LOWER EXTREMITY MMT:   LLE grossly 4+/5 RLE grossly 4/5 proximal,  4+/5 distal   GAIT: Gait pattern:  Decreased gait speed, observed decrease in step length, decreased trunk rotation Distance walked: , clinic distances    FUNCTIONAL TESTS:   : 0.8 m/s  5xSTS:  16 sec use of BUE   TUG: 8.25 sec   Berg: deferred   : deferred   PATIENT SURVEYS:  deferred                                                                                                                               TREATMENT DATE: 01/09/24  TA: STS 3x8 - medium HR 105 bpm   Treadmill 6 minutes up to 0.6 mph, majority of time around 0.4-0.5 mph. Rates moderate. HR maxed at 114 bpm  Circuit 1:  with 1.5# aw each LE Seated hip march-  x 8 each LE LAQ 10x each LE  STS 2x  Ambulation focus on endurance and cardio performance with SBA x160 ft - pt rates medium-hard   Circuit 2:  with 1.5# aw each LE Seated step-outs 10x each LE  Seated heel raise 12x LAQ 10x each LE  - rates harder with this weight, HR around 101 bpm  Ambulation focus on endurance and cardio performance with SBA x150 ft   Nustep endurance and cardio-performance training:  Lvl 1-2 x 4 min - pt reports intervention feels a little harder today   Discussion of how to safely advance treadmill training at home   Rest breaks throughout  PATIENT EDUCATION: Education details: Pt educated throughout session about proper posture and technique with exercises. Improved exercise technique, movement at target joints, use of target muscles after min to mod verbal, visual, tactile cues. Person educated: Patient Education method: Explanation Education comprehension: verbalized understanding  HOME EXERCISE PROGRAM: Access Code: [email or web address] URL: [email or web address] Date: 12/18/2023 Prepared by: Temple Pacini  Exercises - Seated March  - 2 x daily - 5-7 x weekly - 3 sets - 10 reps - Sit to Stand with Counter Support  - 2 x daily - 5-7 x weekly - 2 sets - 5-10 reps - Standing Tandem Balance with Counter Support  - 1 x daily - 7 x weekly - 2 sets - 1 reps - 30 sec/leg hold   GOALS: Goals reviewed with patient? Yes  SHORT TERM GOALS: Target date: 01/23/2024     Patient will be independent in home exercise program to improve strength/mobility for better functional independence with ADLs. Baseline: Goal status:  INITIAL   LONG TERM GOALS: Target date: 03/04/2024     1.  Patient (> 69 years old) will complete five times sit to stand test in < 15 seconds indicating an increased LE strength and improved balance. Baseline: 16 sec use of BUE, very fatiguing  Goal status: INITIAL  3.  Patient will increase Berg Balance score to 56/56 to demonstrate decreased fall risk during functional activities Baseline: 55/56 Goal status: INITIAL/REVISED  4.  Patient will increase 10 meter walk  test to >1.55m/s as to improve gait speed for better community ambulation and to reduce fall risk. Baseline: 0.8 m/s Goal status: INITIAL  5.  Patient will increase R proximal LE strength to at least 4+/5 on MMT for improved gait ability Baseline: RLE grossly 4/5 proximal Goal status: INITIAL  6.  Patient will increase six minute walk test distance to >1000 for progression to community ambulator and improve gait ability  Baseline: 754 ft no AD Goal status: INITIAL   7. Patient will increase ABC scale score >80% to demonstrate better functional mobility and better confidence with ADLs.   Baseline:  Goal status: INITIAL   ASSESSMENT:  CLINICAL IMPRESSION: Pt able to initiate treadmill training today. This was very challenging but he was able to complete 6 min up to 0.6 mph. Pt also able to advance level of resistance used with other interventions. Frequent, brief rest breaks continue to be provided. The pt will benefit from further skilled PT to address deficits in order to improve strength, endurance, balance, and functional mobility to increase QOL and decrease fall risk.  OBJECTIVE IMPAIRMENTS: Abnormal gait, decreased activity tolerance, decreased balance, decreased endurance, decreased mobility, difficulty walking, decreased strength, increased edema, improper body mechanics, postural dysfunction, and pain.   ACTIVITY LIMITATIONS: lifting, squatting, transfers, and locomotion level  PARTICIPATION LIMITATIONS:  cleaning, shopping, community activity, and yard work  PERSONAL FACTORS: Age and 3+ comorbidities: PMH significant for CA, mets to liver, HF, hx of 2 knee replacements (years ago), a-fib, AAA, basal cell carcinoma, chronic kidney disease, diverticulitis, HTN, melanoma, OA, sleep apnea, vitamin b12 deficiency, chemo 07/2023  are also affecting patient's functional outcome.   REHAB POTENTIAL: Good  CLINICAL DECISION MAKING: Evolving/moderate complexity  EVALUATION COMPLEXITY: Moderate  PLAN:  PT FREQUENCY: 1-2x/week  PT DURATION: 12 weeks  PLANNED INTERVENTIONS: 97164- PT Re-evaluation, 97110-Therapeutic exercises, 97530- Therapeutic activity, 97112- Neuromuscular re-education, 97535- Self Care, [id number]- Manual therapy, [phone]- Gait training, [phone]- Orthotic Fit/training, Patient/Family education, Balance training, Stair training, Joint mobilization, Spinal mobilization, Vestibular training, DME instructions, Cryotherapy, and Moist heat  PLAN FOR NEXT SESSION: endurance/cardio, strength, higher level balance   Baird Kay, PT 01/09/2024, 5:20 PM

## 2024-01-14 ENCOUNTER — Ambulatory Visit: Payer: Medicare Other | Attending: Internal Medicine

## 2024-01-14 DIAGNOSIS — M6281 Muscle weakness (generalized): Secondary | ICD-10-CM | POA: Insufficient documentation

## 2024-01-14 DIAGNOSIS — R262 Difficulty in walking, not elsewhere classified: Secondary | ICD-10-CM | POA: Insufficient documentation

## 2024-01-14 DIAGNOSIS — R2681 Unsteadiness on feet: Secondary | ICD-10-CM | POA: Diagnosis present

## 2024-01-14 NOTE — Therapy (Signed)
 OUTPATIENT PHYSICAL THERAPY NEURO TREATMENT   Patient Name: Alexander Howe MRN: [id number] DOB:[birth year], 78 y.o., male Today's Date: 01/14/2024   PCP: [name], MD  REFERRING PROVIDER:  [name], MD     END OF SESSION:  PT End of Session - 01/14/24 1455     Visit Number 9    Number of Visits 25    Date for PT Re-Evaluation 03/05/24    PT Start Time 1403    PT Stop Time 1451    PT Time Calculation (min) 48 min    Equipment Utilized During Treatment Gait belt    Activity Tolerance Patient tolerated treatment well;Patient limited by fatigue    Behavior During Therapy WFL for tasks assessed/performed                 Past Medical History:  Diagnosis Date   AAA (abdominal aortic aneurysm) without rupture (HCC)    Abdominal aneurysm (HCC)    3.5 cm   Actinic keratosis 08/26/2020   right lateral chest   Anxiety    Atrial fibrillation (HCC) 05/30/2023   Basal cell carcinoma 09/18/2018   left upper back/one excised, one superficial   Basal cell carcinoma 11/28/2018   left post shoulder   Basal cell carcinoma 08/26/2020   left medial pretibial, EDC 10/2020   Basal cell carcinoma 03/15/2022   right neck, EDC   Basal cell carcinoma (BCC) 08/31/2022   Posterior base of neck superficial. 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   right lateral chest, EDC 12/09/20   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(HCC)    Venous stasis    Venous stasis    Vitamin B 12 deficiency    Past Surgical History:  Procedure Laterality Date   BIOPSY  05/30/2023   Procedure: BIOPSY;  Surgeon:  Iva Boop, MD;  Location: [facility] ENDOSCOPY;  Service: Gastroenterology;;   COLONOSCOPY W/ POLYPECTOMY     COLONOSCOPY WITH PROPOFOL N/A 04/05/2018   Procedure: COLONOSCOPY WITH PROPOFOL;  Surgeon: Scot Jun, MD;  Location: [facility] ENDOSCOPY;  Service: Endoscopy;  Laterality: N/A;   COLOSTOMY     COLOSTOMY REVERSAL     CYST REMOVAL LEG     ENDOSCOPIC RETROGRADE CHOLANGIOPANCREATOGRAPHY (ERCP) WITH PROPOFOL N/A 05/30/2023   Procedure: ENDOSCOPIC RETROGRADE CHOLANGIOPANCREATOGRAPHY (ERCP) WITH PROPOFOL;  Surgeon: Iva Boop, MD;  Location: [facility] ENDOSCOPY;  Service: Gastroenterology;  Laterality: N/A;   KNEE ARTHROPLASTY Right 12/27/2015   Procedure: COMPUTER ASSISTED TOTAL KNEE ARTHROPLASTY;  Surgeon: Donato Heinz, MD;  Location: [facility] ORS;  Service: Orthopedics;  Laterality: Right;   KNEE ARTHROSCOPY Left    LIPOMA EXCISION     right thigh   LITHOTRIPSY     PANCREATIC STENT PLACEMENT  05/30/2023   Procedure: PANCREATIC STENT PLACEMENT;  Surgeon: Iva Boop, MD;  Location: [facility] ENDOSCOPY;  Service: Gastroenterology;;   TOTAL KNEE ARTHROPLASTY Left 10/16/2012   Patient Active Problem List   Diagnosis Date  Noted   Protein-calorie malnutrition, severe 06/02/2023   Calculus of GB and bile duct w acute cholecyst w/o obst 06/02/2023   Acute pancreatitis without infection or necrosis 06/01/2023   Post-ERCP acute pancreatitis 05/31/2023   Choledocholithiasis 05/31/2023   Gastritis and gastroduodenitis 05/30/2023   Atrial fibrillation (HCC) 05/30/2023   Elevated bilirubin 05/29/2023   Generalized abdominal pain 05/29/2023   Cholelithiasis with choledocholithiasis 05/29/2023   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   Elevated LFTs 05/28/2023   OSA on CPAP 05/28/2023   Ascending aortic aneurysm (HCC) 07/26/2020   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   S/P total knee arthroplasty 12/27/2015   Major depressive disorder with single episode, in full remission (HCC) 12/23/2015    Anxiety 11/16/2015   Benign essential hypertension 11/16/2015   Depression 11/16/2015   Hypersomnia with sleep apnea 11/16/2015   Osteoarthritis 11/16/2015   Pure hypercholesterolemia 11/16/2015   Abdominal aortic aneurysm without rupture (HCC) 12/23/2014   Chronic kidney disease, stage 3 unspecified (HCC) 12/17/2014    ONSET DATE: May 27 2023  REFERRING DIAG: E44.1 (ICD-10-CM) - Mild protein-calorie malnutrition   THERAPY DIAG:  Muscle weakness (generalized)  Difficulty in walking, not elsewhere classified  Rationale for Evaluation and Treatment: Rehabilitation  SUBJECTIVE:                                                                                                                                                                                             SUBJECTIVE STATEMENT: Pt seeing his surgeon on the 17th, will have procedure on the 24th. Pt reports no pain at the moment. Has been completing his HEP.  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 Pt reports gallbladder attack July 14th 2024, went to ED, reports they found a "stone blockage." Pt then transferred to [facility] where ERCP was performed to remove stones of common bile duct. This was complicated by pancreatitis, heart failure and new onset of a-fib. He reports MRI taken at time where they found cancer in his liver. Pt has undergone 8 sessions of chemo, he also has a cholecystostomy tube that rests on his R side and down RLE. Pt reports future plans for procedure to "cut off blood supply" to R side of his liver and to remove gallbladder. Pt also with recent R portal vein, R hepatic vein embolization 12/04/23.  Recommendation for PT came following chemical stress test, where he reports physician wants him to get stronger before future procedures/surgeries. He has a follow-up in  about  4 weeks. Pt notes decreased gait speed, cautious with balance, and decreased strength. No falls. He has constant pain on his R side he reports is due to gallbladder. He has a treadmill at home that he has been using and he likes to walk his dog. 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   PMH significant for CA, mets to liver, HF, hx of 2 knee replacements (years ago), a-fib, AAA, basal cell carcinoma, chronic kidney disease, diverticulitis, HTN, melanoma, OA, sleep apnea, vitamin b12 deficiency, chemo 07/2023, 10/2023 cholecystostomy tube placement, 12/04/2023 R portal vein, R hepatic vein emobolization, please refer to chart for full details.    PAIN:  Are you having pain? R side pain, reports due to gallbladder pain, cholecystostomy tube placement in this region  PRECAUTIONS: Fall and Other:    cholecystostomy tube; pt with ascending thoracic aneurism under surveillance,  advised by his physician to progress PT slowly ;  RED FLAGS: Abdominal aneurysm: Yes: says has been diagnosed with 3, being followed for this     WEIGHT BEARING RESTRICTIONS: No  FALLS: Has patient fallen in last 6 months? No  LIVING ENVIRONMENT: Lives with: lives with their spouse and and dog  Lives in: House/apartment Stairs: Yes: External: 2 steps; on left going up Has following equipment at home:  reports able to sit down in his shower, has chair  PLOF: Independent  PATIENT GOALS: improve strength  OBJECTIVE:  Note: Objective measures were completed at Evaluation unless otherwise noted.  DIAGNOSTIC FINDINGS:  Via chart PET myocardial perfusion stress and rest 11/30/2023 "   FINAL COMMENTS   Non-diagnostic CT obtained for attenuation correction: Coronary artery calcifications are present. Aortic atherosclerosis.   Conclusions:.   1. Myocardial perfusion is abnormal with evidence of ischemia  in the LCX.   2. Global/regional myocardial blood flow reserve is abnormal.  There is evidence of a flow limiting stenosis in the LCX and   coronary microvascular dysfunction.   3. Normal left ventricular systolic function.      Electronically Reviewed by: Pamala Hurry   (Electronically Signed)   Final Date: 30 November 2023 17:03 "  MRI abdomen 11/18/2023 " Impression: " Impression:  1. Similar size of the enhancing hepatic segment 7 lesion measuring up to  4.5 cm, biopsy-proven adenocarcinoma. No other suspicious hepatic lesions  in the liver.  2.  Decompressed gallbladder with interval removal of the cholecystostomy  tube. Small fluid collection adjacent to/within the gallbladder neck,  possibly an intramural abscess.  3.  Previously described indeterminant 1.1 cm right renal lesion is  compatible with a simple cyst.   Electronically Reviewed by:  Consuelo Pandy, MD, [facility]  Electronically Reviewed on:  11/19/2023 10:21 AM   I have reviewed the images and concur with the above findings.   Electronically Signed by:  Gypsy Decant, MD, [facility]  Electronically Signed on:  11/19/2023 10:46 AM "   Extensive imagine history, please refer to chart for full details   COGNITION: Overall cognitive status: Within functional limits for tasks assessed   SENSATION: WNL BLE   COORDINATION: Rapid alt UE and LE WNL BLE and BUE Chin to target WNL Heel>shin WNL   EDEMA:  He reports he has some ankle swelling, reports onset "since this all began"    POSTURE:  slight fwd posture, rounded shoulders   LOWER EXTREMITY MMT:   LLE grossly 4+/5 RLE grossly 4/5 proximal, 4+/5 distal   GAIT: Gait pattern:  Decreased gait speed, observed decrease in step length, decreased trunk rotation Distance walked: , clinic distances    FUNCTIONAL TESTS:   : 0.8 m/s  5xSTS:  16 sec use of BUE   TUG: 8.25 sec   Berg: deferred   : deferred   PATIENT SURVEYS:  deferred                                                                                                                               TREATMENT DATE: 01/14/24  TA: to promote ease with transfers, gait, to promote strength, endurance and cardio-performance  STS 3x10 - Rates moderate  - hands-free  Treadmill 6 minutes up to 0.8 mph, up to 1% elevation - HR peaks around 93 bpm  Standing hip abduction with hamstring curl 10x  Standing squats with UE support 10x --Progressed to standing squats with heel raise in squat position 5x - Rates hard   Ambulation focus on endurance and cardio performance with SBA 2x296 ft - pt rates medium , HR peaks 100-105 bpm post-intervention  Discussed how to progress cardio at home  Standing heel raises 2x10   Standing hip flexion to abduction 1/4 circles 10x each LE - rates easy-moderate HR around 93 following intervention, radial pulse palpated and steady. PT feeling OK. PT instructs pt to track HR at home, monitor how you feel following today's activities.   Self-Care/Home management:   Discussion of how to safely advance treadmill training at home    Rest breaks throughout PATIENT EDUCATION: Education details: Pt educated throughout session about proper posture and technique with exercises. Improved exercise technique, movement at target joints, use of target muscles after min to mod verbal, visual, tactile cues. Person educated: Patient Education method: Explanation Education comprehension: verbalized understanding  HOME EXERCISE PROGRAM: Access Code: [email or web address] URL: [email or web address] Date: 12/18/2023 Prepared by: Temple Pacini  Exercises - Seated March  - 2 x daily - 5-7 x weekly - 3 sets - 10 reps - Sit to Stand with Counter Support  - 2 x daily - 5-7 x weekly - 2 sets - 5-10 reps - Standing Tandem Balance with Counter Support  - 1 x daily - 7 x weekly - 2 sets - 1 reps - 30 sec/leg hold   GOALS: Goals reviewed with patient? Yes  SHORT TERM GOALS: Target date: 01/23/2024     Patient will be  independent in home exercise program to improve strength/mobility for better functional independence with ADLs. Baseline: Goal status: INITIAL   LONG TERM GOALS: Target date: 03/04/2024     1.  Patient (> 35 years old) will complete five times sit to stand test in < 15 seconds indicating an increased LE strength and improved balance. Baseline: 16 sec use of BUE, very fatiguing  Goal status: INITIAL  3.  Patient will increase Berg Balance score to 56/56 to demonstrate decreased fall risk during functional activities Baseline: 55/56 Goal status: INITIAL/REVISED  4.  Patient  will increase 10 meter walk test to >1.31m/s as to improve gait speed for better community ambulation and to reduce fall risk. Baseline: 0.8 m/s Goal status: INITIAL  5.  Patient will increase R proximal LE strength to at least 4+/5 on MMT for improved gait ability Baseline: RLE grossly 4/5 proximal Goal status: INITIAL  6.  Patient will increase six minute walk test distance to >1000 for progression to community ambulator and improve gait ability  Baseline: 754 ft no AD Goal status: INITIAL   7. Patient will increase ABC scale score >80% to demonstrate better functional mobility and better confidence with ADLs.   Baseline:  Goal status: INITIAL   ASSESSMENT:  CLINICAL IMPRESSION: Pt has now advanced to completing majority of interventions while ambulating or standing. This indicates improved activity tolerance and endurance. HRs achieved optimal training range. PT reviewed how to safely advance treadmill training at home.The pt will benefit from further skilled PT to address deficits in order to improve strength, endurance, balance, and functional mobility to increase QOL and decrease fall risk.  OBJECTIVE IMPAIRMENTS: Abnormal gait, decreased activity tolerance, decreased balance, decreased endurance, decreased mobility, difficulty walking, decreased strength, increased edema, improper body mechanics,  postural dysfunction, and pain.   ACTIVITY LIMITATIONS: lifting, squatting, transfers, and locomotion level  PARTICIPATION LIMITATIONS: cleaning, shopping, community activity, and yard work  PERSONAL FACTORS: Age and 3+ comorbidities: PMH significant for CA, mets to liver, HF, hx of 2 knee replacements (years ago), a-fib, AAA, basal cell carcinoma, chronic kidney disease, diverticulitis, HTN, melanoma, OA, sleep apnea, vitamin b12 deficiency, chemo 07/2023  are also affecting patient's functional outcome.   REHAB POTENTIAL: Good  CLINICAL DECISION MAKING: Evolving/moderate complexity  EVALUATION COMPLEXITY: Moderate  PLAN:  PT FREQUENCY: 1-2x/week  PT DURATION: 12 weeks  PLANNED INTERVENTIONS: [id number]- PT Re-evaluation, 97110-Therapeutic exercises, 97530- Therapeutic activity, 97112- Neuromuscular re-education, 97535- Self Care, [id number]- Manual therapy, [phone]- Gait training, [phone]- Orthotic Fit/training, Patient/Family education, Balance training, Stair training, Joint mobilization, Spinal mobilization, Vestibular training, DME instructions, Cryotherapy, and Moist heat  PLAN FOR NEXT SESSION: endurance/cardio, strength, higher level balance   Baird Kay, PT 01/14/2024, 2:57 PM

## 2024-01-16 ENCOUNTER — Ambulatory Visit: Payer: Medicare Other

## 2024-01-16 DIAGNOSIS — M6281 Muscle weakness (generalized): Secondary | ICD-10-CM | POA: Diagnosis not present

## 2024-01-16 DIAGNOSIS — R262 Difficulty in walking, not elsewhere classified: Secondary | ICD-10-CM

## 2024-01-16 DIAGNOSIS — R2681 Unsteadiness on feet: Secondary | ICD-10-CM

## 2024-01-16 NOTE — Therapy (Signed)
 OUTPATIENT PHYSICAL THERAPY NEURO TREATMENT /Physical Therapy Progress Note   Dates of reporting period  12/12/23   to   01/16/24     Patient Name: Alexander Howe MRN: [id number] DOB:01-05-1946, 78 y.o., male Today's Date: 01/16/2024   PCP: [name], MD  REFERRING PROVIDER:  [name], MD     END OF SESSION:  PT End of Session - 01/16/24 1356     Visit Number 10    Number of Visits 25    Date for PT Re-Evaluation 03/05/24    PT Start Time 1400    PT Stop Time 1444    PT Time Calculation (min) 44 min    Equipment Utilized During Treatment Gait belt    Activity Tolerance Patient tolerated treatment well;Patient limited by fatigue    Behavior During Therapy WFL for tasks assessed/performed                  Past Medical History:  Diagnosis Date   AAA (abdominal aortic aneurysm) without rupture (HCC)    Abdominal aneurysm (HCC)    3.5 cm   Actinic keratosis 08/26/2020   right lateral chest   Anxiety    Atrial fibrillation (HCC) 05/30/2023   Basal cell carcinoma 09/18/2018   left upper back/one excised, one superficial   Basal cell carcinoma 11/28/2018   left post shoulder   Basal cell carcinoma 08/26/2020   left medial pretibial, EDC 10/2020   Basal cell carcinoma 03/15/2022   right neck, EDC   Basal cell carcinoma (BCC) 08/31/2022   Posterior base of neck superficial. 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   right lateral chest, EDC 12/09/20   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(HCC)    Venous stasis    Venous stasis    Vitamin B 12 deficiency    Past  Surgical History:  Procedure Laterality Date   BIOPSY  05/30/2023   Procedure: BIOPSY;  Surgeon: Iva Boop, MD;  Location: [facility] ENDOSCOPY;  Service: Gastroenterology;;   COLONOSCOPY W/ POLYPECTOMY     COLONOSCOPY WITH PROPOFOL N/A 04/05/2018   Procedure: COLONOSCOPY WITH PROPOFOL;  Surgeon: Scot Jun, MD;  Location: [facility] ENDOSCOPY;  Service: Endoscopy;  Laterality: N/A;   COLOSTOMY     COLOSTOMY REVERSAL     CYST REMOVAL LEG     ENDOSCOPIC RETROGRADE CHOLANGIOPANCREATOGRAPHY (ERCP) WITH PROPOFOL N/A 05/30/2023   Procedure: ENDOSCOPIC RETROGRADE CHOLANGIOPANCREATOGRAPHY (ERCP) WITH PROPOFOL;  Surgeon: Iva Boop, MD;  Location: [facility] ENDOSCOPY;  Service: Gastroenterology;  Laterality: N/A;   KNEE ARTHROPLASTY Right 12/27/2015   Procedure: COMPUTER ASSISTED TOTAL KNEE ARTHROPLASTY;  Surgeon: Donato Heinz, MD;  Location: [facility] ORS;  Service: Orthopedics;  Laterality: Right;   KNEE ARTHROSCOPY Left    LIPOMA EXCISION     right thigh   LITHOTRIPSY     PANCREATIC STENT PLACEMENT  05/30/2023   Procedure: PANCREATIC STENT PLACEMENT;  Surgeon: Iva Boop, MD;  Location: [facility]  ENDOSCOPY;  Service: Gastroenterology;;   TOTAL KNEE ARTHROPLASTY Left 10/16/2012   Patient Active Problem List   Diagnosis Date Noted   Protein-calorie malnutrition, severe 06/02/2023   Calculus of GB and bile duct w acute cholecyst w/o obst 06/02/2023   Acute pancreatitis without infection or necrosis 06/01/2023   Post-ERCP acute pancreatitis 05/31/2023   Choledocholithiasis 05/31/2023   Gastritis and gastroduodenitis 05/30/2023   Atrial fibrillation (HCC) 05/30/2023   Elevated bilirubin 05/29/2023   Generalized abdominal pain 05/29/2023   Cholelithiasis with choledocholithiasis 05/29/2023   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   Elevated LFTs 05/28/2023   OSA on CPAP 05/28/2023   Ascending aortic aneurysm (HCC) 07/26/2020   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   S/P total knee arthroplasty  12/27/2015   Major depressive disorder with single episode, in full remission (HCC) 12/23/2015   Anxiety 11/16/2015   Benign essential hypertension 11/16/2015   Depression 11/16/2015   Hypersomnia with sleep apnea 11/16/2015   Osteoarthritis 11/16/2015   Pure hypercholesterolemia 11/16/2015   Abdominal aortic aneurysm without rupture (HCC) 12/23/2014   Chronic kidney disease, stage 3 unspecified (HCC) 12/17/2014    ONSET DATE: May 27 2023  REFERRING DIAG: E44.1 (ICD-10-CM) - Mild protein-calorie malnutrition   THERAPY DIAG:  Muscle weakness (generalized)  Difficulty in walking, not elsewhere classified  Unsteadiness on feet  Rationale for Evaluation and Treatment: Rehabilitation  SUBJECTIVE:                                                                                                                                                                                             SUBJECTIVE STATEMENT:  Patient reports no aches or pains.   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 Pt reports gallbladder attack July 14th 2024, went to ED, reports they found a "stone blockage." Pt then transferred to [facility] where ERCP was performed to remove stones of common bile duct. This was complicated by pancreatitis, heart failure and new onset of a-fib. He reports MRI taken at time where they found cancer in his liver. Pt has undergone 8 sessions of chemo, he also has a cholecystostomy tube that rests on his R side and down RLE. Pt reports future plans for procedure to "cut off blood supply" to R side of his liver and to remove gallbladder. Pt also with recent R portal vein, R hepatic vein embolization 12/04/23.  Recommendation for PT came following chemical stress test, where he reports physician wants him to get stronger  before future  procedures/surgeries. He has a follow-up in about 4 weeks. Pt notes decreased gait speed, cautious with balance, and decreased strength. No falls. He has constant pain on his R side he reports is due to gallbladder. He has a treadmill at home that he has been using and he likes to walk his dog. 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   PMH significant for CA, mets to liver, HF, hx of 2 knee replacements (years ago), a-fib, AAA, basal cell carcinoma, chronic kidney disease, diverticulitis, HTN, melanoma, OA, sleep apnea, vitamin b12 deficiency, chemo 07/2023, 10/2023 cholecystostomy tube placement, 12/04/2023 R portal vein, R hepatic vein emobolization, please refer to chart for full details.    PAIN:  Are you having pain? R side pain, reports due to gallbladder pain, cholecystostomy tube placement in this region  PRECAUTIONS: Fall and Other:    cholecystostomy tube; pt with ascending thoracic aneurism under surveillance,  advised by his physician to progress PT slowly ;  RED FLAGS: Abdominal aneurysm: Yes: says has been diagnosed with 3, being followed for this     WEIGHT BEARING RESTRICTIONS: No  FALLS: Has patient fallen in last 6 months? No  LIVING ENVIRONMENT: Lives with: lives with their spouse and and dog  Lives in: House/apartment Stairs: Yes: External: 2 steps; on left going up Has following equipment at home:  reports able to sit down in his shower, has chair  PLOF: Independent  PATIENT GOALS: improve strength  OBJECTIVE:  Note: Objective measures were completed at Evaluation unless otherwise noted.  DIAGNOSTIC FINDINGS:  Via chart PET myocardial perfusion stress and rest 11/30/2023 "   FINAL COMMENTS   Non-diagnostic CT obtained for attenuation correction: Coronary artery calcifications are present. Aortic atherosclerosis.   Conclusions:.   1. Myocardial perfusion is abnormal with evidence of ischemia  in the LCX.   2.  Global/regional myocardial blood flow reserve is abnormal. There is evidence of a flow limiting stenosis in the LCX and   coronary microvascular dysfunction.   3. Normal left ventricular systolic function.      Electronically Reviewed by: Pamala Hurry   (Electronically Signed)   Final Date: 30 November 2023 17:03 "  MRI abdomen 11/18/2023 " Impression: " Impression:  1. Similar size of the enhancing hepatic segment 7 lesion measuring up to  4.5 cm, biopsy-proven adenocarcinoma. No other suspicious hepatic lesions  in the liver.  2.  Decompressed gallbladder with interval removal of the cholecystostomy  tube. Small fluid collection adjacent to/within the gallbladder neck,  possibly an intramural abscess.  3.  Previously described indeterminant 1.1 cm right renal lesion is  compatible with a simple cyst.   Electronically Reviewed by:  Consuelo Pandy, MD, [facility]  Electronically Reviewed on:  11/19/2023 10:21 AM   I have reviewed the images and concur with the above findings.   Electronically Signed by:  Gypsy Decant, MD, [facility]  Electronically Signed on:  11/19/2023 10:46 AM "   Extensive imagine history, please refer to chart for full details   COGNITION: Overall cognitive status: Within functional limits for tasks assessed   SENSATION: WNL BLE   COORDINATION: Rapid alt UE and LE WNL BLE and BUE Chin to target WNL Heel>shin WNL   EDEMA:  He reports he has some ankle swelling, reports onset "since this all began"    POSTURE:  slight fwd posture, rounded shoulders   LOWER EXTREMITY MMT:   LLE grossly 4+/5 RLE grossly 4/5  proximal, 4+/5 distal   GAIT: Gait pattern:  Decreased gait speed, observed decrease in step length, decreased trunk rotation Distance walked: , clinic distances    FUNCTIONAL TESTS:   : 0.8 m/s  5xSTS:  16 sec use of BUE   TUG: 8.25 sec   Berg: deferred   : deferred   PATIENT SURVEYS:  deferred                                                                                                                               TREATMENT DATE: 01/16/24 Physical therapy treatment session today consisted of completing assessment of goals and administration of testing as demonstrated and documented in flow sheet, treatment, and goals section of this note. Addition treatments may be found below.   HR: 60 to start   [facility] PT Assessment - 01/16/24 0001       Standardized Balance Assessment   Standardized Balance Assessment Berg Balance Test      Berg Balance Test   Sit to Stand Able to stand without using hands and stabilize independently    Standing Unsupported Able to stand safely 2 minutes    Sitting with Back Unsupported but Feet Supported on Floor or Stool Able to sit safely and securely 2 minutes    Stand to Sit Sits safely with minimal use of hands    Transfers Able to transfer safely, minor use of hands    Standing Unsupported with Eyes Closed Able to stand 10 seconds safely    Standing Unsupported with Feet Together Able to place feet together independently and stand 1 minute safely    From Standing, Reach Forward with Outstretched Arm Can reach confidently >25 cm (10")    From Standing Position, Pick up Object from Floor Able to pick up shoe safely and easily    From Standing Position, Turn to Look Behind Over each Shoulder Looks behind from both sides and weight shifts well    Turn 360 Degrees Able to turn 360 degrees safely in 4 seconds or less    Standing Unsupported, Alternately Place Feet on Step/Stool Able to stand independently and safely and complete 8 steps in 20 seconds    Standing Unsupported, One Foot in Front Able to place foot tandem independently and hold 30 seconds    Standing on One Leg Able to lift leg independently and hold > 10 seconds    Total Score 56      Functional Gait  Assessment   Gait assessed  Yes    Gait Level Surface Walks 20 ft in less than 5.5 sec, no assistive  devices, good speed, no evidence for imbalance, normal gait pattern, deviates no more than 6 in outside of the 12 in walkway width.    Change in Gait Speed Able to change speed, demonstrates mild gait deviations, deviates 6-10 in outside of the 12 in walkway width, or no gait deviations, unable to achieve a major change in  velocity, or uses a change in velocity, or uses an assistive device.    Gait with Horizontal Head Turns Performs head turns smoothly with slight change in gait velocity (eg, minor disruption to smooth gait path), deviates 6-10 in outside 12 in walkway width, or uses an assistive device.    Gait with Vertical Head Turns Performs task with slight change in gait velocity (eg, minor disruption to smooth gait path), deviates 6 - 10 in outside 12 in walkway width or uses assistive device    Gait and Pivot Turn Pivot turns safely in greater than 3 sec and stops with no loss of balance, or pivot turns safely within 3 sec and stops with mild imbalance, requires small steps to catch balance.    Step Over Obstacle Is able to step over one shoe box (4.5 in total height) without changing gait speed. No evidence of imbalance.    Gait with Narrow Base of Support Ambulates 7-9 steps.    Gait with Eyes Closed Walks 20 ft, uses assistive device, slower speed, mild gait deviations, deviates 6-10 in outside 12 in walkway width. Ambulates 20 ft in less than 9 sec but greater than 7 sec.    Ambulating Backwards Walks 20 ft, uses assistive device, slower speed, mild gait deviations, deviates 6-10 in outside 12 in walkway width.    Steps Alternating feet, must use rail.    Total Score 21               Rest breaks throughout PATIENT EDUCATION: Education details: Pt educated throughout session about proper posture and technique with exercises. Improved exercise technique, movement at target joints, use of target muscles after min to mod verbal, visual, tactile cues. Person educated:  Patient Education method: Explanation Education comprehension: verbalized understanding  HOME EXERCISE PROGRAM: Access Code: [email or web address] URL: [email or web address] Date: 12/18/2023 Prepared by: Temple Pacini  Exercises - Seated March  - 2 x daily - 5-7 x weekly - 3 sets - 10 reps - Sit to Stand with Counter Support  - 2 x daily - 5-7 x weekly - 2 sets - 5-10 reps - Standing Tandem Balance with Counter Support  - 1 x daily - 7 x weekly - 2 sets - 1 reps - 30 sec/leg hold   GOALS: Goals reviewed with patient? Yes  SHORT TERM GOALS: Target date: 01/23/2024     Patient will be independent in home exercise program to improve strength/mobility for better functional independence with ADLs. Baseline: 3/5: met Goal status: met   LONG TERM GOALS: Target date: 03/04/2024  1.  Patient (> 66 years old) will complete five times sit to stand test in < 15 seconds indicating an increased LE strength and improved balance. Baseline: 16 sec use of BUE, very fatiguing 3/5: 11.8 no hands Goal status: MET  3.  Patient will increase Berg Balance score to 56/56 to demonstrate decreased fall risk during functional activities Baseline: 55/56 3/5: 56/56 Goal status: MET  4.  Patient will increase 10 meter walk test to >1.68m/s as to improve gait speed for better community ambulation and to reduce fall risk. Baseline: 0.8 m/s 3/5: 1.45 m/s Goal status: MET  5.  Patient will increase R proximal LE strength to at least 4+/5 on MMT for improved gait ability Baseline: RLE grossly 4/5 proximal 3/5: knees 4+/5, ankles 4/5 Goal status: Partially Met  6.  Patient will increase six minute walk test distance to >1000 for progression to community ambulator and improve gait ability  Baseline:  754 ft no AD 3/5: 880 ft Goal status:  Partially Met   7. Patient will increase ABC scale score >80% to demonstrate better functional mobility and better confidence with ADLs.   Baseline: 96%  Goal status:  MET  8. Patient will increase Functional Gait Assessment score to >27/30 as to reduce fall risk and improve dynamic gait safety with community ambulation  Baseline: 3/5: 21/30   Goal status: INITIAL   ASSESSMENT:  CLINICAL IMPRESSION:  Patient has met his BERG test, increasing balance complexity to a more functional FGA goal at this time.  He also met his 10 MWT , ABC and 5xSTS goal. His strength is improving but continues to require strengthening of ankle region.Patient's condition has the potential to improve in response to therapy. Maximum improvement is yet to be obtained. The anticipated improvement is attainable and reasonable in a generally predictable time.  The pt will benefit from further skilled PT to address deficits in order to improve strength, endurance, balance, and functional mobility to increase QOL and decrease fall risk.  OBJECTIVE IMPAIRMENTS: Abnormal gait, decreased activity tolerance, decreased balance, decreased endurance, decreased mobility, difficulty walking, decreased strength, increased edema, improper body mechanics, postural dysfunction, and pain.   ACTIVITY LIMITATIONS: lifting, squatting, transfers, and locomotion level  PARTICIPATION LIMITATIONS: cleaning, shopping, community activity, and yard work  PERSONAL FACTORS: Age and 3+ comorbidities: PMH significant for CA, mets to liver, HF, hx of 2 knee replacements (years ago), a-fib, AAA, basal cell carcinoma, chronic kidney disease, diverticulitis, HTN, melanoma, OA, sleep apnea, vitamin b12 deficiency, chemo 07/2023  are also affecting patient's functional outcome.   REHAB POTENTIAL: Good  CLINICAL DECISION MAKING: Evolving/moderate complexity  EVALUATION COMPLEXITY: Moderate  PLAN:  PT FREQUENCY: 1-2x/week  PT DURATION: 12 weeks  PLANNED INTERVENTIONS: 97164- PT Re-evaluation, 97110-Therapeutic exercises, 97530- Therapeutic activity, 97112- Neuromuscular re-education, 97535- Self Care, [id number]- Manual  therapy, [phone]- Gait training, [phone]- Orthotic Fit/training, Patient/Family education, Balance training, Stair training, Joint mobilization, Spinal mobilization, Vestibular training, DME instructions, Cryotherapy, and Moist heat  PLAN FOR NEXT SESSION: endurance/cardio, strength, higher level balance   Precious Bard, PT 01/16/2024, 2:48 PM

## 2024-01-21 ENCOUNTER — Ambulatory Visit: Payer: Medicare Other

## 2024-01-21 DIAGNOSIS — M6281 Muscle weakness (generalized): Secondary | ICD-10-CM

## 2024-01-21 DIAGNOSIS — R262 Difficulty in walking, not elsewhere classified: Secondary | ICD-10-CM

## 2024-01-21 NOTE — Therapy (Signed)
 OUTPATIENT PHYSICAL THERAPY NEURO TREATMENT      Patient Name: Alexander Howe MRN: [id number] DOB:02/26/1946, 78 y.o., male Today's Date: 01/21/2024   PCP: [name], MD  REFERRING PROVIDER:  [name], MD     END OF SESSION:  PT End of Session - 01/21/24 1313     Visit Number 11    Number of Visits 25    Date for PT Re-Evaluation 03/05/24    PT Start Time 1316    PT Stop Time 1358    PT Time Calculation (min) 42 min    Equipment Utilized During Treatment Gait belt    Activity Tolerance Patient tolerated treatment well;Patient limited by fatigue    Behavior During Therapy WFL for tasks assessed/performed                  Past Medical History:  Diagnosis Date   AAA (abdominal aortic aneurysm) without rupture (HCC)    Abdominal aneurysm (HCC)    3.5 cm   Actinic keratosis 08/26/2020   right lateral chest   Anxiety    Atrial fibrillation (HCC) 05/30/2023   Basal cell carcinoma 09/18/2018   left upper back/one excised, one superficial   Basal cell carcinoma 11/28/2018   left post shoulder   Basal cell carcinoma 08/26/2020   left medial pretibial, EDC 10/2020   Basal cell carcinoma 03/15/2022   right neck, EDC   Basal cell carcinoma (BCC) 08/31/2022   Posterior base of neck superficial. 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   right lateral chest, EDC 12/09/20   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(HCC)    Venous stasis    Venous stasis    Vitamin B 12 deficiency    Past Surgical History:  Procedure Laterality Date   BIOPSY  05/30/2023   Procedure: BIOPSY;   Surgeon: Iva Boop, MD;  Location: [facility] ENDOSCOPY;  Service: Gastroenterology;;   COLONOSCOPY W/ POLYPECTOMY     COLONOSCOPY WITH PROPOFOL N/A 04/05/2018   Procedure: COLONOSCOPY WITH PROPOFOL;  Surgeon: Scot Jun, MD;  Location: [facility] ENDOSCOPY;  Service: Endoscopy;  Laterality: N/A;   COLOSTOMY     COLOSTOMY REVERSAL     CYST REMOVAL LEG     ENDOSCOPIC RETROGRADE CHOLANGIOPANCREATOGRAPHY (ERCP) WITH PROPOFOL N/A 05/30/2023   Procedure: ENDOSCOPIC RETROGRADE CHOLANGIOPANCREATOGRAPHY (ERCP) WITH PROPOFOL;  Surgeon: Iva Boop, MD;  Location: [facility] ENDOSCOPY;  Service: Gastroenterology;  Laterality: N/A;   KNEE ARTHROPLASTY Right 12/27/2015   Procedure: COMPUTER ASSISTED TOTAL KNEE ARTHROPLASTY;  Surgeon: Donato Heinz, MD;  Location: [facility] ORS;  Service: Orthopedics;  Laterality: Right;   KNEE ARTHROSCOPY Left    LIPOMA EXCISION     right thigh   LITHOTRIPSY     PANCREATIC STENT PLACEMENT  05/30/2023   Procedure: PANCREATIC STENT PLACEMENT;  Surgeon: Iva Boop, MD;  Location: [facility] ENDOSCOPY;  Service: Gastroenterology;;   TOTAL KNEE ARTHROPLASTY Left 10/16/2012   Patient Active Problem List  Diagnosis Date Noted   Protein-calorie malnutrition, severe 06/02/2023   Calculus of GB and bile duct w acute cholecyst w/o obst 06/02/2023   Acute pancreatitis without infection or necrosis 06/01/2023   Post-ERCP acute pancreatitis 05/31/2023   Choledocholithiasis 05/31/2023   Gastritis and gastroduodenitis 05/30/2023   Atrial fibrillation (HCC) 05/30/2023   Elevated bilirubin 05/29/2023   Generalized abdominal pain 05/29/2023   Cholelithiasis with choledocholithiasis 05/29/2023   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   Elevated LFTs 05/28/2023   OSA on CPAP 05/28/2023   Ascending aortic aneurysm (HCC) 07/26/2020   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   S/P total knee arthroplasty 12/27/2015   Major depressive disorder with single episode, in full remission (HCC)  12/23/2015   Anxiety 11/16/2015   Benign essential hypertension 11/16/2015   Depression 11/16/2015   Hypersomnia with sleep apnea 11/16/2015   Osteoarthritis 11/16/2015   Pure hypercholesterolemia 11/16/2015   Abdominal aortic aneurysm without rupture (HCC) 12/23/2014   Chronic kidney disease, stage 3 unspecified (HCC) 12/17/2014    ONSET DATE: May 27 2023  REFERRING DIAG: E44.1 (ICD-10-CM) - Mild protein-calorie malnutrition   THERAPY DIAG:  Muscle weakness (generalized)  Difficulty in walking, not elsewhere classified  Rationale for Evaluation and Treatment: Rehabilitation  SUBJECTIVE:                                                                                                                                                                                             SUBJECTIVE STATEMENT:  Patient reports some R hip discomfort due to the way he slept. Energy level is moderate. HEP at home has been good, walked a lot. Used the treadmill some and increased his speed.  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 Pt reports gallbladder attack July 14th 2024, went to ED, reports they found a "stone blockage." Pt then transferred to [facility] where ERCP was performed to remove stones of common bile duct. This was complicated by pancreatitis, heart failure and new onset of a-fib. He reports MRI taken at time where they found cancer in his liver. Pt has undergone 8 sessions of chemo, he also has a cholecystostomy tube that rests on his R side and down RLE. Pt reports future plans for procedure to "cut off blood supply" to R side of his liver and to remove gallbladder. Pt also with recent R portal vein, R hepatic vein embolization 12/04/23.  Recommendation for PT came following chemical stress test, where he reports physician wants him  to get stronger  before future procedures/surgeries. He has a follow-up in about 4 weeks. Pt notes decreased gait speed, cautious with balance, and decreased strength. No falls. He has constant pain on his R side he reports is due to gallbladder. He has a treadmill at home that he has been using and he likes to walk his dog. 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   PMH significant for CA, mets to liver, HF, hx of 2 knee replacements (years ago), a-fib, AAA, basal cell carcinoma, chronic kidney disease, diverticulitis, HTN, melanoma, OA, sleep apnea, vitamin b12 deficiency, chemo 07/2023, 10/2023 cholecystostomy tube placement, 12/04/2023 R portal vein, R hepatic vein emobolization, please refer to chart for full details.    PAIN:  Are you having pain? R side pain, reports due to gallbladder pain, cholecystostomy tube placement in this region  PRECAUTIONS: Fall and Other:    cholecystostomy tube; pt with ascending thoracic aneurism under surveillance,  advised by his physician to progress PT slowly ;  RED FLAGS: Abdominal aneurysm: Yes: says has been diagnosed with 3, being followed for this     WEIGHT BEARING RESTRICTIONS: No  FALLS: Has patient fallen in last 6 months? No  LIVING ENVIRONMENT: Lives with: lives with their spouse and and dog  Lives in: House/apartment Stairs: Yes: External: 2 steps; on left going up Has following equipment at home:  reports able to sit down in his shower, has chair  PLOF: Independent  PATIENT GOALS: improve strength  OBJECTIVE:  Note: Objective measures were completed at Evaluation unless otherwise noted.  DIAGNOSTIC FINDINGS:  Via chart PET myocardial perfusion stress and rest 11/30/2023 "   FINAL COMMENTS   Non-diagnostic CT obtained for attenuation correction: Coronary artery calcifications are present. Aortic atherosclerosis.   Conclusions:.   1. Myocardial perfusion is abnormal with evidence of ischemia  in the LCX.    2. Global/regional myocardial blood flow reserve is abnormal. There is evidence of a flow limiting stenosis in the LCX and   coronary microvascular dysfunction.   3. Normal left ventricular systolic function.      Electronically Reviewed by: Pamala Hurry   (Electronically Signed)   Final Date: 30 November 2023 17:03 "  MRI abdomen 11/18/2023 " Impression: " Impression:  1. Similar size of the enhancing hepatic segment 7 lesion measuring up to  4.5 cm, biopsy-proven adenocarcinoma. No other suspicious hepatic lesions  in the liver.  2.  Decompressed gallbladder with interval removal of the cholecystostomy  tube. Small fluid collection adjacent to/within the gallbladder neck,  possibly an intramural abscess.  3.  Previously described indeterminant 1.1 cm right renal lesion is  compatible with a simple cyst.   Electronically Reviewed by:  Consuelo Pandy, MD, [facility]  Electronically Reviewed on:  11/19/2023 10:21 AM   I have reviewed the images and concur with the above findings.   Electronically Signed by:  Gypsy Decant, MD, [facility]  Electronically Signed on:  11/19/2023 10:46 AM "   Extensive imagine history, please refer to chart for full details   COGNITION: Overall cognitive status: Within functional limits for tasks assessed   SENSATION: WNL BLE   COORDINATION: Rapid alt UE and LE WNL BLE and BUE Chin to target WNL Heel>shin WNL   EDEMA:  He reports he has some ankle swelling, reports onset "since this all began"    POSTURE:  slight fwd posture, rounded shoulders   LOWER EXTREMITY MMT:   LLE grossly 4+/5  RLE grossly 4/5 proximal, 4+/5 distal   GAIT: Gait pattern:  Decreased gait speed, observed decrease in step length, decreased trunk rotation Distance walked: , clinic distances    FUNCTIONAL TESTS:   : 0.8 m/s  5xSTS:  16 sec use of BUE   TUG: 8.25 sec   Berg: deferred   : deferred   PATIENT SURVEYS:  deferred                                                                                                                               TREATMENT DATE: 01/21/24  TA: to promote ease with transfers, gait, to promote strength, endurance and cardio-performance   Treadmill 6 minutes up to 1.2 mph, no elevation - HR 70s-80s bpm  STS 2x10 - Rates moderate  - hands-free   Ambulation focus on endurance and cardio performance with 3# aw each LE, SBA 1x296 ft - pt rates medium  With 3# aw each LE: Seated heel raise 2x15 Seated DF 2x15  LAQ 8x, 5x each LE - hard  Seated march 10x, 6x - rates moderate  STS 2x6-8  Ambulation focus on endurance and cardio performance with 3# aw each LE, SBA 2x148 ft   At support surface - Standing hip abduction 2x10 each LE - rates medium  Standing hamstring curl 2x10 each LE - rates easy-medium  Standing heel raise 2x15 bilat LE easy-moderate   Rest breaks provided throughout   Reports hip feels better at end of session  PATIENT EDUCATION: Education details: Pt educated throughout session about proper posture and technique with exercises. Improved exercise technique, movement at target joints, use of target muscles after min to mod verbal, visual, tactile cues. Person educated: Patient Education method: Explanation Education comprehension: verbalized understanding  HOME EXERCISE PROGRAM: Access Code: [email or web address] URL: [email or web address] Date: 12/18/2023 Prepared by: Temple Pacini  Exercises - Seated March  - 2 x daily - 5-7 x weekly - 3 sets - 10 reps - Sit to Stand with Counter Support  - 2 x daily - 5-7 x weekly - 2 sets - 5-10 reps - Standing Tandem Balance with Counter Support  - 1 x daily - 7 x weekly - 2 sets - 1 reps - 30 sec/leg hold   GOALS: Goals reviewed with patient? Yes  SHORT TERM GOALS: Target date: 01/23/2024     Patient will be independent in home exercise program to improve strength/mobility for better functional independence with  ADLs. Baseline: 3/5: met Goal status: met   LONG TERM GOALS: Target date: 03/04/2024  1.  Patient (> 78 years old) will complete five times sit to stand test in < 15 seconds indicating an increased LE strength and improved balance. Baseline: 16 sec use of BUE, very fatiguing 3/5: 11.8 no hands Goal status: MET  3.  Patient will increase Berg Balance score to 56/56 to demonstrate decreased fall risk during functional activities Baseline: 55/56 3/5: 56/56 Goal status: MET  4.  Patient will increase 10 meter walk test to >1.62m/s as to improve gait speed for better community ambulation and to reduce fall risk. Baseline: 0.8 m/s 3/5: 1.45 m/s Goal status: MET  5.  Patient will increase R proximal LE strength to at least 4+/5 on MMT for improved gait ability Baseline: RLE grossly 4/5 proximal 3/5: knees 4+/5, ankles 4/5 Goal status: Partially Met  6.  Patient will increase six minute walk test distance to >1000 for progression to community ambulator and improve gait ability  Baseline: 754 ft no AD 3/5: 880 ft Goal status:  Partially Met   7. Patient will increase ABC scale score >80% to demonstrate better functional mobility and better confidence with ADLs.   Baseline: 96%  Goal status: MET  8. Patient will increase Functional Gait Assessment score to >27/30 as to reduce fall risk and improve dynamic gait safety with community ambulation  Baseline: 3/5: 21/30   Goal status: INITIAL   ASSESSMENT:  CLINICAL IMPRESSION: Pt continues to make gains and is able to advance treadmill training to higher mph with lower HR response. This indicates adaption to activity. Pt also able to tolerate use of heavier weights without requiring increased rest breaks.  The pt will benefit from further skilled PT to address deficits in order to improve strength, endurance, balance, and functional mobility to increase QOL and decrease fall risk.  OBJECTIVE IMPAIRMENTS: Abnormal gait, decreased activity  tolerance, decreased balance, decreased endurance, decreased mobility, difficulty walking, decreased strength, increased edema, improper body mechanics, postural dysfunction, and pain.   ACTIVITY LIMITATIONS: lifting, squatting, transfers, and locomotion level  PARTICIPATION LIMITATIONS: cleaning, shopping, community activity, and yard work  PERSONAL FACTORS: Age and 3+ comorbidities: PMH significant for CA, mets to liver, HF, hx of 2 knee replacements (years ago), a-fib, AAA, basal cell carcinoma, chronic kidney disease, diverticulitis, HTN, melanoma, OA, sleep apnea, vitamin b12 deficiency, chemo 07/2023  are also affecting patient's functional outcome.   REHAB POTENTIAL: Good  CLINICAL DECISION MAKING: Evolving/moderate complexity  EVALUATION COMPLEXITY: Moderate  PLAN:  PT FREQUENCY: 1-2x/week  PT DURATION: 12 weeks  PLANNED INTERVENTIONS: [id number]- PT Re-evaluation, 97110-Therapeutic exercises, 97530- Therapeutic activity, 97112- Neuromuscular re-education, 97535- Self Care, 96045- Manual therapy, [phone]- Gait training, [phone]- Orthotic Fit/training, Patient/Family education, Balance training, Stair training, Joint mobilization, Spinal mobilization, Vestibular training, DME instructions, Cryotherapy, and Moist heat  PLAN FOR NEXT SESSION: endurance/cardio, strength, higher level balance   Baird Kay, PT 01/21/2024, 2:03 PM

## 2024-01-23 ENCOUNTER — Ambulatory Visit: Payer: Medicare Other

## 2024-01-23 DIAGNOSIS — R262 Difficulty in walking, not elsewhere classified: Secondary | ICD-10-CM

## 2024-01-23 DIAGNOSIS — M6281 Muscle weakness (generalized): Secondary | ICD-10-CM

## 2024-01-23 NOTE — Therapy (Signed)
 OUTPATIENT PHYSICAL THERAPY NEURO TREATMENT      Patient Name: Alexander Howe MRN: [id number] DOB:[birth year], 78 y.o., male Today's Date: 01/23/2024   PCP: [name], MD  REFERRING PROVIDER:  [name], MD     END OF SESSION:  PT End of Session - 01/23/24 1054     Visit Number 12    Number of Visits 25    Date for PT Re-Evaluation 03/05/24    PT Start Time 1058    PT Stop Time 1142    PT Time Calculation (min) 44 min    Equipment Utilized During Treatment Gait belt    Activity Tolerance Patient tolerated treatment well;Patient limited by fatigue    Behavior During Therapy WFL for tasks assessed/performed                  Past Medical History:  Diagnosis Date   AAA (abdominal aortic aneurysm) without rupture (HCC)    Abdominal aneurysm (HCC)    3.5 cm   Actinic keratosis 08/26/2020   right lateral chest   Anxiety    Atrial fibrillation (HCC) 05/30/2023   Basal cell carcinoma 09/18/2018   left upper back/one excised, one superficial   Basal cell carcinoma 11/28/2018   left post shoulder   Basal cell carcinoma 08/26/2020   left medial pretibial, EDC 10/2020   Basal cell carcinoma 03/15/2022   right neck, EDC   Basal cell carcinoma (BCC) 08/31/2022   Posterior base of neck superficial. 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   right lateral chest, EDC 12/09/20   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(HCC)    Venous stasis    Venous stasis    Vitamin B 12 deficiency    Past Surgical History:  Procedure Laterality Date   BIOPSY  05/30/2023   Procedure: BIOPSY;   Surgeon: Iva Boop, MD;  Location: [facility] ENDOSCOPY;  Service: Gastroenterology;;   COLONOSCOPY W/ POLYPECTOMY     COLONOSCOPY WITH PROPOFOL N/A 04/05/2018   Procedure: COLONOSCOPY WITH PROPOFOL;  Surgeon: Scot Jun, MD;  Location: [facility] ENDOSCOPY;  Service: Endoscopy;  Laterality: N/A;   COLOSTOMY     COLOSTOMY REVERSAL     CYST REMOVAL LEG     ENDOSCOPIC RETROGRADE CHOLANGIOPANCREATOGRAPHY (ERCP) WITH PROPOFOL N/A 05/30/2023   Procedure: ENDOSCOPIC RETROGRADE CHOLANGIOPANCREATOGRAPHY (ERCP) WITH PROPOFOL;  Surgeon: Iva Boop, MD;  Location: [facility] ENDOSCOPY;  Service: Gastroenterology;  Laterality: N/A;   KNEE ARTHROPLASTY Right 12/27/2015   Procedure: COMPUTER ASSISTED TOTAL KNEE ARTHROPLASTY;  Surgeon: Donato Heinz, MD;  Location: [facility] ORS;  Service: Orthopedics;  Laterality: Right;   KNEE ARTHROSCOPY Left    LIPOMA EXCISION     right thigh   LITHOTRIPSY     PANCREATIC STENT PLACEMENT  05/30/2023   Procedure: PANCREATIC STENT PLACEMENT;  Surgeon: Iva Boop, MD;  Location: [facility] ENDOSCOPY;  Service: Gastroenterology;;   TOTAL KNEE ARTHROPLASTY Left 10/16/2012   Patient Active Problem List  Diagnosis Date Noted   Protein-calorie malnutrition, severe 06/02/2023   Calculus of GB and bile duct w acute cholecyst w/o obst 06/02/2023   Acute pancreatitis without infection or necrosis 06/01/2023   Post-ERCP acute pancreatitis 05/31/2023   Choledocholithiasis 05/31/2023   Gastritis and gastroduodenitis 05/30/2023   Atrial fibrillation (HCC) 05/30/2023   Elevated bilirubin 05/29/2023   Generalized abdominal pain 05/29/2023   Cholelithiasis with choledocholithiasis 05/29/2023   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   Elevated LFTs 05/28/2023   OSA on CPAP 05/28/2023   Ascending aortic aneurysm (HCC) 07/26/2020   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   S/P total knee arthroplasty 12/27/2015   Major depressive disorder with single episode, in full remission (HCC)  12/23/2015   Anxiety 11/16/2015   Benign essential hypertension 11/16/2015   Depression 11/16/2015   Hypersomnia with sleep apnea 11/16/2015   Osteoarthritis 11/16/2015   Pure hypercholesterolemia 11/16/2015   Abdominal aortic aneurysm without rupture (HCC) 12/23/2014   Chronic kidney disease, stage 3 unspecified (HCC) 12/17/2014    ONSET DATE: May 27 2023  REFERRING DIAG: E44.1 (ICD-10-CM) - Mild protein-calorie malnutrition   THERAPY DIAG:  Muscle weakness (generalized)  Difficulty in walking, not elsewhere classified  Rationale for Evaluation and Treatment: Rehabilitation  SUBJECTIVE:                                                                                                                                                                                             SUBJECTIVE STATEMENT:  Pt feeling good currently. Pt reports continued R hip discomfort.  Pt reports no stumbles/falls.   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 Pt reports gallbladder attack July 14th 2024, went to ED, reports they found a "stone blockage." Pt then [facility] where ERCP was performed to remove stones of common bile duct. This was complicated by pancreatitis, heart failure and new onset of a-fib. He reports MRI taken at time where they found cancer in his liver. Pt has undergone 8 sessions of chemo, he also has a cholecystostomy tube that rests on his R side and down RLE. Pt reports future plans for procedure to "cut off blood supply" to R side of his liver and to remove gallbladder. Pt also with recent R portal vein, R hepatic vein embolization 12/04/23.  Recommendation for PT came following chemical stress test, where he reports physician wants him to get stronger before future procedures/surgeries. He has a follow-up in about 4 weeks. Pt notes  decreased gait speed, cautious with balance, and decreased strength. No falls. He has constant pain on his R side he reports is due to gallbladder. He has a treadmill at home that he has been using and he likes to walk his dog. 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   PMH significant for CA, mets to liver, HF, hx of 2 knee replacements (years ago), a-fib, AAA, basal cell carcinoma, chronic kidney disease, diverticulitis, HTN, melanoma, OA, sleep apnea, vitamin b12 deficiency, chemo 07/2023, 10/2023 cholecystostomy tube placement, 12/04/2023 R portal vein, R hepatic vein emobolization, please refer to chart for full details.    PAIN:  Are you having pain? R side pain, reports due to gallbladder pain, cholecystostomy tube placement in this region  PRECAUTIONS: Fall and Other:    cholecystostomy tube; pt with ascending thoracic aneurism under surveillance,  advised by his physician to progress PT slowly ;  RED FLAGS: Abdominal aneurysm: Yes: says has been diagnosed with 3, being followed for this     WEIGHT BEARING RESTRICTIONS: No  FALLS: Has patient fallen in last 6 months? No  LIVING ENVIRONMENT: Lives with: lives with their spouse and and dog  Lives in: House/apartment Stairs: Yes: External: 2 steps; on left going up Has following equipment at home:  reports able to sit down in his shower, has chair  PLOF: Independent  PATIENT GOALS: improve strength  OBJECTIVE:  Note: Objective measures were completed at Evaluation unless otherwise noted.  DIAGNOSTIC FINDINGS:  Via chart PET myocardial perfusion stress and rest 11/30/2023 "   FINAL COMMENTS   Non-diagnostic CT obtained for attenuation correction: Coronary artery calcifications are present. Aortic atherosclerosis.   Conclusions:.   1. Myocardial perfusion is abnormal with evidence of ischemia  in the LCX.   2. Global/regional myocardial blood flow reserve is abnormal. There is evidence of  a flow limiting stenosis in the LCX and   coronary microvascular dysfunction.   3. Normal left ventricular systolic function.      Electronically Reviewed by: Pamala Hurry   (Electronically Signed)   Final Date: 30 November 2023 17:03 "  MRI abdomen 11/18/2023 " Impression: " Impression:  1. Similar size of the enhancing hepatic segment 7 lesion measuring up to  4.5 cm, biopsy-proven adenocarcinoma. No other suspicious hepatic lesions  in the liver.  2.  Decompressed gallbladder with interval removal of the cholecystostomy  tube. Small fluid collection adjacent to/within the gallbladder neck,  possibly an intramural abscess.  3.  Previously described indeterminant 1.1 cm right renal lesion is  compatible with a simple cyst.   Electronically Reviewed by:  Consuelo Pandy, MD, [facility]  Electronically Reviewed on:  11/19/2023 10:21 AM   I have reviewed the images and concur with the above findings.   Electronically Signed by:  Gypsy Decant, MD, [facility]  Electronically Signed on:  11/19/2023 10:46 AM "   Extensive imagine history, please refer to chart for full details   COGNITION: Overall cognitive status: Within functional limits for tasks assessed   SENSATION: WNL BLE   COORDINATION: Rapid alt UE and LE WNL BLE and BUE Chin to target WNL Heel>shin WNL   EDEMA:  He reports he has some ankle swelling, reports onset "since this all began"    POSTURE:  slight fwd posture, rounded shoulders   LOWER EXTREMITY MMT:   LLE grossly 4+/5 RLE grossly 4/5 proximal, 4+/5 distal   GAIT: Gait pattern:  Decreased gait speed, observed decrease  in step length, decreased trunk rotation Distance walked: , clinic distances    FUNCTIONAL TESTS:   : 0.8 m/s  5xSTS:  16 sec use of BUE   TUG: 8.25 sec   Berg: see goal section   : see goal section   PATIENT SURVEYS:  See goal section                                                                                                                               TREATMENT DATE: 01/23/24  TA: to promote ease with transfers, gait, to promote strength, endurance and cardio-performance   Treadmill x total 8 minutes up to 1.6 mph, no elevation - HR in 70s. Rates moderate.   STS 2x12 - Rates moderate  - hands-free HR reaches 80 bpm    With 3# aw each LE: Seated heel raise 2x20 bilat LE Seated DF 2x20 bilat LE LAQ 8x, 5x each LE - moderate   Seated march 10x, 6x - rates moderate  Ambulation focus on endurance and cardio performance with 3# aw each LE, SBA 1x296 ft - pt rates medium + HR 80s  STS 1x6, 1x4  Step ups onto 6' stair step 10x each LE with BUE support rates easy +   Ambulation focus on endurance and cardio performance SBA x296 ft   Rest breaks provided throughout    PATIENT EDUCATION: Education details: Pt educated throughout session about proper posture and technique with exercises. Improved exercise technique, movement at target joints, use of target muscles after min to mod verbal, visual, tactile cues. Person educated: Patient Education method: Archivist, demo Education comprehension: verbalized understanding  HOME EXERCISE PROGRAM: Access Code: [email or web address] URL: [email or web address] Date: 12/18/2023 Prepared by: Temple Pacini  Exercises - Seated March  - 2 x daily - 5-7 x weekly - 3 sets - 10 reps - Sit to Stand with Counter Support  - 2 x daily - 5-7 x weekly - 2 sets - 5-10 reps - Standing Tandem Balance with Counter Support  - 1 x daily - 7 x weekly - 2 sets - 1 reps - 30 sec/leg hold   GOALS: Goals reviewed with patient? Yes  SHORT TERM GOALS: Target date: 01/23/2024     Patient will be independent in home exercise program to improve strength/mobility for better functional independence with ADLs. Baseline: 3/5: met Goal status: met   LONG TERM GOALS: Target date: 03/04/2024  1.  Patient (> 9 years old) will complete five times sit to  stand test in < 15 seconds indicating an increased LE strength and improved balance. Baseline: 16 sec use of BUE, very fatiguing 3/5: 11.8 no hands Goal status: MET  3.  Patient will increase Berg Balance score to 56/56 to demonstrate decreased fall risk during functional activities Baseline: 55/56 3/5: 56/56 Goal status: MET  4.  Patient will increase 10 meter walk test to >1.62m/s as to improve gait speed for better community ambulation and  to reduce fall risk. Baseline: 0.8 m/s 3/5: 1.45 m/s Goal status: MET  5.  Patient will increase R proximal LE strength to at least 4+/5 on MMT for improved gait ability Baseline: RLE grossly 4/5 proximal 3/5: knees 4+/5, ankles 4/5 Goal status: Partially Met  6.  Patient will increase six minute walk test distance to >1000 for progression to community ambulator and improve gait ability  Baseline: 754 ft no AD 3/5: 880 ft Goal status:  Partially Met   7. Patient will increase ABC scale score >80% to demonstrate better functional mobility and better confidence with ADLs.   Baseline: 96%  Goal status: MET  8. Patient will increase Functional Gait Assessment score to >27/30 as to reduce fall risk and improve dynamic gait safety with community ambulation  Baseline: 3/5: 21/30   Goal status: INITIAL   ASSESSMENT:  CLINICAL IMPRESSION: Pt continues to demonstrate gains toward goals, with improved cardio performance on treadmill. Pt has one more PT visit prior to upcoming surgery. Plan to complete , 5xSTS and next session. The pt will benefit from further skilled PT to address deficits in order to improve strength, endurance, balance, and functional mobility to increase QOL and decrease fall risk.  OBJECTIVE IMPAIRMENTS: Abnormal gait, decreased activity tolerance, decreased balance, decreased endurance, decreased mobility, difficulty walking, decreased strength, increased edema, improper body mechanics, postural dysfunction, and pain.    ACTIVITY LIMITATIONS: lifting, squatting, transfers, and locomotion level  PARTICIPATION LIMITATIONS: cleaning, shopping, community activity, and yard work  PERSONAL FACTORS: Age and 3+ comorbidities: PMH significant for CA, mets to liver, HF, hx of 2 knee replacements (years ago), a-fib, AAA, basal cell carcinoma, chronic kidney disease, diverticulitis, HTN, melanoma, OA, sleep apnea, vitamin b12 deficiency, chemo 07/2023  are also affecting patient's functional outcome.   REHAB POTENTIAL: Good  CLINICAL DECISION MAKING: Evolving/moderate complexity  EVALUATION COMPLEXITY: Moderate  PLAN:  PT FREQUENCY: 1-2x/week  PT DURATION: 12 weeks  PLANNED INTERVENTIONS: 97164- PT Re-evaluation, 97110-Therapeutic exercises, 97530- Therapeutic activity, 97112- Neuromuscular re-education, 97535- Self Care, [id number]- Manual therapy, [phone]- Gait training, [phone]- Orthotic Fit/training, Patient/Family education, Balance training, Stair training, Joint mobilization, Spinal mobilization, Vestibular training, DME instructions, Cryotherapy, and Moist heat  PLAN FOR NEXT SESSION: endurance/cardio, strength, higher level balance   Baird Kay, PT 01/23/2024, 12:27 PM

## 2024-01-28 ENCOUNTER — Ambulatory Visit: Payer: Medicare Other

## 2024-01-30 ENCOUNTER — Encounter: Payer: Self-pay | Admitting: Gastroenterology

## 2024-01-30 ENCOUNTER — Ambulatory Visit: Payer: Medicare Other

## 2024-01-30 DIAGNOSIS — R262 Difficulty in walking, not elsewhere classified: Secondary | ICD-10-CM

## 2024-01-30 DIAGNOSIS — M6281 Muscle weakness (generalized): Secondary | ICD-10-CM

## 2024-01-30 NOTE — Therapy (Signed)
 OUTPATIENT PHYSICAL THERAPY NEURO TREATMENT/DISCHARGE     Patient Name: Alexander Howe MRN: [id number] DOB:Mar 21, 1946, 78 y.o., male Today's Date: 01/30/2024   PCP: [name], MD  REFERRING PROVIDER:  [name], MD     END OF SESSION:  PT End of Session - 01/30/24 1103     Visit Number 13    Number of Visits 25    Date for PT Re-Evaluation 03/05/24    PT Start Time 1103    PT Stop Time 1146    PT Time Calculation (min) 43 min    Equipment Utilized During Treatment Gait belt    Activity Tolerance Patient tolerated treatment well;Patient limited by fatigue    Behavior During Therapy WFL for tasks assessed/performed                   Past Medical History:  Diagnosis Date   AAA (abdominal aortic aneurysm) without rupture (HCC)    Abdominal aneurysm (HCC)    3.5 cm   Actinic keratosis 08/26/2020   right lateral chest   Anxiety    Atrial fibrillation (HCC) 05/30/2023   Basal cell carcinoma 09/18/2018   left upper back/one excised, one superficial   Basal cell carcinoma 11/28/2018   left post shoulder   Basal cell carcinoma 08/26/2020   left medial pretibial, EDC 10/2020   Basal cell carcinoma 03/15/2022   right neck, EDC   Basal cell carcinoma (BCC) 08/31/2022   Posterior base of neck superficial. 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   right lateral chest, EDC 12/09/20   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(HCC)    Venous stasis    Venous stasis    Vitamin B 12 deficiency    Past Surgical History:  Procedure Laterality Date   BIOPSY  05/30/2023   Procedure:  BIOPSY;  Surgeon: Iva Boop, MD;  Location: [facility] ENDOSCOPY;  Service: Gastroenterology;;   COLONOSCOPY W/ POLYPECTOMY     COLONOSCOPY WITH PROPOFOL N/A 04/05/2018   Procedure: COLONOSCOPY WITH PROPOFOL;  Surgeon: Scot Jun, MD;  Location: [facility] ENDOSCOPY;  Service: Endoscopy;  Laterality: N/A;   COLOSTOMY     COLOSTOMY REVERSAL     CYST REMOVAL LEG     ENDOSCOPIC RETROGRADE CHOLANGIOPANCREATOGRAPHY (ERCP) WITH PROPOFOL N/A 05/30/2023   Procedure: ENDOSCOPIC RETROGRADE CHOLANGIOPANCREATOGRAPHY (ERCP) WITH PROPOFOL;  Surgeon: Iva Boop, MD;  Location: [facility] ENDOSCOPY;  Service: Gastroenterology;  Laterality: N/A;   KNEE ARTHROPLASTY Right 12/27/2015   Procedure: COMPUTER ASSISTED TOTAL KNEE ARTHROPLASTY;  Surgeon: Donato Heinz, MD;  Location: [facility] ORS;  Service: Orthopedics;  Laterality: Right;   KNEE ARTHROSCOPY Left    LIPOMA EXCISION     right thigh   LITHOTRIPSY     PANCREATIC STENT PLACEMENT  05/30/2023   Procedure: PANCREATIC STENT PLACEMENT;  Surgeon: Iva Boop, MD;  Location: [facility] ENDOSCOPY;  Service: Gastroenterology;;   TOTAL KNEE ARTHROPLASTY Left 10/16/2012   Patient Active Problem List  Diagnosis Date Noted   Protein-calorie malnutrition, severe 06/02/2023   Calculus of GB and bile duct w acute cholecyst w/o obst 06/02/2023   Acute pancreatitis without infection or necrosis 06/01/2023   Post-ERCP acute pancreatitis 05/31/2023   Choledocholithiasis 05/31/2023   Gastritis and gastroduodenitis 05/30/2023   Atrial fibrillation (HCC) 05/30/2023   Elevated bilirubin 05/29/2023   Generalized abdominal pain 05/29/2023   Cholelithiasis with choledocholithiasis 05/29/2023   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   Elevated LFTs 05/28/2023   OSA on CPAP 05/28/2023   Ascending aortic aneurysm (HCC) 07/26/2020   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   S/P total knee arthroplasty 12/27/2015   Major depressive disorder with single episode, in full remission  (HCC) 12/23/2015   Anxiety 11/16/2015   Benign essential hypertension 11/16/2015   Depression 11/16/2015   Hypersomnia with sleep apnea 11/16/2015   Osteoarthritis 11/16/2015   Pure hypercholesterolemia 11/16/2015   Abdominal aortic aneurysm without rupture (HCC) 12/23/2014   Chronic kidney disease, stage 3 unspecified (HCC) 12/17/2014    ONSET DATE: May 27 2023  REFERRING DIAG: E44.1 (ICD-10-CM) - Mild protein-calorie malnutrition   THERAPY DIAG:  Muscle weakness (generalized)  Difficulty in walking, not elsewhere classified  Rationale for Evaluation and Treatment: Rehabilitation  SUBJECTIVE:                                                                                                                                                                                             SUBJECTIVE STATEMENT:  Pt has operation Tues morning. Today is his last PT visit prior to surgery.   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 Pt reports gallbladder attack July 14th 2024, went to ED, reports they found a "stone blockage." Pt then transferred to [facility] where ERCP was performed to remove stones of common bile duct. This was complicated by pancreatitis, heart failure and new onset of a-fib. He reports MRI taken at time where they found cancer in his liver. Pt has undergone 8 sessions of chemo, he also has a cholecystostomy tube that rests on his R side and down RLE. Pt reports future plans for procedure to "cut off blood supply" to R side of his liver and to remove gallbladder. Pt also with recent R portal vein, R hepatic vein embolization 12/04/23.  Recommendation for PT came following chemical stress test, where he reports physician wants him to get stronger before future procedures/surgeries. He has a follow-up in about 4 weeks. Pt notes decreased gait  speed, cautious with balance, and decreased strength. No falls. He has constant pain on his R side he reports is due to gallbladder. He has a treadmill at home that he has been using and he likes to walk his dog. 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   PMH significant for CA, mets to liver, HF, hx of 2 knee replacements (years ago), a-fib, AAA, basal cell carcinoma, chronic kidney disease, diverticulitis, HTN, melanoma, OA, sleep apnea, vitamin b12 deficiency, chemo 07/2023, 10/2023 cholecystostomy tube placement, 12/04/2023 R portal vein, R hepatic vein emobolization, please refer to chart for full details.    PAIN:  Are you having pain? R side pain, reports due to gallbladder pain, cholecystostomy tube placement in this region  PRECAUTIONS: Fall and Other:    cholecystostomy tube; pt with ascending thoracic aneurism under surveillance,  advised by his physician to progress PT slowly ;  RED FLAGS: Abdominal aneurysm: Yes: says has been diagnosed with 3, being followed for this     WEIGHT BEARING RESTRICTIONS: No  FALLS: Has patient fallen in last 6 months? No  LIVING ENVIRONMENT: Lives with: lives with their spouse and and dog  Lives in: House/apartment Stairs: Yes: External: 2 steps; on left going up Has following equipment at home:  reports able to sit down in his shower, has chair  PLOF: Independent  PATIENT GOALS: improve strength  OBJECTIVE:  Note: Objective measures were completed at Evaluation unless otherwise noted.  DIAGNOSTIC FINDINGS:  Via chart PET myocardial perfusion stress and rest 11/30/2023 "   FINAL COMMENTS   Non-diagnostic CT obtained for attenuation correction: Coronary artery calcifications are present. Aortic atherosclerosis.   Conclusions:.   1. Myocardial perfusion is abnormal with evidence of ischemia  in the LCX.   2. Global/regional myocardial blood flow reserve is abnormal. There is evidence of a flow limiting  stenosis in the LCX and   coronary microvascular dysfunction.   3. Normal left ventricular systolic function.      Electronically Reviewed by: Pamala Hurry   (Electronically Signed)   Final Date: 30 November 2023 17:03 "  MRI abdomen 11/18/2023 " Impression: " Impression:  1. Similar size of the enhancing hepatic segment 7 lesion measuring up to  4.5 cm, biopsy-proven adenocarcinoma. No other suspicious hepatic lesions  in the liver.  2.  Decompressed gallbladder with interval removal of the cholecystostomy  tube. Small fluid collection adjacent to/within the gallbladder neck,  possibly an intramural abscess.  3.  Previously described indeterminant 1.1 cm right renal lesion is  compatible with a simple cyst.   Electronically Reviewed by:  Consuelo Pandy, MD, [facility]  Electronically Reviewed on:  11/19/2023 10:21 AM   I have reviewed the images and concur with the above findings.   Electronically Signed by:  Gypsy Decant, MD, [facility]  Electronically Signed on:  11/19/2023 10:46 AM "   Extensive imagine history, please refer to chart for full details   COGNITION: Overall cognitive status: Within functional limits for tasks assessed   SENSATION: WNL BLE   COORDINATION: Rapid alt UE and LE WNL BLE and BUE Chin to target WNL Heel>shin WNL   EDEMA:  He reports he has some ankle swelling, reports onset "since this all began"    POSTURE:  slight fwd posture, rounded shoulders   LOWER EXTREMITY MMT:   LLE grossly 4+/5 RLE grossly 4/5 proximal, 4+/5 distal   GAIT: Gait pattern:  Decreased gait speed, observed decrease in step  length, decreased trunk rotation Distance walked: , clinic distances    FUNCTIONAL TESTS:   : 0.8 m/s  5xSTS:  16 sec use of BUE   TUG: 8.25 sec   Berg: see goal section   : see goal section   PATIENT SURVEYS:  See goal section                                                                                                                               TREATMENT DATE: 01/30/24   Physical Performance TEST: completed for pt's last visit prior to surgery to track changes for when pt returns 10 Meter Walk Test: Patient instructed to walk 10 meters (32.8 ft) as quickly and as safely as possible at their normal fast speed x2. Time measured from 2 meter mark to 8 meter mark to accommodate ramp-up and ramp-down.  speed 1: 2.17  m/s speed 2: 2 m/s Average speed: 2.1 m/s without AD Cut off scores: <0.4 m/s = household Ambulator, 0.4-0.8 m/s = limited community Ambulator, >0.8 m/s = community Ambulator, >1.2 m/s = crossing a street, <1.0 = increased fall risk MCID 0.05 m/s (small), 0.13 m/s (moderate) (ANPTA Core Set of Outcome Measures for Adults with Neurologic Conditions, 2018)  Five times Sit to Stand Test (FTSS) TIME: 10.6 sec - indicates no increased  fall risk Cut off scores indicative of increased fall risk: >12 sec CVA, >16 sec PD, >13 sec vestibular (ANPTA Core Set of Outcome Measures for Adults with Neurologic Conditions, 2018)   6 Min Walk Test:  Instructed patient to ambulate as quickly and as safely as possible for 6 minutes using LRAD. Patient was allowed to take standing rest breaks without stopping the test, but if the patient required a sitting rest break the clock would be stopped and the test would be over.  Results: 1,596 feet (486.4 m), no AD, with SBA. Results indicate improvement in functional capacity, pt has MET goal. Age Matched Norms: 86-69 yo M: 20 F: 22, 2-79 yo M: 65 F: 471, 33-89 yo M: 417 F: 392 MDC: 58.21 meters (190.98 feet) or 50 meters (ANPTA Core Set of Outcome Measures for Adults with Neurologic Conditions, 2018)  MMT: completed to assess LE strength  Proximal strength found to be grossly 4+/5 bilaterally - meeting goal, indicating strength increase  Results interpreted and reviewed with pt  TE: Seated heel raise 2x20 bilat LE Seated DF 2x20 bilat LE LAQ  2x10 Seated march 2x10 each LE - moderate   TA Treadmill cardio-performance/endurance training x 5 min total, 2 min @ 2.0 mph - pt rates easy-medium.  Rest breaks provided throughout    PATIENT EDUCATION: Education details: assessment results, progress, plan Person educated: Patient Education method: Explanation  Education comprehension: verbalized understanding  HOME EXERCISE PROGRAM: Access Code: [email or web address] URL: [email or web address] Date: 12/18/2023 Prepared by: Temple Pacini  Exercises - Seated March  - 2 x daily - 5-7 x weekly - 3 sets -  10 reps - Sit to Stand with Counter Support  - 2 x daily - 5-7 x weekly - 2 sets - 5-10 reps - Standing Tandem Balance with Counter Support  - 1 x daily - 7 x weekly - 2 sets - 1 reps - 30 sec/leg hold   GOALS: Goals reviewed with patient? Yes  SHORT TERM GOALS: Target date: 01/23/2024     Patient will be independent in home exercise program to improve strength/mobility for better functional independence with ADLs. Baseline: 3/5: met Goal status: met   LONG TERM GOALS: Target date: 03/04/2024  1.  Patient (> 41 years old) will complete five times sit to stand test in < 15 seconds indicating an increased LE strength and improved balance. Baseline: 16 sec use of BUE, very fatiguing 3/5: 11.8 no hands; 3/19: 10.6 sec  Goal status: MET  3.  Patient will increase Berg Balance score to 56/56 to demonstrate decreased fall risk during functional activities Baseline: 55/56 3/5: 56/56 Goal status: MET  4.  Patient will increase 10 meter walk test to >1.66m/s as to improve gait speed for better community ambulation and to reduce fall risk. Baseline: 0.8 m/s 3/5: 1.45 m/s; 3/19: 2.1 m/s no AD Goal status: MET  5.  Patient will increase R proximal LE strength to at least 4+/5 on MMT for improved gait ability Baseline: RLE grossly 4/5 proximal 3/5: knees 4+/5, ankles 4/5; 3/19: proximal strength is  4+/5 bilat LE Goal status:  MET  6.  Patient will increase six minute walk test distance to >1000 for progression to community ambulator and improve gait ability  Baseline: 754 ft no AD 3/5: 880 ft; 01/30/24: 1596 ft Goal status:  MET   7. Patient will increase ABC scale score >80% to demonstrate better functional mobility and better confidence with ADLs.   Baseline: 96%  Goal status: MET  8. Patient will increase Functional Gait Assessment score to >27/30 as to reduce fall risk and improve dynamic gait safety with community ambulation  Baseline: 3/5: 21/30; 3/19: not completed today  Goal status: INITIAL   ASSESSMENT:  CLINICAL IMPRESSION: Goal reassessment for pt's last visit prior to surgery (today will be d/c as pt will need new referral following surgery). Pt improved on all tests completed: , MMT, 5xSTS and . He has also now met MMT and goals (not previously met). This indicates improvements in gait speed, gait ability/functional capacity, LE strength/power and decreased fall risk. Plan to compare future performance to these numbers when pt returns to PT. Pt will benefit from future PT following his surgery but is to be discharged at this time.   OBJECTIVE IMPAIRMENTS: Abnormal gait, decreased activity tolerance, decreased balance, decreased endurance, decreased mobility, difficulty walking, decreased strength, increased edema, improper body mechanics, postural dysfunction, and pain.   ACTIVITY LIMITATIONS: lifting, squatting, transfers, and locomotion level  PARTICIPATION LIMITATIONS: cleaning, shopping, community activity, and yard work  PERSONAL FACTORS: Age and 3+ comorbidities: PMH significant for CA, mets to liver, HF, hx of 2 knee replacements (years ago), a-fib, AAA, basal cell carcinoma, chronic kidney disease, diverticulitis, HTN, melanoma, OA, sleep apnea, vitamin b12 deficiency, chemo 07/2023  are also affecting patient's functional outcome.   REHAB POTENTIAL: Good  CLINICAL DECISION  MAKING: Evolving/moderate complexity  EVALUATION COMPLEXITY: Moderate  PLAN:  PT FREQUENCY: 1-2x/week  PT DURATION: 12 weeks  PLANNED INTERVENTIONS: 97164- PT Re-evaluation, 97110-Therapeutic exercises, 97530- Therapeutic activity, [id number]- Neuromuscular re-education, 97535- Self Care, [id number]- Manual therapy, [id number]- Gait training, [phone]- Orthotic  Fit/training, Patient/Family education, Balance training, Stair training, Joint mobilization, Spinal mobilization, Vestibular training, DME instructions, Cryotherapy, and Moist heat  PLAN FOR NEXT SESSION: endurance/cardio, strength, higher level balance   Baird Kay, PT 01/30/2024, 12:09 PM

## 2024-02-04 ENCOUNTER — Ambulatory Visit: Payer: Medicare Other

## 2024-02-05 ENCOUNTER — Ambulatory Visit: Payer: Medicare HMO | Admitting: Gastroenterology

## 2024-02-06 ENCOUNTER — Ambulatory Visit: Payer: Medicare Other

## 2024-02-11 ENCOUNTER — Ambulatory Visit: Payer: Medicare Other

## 2024-02-13 ENCOUNTER — Ambulatory Visit: Payer: Medicare Other

## 2024-02-18 ENCOUNTER — Ambulatory Visit: Payer: Medicare Other

## 2024-02-20 ENCOUNTER — Ambulatory Visit: Payer: Medicare Other

## 2024-02-25 ENCOUNTER — Ambulatory Visit: Payer: Medicare Other

## 2024-02-26 ENCOUNTER — Ambulatory Visit: Payer: Medicare HMO | Admitting: Dermatology

## 2024-02-27 ENCOUNTER — Ambulatory Visit: Payer: Medicare Other

## 2024-03-03 ENCOUNTER — Ambulatory Visit: Payer: Medicare Other

## 2024-03-05 ENCOUNTER — Ambulatory Visit: Payer: Medicare Other

## 2024-03-10 ENCOUNTER — Ambulatory Visit: Payer: Medicare Other

## 2024-03-12 ENCOUNTER — Ambulatory Visit: Payer: Medicare Other

## 2024-03-17 ENCOUNTER — Ambulatory Visit: Payer: Medicare Other

## 2024-03-19 ENCOUNTER — Ambulatory Visit: Payer: Medicare Other

## 2024-03-24 ENCOUNTER — Ambulatory Visit: Payer: Medicare Other

## 2024-03-26 ENCOUNTER — Ambulatory Visit: Payer: Medicare Other

## 2024-03-31 ENCOUNTER — Ambulatory Visit: Payer: Medicare Other

## 2024-04-02 ENCOUNTER — Ambulatory Visit: Payer: Medicare Other

## 2024-04-09 ENCOUNTER — Ambulatory Visit: Payer: Medicare Other

## 2024-04-14 ENCOUNTER — Ambulatory Visit: Payer: Medicare Other

## 2024-04-16 ENCOUNTER — Ambulatory Visit: Payer: Medicare Other

## 2024-04-21 ENCOUNTER — Ambulatory Visit: Payer: Medicare Other

## 2024-04-23 ENCOUNTER — Ambulatory Visit: Payer: Medicare Other

## 2024-04-28 ENCOUNTER — Ambulatory Visit: Attending: Internal Medicine

## 2024-04-28 ENCOUNTER — Ambulatory Visit: Payer: Medicare Other

## 2024-04-28 DIAGNOSIS — R2681 Unsteadiness on feet: Secondary | ICD-10-CM | POA: Insufficient documentation

## 2024-04-28 DIAGNOSIS — R262 Difficulty in walking, not elsewhere classified: Secondary | ICD-10-CM | POA: Insufficient documentation

## 2024-04-28 DIAGNOSIS — M6281 Muscle weakness (generalized): Secondary | ICD-10-CM | POA: Insufficient documentation

## 2024-04-28 NOTE — Therapy (Signed)
 OUTPATIENT PHYSICAL THERAPY NEURO EVALUATION   Patient Name: Alexander Howe MRN: [id number] DOB:11-12-1946, 78 y.o., male Today's Date: 04/28/2024   PCP: Melchor Spoon, MD  REFERRING PROVIDER: Melchor Spoon, MD   END OF SESSION:  PT End of Session - 04/28/24 1645     Visit Number 1    Number of Visits 25    Date for PT Re-Evaluation 07/21/24    PT Start Time 1403    PT Stop Time 1445    PT Time Calculation (min) 42 min    Equipment Utilized During Treatment Gait belt    Activity Tolerance Patient tolerated treatment well    Behavior During Therapy WFL for tasks assessed/performed          Past Medical History:  Diagnosis Date   AAA (abdominal aortic aneurysm) without rupture (HCC)    Abdominal aneurysm (HCC)    3.5 cm   Actinic keratosis 08/26/2020   right lateral chest   Anxiety    Atrial fibrillation (HCC) 05/30/2023   Basal cell carcinoma 09/18/2018   left upper back/one excised, one superficial   Basal cell carcinoma 11/28/2018   left post shoulder   Basal cell carcinoma 08/26/2020   left medial pretibial, EDC 10/2020   Basal cell carcinoma 03/15/2022   right neck, EDC   Basal cell carcinoma (BCC) 08/31/2022   Posterior base of neck superficial. 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   right lateral chest, EDC 12/09/20   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(HCC)    Venous stasis    Venous stasis    Vitamin B 12 deficiency    Past Surgical History:  Procedure Laterality Date   BIOPSY  05/30/2023   Procedure: BIOPSY;  Surgeon: Kenney Peacemaker, MD;  Location: [facility] ENDOSCOPY;   Service: Gastroenterology;;   COLONOSCOPY W/ POLYPECTOMY     COLONOSCOPY WITH PROPOFOL  N/A 04/05/2018   Procedure: COLONOSCOPY WITH PROPOFOL ;  Surgeon: Cassie Click, MD;  Location: [facility] ENDOSCOPY;  Service: Endoscopy;  Laterality: N/A;   COLOSTOMY     COLOSTOMY REVERSAL     CYST REMOVAL LEG     ENDOSCOPIC RETROGRADE CHOLANGIOPANCREATOGRAPHY (ERCP) WITH PROPOFOL  N/A 05/30/2023   Procedure: ENDOSCOPIC RETROGRADE CHOLANGIOPANCREATOGRAPHY (ERCP) WITH PROPOFOL ;  Surgeon: Kenney Peacemaker, MD;  Location: [facility] ENDOSCOPY;  Service: Gastroenterology;  Laterality: N/A;   KNEE ARTHROPLASTY Right 12/27/2015   Procedure: COMPUTER ASSISTED TOTAL KNEE ARTHROPLASTY;  Surgeon: Arlyne Lame, MD;  Location: [facility] ORS;  Service: Orthopedics;  Laterality: Right;   KNEE ARTHROSCOPY Left    LIPOMA EXCISION     right thigh   LITHOTRIPSY     PANCREATIC STENT PLACEMENT  05/30/2023   Procedure: PANCREATIC STENT PLACEMENT;  Surgeon: Kenney Peacemaker, MD;  Location: [facility] ENDOSCOPY;  Service: Gastroenterology;;   TOTAL KNEE ARTHROPLASTY Left 10/16/2012   Patient Active Problem List   Diagnosis Date Noted   Protein-calorie malnutrition, severe 06/02/2023   Calculus of GB and  bile duct w acute cholecyst w/o obst 06/02/2023   Acute pancreatitis without infection or necrosis 06/01/2023   Post-ERCP acute pancreatitis 05/31/2023   Choledocholithiasis 05/31/2023   Gastritis and gastroduodenitis 05/30/2023   Atrial fibrillation (HCC) 05/30/2023   Elevated bilirubin 05/29/2023   Generalized abdominal pain 05/29/2023   Cholelithiasis with choledocholithiasis 05/29/2023   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   Elevated LFTs 05/28/2023   OSA on CPAP 05/28/2023   Ascending aortic aneurysm (HCC) 07/26/2020   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   S/P total knee arthroplasty 12/27/2015   Major depressive disorder with single episode, in full remission (HCC) 12/23/2015   Anxiety 11/16/2015   Benign essential  hypertension 11/16/2015   Depression 11/16/2015   Hypersomnia with sleep apnea 11/16/2015   Osteoarthritis 11/16/2015   Pure hypercholesterolemia 11/16/2015   Abdominal aortic aneurysm without rupture (HCC) 12/23/2014   Chronic kidney disease, stage 3 unspecified (HCC) 12/17/2014    ONSET DATE: 02/05/24  REFERRING DIAG:  Diagnosis  E44.1 (ICD-10-CM) - Mild protein-calorie malnutrition    THERAPY DIAG:  Muscle weakness (generalized)  Difficulty in walking, not elsewhere classified  Unsteadiness on feet  Rationale for Evaluation and Treatment: Rehabilitation  SUBJECTIVE:                                                                                                                                                                                             SUBJECTIVE STATEMENT: Pt is a pleasant 78 y/o male who is s/p hepatectomy resection of liver, right lobectomy 02/05/2024. Pt reports all tubes have been removed as of last Monday (cholecystostomy tube). He had home health PT following surgery, which stopped last week. Pt wants to continue to get stronger. He's mostly been working on walking at home. Pt reports he feels his balance has been affected.  Pt accompanied by: self  PERTINENT HISTORY:   Pt known to clinic (see previous hx below), now returns s/p hepatectomy resection of liver, right lobectomy 02/05/2024.  Prior to surgery with previous bout of PT: The pt is a pleasant 78 yo male presenting to PT with concerns of weakness with recent findings of liver metastasis with unknown primary site. Pt reports gallbladder attack July 14th 2024, went to ED, reports they found a stone blockage. Pt then transferred to [facility] where ERCP was performed to remove stones of common bile duct. This was complicated by pancreatitis, heart failure and new onset of a-fib. He reports MRI taken at time where they found cancer in his liver. Pt has undergone 8 sessions of chemo, he also has a  cholecystostomy tube that rests on his R side and  down RLE. Pt reports future plans for procedure to cut off blood supply to R side of his liver and to remove gallbladder. Pt also with recent R portal vein, R hepatic vein embolization 12/04/23... 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  PMH significant for CA, mets to liver, HF, hx of 2 knee replacements (years ago), a-fib, AAA, basal cell carcinoma, chronic kidney disease, diverticulitis, HTN, melanoma, OA, sleep apnea, vitamin b12 deficiency, chemo 07/2023, 10/2023 cholecystostomy tube placement, 12/04/2023 R portal vein, R hepatic vein emobolization, please refer to chart for full details.   PAIN:  Are you having pain? Reports occ abdominal pain   PRECAUTIONS:  Pt previously had 10# lifting restrictions, but reports those have since been removed, but to progress activity gradually. He says he has not currently worked with more than 3# weights with previous HH PT. Pt also did just recently have cholecystostomy tube removed as of last Monday.  Hx of ascending thoracic aneurysm under surveillance    RED FLAGS: Pt with hx of abdominal aneurysm but it is unchanged  WEIGHT BEARING RESTRICTIONS: No  FALLS: Has patient fallen in last 6 months?  LIVING ENVIRONMENT: via chart (from previous outpatient PT course) Lives with: lives with their spouse and has a dog Lives in: House/apartment Stairs: 2 external steps with handrail on L going up Has following equipment at home: shower chair  PLOF: Reports did need some assistance with ADLs until drain recently removed  PATIENT GOALS:  Pt wants to get stronger, get rid of some excess fluid.  OBJECTIVE:  Note: Objective measures were completed at Evaluation unless otherwise noted.  DIAGNOSTIC FINDINGS: via chart   CT abdomen pelvis 04/04/2024 Findings:   - Lower Thorax: No suspicious pulmonary abnormalities. Similar right  pleural thickening with trace  effusion.   - Liver: Similar findings of prior right hepatic lobe resection.  Percutaneous drainage catheter is in place with continued reduction in size  of postoperative fluid collection, now measuring approximately 5.7 x 3.3  cm, previously 7.7 x 4.8 cm. Tiny cysts elsewhere in the liver without  findings of recurrence. No suspicious hepatic masses are identified.  The  portal and hepatic veins are patent.   - Biliary and Gallbladder: No intrahepatic or extrahepatic bile duct  dilation. The gallbladder surgically absent.   - Spleen: Normal in appearance.    - Pancreas: Numerous cystic lesions scattered throughout the spleen,  stable, measuring up to 2.4 cm in the tail. No solid lesions or pancreatic  ductal dilation.   - Adrenal Glands: Normal in appearance.   - Kidneys: Similar benign renal cysts bilaterally measuring up to 9.2 cm in  the lower pole of the right kidney. No suspicious renal lesions. No  hydronephrosis.   - Abdominal and Pelvic Vasculature: No abdominal aortic aneurysm. Moderate  atherosclerotic plaque.   - Gastrointestinal Tract: No abnormal dilation or wall thickening.   - Peritoneum/Mesentery/Retroperitoneum: No free fluid.  No free  intraperitoneal air.   - Lymph Nodes: No retroperitoneal, mesenteric, pelvic, or inguinal  lymphadenopathy.    - Bladder: Similar intraluminal bladder calculi with chronic bladder wall  thickening.   - Pelvic Organs: Unremarkable prostate.   - Body Wall: Continued interval decrease in size of right chest/abdominal  wall hematoma, measuring 9.1 x 2.0 cm, previously 13.2 x 3.6 cm.   - Musculoskeletal:  No aggressive appearing osseous lesions. There are  moderate degenerative changes of the lumbosacral spine.   Impression:  1. Continued  interval reduction in size of right upper quadrant fluid  collection and chest/abdominal wall hematoma.  2. No evidence of metastatic disease in abdomen or pelvis.   Electronically  Signed by:  Darnelle Elders, MD, [facility]  Electronically Signed on:  04/04/2024 12:26 PM   COGNITION: Overall cognitive status: Within functional limits for tasks assessed   SENSATION: Reports no n/t anywhere   COORDINATION:  WFL rapid alt movement BUE/BLE Heel>shin WNL bilat LE  EDEMA:  Pt with bilat ankle swelling (reports since surgery), planning to get compression socks     POSTURE: Slight hunched/fwd posture and rounded shoulders    LOWER EXTREMITY MMT:    MMT Right Eval Left Eval  Hip flexion 4 4  Hip extension    Hip abduction 4+ 4+  Hip adduction 4 4+  Hip internal rotation    Hip external rotation    Knee flexion 5 5  Knee extension 5 5  Ankle dorsiflexion 4+ 4+  Ankle plantarflexion 5 5  Ankle inversion    Ankle eversion    (Blank rows = not tested)  STAIRS: Not assessed on this date GAIT: No AD, pt with decreased R heel strike, decreased bilateral hip extension, and flexed/hunched posture.   FUNCTIONAL TESTS:  5 times sit to stand: 12.5 seconds with use of UE  : 1.23 m/s  :  1066 ft without an AD FGA: to be assessed    PATIENT SURVEYS:  ABC scale  today 91.9%                                                                                                                              TREATMENT DATE:   Self-Care/Home Management: PT reviewed findings of eval, prognosis for PT and plan Initiated HEP, reviewed technique, and confirmed technique with pt completing sets & reps as prescribed below in session  Access Code: [email or web address] URL: [email or web address] Date: 04/28/2024 Prepared by: Aminta Kales  Exercises - Standing March with Counter Support  - 1-2 x daily - 3-5 x weekly - 2 sets - 10 reps - Standing Hip Abduction with Counter Support  - 1-2 x daily - 3-5 x weekly - 2 sets - 10 reps  PATIENT EDUCATION: Education details: patient Person educated: Patient Education method: Programmer, multimedia, Facilities manager, Verbal  cues, and Handouts Education comprehension: verbalized understanding and returned demonstration  HOME EXERCISE PROGRAM: Access Code: [email or web address] URL: [email or web address] Date: 04/28/2024 Prepared by: Aminta Kales  Exercises - Standing March with Counter Support  - 1-2 x daily - 3-5 x weekly - 2 sets - 10 reps - Standing Hip Abduction with Counter Support  - 1-2 x daily - 3-5 x weekly - 2 sets - 10 reps  GOALS: Goals reviewed with patient? Yes   SHORT TERM GOALS: Target date: 06/09/2024    Patient will be independent in home exercise program to improve strength/mobility for better functional independence with ADLs. Baseline: initiated  Goal status: INITIAL   LONG TERM GOALS:  Target date: 07/21/2024   Patient will increase ABC scale score >95% to demonstrate better functional mobility and better confidence with ADLs and return to confidence levels pt achieved with previous bout of therapy Baseline:  today 91.9% (prior to previous PT d/c reached 96%) Goal status: INITIAL  2.  Patient will complete five times sit to stand test in < 10 seconds indicating an increased LE strength and mm power. Baseline: 12.5 seconds with use of UE (previous PT course pt achieved 10.6 sec) Goal status: INITIAL  3. Patient will increase Functional Gait Assessment score to >27/30 as to reduce fall risk and improve dynamic gait safety with community ambulation  Baseline: not assessed today/deferred, but in previous PT reached 21/30 Goal status: INITIAL  4.  Patient will increase 10 meter walk test to >1.2 m/s as to improve gait speed for better community ambulation and return to or exceed level of function reached with previous bout of PT. Baseline: today at eval 1.23 m/s (previous PT reached 2.1 m/s on 3/19) Goal status: INITIAL  5.  Patient will increase six minute walk test distance to >1500 ft to achieve or exceed PLOF and improve functional capacity  Baseline: 1066 ft without AD (in  previous bout of PT achieved 1596 ft) Goal status: INITIAL     ASSESSMENT:  CLINICAL IMPRESSION: Patient is a pleasant 78 y.o. male who was seen today for physical therapy evaluation and treatment s/p hepatectomy, right lobectomy 02/05/2024. Exam findings indicate pt is not currently at PLOF found at previous PT d/c on 01/30/2024, with deficits of BLE strength, gait speed/mechanics, functional capacity and balance confidence per ABC questionnaire. Pt also reports decreased balance since surgery, but due to time, FGA reassessment deferred to future visit. The pt will benefit from further skilled PT to improve the aforementioned deficits in order increase strength, gait speed/functional capacity, balance and balance confidence, and return to PLOF.   OBJECTIVE IMPAIRMENTS: Abnormal gait, decreased activity tolerance, decreased balance, decreased endurance, difficulty walking, decreased strength, increased edema, improper body mechanics, postural dysfunction, and pain.   ACTIVITY LIMITATIONS: carrying, lifting, squatting, stairs, transfers, and locomotion level  PARTICIPATION LIMITATIONS: cleaning, shopping, community activity, and yard work  PERSONAL FACTORS: Age, Time since onset of injury/illness/exacerbation, and 3+ comorbidities: PMH significant for CA, mets to liver, HF, hx of 2 knee replacements (years ago), a-fib, AAA, basal cell carcinoma, chronic kidney disease, diverticulitis, HTN, melanoma, OA, sleep apnea, vitamin b12 deficiency, chemo 07/2023, 10/2023 cholecystostomy tube placement, 12/04/2023 R portal vein, R hepatic vein emobolization, please refer to chart for full details. are also affecting patient's functional outcome.   REHAB POTENTIAL: Good  CLINICAL DECISION MAKING: Evolving/moderate complexity  EVALUATION COMPLEXITY: Moderate  PLAN:  PT FREQUENCY: 1-2x/week  PT DURATION: 12 weeks  PLANNED INTERVENTIONS: 97164- PT Re-evaluation, 97750- Physical Performance Testing,  97110-Therapeutic exercises, 97530- Therapeutic activity, [id number]- Neuromuscular re-education, 97535- Self Care, 16109- Manual therapy, [phone]- Gait training, [phone]- Orthotic Initial, [phone]- Orthotic/Prosthetic subsequent, [phone]- Canalith repositioning, Patient/Family education, Balance training, Stair training, Taping, Joint mobilization, Spinal mobilization, Scar mobilization, Vestibular training, DME instructions, Cryotherapy, and Moist heat  PLAN FOR NEXT SESSION: FGA, add to HEP, strength, endurance   Samie Crews, PT 04/28/2024, 5:30 PM

## 2024-04-30 ENCOUNTER — Ambulatory Visit: Payer: Medicare Other

## 2024-04-30 ENCOUNTER — Ambulatory Visit

## 2024-04-30 DIAGNOSIS — R2681 Unsteadiness on feet: Secondary | ICD-10-CM

## 2024-04-30 DIAGNOSIS — M6281 Muscle weakness (generalized): Secondary | ICD-10-CM | POA: Diagnosis not present

## 2024-04-30 DIAGNOSIS — R262 Difficulty in walking, not elsewhere classified: Secondary | ICD-10-CM

## 2024-04-30 NOTE — Therapy (Signed)
 OUTPATIENT PHYSICAL THERAPY NEURO Treatment   Patient Name: Alexander Howe MRN: [id number] DOB:[birth year], 78 y.o., male Today's Date: 04/30/2024   PCP: Fernande Ophelia JINNY DOUGLAS, MD  REFERRING PROVIDER: Fernande Ophelia JINNY DOUGLAS, MD   END OF SESSION:  PT End of Session - 04/30/24 1444     Visit Number 2    Number of Visits 25    Date for PT Re-Evaluation 07/21/24    PT Start Time 1447    PT Stop Time 1527    PT Time Calculation (min) 40 min    Equipment Utilized During Treatment Gait belt    Activity Tolerance Patient tolerated treatment well    Behavior During Therapy WFL for tasks assessed/performed           Past Medical History:  Diagnosis Date   AAA (abdominal aortic aneurysm) without rupture (HCC)    Abdominal aneurysm (HCC)    3.5 cm   Actinic keratosis 08/26/2020   right lateral chest   Anxiety    Atrial fibrillation (HCC) 05/30/2023   Basal cell carcinoma 09/18/2018   left upper back/one excised, one superficial   Basal cell carcinoma 11/28/2018   left post shoulder   Basal cell carcinoma 08/26/2020   left medial pretibial, EDC 10/2020   Basal cell carcinoma 03/15/2022   right neck, EDC   Basal cell carcinoma (BCC) 08/31/2022   Posterior base of neck superficial. 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   right lateral chest, EDC 12/09/20   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(HCC)    Venous stasis    Venous stasis    Vitamin B 12 deficiency    Past Surgical History:  Procedure Laterality Date   BIOPSY  05/30/2023   Procedure: BIOPSY;  Surgeon: Avram Lupita BRAVO, MD;  Location: [facility]  ENDOSCOPY;  Service: Gastroenterology;;   COLONOSCOPY W/ POLYPECTOMY     COLONOSCOPY WITH PROPOFOL  N/A 04/05/2018   Procedure: COLONOSCOPY WITH PROPOFOL ;  Surgeon: Viktoria Lamar DASEN, MD;  Location: [facility] ENDOSCOPY;  Service: Endoscopy;  Laterality: N/A;   COLOSTOMY     COLOSTOMY REVERSAL     CYST REMOVAL LEG     ENDOSCOPIC RETROGRADE CHOLANGIOPANCREATOGRAPHY (ERCP) WITH PROPOFOL  N/A 05/30/2023   Procedure: ENDOSCOPIC RETROGRADE CHOLANGIOPANCREATOGRAPHY (ERCP) WITH PROPOFOL ;  Surgeon: Avram Lupita BRAVO, MD;  Location: [facility] ENDOSCOPY;  Service: Gastroenterology;  Laterality: N/A;   KNEE ARTHROPLASTY Right 12/27/2015   Procedure: COMPUTER ASSISTED TOTAL KNEE ARTHROPLASTY;  Surgeon: Lynwood SHAUNNA Hue, MD;  Location: [facility] ORS;  Service: Orthopedics;  Laterality: Right;   KNEE ARTHROSCOPY Left    LIPOMA EXCISION     right thigh   LITHOTRIPSY     PANCREATIC STENT PLACEMENT  05/30/2023   Procedure: PANCREATIC STENT PLACEMENT;  Surgeon: Avram Lupita BRAVO, MD;  Location: [facility] ENDOSCOPY;  Service: Gastroenterology;;   TOTAL KNEE ARTHROPLASTY Left 10/16/2012   Patient Active Problem List   Diagnosis Date Noted   Protein-calorie malnutrition, severe 06/02/2023   Calculus of GB  and bile duct w acute cholecyst w/o obst 06/02/2023   Acute pancreatitis without infection or necrosis 06/01/2023   Post-ERCP acute pancreatitis 05/31/2023   Choledocholithiasis 05/31/2023   Gastritis and gastroduodenitis 05/30/2023   Atrial fibrillation (HCC) 05/30/2023   Elevated bilirubin 05/29/2023   Generalized abdominal pain 05/29/2023   Cholelithiasis with choledocholithiasis 05/29/2023   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   Elevated LFTs 05/28/2023   OSA on CPAP 05/28/2023   Ascending aortic aneurysm (HCC) 07/26/2020   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   S/P total knee arthroplasty 12/27/2015   Major depressive disorder with single episode, in full remission (HCC) 12/23/2015   Anxiety 11/16/2015   Benign  essential hypertension 11/16/2015   Depression 11/16/2015   Hypersomnia with sleep apnea 11/16/2015   Osteoarthritis 11/16/2015   Pure hypercholesterolemia 11/16/2015   Abdominal aortic aneurysm without rupture (HCC) 12/23/2014   Chronic kidney disease, stage 3 unspecified (HCC) 12/17/2014    ONSET DATE: 02/05/24  REFERRING DIAG:  Diagnosis  E44.1 (ICD-10-CM) - Mild protein-calorie malnutrition    THERAPY DIAG:  Muscle weakness (generalized)  Difficulty in walking, not elsewhere classified  Unsteadiness on feet  Rationale for Evaluation and Treatment: Rehabilitation  SUBJECTIVE:                                                                                                                                                                                             SUBJECTIVE STATEMENT: Pt reports that he felt okay after his evaluation on Monday. Pt states that he had some soreness after previous session that lasted until the next day.  Pt accompanied by: self  PERTINENT HISTORY:   Pt known to clinic (see previous hx below), now returns s/p hepatectomy resection of liver, right lobectomy 02/05/2024.  Prior to surgery with previous bout of PT: The pt is a pleasant 78 yo male presenting to PT with concerns of weakness with recent findings of liver metastasis with unknown primary site. Pt reports gallbladder attack July 14th 2024, went to ED, reports they found a stone blockage. Pt then transferred to [facility] where ERCP was performed to remove stones of common bile duct. This was complicated by pancreatitis, heart failure and new onset of a-fib. He reports MRI taken at time where they found cancer in his liver. Pt has undergone 8 sessions of chemo, he also has a cholecystostomy tube that rests on his R side and down RLE. Pt reports future plans for procedure to cut off blood supply to R side of his liver and to remove gallbladder. Pt also with recent R portal vein, R hepatic vein  embolization 12/04/23... 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  PMH significant for CA, mets to liver, HF, hx of 2 knee replacements (years ago), a-fib, AAA, basal cell carcinoma, chronic kidney disease, diverticulitis, HTN, melanoma, OA, sleep apnea, vitamin b12 deficiency, chemo 07/2023, 10/2023 cholecystostomy tube placement, 12/04/2023 R portal vein, R hepatic vein emobolization, please refer to chart for full details.   PAIN:  Are you having pain? Reports occ abdominal pain   PRECAUTIONS:  Pt previously had 10# lifting restrictions, but reports those have since been removed, but to progress activity gradually. He says he has not currently worked with more than 3# weights with previous HH PT. Pt also did just recently have cholecystostomy tube removed as of last Monday.  Hx of ascending thoracic aneurysm under surveillance    RED FLAGS: Pt with hx of abdominal aneurysm but it is unchanged  WEIGHT BEARING RESTRICTIONS: No  FALLS: Has patient fallen in last 6 months?  LIVING ENVIRONMENT: via chart (from previous outpatient PT course) Lives with: lives with their spouse and has a dog Lives in: House/apartment Stairs: 2 external steps with handrail on L going up Has following equipment at home: shower chair  PLOF: Reports did need some assistance with ADLs until drain recently removed  PATIENT GOALS:  Pt wants to get stronger, get rid of some excess fluid.  OBJECTIVE:  Note: Objective measures were completed at Evaluation unless otherwise noted.  DIAGNOSTIC FINDINGS: via chart   CT abdomen pelvis 04/04/2024 Findings:   - Lower Thorax: No suspicious pulmonary abnormalities. Similar right  pleural thickening with trace effusion.   - Liver: Similar findings of prior right hepatic lobe resection.  Percutaneous drainage catheter is in place with continued reduction in size  of postoperative fluid collection, now measuring approximately  5.7 x 3.3  cm, previously 7.7 x 4.8 cm. Tiny cysts elsewhere in the liver without  findings of recurrence. No suspicious hepatic masses are identified.  The  portal and hepatic veins are patent.   - Biliary and Gallbladder: No intrahepatic or extrahepatic bile duct  dilation. The gallbladder surgically absent.   - Spleen: Normal in appearance.    - Pancreas: Numerous cystic lesions scattered throughout the spleen,  stable, measuring up to 2.4 cm in the tail. No solid lesions or pancreatic  ductal dilation.   - Adrenal Glands: Normal in appearance.   - Kidneys: Similar benign renal cysts bilaterally measuring up to 9.2 cm in  the lower pole of the right kidney. No suspicious renal lesions. No  hydronephrosis.   - Abdominal and Pelvic Vasculature: No abdominal aortic aneurysm. Moderate  atherosclerotic plaque.   - Gastrointestinal Tract: No abnormal dilation or wall thickening.   - Peritoneum/Mesentery/Retroperitoneum: No free fluid.  No free  intraperitoneal air.   - Lymph Nodes: No retroperitoneal, mesenteric, pelvic, or inguinal  lymphadenopathy.    - Bladder: Similar intraluminal bladder calculi with chronic bladder wall  thickening.   - Pelvic Organs: Unremarkable prostate.   - Body Wall: Continued interval decrease in size of right chest/abdominal  wall hematoma, measuring 9.1 x 2.0 cm, previously 13.2 x 3.6 cm.   - Musculoskeletal:  No aggressive appearing osseous lesions. There are  moderate degenerative changes of the lumbosacral spine.   Impression:  1. Continued interval reduction in size of right upper quadrant fluid  collection and chest/abdominal wall hematoma.  2. No evidence of metastatic disease in abdomen or pelvis.   Electronically Signed by:  Toya Irving, MD, [facility]  Electronically Signed on:  04/04/2024 12:26 PM   COGNITION: Overall cognitive status: Within functional limits for tasks assessed   SENSATION: Reports no n/t anywhere    COORDINATION:  WFL rapid alt movement BUE/BLE Heel>shin WNL bilat LE  EDEMA:  Pt with bilat ankle swelling (reports since surgery), planning to get compression socks     POSTURE: Slight hunched/fwd posture and rounded shoulders    LOWER EXTREMITY MMT:    MMT Right Eval Left Eval  Hip flexion 4 4  Hip extension    Hip abduction 4+ 4+  Hip adduction 4 4+  Hip internal rotation    Hip external rotation    Knee flexion 5 5  Knee extension 5 5  Ankle dorsiflexion 4+ 4+  Ankle plantarflexion 5 5  Ankle inversion    Ankle eversion    (Blank rows = not tested)  STAIRS: Not assessed on this date GAIT: No AD, pt with decreased R heel strike, decreased bilateral hip extension, and flexed/hunched posture.   FUNCTIONAL TESTS:  5 times sit to stand: 12.5 seconds with use of UE  : 1.23 m/s  :  1066 ft without an AD FGA: assessed on 04/30/24:  [facility] PT Assessment - 04/30/24 0001       Functional Gait  Assessment   Gait assessed  Yes    Gait Level Surface Walks 20 ft in less than 5.5 sec, no assistive devices, good speed, no evidence for imbalance, normal gait pattern, deviates no more than 6 in outside of the 12 in walkway width.    Change in Gait Speed Makes only minor adjustments to walking speed, or accomplishes a change in speed with significant gait deviations, deviates 10-15 in outside the 12 in walkway width, or changes speed but loses balance but is able to recover and continue walking.   LOB with transition from fast speed to slow speed   Gait with Horizontal Head Turns Performs head turns smoothly with slight change in gait velocity (eg, minor disruption to smooth gait path), deviates 6-10 in outside 12 in walkway width, or uses an assistive device.    Gait with Vertical Head Turns Performs task with slight change in gait velocity (eg, minor disruption to smooth gait path), deviates 6 - 10 in outside 12 in walkway width or uses assistive device    Gait and  Pivot Turn Pivot turns safely within 3 sec and stops quickly with no loss of balance.    Step Over Obstacle Is able to step over one shoe box (4.5 in total height) without changing gait speed. No evidence of imbalance.    Gait with Narrow Base of Support Ambulates 7-9 steps.    Gait with Eyes Closed Walks 20 ft, uses assistive device, slower speed, mild gait deviations, deviates 6-10 in outside 12 in walkway width. Ambulates 20 ft in less than 9 sec but greater than 7 sec.    Ambulating Backwards Walks 20 ft, uses assistive device, slower speed, mild gait deviations, deviates 6-10 in outside 12 in walkway width.    Steps Alternating feet, must use rail.    Total Score 21             PATIENT SURVEYS:  ABC scale  today 91.9%  TREATMENT DATE: 04/30/24  Physical Performance: FGA performed today: score 21/30 please see flow sheet for details and impression below  Neuro Re-ed: Dynamic standing balance on Airex: - Static stance x30 seconds - Narrow stance x30 seconds - One foot on Airex, one on 6 inch step 2x30 seconds each LE - Horizontal head turns x30 seconds  - Vertical head turns x30 seconds - Marches 2x30 seconds Ball toss on Airex 2x15 tosses forward and lateral Obstacle courses:  - 3 half foam rollers & Airex balance beam- pt steps over foam rollers, tandem walks on Airex balance beam 1x down & back - 1 orange hurdle, 4 hedgehogs, Airex balance beam, 1 half foam roller- pt steps over hurdle, weaves through hedgehogs, tandem walks Airex balance beam, steps over half foam roller 2x down & back   TA: Instruction in update to HEP with pt completing a few reps in session: Access Code: [email or web address] URL: [email or web address] Date: 04/30/2024 Prepared by: Darryle Patten  Exercises - Mini Squat with Counter Support  - 1 x daily - 3 x weekly - 2  sets - 10 reps - Lunge with Counter Support  - 1 x daily - 3 x weekly - 2 sets - 10 reps   PATIENT EDUCATION: Education details:further assessment findings, HEP Person educated: Patient Education method: Explanation, Demonstration, Verbal cues, and Handouts Education comprehension: verbalized understanding and returned demonstration  HOME EXERCISE PROGRAM: Access Code: [email or web address] URL: [email or web address] Date: 04/30/2024 Prepared by: Darryle Patten  Exercises - Mini Squat with Counter Support  - 1 x daily - 3 x weekly - 2 sets - 10 reps - Lunge with Counter Support  - 1 x daily - 3 x weekly - 2 sets - 10 reps   Access Code: [email or web address] URL: [email or web address] Date: 04/28/2024 Prepared by: Darryle Patten  Exercises - Standing March with Counter Support  - 1-2 x daily - 3-5 x weekly - 2 sets - 10 reps - Standing Hip Abduction with Counter Support  - 1-2 x daily - 3-5 x weekly - 2 sets - 10 reps  GOALS: Goals reviewed with patient? Yes   SHORT TERM GOALS: Target date: 06/09/2024    Patient will be independent in home exercise program to improve strength/mobility for better functional independence with ADLs. Baseline: initiated  Goal status: INITIAL   LONG TERM GOALS: Target date: 07/21/2024   Patient will increase ABC scale score >95% to demonstrate better functional mobility and better confidence with ADLs and return to confidence levels pt achieved with previous bout of therapy Baseline:  today 91.9% (prior to previous PT d/c reached 96%) Goal status: INITIAL  2.  Patient will complete five times sit to stand test in < 10 seconds indicating an increased LE strength and mm power. Baseline: 12.5 seconds with use of UE (previous PT course pt achieved 10.6 sec) Goal status: INITIAL  3. Patient will increase Functional Gait Assessment score to >27/30 as to reduce fall risk and improve dynamic gait safety with community ambulation  Baseline: 21/30-  performed on 04/30/24 Goal status: INITIAL  4.  Patient will increase 10 meter walk test to >1.2 m/s as to improve gait speed for better community ambulation and return to or exceed level of function reached with previous bout of PT. Baseline: today at eval 1.23 m/s (previous PT reached 2.1 m/s on 3/19) Goal status: INITIAL  5.  Patient will increase six minute walk test distance to >1500 ft to achieve or exceed PLOF and improve functional capacity  Baseline: 1066  ft without AD (in previous bout of PT achieved 1596 ft) Goal status: INITIAL     ASSESSMENT:  CLINICAL IMPRESSION: Pt tolerated progressive dynamic balance and obstacle navigation well.  Pt's ankle righting strategy was challenged by Airex balance beam and tandem walking. Pt's performance and score on FGA today is just below cut-off score indicating fall risk for pt's age group. Pt remains highly motivated during session to improve his balance and is receptive to education on additional exercises added to HEP today. The pt will benefit from further skilled PT to improve the aforementioned deficits in order increase strength, gait speed/functional capacity, balance and balance confidence, and return to PLOF.   OBJECTIVE IMPAIRMENTS: Abnormal gait, decreased activity tolerance, decreased balance, decreased endurance, difficulty walking, decreased strength, increased edema, improper body mechanics, postural dysfunction, and pain.   ACTIVITY LIMITATIONS: carrying, lifting, squatting, stairs, transfers, and locomotion level  PARTICIPATION LIMITATIONS: cleaning, shopping, community activity, and yard work  PERSONAL FACTORS: Age, Time since onset of injury/illness/exacerbation, and 3+ comorbidities: PMH significant for CA, mets to liver, HF, hx of 2 knee replacements (years ago), a-fib, AAA, basal cell carcinoma, chronic kidney disease, diverticulitis, HTN, melanoma, OA, sleep apnea, vitamin b12 deficiency, chemo 07/2023, 10/2023  cholecystostomy tube placement, 12/04/2023 R portal vein, R hepatic vein emobolization, please refer to chart for full details. are also affecting patient's functional outcome.   REHAB POTENTIAL: Good  CLINICAL DECISION MAKING: Evolving/moderate complexity  EVALUATION COMPLEXITY: Moderate  PLAN:  PT FREQUENCY: 1-2x/week  PT DURATION: 12 weeks  PLANNED INTERVENTIONS: 97164- PT Re-evaluation, 97750- Physical Performance Testing, 97110-Therapeutic exercises, 97530- Therapeutic activity, 97112- Neuromuscular re-education, 97535- Self Care, [id number]- Manual therapy, [phone]- Gait training, [phone]- Orthotic Initial, [phone]- Orthotic/Prosthetic subsequent, [phone]- Canalith repositioning, Patient/Family education, Balance training, Stair training, Taping, Joint mobilization, Spinal mobilization, Scar mobilization, Vestibular training, DME instructions, Cryotherapy, and Moist heat  PLAN FOR NEXT SESSION: add to HEP, strength, endurance, progressive dynamic balance training  Nayeliz Hipp, SPT   Dellamae Rosamilia, Student-PT 04/30/2024, 4:03 PM   This entire session was performed under direct supervision and direction of a licensed physical therapist . I have personally read, edited and approve of the note as written. Darryle Patten PT, DPT

## 2024-05-05 ENCOUNTER — Ambulatory Visit

## 2024-05-05 ENCOUNTER — Ambulatory Visit: Payer: Medicare Other

## 2024-05-07 ENCOUNTER — Ambulatory Visit: Payer: Medicare Other

## 2024-05-07 ENCOUNTER — Ambulatory Visit: Admitting: Physical Therapy

## 2024-05-07 DIAGNOSIS — R262 Difficulty in walking, not elsewhere classified: Secondary | ICD-10-CM

## 2024-05-07 DIAGNOSIS — M6281 Muscle weakness (generalized): Secondary | ICD-10-CM | POA: Diagnosis not present

## 2024-05-07 DIAGNOSIS — R2681 Unsteadiness on feet: Secondary | ICD-10-CM

## 2024-05-07 NOTE — Therapy (Signed)
 OUTPATIENT PHYSICAL THERAPY NEURO Treatment   Patient Name: Alexander Howe MRN: [id number] DOB:December 08, 1945, 78 y.o., male Today's Date: 05/07/2024   PCP: Fernande Ophelia JINNY DOUGLAS, MD  REFERRING PROVIDER: Fernande Ophelia JINNY DOUGLAS, MD   END OF SESSION:  PT End of Session - 05/07/24 1322     Visit Number 3    Number of Visits 25    Date for PT Re-Evaluation 07/21/24    Progress Note Due on Visit 10    PT Start Time 1318    PT Stop Time 1358    PT Time Calculation (min) 40 min    Equipment Utilized During Treatment Gait belt    Activity Tolerance Patient tolerated treatment well    Behavior During Therapy WFL for tasks assessed/performed           Past Medical History:  Diagnosis Date   AAA (abdominal aortic aneurysm) without rupture (HCC)    Abdominal aneurysm (HCC)    3.5 cm   Actinic keratosis 08/26/2020   right lateral chest   Anxiety    Atrial fibrillation (HCC) 05/30/2023   Basal cell carcinoma 09/18/2018   left upper back/one excised, one superficial   Basal cell carcinoma 11/28/2018   left post shoulder   Basal cell carcinoma 08/26/2020   left medial pretibial, EDC 10/2020   Basal cell carcinoma 03/15/2022   right neck, EDC   Basal cell carcinoma (BCC) 08/31/2022   Posterior base of neck superficial. 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   right lateral chest, EDC 12/09/20   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(HCC)    Venous stasis    Venous stasis    Vitamin B 12 deficiency    Past Surgical History:  Procedure Laterality Date   BIOPSY  05/30/2023   Procedure: BIOPSY;  Surgeon: Avram Lupita BRAVO, MD;  Location: [facility] ENDOSCOPY;  Service: Gastroenterology;;   COLONOSCOPY W/ POLYPECTOMY     COLONOSCOPY WITH PROPOFOL  N/A 04/05/2018   Procedure: COLONOSCOPY WITH PROPOFOL ;  Surgeon: Viktoria Lamar DASEN, MD;  Location: [facility] ENDOSCOPY;  Service: Endoscopy;  Laterality: N/A;   COLOSTOMY     COLOSTOMY REVERSAL     CYST REMOVAL LEG     ENDOSCOPIC RETROGRADE CHOLANGIOPANCREATOGRAPHY (ERCP) WITH PROPOFOL  N/A 05/30/2023   Procedure: ENDOSCOPIC RETROGRADE CHOLANGIOPANCREATOGRAPHY (ERCP) WITH PROPOFOL ;  Surgeon: Avram Lupita BRAVO, MD;  Location: [facility] ENDOSCOPY;  Service: Gastroenterology;  Laterality: N/A;   KNEE ARTHROPLASTY Right 12/27/2015   Procedure: COMPUTER ASSISTED TOTAL KNEE ARTHROPLASTY;  Surgeon: Lynwood SHAUNNA Hue, MD;  Location: [facility] ORS;  Service: Orthopedics;  Laterality: Right;   KNEE ARTHROSCOPY Left    LIPOMA EXCISION     right thigh   LITHOTRIPSY     PANCREATIC STENT PLACEMENT  05/30/2023   Procedure: PANCREATIC STENT PLACEMENT;  Surgeon: Avram Lupita BRAVO, MD;  Location: [facility] ENDOSCOPY;  Service: Gastroenterology;;   TOTAL KNEE ARTHROPLASTY Left 10/16/2012   Patient Active Problem List   Diagnosis Date Noted  Protein-calorie malnutrition, severe 06/02/2023   Calculus of GB and bile duct w acute cholecyst w/o obst 06/02/2023   Acute pancreatitis without infection or necrosis 06/01/2023   Post-ERCP acute pancreatitis 05/31/2023   Choledocholithiasis 05/31/2023   Gastritis and gastroduodenitis 05/30/2023   Atrial fibrillation (HCC) 05/30/2023   Elevated bilirubin 05/29/2023   Generalized abdominal pain 05/29/2023   Cholelithiasis with choledocholithiasis 05/29/2023   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   Elevated LFTs 05/28/2023   OSA on CPAP 05/28/2023   Ascending aortic aneurysm (HCC) 07/26/2020   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  Rationale for Evaluation and Treatment: Rehabilitation  SUBJECTIVE:                                                                                                                                                                                             SUBJECTIVE STATEMENT: Pt reports that he felt okay after his evaluation on Monday. Pt states that he had some soreness after previous session that lasted until the next day.  Pt accompanied by: self  PERTINENT HISTORY:   Pt known to clinic (see previous hx below), now returns s/p hepatectomy resection of liver, right lobectomy 02/05/2024.  Prior to surgery with previous bout of PT: The pt is a pleasant 78 yo male presenting to PT with concerns of weakness with recent findings of liver metastasis with unknown primary site. Pt reports gallbladder attack July 14th 2024, went to ED, reports they found a stone blockage. Pt then transferred to [facility] where ERCP was performed to remove stones of common bile duct. This was complicated by pancreatitis, heart failure and new onset of a-fib. He reports MRI taken at time where they found cancer in his liver. Pt has undergone 8 sessions of chemo, he also has a cholecystostomy tube that rests on his R side and down RLE. Pt reports future plans for procedure to cut off blood supply to R side of his liver and to remove gallbladder. Pt also with recent R portal vein, R hepatic vein embolization 12/04/23... Pt with hx of ascending  thoracic aneurysm  (4.4 cm) which is under surveillance...  PMH significant for CA, mets to liver, HF, hx of 2 knee replacements (years ago), a-fib, AAA, basal cell carcinoma, chronic kidney disease, diverticulitis, HTN, melanoma, OA, sleep apnea, vitamin b12 deficiency, chemo 07/2023, 10/2023 cholecystostomy tube placement, 12/04/2023 R portal vein, R hepatic vein emobolization, please refer to chart for full details.   PAIN:  Are you having pain? Reports occ abdominal pain   PRECAUTIONS:  Pt previously had 10# lifting restrictions, but reports those have since been removed, but to progress activity gradually. He says he has not currently worked with more than 3# weights with previous HH PT. Pt also did just recently have cholecystostomy tube removed as of last Monday.  Hx of ascending thoracic aneurysm under surveillance    RED FLAGS: Pt with hx of abdominal aneurysm but it is unchanged  WEIGHT BEARING RESTRICTIONS: No  FALLS: Has patient fallen in last 6 months?  LIVING ENVIRONMENT: via chart (from previous outpatient PT course) Lives with: lives with their spouse and has a dog Lives in: House/apartment Stairs: 2 external steps with handrail on L going up Has following equipment at home: shower chair  PLOF: Reports did need some assistance with ADLs until drain recently removed  PATIENT GOALS:  Pt wants to get stronger, get rid of some excess fluid.  OBJECTIVE:  Note: Objective measures were completed at Evaluation unless otherwise noted.  DIAGNOSTIC FINDINGS: via chart   CT abdomen pelvis 04/04/2024 Findings:   - Lower Thorax: No suspicious pulmonary abnormalities. Similar right  pleural thickening with trace effusion.   - Liver: Similar findings of prior right hepatic lobe resection.  Percutaneous drainage catheter is in place with continued reduction in size  of postoperative fluid collection, now measuring approximately 5.7 x 3.3  cm, previously 7.7 x 4.8 cm. Tiny cysts elsewhere in the  liver without  findings of recurrence. No suspicious hepatic masses are identified.  The  portal and hepatic veins are patent.   - Biliary and Gallbladder: No intrahepatic or extrahepatic bile duct  dilation. The gallbladder surgically absent.   - Spleen: Normal in appearance.    - Pancreas: Numerous cystic lesions scattered throughout the spleen,  stable, measuring up to 2.4 cm in the tail. No solid lesions or pancreatic  ductal dilation.   - Adrenal Glands: Normal in appearance.   - Kidneys: Similar benign renal cysts bilaterally measuring up to 9.2 cm in  the lower pole of the right kidney. No suspicious renal lesions. No  hydronephrosis.   - Abdominal and Pelvic Vasculature: No abdominal aortic aneurysm. Moderate  atherosclerotic plaque.   - Gastrointestinal Tract: No abnormal dilation or wall thickening.   - Peritoneum/Mesentery/Retroperitoneum: No free fluid.  No free  intraperitoneal air.   - Lymph Nodes: No retroperitoneal, mesenteric, pelvic, or inguinal  lymphadenopathy.    - Bladder: Similar intraluminal bladder calculi with chronic bladder wall  thickening.   - Pelvic Organs: Unremarkable prostate.   - Body Wall: Continued interval decrease in size of right chest/abdominal  wall hematoma, measuring 9.1 x 2.0 cm, previously 13.2 x 3.6 cm.   - Musculoskeletal:  No aggressive appearing osseous lesions. There are  moderate degenerative changes of the lumbosacral spine.   Impression:  1. Continued interval reduction in size of right upper quadrant fluid  collection and chest/abdominal wall hematoma.  2. No evidence of metastatic disease in abdomen or pelvis.   Electronically Signed by:  Toya Irving, MD, [facility]  Electronically  Signed on:  04/04/2024 12:26 PM   COGNITION: Overall cognitive status: Within functional limits for tasks assessed   SENSATION: Reports no n/t anywhere   COORDINATION:  WFL rapid alt movement BUE/BLE Heel>shin WNL bilat  LE  EDEMA:  Pt with bilat ankle swelling (reports since surgery), planning to get compression socks     POSTURE: Slight hunched/fwd posture and rounded shoulders    LOWER EXTREMITY MMT:    MMT Right Eval Left Eval  Hip flexion 4 4  Hip extension    Hip abduction 4+ 4+  Hip adduction 4 4+  Hip internal rotation    Hip external rotation    Knee flexion 5 5  Knee extension 5 5  Ankle dorsiflexion 4+ 4+  Ankle plantarflexion 5 5  Ankle inversion    Ankle eversion    (Blank rows = not tested)  STAIRS: Not assessed on this date GAIT: No AD, pt with decreased R heel strike, decreased bilateral hip extension, and flexed/hunched posture.   FUNCTIONAL TESTS:  5 times sit to stand: 12.5 seconds with use of UE  : 1.23 m/s  :  1066 ft without an AD FGA: assessed on 04/30/24:       PATIENT SURVEYS:  ABC scale  today 91.9%                                                                                                                              TREATMENT DATE: 05/07/24  TE  Nustep level 1 x 1 min level 3 x 2 min, level 4 x 1 min   Exercise/Activity Sets/ Reps/Time/ Resistance Assistance Charge type Comments  Marching on airex  2 sets, 10 x each  Neuro re-ed   Tandem stance 3 x 45 sec  Neuro re-ed   Airex step up laterally  10 x ea   Neuro re-ed   SLS progression on airex  2 x 30 sec ea   Neuro re-ed   Seated LAQ  2 x 10 x 3 sec hold with 4# AW   TE Med difficulty   Resisted gait with 4#AW  X320 ft  TA Cues for picking up feet for adequte step length and foot clearance   Trampoline stance with ball toss and mini squat hold  X 10   NMR Good reactions in ankle and hip musculature   Balance obstacle course - hurdles, airex, step up ( 4 and 7 in), cones X 4 laps, cue to increase speed last rep   NMR   Treatment provided this session   Pt educated throughout session about proper posture and technique with exercises. Improved exercise technique, movement at  target joints, use of target muscles after min to mod verbal, visual, tactile cues.  Note: Portions of this document were prepared using Dragon voice recognition software and although reviewed may contain unintentional dictation errors in syntax, grammar, or spelling.       PATIENT EDUCATION: Education details:further assessment findings,  HEP Person educated: Patient Education method: Explanation, Demonstration, Verbal cues, and Handouts Education comprehension: verbalized understanding and returned demonstration  HOME EXERCISE PROGRAM: Access Code: [email or web address] URL: [email or web address] Date: 04/30/2024 Prepared by: Darryle Patten  Exercises - Mini Squat with Counter Support  - 1 x daily - 3 x weekly - 2 sets - 10 reps - Lunge with Counter Support  - 1 x daily - 3 x weekly - 2 sets - 10 reps   Access Code: [email or web address] URL: [email or web address] Date: 04/28/2024 Prepared by: Darryle Patten  Exercises - Standing March with Counter Support  - 1-2 x daily - 3-5 x weekly - 2 sets - 10 reps - Standing Hip Abduction with Counter Support  - 1-2 x daily - 3-5 x weekly - 2 sets - 10 reps  GOALS: Goals reviewed with patient? Yes   SHORT TERM GOALS: Target date: 06/09/2024    Patient will be independent in home exercise program to improve strength/mobility for better functional independence with ADLs. Baseline: initiated  Goal status: INITIAL   LONG TERM GOALS: Target date: 07/21/2024   Patient will increase ABC scale score >95% to demonstrate better functional mobility and better confidence with ADLs and return to confidence levels pt achieved with previous bout of therapy Baseline:  today 91.9% (prior to previous PT d/c reached 96%) Goal status: INITIAL  2.  Patient will complete five times sit to stand test in < 10 seconds indicating an increased LE strength and mm power. Baseline: 12.5 seconds with use of UE (previous PT course pt achieved 10.6 sec) Goal  status: INITIAL  3. Patient will increase Functional Gait Assessment score to >27/30 as to reduce fall risk and improve dynamic gait safety with community ambulation  Baseline: 21/30- performed on 04/30/24 Goal status: INITIAL  4.  Patient will increase 10 meter walk test to >1.2 m/s as to improve gait speed for better community ambulation and return to or exceed level of function reached with previous bout of PT. Baseline: today at eval 1.23 m/s (previous PT reached 2.1 m/s on 3/19) Goal status: INITIAL  5.  Patient will increase six minute walk test distance to >1500 ft to achieve or exceed PLOF and improve functional capacity  Baseline: 1066 ft without AD (in previous bout of PT achieved 1596 ft) Goal status: INITIAL     ASSESSMENT:  CLINICAL IMPRESSION:  Patient was pleasant and arrived with good motivation for completion of pt activities.  Pt tolerated progressive dynamic balance and obstacle navigation well. Added exercise to continue to increase his LE strength and endurance. Pt has most difficulty with basic strengthening this date.  The pt will benefit from further skilled PT to improve the aforementioned deficits in order increase strength, gait speed/functional capacity, balance and balance confidence, and return to PLOF.   OBJECTIVE IMPAIRMENTS: Abnormal gait, decreased activity tolerance, decreased balance, decreased endurance, difficulty walking, decreased strength, increased edema, improper body mechanics, postural dysfunction, and pain.   ACTIVITY LIMITATIONS: carrying, lifting, squatting, stairs, transfers, and locomotion level  PARTICIPATION LIMITATIONS: cleaning, shopping, community activity, and yard work  PERSONAL FACTORS: Age, Time since onset of injury/illness/exacerbation, and 3+ comorbidities: PMH significant for CA, mets to liver, HF, hx of 2 knee replacements (years ago), a-fib, AAA, basal cell carcinoma, chronic kidney disease, diverticulitis, HTN, melanoma,  OA, sleep apnea, vitamin b12 deficiency, chemo 07/2023, 10/2023 cholecystostomy tube placement, 12/04/2023 R portal vein, R hepatic vein emobolization, please refer to chart for full details. are also affecting patient's functional outcome.   REHAB  POTENTIAL: Good  CLINICAL DECISION MAKING: Evolving/moderate complexity  EVALUATION COMPLEXITY: Moderate  PLAN:  PT FREQUENCY: 1-2x/week  PT DURATION: 12 weeks  PLANNED INTERVENTIONS: 97164- PT Re-evaluation, 97750- Physical Performance Testing, 97110-Therapeutic exercises, 97530- Therapeutic activity, [id number]- Neuromuscular re-education, 97535- Self Care, [id number]- Manual therapy, [phone]- Gait training, [phone]- Orthotic Initial, [phone]- Orthotic/Prosthetic subsequent, [phone]- Canalith repositioning, Patient/Family education, Balance training, Stair training, Taping, Joint mobilization, Spinal mobilization, Scar mobilization, Vestibular training, DME instructions, Cryotherapy, and Moist heat  PLAN FOR NEXT SESSION: add to HEP, strength, endurance, progressive dynamic balance training   Lonni KATHEE Gainer, PT 05/07/2024, 1:23 PM

## 2024-05-08 ENCOUNTER — Ambulatory Visit: Admitting: Physical Therapy

## 2024-05-08 DIAGNOSIS — R2681 Unsteadiness on feet: Secondary | ICD-10-CM

## 2024-05-08 DIAGNOSIS — R262 Difficulty in walking, not elsewhere classified: Secondary | ICD-10-CM

## 2024-05-08 DIAGNOSIS — M6281 Muscle weakness (generalized): Secondary | ICD-10-CM

## 2024-05-08 NOTE — Therapy (Signed)
 OUTPATIENT PHYSICAL THERAPY NEURO Treatment   Patient Name: Alexander Howe MRN: [id number] DOB:07-18-1946, 78 y.o., male Today's Date: 05/08/2024   PCP: Fernande Ophelia JINNY DOUGLAS, MD  REFERRING PROVIDER: Fernande Ophelia JINNY DOUGLAS, MD   END OF SESSION:  PT End of Session - 05/08/24 1447     Visit Number 4    Number of Visits 25    Date for PT Re-Evaluation 07/21/24    Progress Note Due on Visit 10    PT Start Time 1450    PT Stop Time 1530    PT Time Calculation (min) 40 min    Equipment Utilized During Treatment Gait belt    Activity Tolerance Patient tolerated treatment well    Behavior During Therapy WFL for tasks assessed/performed           Past Medical History:  Diagnosis Date   AAA (abdominal aortic aneurysm) without rupture (HCC)    Abdominal aneurysm (HCC)    3.5 cm   Actinic keratosis 08/26/2020   right lateral chest   Anxiety    Atrial fibrillation (HCC) 05/30/2023   Basal cell carcinoma 09/18/2018   left upper back/one excised, one superficial   Basal cell carcinoma 11/28/2018   left post shoulder   Basal cell carcinoma 08/26/2020   left medial pretibial, EDC 10/2020   Basal cell carcinoma 03/15/2022   right neck, EDC   Basal cell carcinoma (BCC) 08/31/2022   Posterior base of neck superficial. 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   right lateral chest, EDC 12/09/20   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(HCC)    Venous stasis    Venous stasis    Vitamin B 12 deficiency    Past Surgical History:  Procedure Laterality Date   BIOPSY  05/30/2023   Procedure: BIOPSY;  Surgeon: Avram Lupita BRAVO, MD;  Location: [facility] ENDOSCOPY;  Service: Gastroenterology;;   COLONOSCOPY W/ POLYPECTOMY     COLONOSCOPY WITH PROPOFOL  N/A 04/05/2018   Procedure: COLONOSCOPY WITH PROPOFOL ;  Surgeon: Viktoria Lamar DASEN, MD;  Location: [facility] ENDOSCOPY;  Service: Endoscopy;  Laterality: N/A;   COLOSTOMY     COLOSTOMY REVERSAL     CYST REMOVAL LEG     ENDOSCOPIC RETROGRADE CHOLANGIOPANCREATOGRAPHY (ERCP) WITH PROPOFOL  N/A 05/30/2023   Procedure: ENDOSCOPIC RETROGRADE CHOLANGIOPANCREATOGRAPHY (ERCP) WITH PROPOFOL ;  Surgeon: Avram Lupita BRAVO, MD;  Location: [facility] ENDOSCOPY;  Service: Gastroenterology;  Laterality: N/A;   KNEE ARTHROPLASTY Right 12/27/2015   Procedure: COMPUTER ASSISTED TOTAL KNEE ARTHROPLASTY;  Surgeon: Lynwood SHAUNNA Hue, MD;  Location: [facility] ORS;  Service: Orthopedics;  Laterality: Right;   KNEE ARTHROSCOPY Left    LIPOMA EXCISION     right thigh   LITHOTRIPSY     PANCREATIC STENT PLACEMENT  05/30/2023   Procedure: PANCREATIC STENT PLACEMENT;  Surgeon: Avram Lupita BRAVO, MD;  Location: [facility] ENDOSCOPY;  Service: Gastroenterology;;   TOTAL KNEE ARTHROPLASTY Left 10/16/2012   Patient Active Problem List   Diagnosis Date Noted  Protein-calorie malnutrition, severe 06/02/2023   Calculus of GB and bile duct w acute cholecyst w/o obst 06/02/2023   Acute pancreatitis without infection or necrosis 06/01/2023   Post-ERCP acute pancreatitis 05/31/2023   Choledocholithiasis 05/31/2023   Gastritis and gastroduodenitis 05/30/2023   Atrial fibrillation (HCC) 05/30/2023   Elevated bilirubin 05/29/2023   Generalized abdominal pain 05/29/2023   Cholelithiasis with choledocholithiasis 05/29/2023   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   Elevated LFTs 05/28/2023   OSA on CPAP 05/28/2023   Ascending aortic aneurysm (HCC) 07/26/2020   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   S/P total knee arthroplasty 12/27/2015   Major depressive disorder with single episode, in full remission (HCC) 12/23/2015   Anxiety  11/16/2015   Benign essential hypertension 11/16/2015   Depression 11/16/2015   Hypersomnia with sleep apnea 11/16/2015   Osteoarthritis 11/16/2015   Pure hypercholesterolemia 11/16/2015   Abdominal aortic aneurysm without rupture (HCC) 12/23/2014   Chronic kidney disease, stage 3 unspecified (HCC) 12/17/2014    ONSET DATE: 02/05/24  REFERRING DIAG:  Diagnosis  E44.1 (ICD-10-CM) - Mild protein-calorie malnutrition    THERAPY DIAG:  Muscle weakness (generalized)  Difficulty in walking, not elsewhere classified  Unsteadiness on feet  Rationale for Evaluation and Treatment: Rehabilitation  SUBJECTIVE:                                                                                                                                                                                             SUBJECTIVE STATEMENT: Pt reports that he is hurting today following last PT session with soreness from thighs through mid section Rates soreness 7/10.   Pt accompanied by: self  PERTINENT HISTORY:   Pt known to clinic (see previous hx below), now returns s/p hepatectomy resection of liver, right lobectomy 02/05/2024.  Prior to surgery with previous bout of PT: The pt is a pleasant 78 yo male presenting to PT with concerns of weakness with recent findings of liver metastasis with unknown primary site. Pt reports gallbladder attack July 14th 2024, went to ED, reports they found a stone blockage. Pt then transferred to [facility] where ERCP was performed to remove stones of common bile duct. This was complicated by pancreatitis, heart failure and new onset of a-fib. He reports MRI taken at time where they found cancer in his liver. Pt has undergone 8 sessions of chemo, he also has a cholecystostomy tube that rests on his R side and down RLE. Pt reports future plans for procedure to cut off blood supply to R side of his liver and to remove gallbladder. Pt also with recent R portal vein, R hepatic vein  embolization 12/04/23... 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  PMH significant for CA, mets to liver, HF, hx of 2 knee replacements (years ago), a-fib, AAA, basal cell carcinoma, chronic kidney disease, diverticulitis, HTN, melanoma, OA, sleep apnea, vitamin b12 deficiency, chemo 07/2023, 10/2023 cholecystostomy tube placement, 12/04/2023 R portal vein, R hepatic vein emobolization, please refer to chart for full details.   PAIN:  Are you having pain? Reports occ abdominal pain   PRECAUTIONS:  Pt previously had 10# lifting restrictions, but reports those have since been removed, but to progress activity gradually. He says he has not currently worked with more than 3# weights with previous HH PT. Pt also did just recently have cholecystostomy tube removed as of last Monday.  Hx of ascending thoracic aneurysm under surveillance    RED FLAGS: Pt with hx of abdominal aneurysm but it is unchanged  WEIGHT BEARING RESTRICTIONS: No  FALLS: Has patient fallen in last 6 months?  LIVING ENVIRONMENT: via chart (from previous outpatient PT course) Lives with: lives with their spouse and has a dog Lives in: House/apartment Stairs: 2 external steps with handrail on L going up Has following equipment at home: shower chair  PLOF: Reports did need some assistance with ADLs until drain recently removed  PATIENT GOALS:  Pt wants to get stronger, get rid of some excess fluid.  OBJECTIVE:  Note: Objective measures were completed at Evaluation unless otherwise noted.  DIAGNOSTIC FINDINGS: via chart   CT abdomen pelvis 04/04/2024 Findings:   - Lower Thorax: No suspicious pulmonary abnormalities. Similar right  pleural thickening with trace effusion.   - Liver: Similar findings of prior right hepatic lobe resection.  Percutaneous drainage catheter is in place with continued reduction in size  of postoperative fluid collection, now measuring approximately  5.7 x 3.3  cm, previously 7.7 x 4.8 cm. Tiny cysts elsewhere in the liver without  findings of recurrence. No suspicious hepatic masses are identified.  The  portal and hepatic veins are patent.   - Biliary and Gallbladder: No intrahepatic or extrahepatic bile duct  dilation. The gallbladder surgically absent.   - Spleen: Normal in appearance.    - Pancreas: Numerous cystic lesions scattered throughout the spleen,  stable, measuring up to 2.4 cm in the tail. No solid lesions or pancreatic  ductal dilation.   - Adrenal Glands: Normal in appearance.   - Kidneys: Similar benign renal cysts bilaterally measuring up to 9.2 cm in  the lower pole of the right kidney. No suspicious renal lesions. No  hydronephrosis.   - Abdominal and Pelvic Vasculature: No abdominal aortic aneurysm. Moderate  atherosclerotic plaque.   - Gastrointestinal Tract: No abnormal dilation or wall thickening.   - Peritoneum/Mesentery/Retroperitoneum: No free fluid.  No free  intraperitoneal air.   - Lymph Nodes: No retroperitoneal, mesenteric, pelvic, or inguinal  lymphadenopathy.    - Bladder: Similar intraluminal bladder calculi with chronic bladder wall  thickening.   - Pelvic Organs: Unremarkable prostate.   - Body Wall: Continued interval decrease in size of right chest/abdominal  wall hematoma, measuring 9.1 x 2.0 cm, previously 13.2 x 3.6 cm.   - Musculoskeletal:  No aggressive appearing osseous lesions. There are  moderate degenerative changes of the lumbosacral spine.   Impression:  1. Continued interval reduction in size of right upper quadrant fluid  collection and chest/abdominal wall hematoma.  2. No evidence of metastatic disease in abdomen or pelvis.   Electronically Signed by:  Toya  Ulysess, MD, [facility]  Electronically Signed on:  04/04/2024 12:26 PM   COGNITION: Overall cognitive status: Within functional limits for tasks assessed   SENSATION: Reports no n/t anywhere    COORDINATION:  WFL rapid alt movement BUE/BLE Heel>shin WNL bilat LE  EDEMA:  Pt with bilat ankle swelling (reports since surgery), planning to get compression socks     POSTURE: Slight hunched/fwd posture and rounded shoulders    LOWER EXTREMITY MMT:    MMT Right Eval Left Eval  Hip flexion 4 4  Hip extension    Hip abduction 4+ 4+  Hip adduction 4 4+  Hip internal rotation    Hip external rotation    Knee flexion 5 5  Knee extension 5 5  Ankle dorsiflexion 4+ 4+  Ankle plantarflexion 5 5  Ankle inversion    Ankle eversion    (Blank rows = not tested)  STAIRS: Not assessed on this date GAIT: No AD, pt with decreased R heel strike, decreased bilateral hip extension, and flexed/hunched posture.   FUNCTIONAL TESTS:  5 times sit to stand: 12.5 seconds with use of UE  : 1.23 m/s  :  1066 ft without an AD FGA: assessed on 04/30/24:       PATIENT SURVEYS:  ABC scale  today 91.9%                                                                                                                              TREATMENT DATE: 05/08/24   Standing on airex pad:  Static standing on pad feet shoulder width apart 2 x 45 sec  Moving magnet letters on board to sort 3 x 5 with RUE and 3 x  5 with LUE.  Normal BOS, eyes open, eyes closed 2 x 30 sec  Narrow BOS eyes open eyes closed 2 x 20 sec  Improved COM control   Lateral step up 6 inch step x 8 bil, UE support progressing to no UE support  Sit<>stand without UE support 2 x 5  Forward step up x 8 bil.   Obstacle course x 3 laps. Weave through 5 cones, up/over 6inch step, across airex beam, over 2 half bolsters together and 2 single half bolsters.  CGA- min assist fore safety with airex pad management,      PATIENT EDUCATION: Education details:further assessment findings, HEP.  Pt educated throughout session about proper posture and technique with exercises. Improved exercise technique, movement at  target joints, use of target muscles after min to mod verbal, visual, tactile cues.  Person educated: Patient Education method: Explanation, Demonstration, Verbal cues, and Handouts Education comprehension: verbalized understanding and returned demonstration  HOME EXERCISE PROGRAM: Access Code: [email or web address] URL: [email or web address] Date: 04/30/2024 Prepared by: Darryle Patten  Exercises - Mini Squat with Counter Support  - 1 x daily - 3 x weekly - 2 sets - 10 reps - Lunge with Counter Support  - 1 x daily - 3 x  weekly - 2 sets - 10 reps   Access Code: [email or web address] URL: [email or web address] Date: 04/28/2024 Prepared by: Darryle Patten  Exercises - Standing March with Counter Support  - 1-2 x daily - 3-5 x weekly - 2 sets - 10 reps - Standing Hip Abduction with Counter Support  - 1-2 x daily - 3-5 x weekly - 2 sets - 10 reps  GOALS: Goals reviewed with patient? Yes   SHORT TERM GOALS: Target date: 06/09/2024    Patient will be independent in home exercise program to improve strength/mobility for better functional independence with ADLs. Baseline: initiated  Goal status: INITIAL   LONG TERM GOALS: Target date: 07/21/2024   Patient will increase ABC scale score >95% to demonstrate better functional mobility and better confidence with ADLs and return to confidence levels pt achieved with previous bout of therapy Baseline:  today 91.9% (prior to previous PT d/c reached 96%) Goal status: INITIAL  2.  Patient will complete five times sit to stand test in < 10 seconds indicating an increased LE strength and mm power. Baseline: 12.5 seconds with use of UE (previous PT course pt achieved 10.6 sec) Goal status: INITIAL  3. Patient will increase Functional Gait Assessment score to >27/30 as to reduce fall risk and improve dynamic gait safety with community ambulation  Baseline: 21/30- performed on 04/30/24 Goal status: INITIAL  4.  Patient will increase 10 meter  walk test to >1.2 m/s as to improve gait speed for better community ambulation and return to or exceed level of function reached with previous bout of PT. Baseline: today at eval 1.23 m/s (previous PT reached 2.1 m/s on 3/19) Goal status: INITIAL  5.  Patient will increase six minute walk test distance to >1500 ft to achieve or exceed PLOF and improve functional capacity  Baseline: 1066 ft without AD (in previous bout of PT achieved 1596 ft) Goal status: INITIAL     ASSESSMENT:  CLINICAL IMPRESSION:  Patient was pleasant and arrived with good motivation for completion of pt activities.  Pt tolerated progressive dynamic balance and obstacle navigation well. Limited strength based interventions on this day due to reported soreness in BLE and core from PT treatment 1 day prior.   The pt will benefit from further skilled PT to improve the aforementioned deficits in order increase strength, gait speed/functional capacity, balance and balance confidence, and return to PLOF.   OBJECTIVE IMPAIRMENTS: Abnormal gait, decreased activity tolerance, decreased balance, decreased endurance, difficulty walking, decreased strength, increased edema, improper body mechanics, postural dysfunction, and pain.   ACTIVITY LIMITATIONS: carrying, lifting, squatting, stairs, transfers, and locomotion level  PARTICIPATION LIMITATIONS: cleaning, shopping, community activity, and yard work  PERSONAL FACTORS: Age, Time since onset of injury/illness/exacerbation, and 3+ comorbidities: PMH significant for CA, mets to liver, HF, hx of 2 knee replacements (years ago), a-fib, AAA, basal cell carcinoma, chronic kidney disease, diverticulitis, HTN, melanoma, OA, sleep apnea, vitamin b12 deficiency, chemo 07/2023, 10/2023 cholecystostomy tube placement, 12/04/2023 R portal vein, R hepatic vein emobolization, please refer to chart for full details. are also affecting patient's functional outcome.   REHAB POTENTIAL: Good  CLINICAL  DECISION MAKING: Evolving/moderate complexity  EVALUATION COMPLEXITY: Moderate  PLAN:  PT FREQUENCY: 1-2x/week  PT DURATION: 12 weeks  PLANNED INTERVENTIONS: 97164- PT Re-evaluation, 97750- Physical Performance Testing, 97110-Therapeutic exercises, 97530- Therapeutic activity, [id number]- Neuromuscular re-education, 97535- Self Care, [id number]- Manual therapy, [id number]- Gait training, [phone]- Orthotic Initial, [phone]- Orthotic/Prosthetic subsequent, [phone]- Canalith repositioning, Patient/Family education, Balance training, Stair training, Taping, Joint mobilization,  Spinal mobilization, Scar mobilization, Vestibular training, DME instructions, Cryotherapy, and Moist heat  PLAN FOR NEXT SESSION:   add to HEP, strength, endurance,  progressive dynamic balance training   Massie FORBES Dollar, PT 05/08/2024, 2:51 PM

## 2024-05-12 ENCOUNTER — Ambulatory Visit

## 2024-05-12 ENCOUNTER — Ambulatory Visit: Payer: Medicare Other

## 2024-05-12 DIAGNOSIS — M6281 Muscle weakness (generalized): Secondary | ICD-10-CM

## 2024-05-12 DIAGNOSIS — R262 Difficulty in walking, not elsewhere classified: Secondary | ICD-10-CM

## 2024-05-12 NOTE — Therapy (Signed)
 OUTPATIENT PHYSICAL THERAPY NEURO Treatment   Patient Name: Alexander Howe MRN: [id number] DOB:1946/08/02, 78 y.o., male Today's Date: 05/12/2024   PCP: Fernande Ophelia JINNY DOUGLAS, MD  REFERRING PROVIDER: Fernande Ophelia JINNY DOUGLAS, MD   END OF SESSION:  PT End of Session - 05/12/24 1426     Visit Number 5    Number of Visits 25    Date for PT Re-Evaluation 07/21/24    Progress Note Due on Visit 10    PT Start Time 1403    PT Stop Time 1444    PT Time Calculation (min) 41 min    Equipment Utilized During Treatment --    Activity Tolerance Patient tolerated treatment well    Behavior During Therapy [facility] for tasks assessed/performed            Past Medical History:  Diagnosis Date   AAA (abdominal aortic aneurysm) without rupture (HCC)    Abdominal aneurysm (HCC)    3.5 cm   Actinic keratosis 08/26/2020   right lateral chest   Anxiety    Atrial fibrillation (HCC) 05/30/2023   Basal cell carcinoma 09/18/2018   left upper back/one excised, one superficial   Basal cell carcinoma 11/28/2018   left post shoulder   Basal cell carcinoma 08/26/2020   left medial pretibial, EDC 10/2020   Basal cell carcinoma 03/15/2022   right neck, EDC   Basal cell carcinoma (BCC) 08/31/2022   Posterior base of neck superficial. 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   right lateral chest, EDC 12/09/20   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(HCC)    Venous stasis    Venous stasis    Vitamin B 12 deficiency    Past Surgical History:  Procedure Laterality Date   BIOPSY  05/30/2023   Procedure: BIOPSY;  Surgeon: Avram Lupita BRAVO, MD;  Location: [facility] ENDOSCOPY;  Service: Gastroenterology;;   COLONOSCOPY W/ POLYPECTOMY     COLONOSCOPY WITH PROPOFOL  N/A 04/05/2018   Procedure: COLONOSCOPY WITH PROPOFOL ;  Surgeon: Viktoria Lamar DASEN, MD;  Location: [facility] ENDOSCOPY;  Service: Endoscopy;  Laterality: N/A;   COLOSTOMY     COLOSTOMY REVERSAL     CYST REMOVAL LEG     ENDOSCOPIC RETROGRADE CHOLANGIOPANCREATOGRAPHY (ERCP) WITH PROPOFOL  N/A 05/30/2023   Procedure: ENDOSCOPIC RETROGRADE CHOLANGIOPANCREATOGRAPHY (ERCP) WITH PROPOFOL ;  Surgeon: Avram Lupita BRAVO, MD;  Location: [facility] ENDOSCOPY;  Service: Gastroenterology;  Laterality: N/A;   KNEE ARTHROPLASTY Right 12/27/2015   Procedure: COMPUTER ASSISTED TOTAL KNEE ARTHROPLASTY;  Surgeon: Lynwood SHAUNNA Hue, MD;  Location: [facility] ORS;  Service: Orthopedics;  Laterality: Right;   KNEE ARTHROSCOPY Left    LIPOMA EXCISION     right thigh   LITHOTRIPSY     PANCREATIC STENT PLACEMENT  05/30/2023   Procedure: PANCREATIC STENT PLACEMENT;  Surgeon: Avram Lupita BRAVO, MD;  Location: [facility] ENDOSCOPY;  Service: Gastroenterology;;   TOTAL KNEE ARTHROPLASTY Left 10/16/2012   Patient Active Problem List   Diagnosis Date Noted  Protein-calorie malnutrition, severe 06/02/2023   Calculus of GB and bile duct w acute cholecyst w/o obst 06/02/2023   Acute pancreatitis without infection or necrosis 06/01/2023   Post-ERCP acute pancreatitis 05/31/2023   Choledocholithiasis 05/31/2023   Gastritis and gastroduodenitis 05/30/2023   Atrial fibrillation (HCC) 05/30/2023   Elevated bilirubin 05/29/2023   Generalized abdominal pain 05/29/2023   Cholelithiasis with choledocholithiasis 05/29/2023   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   Elevated LFTs 05/28/2023   OSA on CPAP 05/28/2023   Ascending aortic aneurysm (HCC) 07/26/2020   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   S/P total knee arthroplasty 12/27/2015   Major depressive disorder with single episode, in full remission (HCC) 12/23/2015   Anxiety  11/16/2015   Benign essential hypertension 11/16/2015   Depression 11/16/2015   Hypersomnia with sleep apnea 11/16/2015   Osteoarthritis 11/16/2015   Pure hypercholesterolemia 11/16/2015   Abdominal aortic aneurysm without rupture (HCC) 12/23/2014   Chronic kidney disease, stage 3 unspecified (HCC) 12/17/2014    ONSET DATE: 02/05/24  REFERRING DIAG:  Diagnosis  E44.1 (ICD-10-CM) - Mild protein-calorie malnutrition    THERAPY DIAG:  Muscle weakness (generalized)  Difficulty in walking, not elsewhere classified  Rationale for Evaluation and Treatment: Rehabilitation  SUBJECTIVE:                                                                                                                                                                                             SUBJECTIVE STATEMENT: Pt rates soreness a 3/10. Pt notes ability to ambulate and overall energy level has improved.   Pt accompanied by: self  PERTINENT HISTORY:   Pt known to clinic (see previous hx below), now returns s/p hepatectomy resection of liver, right lobectomy 02/05/2024.  Prior to surgery with previous bout of PT: The pt is a pleasant 78 yo male presenting to PT with concerns of weakness with recent findings of liver metastasis with unknown primary site. Pt reports gallbladder attack July 14th 2024, went to ED, reports they found a stone blockage. Pt then transferred to [facility] where ERCP was performed to remove stones of common bile duct. This was complicated by pancreatitis, heart failure and new onset of a-fib. He reports MRI taken at time where they found cancer in his liver. Pt has undergone 8 sessions of chemo, he also has a cholecystostomy tube that rests on his R side and down RLE. Pt reports future plans for procedure to cut off blood supply to R side of his liver and to remove gallbladder. Pt also with recent R portal vein, R hepatic vein embolization 12/04/23... Pt with hx of ascending thoracic  aneurysm (  4.4 cm) which is under surveillance...  PMH significant for CA, mets to liver, HF, hx of 2 knee replacements (years ago), a-fib, AAA, basal cell carcinoma, chronic kidney disease, diverticulitis, HTN, melanoma, OA, sleep apnea, vitamin b12 deficiency, chemo 07/2023, 10/2023 cholecystostomy tube placement, 12/04/2023 R portal vein, R hepatic vein emobolization, please refer to chart for full details.   PAIN:  Are you having pain? Reports occ abdominal pain   PRECAUTIONS:  Pt previously had 10# lifting restrictions, but reports those have since been removed, but to progress activity gradually. He says he has not currently worked with more than 3# weights with previous HH PT. Pt also did just recently have cholecystostomy tube removed as of last Monday.  Hx of ascending thoracic aneurysm under surveillance    RED FLAGS: Pt with hx of abdominal aneurysm but it is unchanged  WEIGHT BEARING RESTRICTIONS: No  FALLS: Has patient fallen in last 6 months?  LIVING ENVIRONMENT: via chart (from previous outpatient PT course) Lives with: lives with their spouse and has a dog Lives in: House/apartment Stairs: 2 external steps with handrail on L going up Has following equipment at home: shower chair  PLOF: Reports did need some assistance with ADLs until drain recently removed  PATIENT GOALS:  Pt wants to get stronger, get rid of some excess fluid.  OBJECTIVE:  Note: Objective measures were completed at Evaluation unless otherwise noted.  DIAGNOSTIC FINDINGS: via chart   CT abdomen pelvis 04/04/2024 Findings:   - Lower Thorax: No suspicious pulmonary abnormalities. Similar right  pleural thickening with trace effusion.   - Liver: Similar findings of prior right hepatic lobe resection.  Percutaneous drainage catheter is in place with continued reduction in size  of postoperative fluid collection, now measuring approximately 5.7 x 3.3  cm, previously 7.7 x 4.8 cm. Tiny cysts  elsewhere in the liver without  findings of recurrence. No suspicious hepatic masses are identified.  The  portal and hepatic veins are patent.   - Biliary and Gallbladder: No intrahepatic or extrahepatic bile duct  dilation. The gallbladder surgically absent.   - Spleen: Normal in appearance.    - Pancreas: Numerous cystic lesions scattered throughout the spleen,  stable, measuring up to 2.4 cm in the tail. No solid lesions or pancreatic  ductal dilation.   - Adrenal Glands: Normal in appearance.   - Kidneys: Similar benign renal cysts bilaterally measuring up to 9.2 cm in  the lower pole of the right kidney. No suspicious renal lesions. No  hydronephrosis.   - Abdominal and Pelvic Vasculature: No abdominal aortic aneurysm. Moderate  atherosclerotic plaque.   - Gastrointestinal Tract: No abnormal dilation or wall thickening.   - Peritoneum/Mesentery/Retroperitoneum: No free fluid.  No free  intraperitoneal air.   - Lymph Nodes: No retroperitoneal, mesenteric, pelvic, or inguinal  lymphadenopathy.    - Bladder: Similar intraluminal bladder calculi with chronic bladder wall  thickening.   - Pelvic Organs: Unremarkable prostate.   - Body Wall: Continued interval decrease in size of right chest/abdominal  wall hematoma, measuring 9.1 x 2.0 cm, previously 13.2 x 3.6 cm.   - Musculoskeletal:  No aggressive appearing osseous lesions. There are  moderate degenerative changes of the lumbosacral spine.   Impression:  1. Continued interval reduction in size of right upper quadrant fluid  collection and chest/abdominal wall hematoma.  2. No evidence of metastatic disease in abdomen or pelvis.   Electronically Signed by:  Toya Irving, MD, [facility]  Electronically Signed on:  04/04/2024 12:26 PM   COGNITION: Overall cognitive status: Within functional limits for tasks assessed   SENSATION: Reports no n/t anywhere   COORDINATION:  WFL rapid alt movement  BUE/BLE Heel>shin WNL bilat LE  EDEMA:  Pt with bilat ankle swelling (reports since surgery), planning to get compression socks     POSTURE: Slight hunched/fwd posture and rounded shoulders    LOWER EXTREMITY MMT:    MMT Right Eval Left Eval  Hip flexion 4 4  Hip extension    Hip abduction 4+ 4+  Hip adduction 4 4+  Hip internal rotation    Hip external rotation    Knee flexion 5 5  Knee extension 5 5  Ankle dorsiflexion 4+ 4+  Ankle plantarflexion 5 5  Ankle inversion    Ankle eversion    (Blank rows = not tested)  STAIRS: Not assessed on this date GAIT: No AD, pt with decreased R heel strike, decreased bilateral hip extension, and flexed/hunched posture.   FUNCTIONAL TESTS:  5 times sit to stand: 12.5 seconds with use of UE  : 1.23 m/s  :  1066 ft without an AD FGA: assessed on 04/30/24:       PATIENT SURVEYS:  ABC scale  today 91.9%                                                                                                                              TREATMENT DATE: 05/12/24   TA: Nustep endurance training: Lvl 1 x 1 min 30 sec Lvl 2 x 1 min Lvl 1 x 1 min  Lvl 2 x 1 min Lvl 1 x 1 min 30 sec  SPM 50s    Circuit 1: STS 10x  Seated march 10x each LE  Seated DF 20x bilat LE  Standing hip abduction 8x each LE  Gait for endurance x 296 ft   Circuit 2: STS 8x  Seated march 8x each LE  Seated DF 20x bilat LE  Standing hip abduction 6x each LE  Gait for endurance x 296 ft   Circuit 3: STS 10x - rates medium  Seated march 10x each LE  Seated DF 20x bilat LE  Standing hip abduction 8x each LE  Gait for endurance x 296 ft - rates medium    Seated thoracic ext over chair 2x10  Standing adductor cross-LE swings 10x each LE       PATIENT EDUCATION: Education details:exercise technique Pt educated throughout session about proper posture and technique with exercises. Improved exercise technique, movement at target joints,  use of target muscles after min to mod verbal, visual, tactile cues.  Person educated: Patient Education method: Explanation, Demonstration, and Verbal cues Education comprehension: verbalized understanding and returned demonstration  HOME EXERCISE PROGRAM: Access Code: [email or web address] URL: [email or web address] Date: 04/30/2024 Prepared by: Darryle Patten  Exercises - Mini Squat with Counter Support  - 1 x daily - 3 x weekly - 2 sets - 10 reps -  Lunge with Counter Support  - 1 x daily - 3 x weekly - 2 sets - 10 reps   Access Code: [email or web address] URL: [email or web address] Date: 04/28/2024 Prepared by: Darryle Patten  Exercises - Standing March with Counter Support  - 1-2 x daily - 3-5 x weekly - 2 sets - 10 reps - Standing Hip Abduction with Counter Support  - 1-2 x daily - 3-5 x weekly - 2 sets - 10 reps  GOALS: Goals reviewed with patient? Yes   SHORT TERM GOALS: Target date: 06/09/2024    Patient will be independent in home exercise program to improve strength/mobility for better functional independence with ADLs. Baseline: initiated  Goal status: INITIAL   LONG TERM GOALS: Target date: 07/21/2024   Patient will increase ABC scale score >95% to demonstrate better functional mobility and better confidence with ADLs and return to confidence levels pt achieved with previous bout of therapy Baseline:  today 91.9% (prior to previous PT d/c reached 96%) Goal status: INITIAL  2.  Patient will complete five times sit to stand test in < 10 seconds indicating an increased LE strength and mm power. Baseline: 12.5 seconds with use of UE (previous PT course pt achieved 10.6 sec) Goal status: INITIAL  3. Patient will increase Functional Gait Assessment score to >27/30 as to reduce fall risk and improve dynamic gait safety with community ambulation  Baseline: 21/30- performed on 04/30/24 Goal status: INITIAL  4.  Patient will increase 10 meter walk test to >1.2 m/s as  to improve gait speed for better community ambulation and return to or exceed level of function reached with previous bout of PT. Baseline: today at eval 1.23 m/s (previous PT reached 2.1 m/s on 3/19) Goal status: INITIAL  5.  Patient will increase six minute walk test distance to >1500 ft to achieve or exceed PLOF and improve functional capacity  Baseline: 1066 ft without AD (in previous bout of PT achieved 1596 ft) Goal status: INITIAL     ASSESSMENT:  CLINICAL IMPRESSION: Initiated circuit training to target strength and endurance deficits. Pt tolerated these well, with no increased pain and rating exercises at most a medium difficulty. PT also instructed pt in postural intervention to address FWD/bent posture with gait and at rest.   The pt will benefit from further skilled PT to improve the aforementioned deficits in order increase strength, gait speed/functional capacity, balance and balance confidence, and return to PLOF.   OBJECTIVE IMPAIRMENTS: Abnormal gait, decreased activity tolerance, decreased balance, decreased endurance, difficulty walking, decreased strength, increased edema, improper body mechanics, postural dysfunction, and pain.   ACTIVITY LIMITATIONS: carrying, lifting, squatting, stairs, transfers, and locomotion level  PARTICIPATION LIMITATIONS: cleaning, shopping, community activity, and yard work  PERSONAL FACTORS: Age, Time since onset of injury/illness/exacerbation, and 3+ comorbidities: PMH significant for CA, mets to liver, HF, hx of 2 knee replacements (years ago), a-fib, AAA, basal cell carcinoma, chronic kidney disease, diverticulitis, HTN, melanoma, OA, sleep apnea, vitamin b12 deficiency, chemo 07/2023, 10/2023 cholecystostomy tube placement, 12/04/2023 R portal vein, R hepatic vein emobolization, please refer to chart for full details. are also affecting patient's functional outcome.   REHAB POTENTIAL: Good  CLINICAL DECISION MAKING: Evolving/moderate  complexity  EVALUATION COMPLEXITY: Moderate  PLAN:  PT FREQUENCY: 1-2x/week  PT DURATION: 12 weeks  PLANNED INTERVENTIONS: [id number]- PT Re-evaluation, 97750- Physical Performance Testing, 97110-Therapeutic exercises, 97530- Therapeutic activity, [id number]- Neuromuscular re-education, 97535- Self Care, [id number]- Manual therapy, [id number]- Gait training, [id number]- Orthotic Initial, [id number]- Orthotic/Prosthetic subsequent, [phone]- Canalith repositioning, Patient/Family  education, Balance training, Stair training, Taping, Joint mobilization, Spinal mobilization, Scar mobilization, Vestibular training, DME instructions, Cryotherapy, and Moist heat  PLAN FOR NEXT SESSION:   add to HEP, strength, endurance,  progressive dynamic balance training   Darryle JONELLE Patten, PT 05/12/2024, 2:52 PM

## 2024-05-13 ENCOUNTER — Ambulatory Visit: Admitting: Dermatology

## 2024-05-14 ENCOUNTER — Ambulatory Visit: Payer: Medicare Other

## 2024-05-15 ENCOUNTER — Ambulatory Visit: Attending: Internal Medicine

## 2024-05-15 DIAGNOSIS — M6281 Muscle weakness (generalized): Secondary | ICD-10-CM | POA: Diagnosis present

## 2024-05-15 DIAGNOSIS — R2681 Unsteadiness on feet: Secondary | ICD-10-CM | POA: Diagnosis present

## 2024-05-15 DIAGNOSIS — R262 Difficulty in walking, not elsewhere classified: Secondary | ICD-10-CM | POA: Insufficient documentation

## 2024-05-15 NOTE — Therapy (Signed)
 OUTPATIENT PHYSICAL THERAPY NEURO Treatment   Patient Name: Alexander Howe MRN: [id number] DOB:1946/05/24, 78 y.o., male Today's Date: 05/15/2024   PCP: Fernande Ophelia JINNY DOUGLAS, MD  REFERRING PROVIDER: Fernande Ophelia JINNY DOUGLAS, MD   END OF SESSION:  PT End of Session - 05/15/24 1455     Visit Number 6    Number of Visits 25    Date for PT Re-Evaluation 07/21/24    Progress Note Due on Visit 10    PT Start Time 1448    PT Stop Time 1528    PT Time Calculation (min) 40 min    Equipment Utilized During Treatment Gait belt    Activity Tolerance Patient tolerated treatment well    Behavior During Therapy WFL for tasks assessed/performed             Past Medical History:  Diagnosis Date   AAA (abdominal aortic aneurysm) without rupture (HCC)    Abdominal aneurysm (HCC)    3.5 cm   Actinic keratosis 08/26/2020   right lateral chest   Anxiety    Atrial fibrillation (HCC) 05/30/2023   Basal cell carcinoma 09/18/2018   left upper back/one excised, one superficial   Basal cell carcinoma 11/28/2018   left post shoulder   Basal cell carcinoma 08/26/2020   left medial pretibial, EDC 10/2020   Basal cell carcinoma 03/15/2022   right neck, EDC   Basal cell carcinoma (BCC) 08/31/2022   Posterior base of neck superficial. 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   right lateral chest, EDC 12/09/20   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(HCC)    Venous stasis    Venous stasis    Vitamin B 12 deficiency    Past Surgical History:  Procedure Laterality Date   BIOPSY  05/30/2023   Procedure: BIOPSY;  Surgeon:  Avram Lupita BRAVO, MD;  Location: [facility] ENDOSCOPY;  Service: Gastroenterology;;   COLONOSCOPY W/ POLYPECTOMY     COLONOSCOPY WITH PROPOFOL  N/A 04/05/2018   Procedure: COLONOSCOPY WITH PROPOFOL ;  Surgeon: Viktoria Lamar DASEN, MD;  Location: [facility] ENDOSCOPY;  Service: Endoscopy;  Laterality: N/A;   COLOSTOMY     COLOSTOMY REVERSAL     CYST REMOVAL LEG     ENDOSCOPIC RETROGRADE CHOLANGIOPANCREATOGRAPHY (ERCP) WITH PROPOFOL  N/A 05/30/2023   Procedure: ENDOSCOPIC RETROGRADE CHOLANGIOPANCREATOGRAPHY (ERCP) WITH PROPOFOL ;  Surgeon: Avram Lupita BRAVO, MD;  Location: [facility] ENDOSCOPY;  Service: Gastroenterology;  Laterality: N/A;   KNEE ARTHROPLASTY Right 12/27/2015   Procedure: COMPUTER ASSISTED TOTAL KNEE ARTHROPLASTY;  Surgeon: Lynwood SHAUNNA Hue, MD;  Location: [facility] ORS;  Service: Orthopedics;  Laterality: Right;   KNEE ARTHROSCOPY Left    LIPOMA EXCISION     right thigh   LITHOTRIPSY     PANCREATIC STENT PLACEMENT  05/30/2023   Procedure: PANCREATIC STENT PLACEMENT;  Surgeon: Avram Lupita BRAVO, MD;  Location: [facility] ENDOSCOPY;  Service: Gastroenterology;;   TOTAL KNEE ARTHROPLASTY Left 10/16/2012   Patient Active Problem List   Diagnosis Date Noted  Protein-calorie malnutrition, severe 06/02/2023   Calculus of GB and bile duct w acute cholecyst w/o obst 06/02/2023   Acute pancreatitis without infection or necrosis 06/01/2023   Post-ERCP acute pancreatitis 05/31/2023   Choledocholithiasis 05/31/2023   Gastritis and gastroduodenitis 05/30/2023   Atrial fibrillation (HCC) 05/30/2023   Elevated bilirubin 05/29/2023   Generalized abdominal pain 05/29/2023   Cholelithiasis with choledocholithiasis 05/29/2023   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   Elevated LFTs 05/28/2023   OSA on CPAP 05/28/2023   Ascending aortic aneurysm (HCC) 07/26/2020   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   S/P total knee arthroplasty 12/27/2015   Major depressive disorder with single episode, in full remission (HCC) 12/23/2015    Anxiety 11/16/2015   Benign essential hypertension 11/16/2015   Depression 11/16/2015   Hypersomnia with sleep apnea 11/16/2015   Osteoarthritis 11/16/2015   Pure hypercholesterolemia 11/16/2015   Abdominal aortic aneurysm without rupture (HCC) 12/23/2014   Chronic kidney disease, stage 3 unspecified (HCC) 12/17/2014    ONSET DATE: 02/05/24  REFERRING DIAG:  Diagnosis  E44.1 (ICD-10-CM) - Mild protein-calorie malnutrition    THERAPY DIAG:  Muscle weakness (generalized)  Difficulty in walking, not elsewhere classified  Rationale for Evaluation and Treatment: Rehabilitation  SUBJECTIVE:                                                                                                                                                                                             SUBJECTIVE STATEMENT: Pt reports medication he took prior to PT makes him a little sleepy. No other updates, no pain. He reports he feels ok.  Pt accompanied by: self  PERTINENT HISTORY:   Pt known to clinic (see previous hx below), now returns s/p hepatectomy resection of liver, right lobectomy 02/05/2024.  Prior to surgery with previous bout of PT: The pt is a pleasant 78 yo male presenting to PT with concerns of weakness with recent findings of liver metastasis with unknown primary site. Pt reports gallbladder attack July 14th 2024, went to ED, reports they found a stone blockage. Pt then transferred to [facility] where ERCP was performed to remove stones of common bile duct. This was complicated by pancreatitis, heart failure and new onset of a-fib. He reports MRI taken at time where they found cancer in his liver. Pt has undergone 8 sessions of chemo, he also has a cholecystostomy tube that rests on his R side and down RLE. Pt reports future plans for procedure to cut off blood supply to R side of his liver and to remove gallbladder. Pt also with recent R portal vein, R hepatic vein embolization 12/04/23... 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  PMH significant for CA, mets to liver, HF, hx of 2 knee replacements (years ago), a-fib, AAA, basal cell carcinoma, chronic kidney disease, diverticulitis, HTN, melanoma, OA, sleep apnea, vitamin b12 deficiency, chemo 07/2023, 10/2023 cholecystostomy tube placement, 12/04/2023 R portal vein, R hepatic vein emobolization, please refer to chart for full details.   PAIN:  Are you having pain? Reports occ abdominal pain   PRECAUTIONS:  Pt previously had 10# lifting restrictions, but reports those have since been removed, but to progress activity gradually. He says he has not currently worked with more than 3# weights with previous HH PT. Pt also did just recently have cholecystostomy tube removed as of last Monday.  Hx of ascending thoracic aneurysm under surveillance    RED FLAGS: Pt with hx of abdominal aneurysm but it is unchanged  WEIGHT BEARING RESTRICTIONS: No  FALLS: Has patient fallen in last 6 months?  LIVING ENVIRONMENT: via chart (from previous outpatient PT course) Lives with: lives with their spouse and has a dog Lives in: House/apartment Stairs: 2 external steps with handrail on L going up Has following equipment at home: shower chair  PLOF: Reports did need some assistance with ADLs until drain recently removed  PATIENT GOALS:  Pt wants to get stronger, get rid of some excess fluid.  OBJECTIVE:  Note: Objective measures were completed at Evaluation unless otherwise noted.  DIAGNOSTIC FINDINGS: via chart   CT abdomen pelvis 04/04/2024 Findings:   - Lower Thorax: No suspicious pulmonary abnormalities. Similar right  pleural thickening with trace effusion.   - Liver: Similar findings of prior right hepatic lobe resection.  Percutaneous drainage catheter is in place with continued reduction in size  of postoperative fluid collection, now measuring approximately 5.7 x 3.3  cm, previously 7.7 x  4.8 cm. Tiny cysts elsewhere in the liver without  findings of recurrence. No suspicious hepatic masses are identified.  The  portal and hepatic veins are patent.   - Biliary and Gallbladder: No intrahepatic or extrahepatic bile duct  dilation. The gallbladder surgically absent.   - Spleen: Normal in appearance.    - Pancreas: Numerous cystic lesions scattered throughout the spleen,  stable, measuring up to 2.4 cm in the tail. No solid lesions or pancreatic  ductal dilation.   - Adrenal Glands: Normal in appearance.   - Kidneys: Similar benign renal cysts bilaterally measuring up to 9.2 cm in  the lower pole of the right kidney. No suspicious renal lesions. No  hydronephrosis.   - Abdominal and Pelvic Vasculature: No abdominal aortic aneurysm. Moderate  atherosclerotic plaque.   - Gastrointestinal Tract: No abnormal dilation or wall thickening.   - Peritoneum/Mesentery/Retroperitoneum: No free fluid.  No free  intraperitoneal air.   - Lymph Nodes: No retroperitoneal, mesenteric, pelvic, or inguinal  lymphadenopathy.    - Bladder: Similar intraluminal bladder calculi with chronic bladder wall  thickening.   - Pelvic Organs: Unremarkable prostate.   - Body Wall: Continued interval decrease in size of right chest/abdominal  wall hematoma, measuring 9.1 x 2.0 cm, previously 13.2 x 3.6 cm.   - Musculoskeletal:  No aggressive appearing osseous lesions. There are  moderate degenerative changes of the lumbosacral spine.   Impression:  1. Continued interval reduction in size of right upper quadrant fluid  collection and chest/abdominal wall hematoma.  2. No evidence of metastatic disease in abdomen or pelvis.   Electronically Signed by:  Toya Irving, MD, [facility]  Electronically Signed on:  04/04/2024 12:26 PM   COGNITION: Overall cognitive status: Within functional limits for tasks assessed   SENSATION: Reports no n/t anywhere   COORDINATION:  WFL rapid alt  movement BUE/BLE Heel>shin WNL bilat LE  EDEMA:  Pt with bilat ankle swelling (reports since surgery), planning to get compression socks     POSTURE: Slight hunched/fwd posture and rounded shoulders    LOWER EXTREMITY MMT:    MMT Right Eval Left Eval  Hip flexion 4 4  Hip extension    Hip abduction 4+ 4+  Hip adduction 4 4+  Hip internal rotation    Hip external rotation    Knee flexion 5 5  Knee extension 5 5  Ankle dorsiflexion 4+ 4+  Ankle plantarflexion 5 5  Ankle inversion    Ankle eversion    (Blank rows = not tested)  STAIRS: Not assessed on this date GAIT: No AD, pt with decreased R heel strike, decreased bilateral hip extension, and flexed/hunched posture.   FUNCTIONAL TESTS:  5 times sit to stand: 12.5 seconds with use of UE  : 1.23 m/s  :  1066 ft without an AD FGA: assessed on 04/30/24:       PATIENT SURVEYS:  ABC scale  today 91.9%                                                                                                                              TREATMENT DATE: 05/15/24   TA: Nustep endurance training: Lvl 1 x 1 min 30 sec Lvl 2 x 1 min Lvl 3 x 1 min  Lvl 4 x 1 min Lvl 2 x 1 min 30 sec  SPM 40s-50s    2# aw each LE Circuit 1: STS 10x  Seated march 10x each LE  Standing hip abduction 6x each LE  Gait for endurance x 148 ft  Comments: Pt rates medium   2# aw each LE Circuit 2: STS 8x  Seated march 8x each LE  Seated DF 20x bilat LE  Standing hip abduction 6x each LE  Gait for endurance x 296 ft   Circuit 3: STS 10x - rates medium  Seated march 10x each LE  Seated DF 20x bilat LE  Standing hip abduction 8x each LE  Gait for endurance x 296 ft - rates medium   Seated thoracic ext over chair 1x10  Seated scapular squeezes 10x bilat UE   PT expresses concerns over pt reported sleepiness due to medication and asks pt if PT can call his spouse to come pick him up from therapy instead of pt driving home.  Pt politely declines 2x, says he feels OK & plans to drive home despite PT concern.   Rest breaks provided throughout session  PATIENT EDUCATION: Education details:exercise technique Pt educated throughout session about proper posture and technique with exercises. Improved exercise technique, movement at target joints, use of target muscles after min to mod verbal,  visual, tactile cues.  Person educated: Patient Education method: Explanation, Demonstration, and Verbal cues Education comprehension: verbalized understanding and returned demonstration  HOME EXERCISE PROGRAM: Access Code: [email or web address] URL: [email or web address] Date: 04/30/2024 Prepared by: Darryle Patten  Exercises - Mini Squat with Counter Support  - 1 x daily - 3 x weekly - 2 sets - 10 reps - Lunge with Counter Support  - 1 x daily - 3 x weekly - 2 sets - 10 reps   Access Code: [email or web address] URL: [email or web address] Date: 04/28/2024 Prepared by: Darryle Patten  Exercises - Standing March with Counter Support  - 1-2 x daily - 3-5 x weekly - 2 sets - 10 reps - Standing Hip Abduction with Counter Support  - 1-2 x daily - 3-5 x weekly - 2 sets - 10 reps  GOALS: Goals reviewed with patient? Yes   SHORT TERM GOALS: Target date: 06/09/2024    Patient will be independent in home exercise program to improve strength/mobility for better functional independence with ADLs. Baseline: initiated  Goal status: INITIAL   LONG TERM GOALS: Target date: 07/21/2024   Patient will increase ABC scale score >95% to demonstrate better functional mobility and better confidence with ADLs and return to confidence levels pt achieved with previous bout of therapy Baseline:  today 91.9% (prior to previous PT d/c reached 96%) Goal status: INITIAL  2.  Patient will complete five times sit to stand test in < 10 seconds indicating an increased LE strength and mm power. Baseline: 12.5 seconds with use of UE (previous PT  course pt achieved 10.6 sec) Goal status: INITIAL  3. Patient will increase Functional Gait Assessment score to >27/30 as to reduce fall risk and improve dynamic gait safety with community ambulation  Baseline: 21/30- performed on 04/30/24 Goal status: INITIAL  4.  Patient will increase 10 meter walk test to >1.2 m/s as to improve gait speed for better community ambulation and return to or exceed level of function reached with previous bout of PT. Baseline: today at eval 1.23 m/s (previous PT reached 2.1 m/s on 3/19) Goal status: INITIAL  5.  Patient will increase six minute walk test distance to >1500 ft to achieve or exceed PLOF and improve functional capacity  Baseline: 1066 ft without AD (in previous bout of PT achieved 1596 ft) Goal status: INITIAL     ASSESSMENT:  CLINICAL IMPRESSION: Pt able to progress circuit training with slight increase in resistance/ankle weights. He tolerated this well without pain or excessive fatigue, but still did require rest breaks. Gait still limited by fatigue and postural impairment. Will continue to address these deficits.  The pt will benefit from further skilled PT to improve the aforementioned deficits in order increase strength, gait speed/functional capacity, balance and balance confidence, and return to PLOF.   OBJECTIVE IMPAIRMENTS: Abnormal gait, decreased activity tolerance, decreased balance, decreased endurance, difficulty walking, decreased strength, increased edema, improper body mechanics, postural dysfunction, and pain.   ACTIVITY LIMITATIONS: carrying, lifting, squatting, stairs, transfers, and locomotion level  PARTICIPATION LIMITATIONS: cleaning, shopping, community activity, and yard work  PERSONAL FACTORS: Age, Time since onset of injury/illness/exacerbation, and 3+ comorbidities: PMH significant for CA, mets to liver, HF, hx of 2 knee replacements (years ago), a-fib, AAA, basal cell carcinoma, chronic kidney disease,  diverticulitis, HTN, melanoma, OA, sleep apnea, vitamin b12 deficiency, chemo 07/2023, 10/2023 cholecystostomy tube placement, 12/04/2023 R portal vein, R hepatic vein emobolization, please refer to chart for full details. are also affecting patient's functional outcome.   REHAB POTENTIAL:  Good  CLINICAL DECISION MAKING: Evolving/moderate complexity  EVALUATION COMPLEXITY: Moderate  PLAN:  PT FREQUENCY: 1-2x/week  PT DURATION: 12 weeks  PLANNED INTERVENTIONS: 97164- PT Re-evaluation, 97750- Physical Performance Testing, 97110-Therapeutic exercises, 97530- Therapeutic activity, [id number]- Neuromuscular re-education, 97535- Self Care, [id number]- Manual therapy, [phone]- Gait training, [phone]- Orthotic Initial, [phone]- Orthotic/Prosthetic subsequent, [phone]- Canalith repositioning, Patient/Family education, Balance training, Stair training, Taping, Joint mobilization, Spinal mobilization, Scar mobilization, Vestibular training, DME instructions, Cryotherapy, and Moist heat  PLAN FOR NEXT SESSION:   add to HEP, strength, endurance,  progressive dynamic balance training  Darryle Patten PT, DPT  Darryle JONELLE Patten, PT 05/15/2024, 5:08 PM

## 2024-05-19 ENCOUNTER — Ambulatory Visit

## 2024-05-19 ENCOUNTER — Ambulatory Visit: Payer: Medicare Other

## 2024-05-19 DIAGNOSIS — M6281 Muscle weakness (generalized): Secondary | ICD-10-CM

## 2024-05-19 DIAGNOSIS — R262 Difficulty in walking, not elsewhere classified: Secondary | ICD-10-CM

## 2024-05-19 NOTE — Therapy (Signed)
 OUTPATIENT PHYSICAL THERAPY NEURO Treatment   Patient Name: Alexander Howe MRN: [id number] DOB:1945-11-23, 78 y.o., male Today's Date: 05/19/2024   PCP: Fernande Ophelia JINNY DOUGLAS, MD  REFERRING PROVIDER: Fernande Ophelia JINNY DOUGLAS, MD   END OF SESSION:  PT End of Session - 05/19/24 1403     Visit Number 7    Number of Visits 25    Date for PT Re-Evaluation 07/21/24    Progress Note Due on Visit 10    PT Start Time 1404    PT Stop Time 1445    PT Time Calculation (min) 41 min    Equipment Utilized During Treatment Gait belt    Activity Tolerance Patient tolerated treatment well    Behavior During Therapy WFL for tasks assessed/performed             Past Medical History:  Diagnosis Date   AAA (abdominal aortic aneurysm) without rupture (HCC)    Abdominal aneurysm (HCC)    3.5 cm   Actinic keratosis 08/26/2020   right lateral chest   Anxiety    Atrial fibrillation (HCC) 05/30/2023   Basal cell carcinoma 09/18/2018   left upper back/one excised, one superficial   Basal cell carcinoma 11/28/2018   left post shoulder   Basal cell carcinoma 08/26/2020   left medial pretibial, EDC 10/2020   Basal cell carcinoma 03/15/2022   right neck, EDC   Basal cell carcinoma (BCC) 08/31/2022   Posterior base of neck superficial. 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   right lateral chest, EDC 12/09/20   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(HCC)    Venous stasis    Venous stasis    Vitamin B 12 deficiency    Past Surgical History:  Procedure Laterality Date   BIOPSY  05/30/2023   Procedure: BIOPSY;  Surgeon:  Avram Lupita BRAVO, MD;  Location: [facility] ENDOSCOPY;  Service: Gastroenterology;;   COLONOSCOPY W/ POLYPECTOMY     COLONOSCOPY WITH PROPOFOL  N/A 04/05/2018   Procedure: COLONOSCOPY WITH PROPOFOL ;  Surgeon: Viktoria Lamar DASEN, MD;  Location: [facility] ENDOSCOPY;  Service: Endoscopy;  Laterality: N/A;   COLOSTOMY     COLOSTOMY REVERSAL     CYST REMOVAL LEG     ENDOSCOPIC RETROGRADE CHOLANGIOPANCREATOGRAPHY (ERCP) WITH PROPOFOL  N/A 05/30/2023   Procedure: ENDOSCOPIC RETROGRADE CHOLANGIOPANCREATOGRAPHY (ERCP) WITH PROPOFOL ;  Surgeon: Avram Lupita BRAVO, MD;  Location: [facility] ENDOSCOPY;  Service: Gastroenterology;  Laterality: N/A;   KNEE ARTHROPLASTY Right 12/27/2015   Procedure: COMPUTER ASSISTED TOTAL KNEE ARTHROPLASTY;  Surgeon: Lynwood SHAUNNA Hue, MD;  Location: [facility] ORS;  Service: Orthopedics;  Laterality: Right;   KNEE ARTHROSCOPY Left    LIPOMA EXCISION     right thigh   LITHOTRIPSY     PANCREATIC STENT PLACEMENT  05/30/2023   Procedure: PANCREATIC STENT PLACEMENT;  Surgeon: Avram Lupita BRAVO, MD;  Location: [facility] ENDOSCOPY;  Service: Gastroenterology;;   TOTAL KNEE ARTHROPLASTY Left 10/16/2012   Patient Active Problem List   Diagnosis Date Noted  Protein-calorie malnutrition, severe 06/02/2023   Calculus of GB and bile duct w acute cholecyst w/o obst 06/02/2023   Acute pancreatitis without infection or necrosis 06/01/2023   Post-ERCP acute pancreatitis 05/31/2023   Choledocholithiasis 05/31/2023   Gastritis and gastroduodenitis 05/30/2023   Atrial fibrillation (HCC) 05/30/2023   Elevated bilirubin 05/29/2023   Generalized abdominal pain 05/29/2023   Cholelithiasis with choledocholithiasis 05/29/2023   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   Elevated LFTs 05/28/2023   OSA on CPAP 05/28/2023   Ascending aortic aneurysm (HCC) 07/26/2020   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   S/P total knee arthroplasty 12/27/2015   Major depressive disorder with single episode, in full remission (HCC) 12/23/2015    Anxiety 11/16/2015   Benign essential hypertension 11/16/2015   Depression 11/16/2015   Hypersomnia with sleep apnea 11/16/2015   Osteoarthritis 11/16/2015   Pure hypercholesterolemia 11/16/2015   Abdominal aortic aneurysm without rupture (HCC) 12/23/2014   Chronic kidney disease, stage 3 unspecified (HCC) 12/17/2014    ONSET DATE: 02/05/24  REFERRING DIAG:  Diagnosis  E44.1 (ICD-10-CM) - Mild protein-calorie malnutrition    THERAPY DIAG:  Difficulty in walking, not elsewhere classified  Muscle weakness (generalized)  Rationale for Evaluation and Treatment: Rehabilitation  SUBJECTIVE:                                                                                                                                                                                             SUBJECTIVE STATEMENT: Pt reports some R hip pain, reports constant pain.. Pt reports pain is moderate currently. This pain has been going on the last few weeks. He reports the more he walks the better it gets. Doesn't think pain has to do with exercises. Has noticed improved swelling after PT.   Pt accompanied by: self  PERTINENT HISTORY:   Pt known to clinic (see previous hx below), now returns s/p hepatectomy resection of liver, right lobectomy 02/05/2024.  Prior to surgery with previous bout of PT: The pt is a pleasant 78 yo male presenting to PT with concerns of weakness with recent findings of liver metastasis with unknown primary site. Pt reports gallbladder attack July 14th 2024, went to ED, reports they found a stone blockage. Pt then transferred to [facility] where ERCP was performed to remove stones of common bile duct. This was complicated by pancreatitis, heart failure and new onset of a-fib. He reports MRI taken at time where they found cancer in his liver. Pt has undergone 8 sessions of chemo, he also has a cholecystostomy tube that rests on his R side and down RLE. Pt reports future plans for procedure to  cut off blood supply to R side of his liver and to remove gallbladder. Pt also with recent R portal vein, R hepatic vein embolization 12/04/23... 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  PMH significant for CA, mets to liver, HF, hx of 2 knee replacements (years ago), a-fib, AAA, basal cell carcinoma, chronic kidney disease, diverticulitis, HTN, melanoma, OA, sleep apnea, vitamin b12 deficiency, chemo 07/2023, 10/2023 cholecystostomy tube placement, 12/04/2023 R portal vein, R hepatic vein emobolization, please refer to chart for full details.   PAIN:  Are you having pain? Reports occ abdominal pain   PRECAUTIONS:  Pt previously had 10# lifting restrictions, but reports those have since been removed, but to progress activity gradually. He says he has not currently worked with more than 3# weights with previous HH PT. Pt also did just recently have cholecystostomy tube removed as of last Monday.  Hx of ascending thoracic aneurysm under surveillance    RED FLAGS: Pt with hx of abdominal aneurysm but it is unchanged  WEIGHT BEARING RESTRICTIONS: No  FALLS: Has patient fallen in last 6 months?  LIVING ENVIRONMENT: via chart (from previous outpatient PT course) Lives with: lives with their spouse and has a dog Lives in: House/apartment Stairs: 2 external steps with handrail on L going up Has following equipment at home: shower chair  PLOF: Reports did need some assistance with ADLs until drain recently removed  PATIENT GOALS:  Pt wants to get stronger, get rid of some excess fluid.  OBJECTIVE:  Note: Objective measures were completed at Evaluation unless otherwise noted.  DIAGNOSTIC FINDINGS: via chart   CT abdomen pelvis 04/04/2024 Findings:   - Lower Thorax: No suspicious pulmonary abnormalities. Similar right  pleural thickening with trace effusion.   - Liver: Similar findings of prior right hepatic lobe resection.  Percutaneous  drainage catheter is in place with continued reduction in size  of postoperative fluid collection, now measuring approximately 5.7 x 3.3  cm, previously 7.7 x 4.8 cm. Tiny cysts elsewhere in the liver without  findings of recurrence. No suspicious hepatic masses are identified.  The  portal and hepatic veins are patent.   - Biliary and Gallbladder: No intrahepatic or extrahepatic bile duct  dilation. The gallbladder surgically absent.   - Spleen: Normal in appearance.    - Pancreas: Numerous cystic lesions scattered throughout the spleen,  stable, measuring up to 2.4 cm in the tail. No solid lesions or pancreatic  ductal dilation.   - Adrenal Glands: Normal in appearance.   - Kidneys: Similar benign renal cysts bilaterally measuring up to 9.2 cm in  the lower pole of the right kidney. No suspicious renal lesions. No  hydronephrosis.   - Abdominal and Pelvic Vasculature: No abdominal aortic aneurysm. Moderate  atherosclerotic plaque.   - Gastrointestinal Tract: No abnormal dilation or wall thickening.   - Peritoneum/Mesentery/Retroperitoneum: No free fluid.  No free  intraperitoneal air.   - Lymph Nodes: No retroperitoneal, mesenteric, pelvic, or inguinal  lymphadenopathy.    - Bladder: Similar intraluminal bladder calculi with chronic bladder wall  thickening.   - Pelvic Organs: Unremarkable prostate.   - Body Wall: Continued interval decrease in size of right chest/abdominal  wall hematoma, measuring 9.1 x 2.0 cm, previously 13.2 x 3.6 cm.   - Musculoskeletal:  No aggressive appearing osseous lesions. There are  moderate degenerative changes of the lumbosacral spine.   Impression:  1. Continued interval reduction in size of right upper quadrant fluid  collection and chest/abdominal wall hematoma.  2. No evidence of metastatic disease in abdomen or pelvis.   Electronically Signed by:  Toya Irving, MD, [facility]  Electronically Signed on:  04/04/2024 12:26 PM    COGNITION: Overall cognitive status: Within functional limits for tasks assessed   SENSATION: Reports no n/t anywhere   COORDINATION:  WFL rapid alt movement BUE/BLE Heel>shin WNL bilat LE  EDEMA:  Pt with bilat ankle swelling (reports since surgery), planning to get compression socks     POSTURE: Slight hunched/fwd posture and rounded shoulders    LOWER EXTREMITY MMT:    MMT Right Eval Left Eval  Hip flexion 4 4  Hip extension    Hip abduction 4+ 4+  Hip adduction 4 4+  Hip internal rotation    Hip external rotation    Knee flexion 5 5  Knee extension 5 5  Ankle dorsiflexion 4+ 4+  Ankle plantarflexion 5 5  Ankle inversion    Ankle eversion    (Blank rows = not tested)  STAIRS: Not assessed on this date GAIT: No AD, pt with decreased R heel strike, decreased bilateral hip extension, and flexed/hunched posture.   FUNCTIONAL TESTS:  5 times sit to stand: 12.5 seconds with use of UE  : 1.23 m/s  :  1066 ft without an AD FGA: assessed on 04/30/24:       PATIENT SURVEYS:  ABC scale  today 91.9%                                                                                                                              TREATMENT DATE: 05/19/24  TA: Warm-up: Ankle rockers 20x bilat LE  LAQ 15x each LE   Gait for endurance x 444 ft cuing for upright posture   Postural stretch in standing against wall 2x60 sec for improved ability to maintain upright posture in standing and with gait - rates moderate difficulty   Added to HEP & reviewed with pt Access Code: [email or web address] URL: [email or web address] Date: 05/19/2024 Prepared by: Darryle Patten  Exercises - Standing Anatomical Position with Scapular Retraction and Depression at Wall  - 1 x daily - 7 x weekly - 2 sets - 1 reps - 60 seconds hold  Octane [employer]itness lvl 1 x 6 min  for LE mm and cardioresp endurance- RPM ranges 30s-60s; Avg RPM 46   Gait training: Step-length exercise  over 10 meters: Trial 1: 23 steps Trial 2: 16 steps Trial 3: 15 steps Trial 4: 14 steps  Trial 5: 14 steps Trial 6: 13 steps  Trial 7: 13 steps Trial 8: 13 steps   Gait for technique: big steps, upright posture and arm swing holding 2# dumbbells x 444 ft. Significant improvement in technique with cuing and use of weights.    Pt reports improved hip pain following above interventions  PATIENT EDUCATION: Education details:exercise technique Pt educated throughout session about proper posture and technique with exercises. Improved exercise  technique, movement at target joints, use of target muscles after min to mod verbal, visual, tactile cues. HEP update  Person educated: Patient Education method: Explanation, Demonstration, and Verbal cues, printout Education comprehension: verbalized understanding and returned demonstration  HOME EXERCISE PROGRAM: Access Code: [email or web address] URL: [email or web address] Date: 04/30/2024 Prepared by: Darryle Patten  Exercises - Mini Squat with Counter Support  - 1 x daily - 3 x weekly - 2 sets - 10 reps - Lunge with Counter Support  - 1 x daily - 3 x weekly - 2 sets - 10 reps   Access Code: [email or web address] URL: [email or web address] Date: 04/28/2024 Prepared by: Darryle Patten  Exercises - Standing March with Counter Support  - 1-2 x daily - 3-5 x weekly - 2 sets - 10 reps - Standing Hip Abduction with Counter Support  - 1-2 x daily - 3-5 x weekly - 2 sets - 10 reps  GOALS: Goals reviewed with patient? Yes   SHORT TERM GOALS: Target date: 06/09/2024    Patient will be independent in home exercise program to improve strength/mobility for better functional independence with ADLs. Baseline: initiated  Goal status: INITIAL   LONG TERM GOALS: Target date: 07/21/2024   Patient will increase ABC scale score >95% to demonstrate better functional mobility and better confidence with ADLs and return to confidence levels pt achieved  with previous bout of therapy Baseline:  today 91.9% (prior to previous PT d/c reached 96%) Goal status: INITIAL  2.  Patient will complete five times sit to stand test in < 10 seconds indicating an increased LE strength and mm power. Baseline: 12.5 seconds with use of UE (previous PT course pt achieved 10.6 sec) Goal status: INITIAL  3. Patient will increase Functional Gait Assessment score to >27/30 as to reduce fall risk and improve dynamic gait safety with community ambulation  Baseline: 21/30- performed on 04/30/24 Goal status: INITIAL  4.  Patient will increase 10 meter walk test to >1.2 m/s as to improve gait speed for better community ambulation and return to or exceed level of function reached with previous bout of PT. Baseline: today at eval 1.23 m/s (previous PT reached 2.1 m/s on 3/19) Goal status: INITIAL  5.  Patient will increase six minute walk test distance to >1500 ft to achieve or exceed PLOF and improve functional capacity  Baseline: 1066 ft without AD (in previous bout of PT achieved 1596 ft) Goal status: INITIAL     ASSESSMENT:  CLINICAL IMPRESSION: Increased focus on improving gait mechanics & upright posture this visit. HEP updated to add standing stretch against wall. PT also had pt trial using light 2# dumbbells with gait following standing stretch with observed carryover/improvement in gait technique and posture. The pt will benefit from further skilled PT to improve the aforementioned deficits in order increase strength, gait speed/functional capacity, balance and balance confidence, and return to PLOF.   OBJECTIVE IMPAIRMENTS: Abnormal gait, decreased activity tolerance, decreased balance, decreased endurance, difficulty walking, decreased strength, increased edema, improper body mechanics, postural dysfunction, and pain.   ACTIVITY LIMITATIONS: carrying, lifting, squatting, stairs, transfers, and locomotion level  PARTICIPATION LIMITATIONS: cleaning,  shopping, community activity, and yard work  PERSONAL FACTORS: Age, Time since onset of injury/illness/exacerbation, and 3+ comorbidities: PMH significant for CA, mets to liver, HF, hx of 2 knee replacements (years ago), a-fib, AAA, basal cell carcinoma, chronic kidney disease, diverticulitis, HTN, melanoma, OA, sleep apnea, vitamin b12 deficiency, chemo 07/2023, 10/2023 cholecystostomy tube placement, 12/04/2023 R portal vein, R hepatic vein emobolization, please  refer to chart for full details. are also affecting patient's functional outcome.   REHAB POTENTIAL: Good  CLINICAL DECISION MAKING: Evolving/moderate complexity  EVALUATION COMPLEXITY: Moderate  PLAN:  PT FREQUENCY: 1-2x/week  PT DURATION: 12 weeks  PLANNED INTERVENTIONS: 97164- PT Re-evaluation, 97750- Physical Performance Testing, 97110-Therapeutic exercises, 97530- Therapeutic activity, [id number]- Neuromuscular re-education, 97535- Self Care, [id number]- Manual therapy, [phone]- Gait training, [phone]- Orthotic Initial, [phone]- Orthotic/Prosthetic subsequent, [phone]- Canalith repositioning, Patient/Family education, Balance training, Stair training, Taping, Joint mobilization, Spinal mobilization, Scar mobilization, Vestibular training, DME instructions, Cryotherapy, and Moist heat  PLAN FOR NEXT SESSION:   add to HEP as appropriate, strength, endurance,  progressive dynamic balance training  Darryle Patten PT, DPT  Darryle JONELLE Patten, PT 05/19/2024, 5:18 PM

## 2024-05-21 ENCOUNTER — Ambulatory Visit

## 2024-05-21 ENCOUNTER — Ambulatory Visit: Payer: Medicare Other

## 2024-05-26 ENCOUNTER — Ambulatory Visit: Payer: Medicare Other

## 2024-05-26 ENCOUNTER — Ambulatory Visit

## 2024-05-26 DIAGNOSIS — R2681 Unsteadiness on feet: Secondary | ICD-10-CM

## 2024-05-26 DIAGNOSIS — R262 Difficulty in walking, not elsewhere classified: Secondary | ICD-10-CM

## 2024-05-26 DIAGNOSIS — M6281 Muscle weakness (generalized): Secondary | ICD-10-CM

## 2024-05-26 NOTE — Therapy (Unsigned)
 OUTPATIENT PHYSICAL THERAPY NEURO Treatment   Patient Name: Alexander Howe MRN: [id number] DOB:10-28-1946, 78 y.o., male Today's Date: 05/27/2024   PCP: Fernande Ophelia JINNY DOUGLAS, MD  REFERRING PROVIDER: Fernande Ophelia JINNY DOUGLAS, MD   END OF SESSION:  PT End of Session - 05/26/24 1457     Visit Number 8    Number of Visits 25    Date for PT Re-Evaluation 07/21/24    Progress Note Due on Visit 10    PT Start Time 1445    PT Stop Time 1530    PT Time Calculation (min) 45 min    Equipment Utilized During Treatment Gait belt    Activity Tolerance Patient tolerated treatment well    Behavior During Therapy WFL for tasks assessed/performed             Past Medical History:  Diagnosis Date   AAA (abdominal aortic aneurysm) without rupture (HCC)    Abdominal aneurysm (HCC)    3.5 cm   Actinic keratosis 08/26/2020   right lateral chest   Anxiety    Atrial fibrillation (HCC) 05/30/2023   Basal cell carcinoma 09/18/2018   left upper back/one excised, one superficial   Basal cell carcinoma 11/28/2018   left post shoulder   Basal cell carcinoma 08/26/2020   left medial pretibial, EDC 10/2020   Basal cell carcinoma 03/15/2022   right neck, EDC   Basal cell carcinoma (BCC) 08/31/2022   Posterior base of neck superficial. 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   right lateral chest, EDC 12/09/20   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(HCC)    Venous stasis    Venous stasis    Vitamin B 12 deficiency    Past Surgical History:  Procedure Laterality Date   BIOPSY  05/30/2023   Procedure: BIOPSY;  Surgeon:  Avram Lupita BRAVO, MD;  Location: [facility] ENDOSCOPY;  Service: Gastroenterology;;   COLONOSCOPY W/ POLYPECTOMY     COLONOSCOPY WITH PROPOFOL  N/A 04/05/2018   Procedure: COLONOSCOPY WITH PROPOFOL ;  Surgeon: Viktoria Lamar DASEN, MD;  Location: [facility] ENDOSCOPY;  Service: Endoscopy;  Laterality: N/A;   COLOSTOMY     COLOSTOMY REVERSAL     CYST REMOVAL LEG     ENDOSCOPIC RETROGRADE CHOLANGIOPANCREATOGRAPHY (ERCP) WITH PROPOFOL  N/A 05/30/2023   Procedure: ENDOSCOPIC RETROGRADE CHOLANGIOPANCREATOGRAPHY (ERCP) WITH PROPOFOL ;  Surgeon: Avram Lupita BRAVO, MD;  Location: [facility] ENDOSCOPY;  Service: Gastroenterology;  Laterality: N/A;   KNEE ARTHROPLASTY Right 12/27/2015   Procedure: COMPUTER ASSISTED TOTAL KNEE ARTHROPLASTY;  Surgeon: Lynwood SHAUNNA Hue, MD;  Location: [facility] ORS;  Service: Orthopedics;  Laterality: Right;   KNEE ARTHROSCOPY Left    LIPOMA EXCISION     right thigh   LITHOTRIPSY     PANCREATIC STENT PLACEMENT  05/30/2023   Procedure: PANCREATIC STENT PLACEMENT;  Surgeon: Avram Lupita BRAVO, MD;  Location: [facility] ENDOSCOPY;  Service: Gastroenterology;;   TOTAL KNEE ARTHROPLASTY Left 10/16/2012   Patient Active Problem List   Diagnosis Date Noted  Protein-calorie malnutrition, severe 06/02/2023   Calculus of GB and bile duct w acute cholecyst w/o obst 06/02/2023   Acute pancreatitis without infection or necrosis 06/01/2023   Post-ERCP acute pancreatitis 05/31/2023   Choledocholithiasis 05/31/2023   Gastritis and gastroduodenitis 05/30/2023   Atrial fibrillation (HCC) 05/30/2023   Elevated bilirubin 05/29/2023   Generalized abdominal pain 05/29/2023   Cholelithiasis with choledocholithiasis 05/29/2023   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   Elevated LFTs 05/28/2023   OSA on CPAP 05/28/2023   Ascending aortic aneurysm (HCC) 07/26/2020   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   S/P total knee arthroplasty 12/27/2015   Major depressive disorder with single episode, in full remission (HCC) 12/23/2015    Anxiety 11/16/2015   Benign essential hypertension 11/16/2015   Depression 11/16/2015   Hypersomnia with sleep apnea 11/16/2015   Osteoarthritis 11/16/2015   Pure hypercholesterolemia 11/16/2015   Abdominal aortic aneurysm without rupture (HCC) 12/23/2014   Chronic kidney disease, stage 3 unspecified (HCC) 12/17/2014    ONSET DATE: 02/05/24  REFERRING DIAG:  Diagnosis  E44.1 (ICD-10-CM) - Mild protein-calorie malnutrition    THERAPY DIAG:  Difficulty in walking, not elsewhere classified  Muscle weakness (generalized)  Unsteadiness on feet  Rationale for Evaluation and Treatment: Rehabilitation  SUBJECTIVE:                                                                                                                                                                                             SUBJECTIVE STATEMENT: Pt reports no new concerns or complaints. Denies falls. Still having some R hip/lower back pain.  Pt accompanied by: self  PERTINENT HISTORY:   Pt known to clinic (see previous hx below), now returns s/p hepatectomy resection of liver, right lobectomy 02/05/2024.  Prior to surgery with previous bout of PT: The pt is a pleasant 78 yo male presenting to PT with concerns of weakness with recent findings of liver metastasis with unknown primary site. Pt reports gallbladder attack July 14th 2024, went to ED, reports they found a stone blockage. Pt then transferred to [facility] where ERCP was performed to remove stones of common bile duct. This was complicated by pancreatitis, heart failure and new onset of a-fib. He reports MRI taken at time where they found cancer in his liver. Pt has undergone 8 sessions of chemo, he also has a cholecystostomy tube that rests on his R side and down RLE. Pt reports future plans for procedure to cut off blood supply to R side of his liver and to remove gallbladder. Pt also with recent R portal vein, R hepatic vein embolization 12/04/23... 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  PMH significant for CA, mets to liver, HF, hx of 2 knee replacements (years ago), a-fib, AAA, basal cell carcinoma, chronic kidney disease, diverticulitis, HTN, melanoma, OA, sleep apnea, vitamin b12 deficiency, chemo 07/2023, 10/2023 cholecystostomy tube placement, 12/04/2023 R portal vein, R hepatic vein emobolization, please refer to chart for full details.   PAIN:  Are you having pain? Reports occ abdominal pain   PRECAUTIONS:  Pt previously had 10# lifting restrictions, but reports those have since been removed, but to progress activity gradually. He says he has not currently worked with more than 3# weights with previous HH PT. Pt also did just recently have cholecystostomy tube removed as of last Monday.  Hx of ascending thoracic aneurysm under surveillance    RED FLAGS: Pt with hx of abdominal aneurysm but it is unchanged  WEIGHT BEARING RESTRICTIONS: No  FALLS: Has patient fallen in last 6 months?  LIVING ENVIRONMENT: via chart (from previous outpatient PT course) Lives with: lives with their spouse and has a dog Lives in: House/apartment Stairs: 2 external steps with handrail on L going up Has following equipment at home: shower chair  PLOF: Reports did need some assistance with ADLs until drain recently removed  PATIENT GOALS:  Pt wants to get stronger, get rid of some excess fluid.  OBJECTIVE:  Note: Objective measures were completed at Evaluation unless otherwise noted.  DIAGNOSTIC FINDINGS: via chart   CT abdomen pelvis 04/04/2024 Findings:   - Lower Thorax: No suspicious pulmonary abnormalities. Similar right  pleural thickening with trace effusion.   - Liver: Similar findings of prior right hepatic lobe resection.  Percutaneous drainage catheter is in place with continued reduction in size  of postoperative fluid collection, now measuring approximately 5.7 x 3.3  cm, previously 7.7 x  4.8 cm. Tiny cysts elsewhere in the liver without  findings of recurrence. No suspicious hepatic masses are identified.  The  portal and hepatic veins are patent.   - Biliary and Gallbladder: No intrahepatic or extrahepatic bile duct  dilation. The gallbladder surgically absent.   - Spleen: Normal in appearance.    - Pancreas: Numerous cystic lesions scattered throughout the spleen,  stable, measuring up to 2.4 cm in the tail. No solid lesions or pancreatic  ductal dilation.   - Adrenal Glands: Normal in appearance.   - Kidneys: Similar benign renal cysts bilaterally measuring up to 9.2 cm in  the lower pole of the right kidney. No suspicious renal lesions. No  hydronephrosis.   - Abdominal and Pelvic Vasculature: No abdominal aortic aneurysm. Moderate  atherosclerotic plaque.   - Gastrointestinal Tract: No abnormal dilation or wall thickening.   - Peritoneum/Mesentery/Retroperitoneum: No free fluid.  No free  intraperitoneal air.   - Lymph Nodes: No retroperitoneal, mesenteric, pelvic, or inguinal  lymphadenopathy.    - Bladder: Similar intraluminal bladder calculi with chronic bladder wall  thickening.   - Pelvic Organs: Unremarkable prostate.   - Body Wall: Continued interval decrease in size of right chest/abdominal  wall hematoma, measuring 9.1 x 2.0 cm, previously 13.2 x 3.6 cm.   - Musculoskeletal:  No aggressive appearing osseous lesions. There are  moderate degenerative changes of the lumbosacral spine.   Impression:  1. Continued interval reduction in size of right upper quadrant fluid  collection and chest/abdominal wall hematoma.  2. No evidence of metastatic disease in abdomen or pelvis.   Electronically Signed by:  Toya Irving, MD, [facility]  Electronically  Signed on:  04/04/2024 12:26 PM   COGNITION: Overall cognitive status: Within functional limits for tasks assessed   SENSATION: Reports no n/t anywhere   COORDINATION:  WFL rapid alt  movement BUE/BLE Heel>shin WNL bilat LE  EDEMA:  Pt with bilat ankle swelling (reports since surgery), planning to get compression socks     POSTURE: Slight hunched/fwd posture and rounded shoulders    LOWER EXTREMITY MMT:    MMT Right Eval Left Eval  Hip flexion 4 4  Hip extension    Hip abduction 4+ 4+  Hip adduction 4 4+  Hip internal rotation    Hip external rotation    Knee flexion 5 5  Knee extension 5 5  Ankle dorsiflexion 4+ 4+  Ankle plantarflexion 5 5  Ankle inversion    Ankle eversion    (Blank rows = not tested)  STAIRS: Not assessed on this date GAIT: No AD, pt with decreased R heel strike, decreased bilateral hip extension, and flexed/hunched posture.   FUNCTIONAL TESTS:  5 times sit to stand: 12.5 seconds with use of UE  : 1.23 m/s  :  1066 ft without an AD FGA: assessed on 04/30/24:       PATIENT SURVEYS:  ABC scale  today 91.9%                                                                                                                              TREATMENT DATE: 05/27/24  There.Act:   Nustep endurance training:   Lvl 1 x 1 min Lvl 2 x 1 min  Lvl 3 x 1 min   Lvl 4 x 1 min  Lvl 2 x 1 min    3# aw each LE   Circuit 1:   STS 10x    Seated march 10x each LE    Standing hip abduction 6x each LE    Gait for endurance x 148 ft       3# aw each LE   Circuit 2:   STS x10 with 2 KG med ball    Seated march 8x each LE    Seated heel to toe raises; 20x bilat LE    Standing hip abduction 6x each LE    Gait for endurance x 300 ft        Circuit 3:   STS x8, 2 KG med ball    Seated march 10x each LE    Seated heel to toe raises 20x bilat LE    Standing hip abduction 8x each LE    Gait for endurance x 300 ft - rates medium    PATIENT EDUCATION: Education details:exercise technique Pt educated throughout session about proper posture and technique with exercises. Improved exercise technique, movement  at target joints, use of target muscles after min to mod verbal, visual, tactile cues. HEP update  Person educated: Patient Education method: Explanation, Demonstration, and Verbal cues, printout Education comprehension: verbalized understanding and returned  demonstration  HOME EXERCISE PROGRAM: Access Code: [email or web address] URL: [email or web address] Date: 04/30/2024 Prepared by: Darryle Patten  Exercises - Mini Squat with Counter Support  - 1 x daily - 3 x weekly - 2 sets - 10 reps - Lunge with Counter Support  - 1 x daily - 3 x weekly - 2 sets - 10 reps   Access Code: [email or web address] URL: [email or web address] Date: 04/28/2024 Prepared by: Darryle Patten  Exercises - Standing March with Counter Support  - 1-2 x daily - 3-5 x weekly - 2 sets - 10 reps - Standing Hip Abduction with Counter Support  - 1-2 x daily - 3-5 x weekly - 2 sets - 10 reps  GOALS: Goals reviewed with patient? Yes   SHORT TERM GOALS: Target date: 06/09/2024    Patient will be independent in home exercise program to improve strength/mobility for better functional independence with ADLs. Baseline: initiated  Goal status: INITIAL   LONG TERM GOALS: Target date: 07/21/2024   Patient will increase ABC scale score >95% to demonstrate better functional mobility and better confidence with ADLs and return to confidence levels pt achieved with previous bout of therapy Baseline:  today 91.9% (prior to previous PT d/c reached 96%) Goal status: INITIAL  2.  Patient will complete five times sit to stand test in < 10 seconds indicating an increased LE strength and mm power. Baseline: 12.5 seconds with use of UE (previous PT course pt achieved 10.6 sec) Goal status: INITIAL  3. Patient will increase Functional Gait Assessment score to >27/30 as to reduce fall risk and improve dynamic gait safety with community ambulation  Baseline: 21/30- performed on 04/30/24 Goal status: INITIAL  4.  Patient will  increase 10 meter walk test to >1.2 m/s as to improve gait speed for better community ambulation and return to or exceed level of function reached with previous bout of PT. Baseline: today at eval 1.23 m/s (previous PT reached 2.1 m/s on 3/19) Goal status: INITIAL  5.  Patient will increase six minute walk test distance to >1500 ft to achieve or exceed PLOF and improve functional capacity  Baseline: 1066 ft without AD (in previous bout of PT achieved 1596 ft) Goal status: INITIAL     ASSESSMENT:  CLINICAL IMPRESSION: Continuing PT POC working on LE strength and endurance for ADL completion. Pt able to participate with increased resistance this date with good tolerance. Pt does need periodic seated rest breaks b/t circuit style training with increased WOB by end of session. Pt does continue to display some limitations in foot clearance during gait which ma increase risk for falls but is able to correct with VC's. Pt will benefit from further skilled PT to improve the aforementioned deficits in order increase strength, gait speed/functional capacity, balance and balance confidence, and return to PLOF.   OBJECTIVE IMPAIRMENTS: Abnormal gait, decreased activity tolerance, decreased balance, decreased endurance, difficulty walking, decreased strength, increased edema, improper body mechanics, postural dysfunction, and pain.   ACTIVITY LIMITATIONS: carrying, lifting, squatting, stairs, transfers, and locomotion level  PARTICIPATION LIMITATIONS: cleaning, shopping, community activity, and yard work  PERSONAL FACTORS: Age, Time since onset of injury/illness/exacerbation, and 3+ comorbidities: PMH significant for CA, mets to liver, HF, hx of 2 knee replacements (years ago), a-fib, AAA, basal cell carcinoma, chronic kidney disease, diverticulitis, HTN, melanoma, OA, sleep apnea, vitamin b12 deficiency, chemo 07/2023, 10/2023 cholecystostomy tube placement, 12/04/2023 R portal vein, R hepatic vein  emobolization, please refer to chart for full details. are also affecting patient's functional outcome.  REHAB POTENTIAL: Good  CLINICAL DECISION MAKING: Evolving/moderate complexity  EVALUATION COMPLEXITY: Moderate  PLAN:  PT FREQUENCY: 1-2x/week  PT DURATION: 12 weeks  PLANNED INTERVENTIONS: 97164- PT Re-evaluation, 97750- Physical Performance Testing, 97110-Therapeutic exercises, 97530- Therapeutic activity, [id number]- Neuromuscular re-education, 97535- Self Care, [id number]- Manual therapy, [id number]- Gait training, [phone]- Orthotic Initial, [phone]- Orthotic/Prosthetic subsequent, [phone]- Canalith repositioning, Patient/Family education, Balance training, Stair training, Taping, Joint mobilization, Spinal mobilization, Scar mobilization, Vestibular training, DME instructions, Cryotherapy, and Moist heat  PLAN FOR NEXT SESSION:   add to HEP as appropriate, strength, endurance,  progressive dynamic balance training  Dorina HERO. Fairly IV, PT, DPT Physical Therapist- [place]  [facility] 05/27/2024, 8:17 AM

## 2024-05-28 ENCOUNTER — Ambulatory Visit

## 2024-05-28 DIAGNOSIS — R2681 Unsteadiness on feet: Secondary | ICD-10-CM

## 2024-05-28 DIAGNOSIS — M6281 Muscle weakness (generalized): Secondary | ICD-10-CM | POA: Diagnosis not present

## 2024-05-28 DIAGNOSIS — R262 Difficulty in walking, not elsewhere classified: Secondary | ICD-10-CM

## 2024-05-28 NOTE — Therapy (Signed)
 OUTPATIENT PHYSICAL THERAPY NEURO TREATMENT   Patient Name: Alexander Howe MRN: [id number] DOB:1945-12-23, 78 y.o., male Today's Date: 05/28/2024   PCP: Fernande Ophelia JINNY DOUGLAS, MD  REFERRING PROVIDER: Fernande Ophelia JINNY DOUGLAS, MD   END OF SESSION:  PT End of Session - 05/28/24 1054     Visit Number 9    Number of Visits 25    Date for PT Re-Evaluation 07/21/24    Progress Note Due on Visit 10    PT Start Time 1058    PT Stop Time 1141    PT Time Calculation (min) 43 min    Equipment Utilized During Treatment Gait belt    Activity Tolerance Patient tolerated treatment well    Behavior During Therapy WFL for tasks assessed/performed              Past Medical History:  Diagnosis Date   AAA (abdominal aortic aneurysm) without rupture (HCC)    Abdominal aneurysm (HCC)    3.5 cm   Actinic keratosis 08/26/2020   right lateral chest   Anxiety    Atrial fibrillation (HCC) 05/30/2023   Basal cell carcinoma 09/18/2018   left upper back/one excised, one superficial   Basal cell carcinoma 11/28/2018   left post shoulder   Basal cell carcinoma 08/26/2020   left medial pretibial, EDC 10/2020   Basal cell carcinoma 03/15/2022   right neck, EDC   Basal cell carcinoma (BCC) 08/31/2022   Posterior base of neck superficial. 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   right lateral chest, EDC 12/09/20   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(HCC)    Venous stasis    Venous stasis    Vitamin B 12 deficiency    Past Surgical History:  Procedure Laterality Date   BIOPSY  05/30/2023   Procedure: BIOPSY;  Surgeon:  Avram Lupita BRAVO, MD;  Location: [facility] ENDOSCOPY;  Service: Gastroenterology;;   COLONOSCOPY W/ POLYPECTOMY     COLONOSCOPY WITH PROPOFOL  N/A 04/05/2018   Procedure: COLONOSCOPY WITH PROPOFOL ;  Surgeon: Viktoria Lamar DASEN, MD;  Location: [facility] ENDOSCOPY;  Service: Endoscopy;  Laterality: N/A;   COLOSTOMY     COLOSTOMY REVERSAL     CYST REMOVAL LEG     ENDOSCOPIC RETROGRADE CHOLANGIOPANCREATOGRAPHY (ERCP) WITH PROPOFOL  N/A 05/30/2023   Procedure: ENDOSCOPIC RETROGRADE CHOLANGIOPANCREATOGRAPHY (ERCP) WITH PROPOFOL ;  Surgeon: Avram Lupita BRAVO, MD;  Location: [facility] ENDOSCOPY;  Service: Gastroenterology;  Laterality: N/A;   KNEE ARTHROPLASTY Right 12/27/2015   Procedure: COMPUTER ASSISTED TOTAL KNEE ARTHROPLASTY;  Surgeon: Lynwood SHAUNNA Hue, MD;  Location: [facility] ORS;  Service: Orthopedics;  Laterality: Right;   KNEE ARTHROSCOPY Left    LIPOMA EXCISION     right thigh   LITHOTRIPSY     PANCREATIC STENT PLACEMENT  05/30/2023   Procedure: PANCREATIC STENT PLACEMENT;  Surgeon: Avram Lupita BRAVO, MD;  Location: [facility] ENDOSCOPY;  Service: Gastroenterology;;   TOTAL KNEE ARTHROPLASTY Left 10/16/2012   Patient Active Problem List   Diagnosis Date  Noted   Protein-calorie malnutrition, severe 06/02/2023   Calculus of GB and bile duct w acute cholecyst w/o obst 06/02/2023   Acute pancreatitis without infection or necrosis 06/01/2023   Post-ERCP acute pancreatitis 05/31/2023   Choledocholithiasis 05/31/2023   Gastritis and gastroduodenitis 05/30/2023   Atrial fibrillation (HCC) 05/30/2023   Elevated bilirubin 05/29/2023   Generalized abdominal pain 05/29/2023   Cholelithiasis with choledocholithiasis 05/29/2023   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   Elevated LFTs 05/28/2023   OSA on CPAP 05/28/2023   Ascending aortic aneurysm (HCC) 07/26/2020   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   S/P total knee arthroplasty 12/27/2015   Major depressive disorder with single episode, in full remission (HCC) 12/23/2015    Anxiety 11/16/2015   Benign essential hypertension 11/16/2015   Depression 11/16/2015   Hypersomnia with sleep apnea 11/16/2015   Osteoarthritis 11/16/2015   Pure hypercholesterolemia 11/16/2015   Abdominal aortic aneurysm without rupture (HCC) 12/23/2014   Chronic kidney disease, stage 3 unspecified (HCC) 12/17/2014    ONSET DATE: 02/05/24  REFERRING DIAG:  Diagnosis  E44.1 (ICD-10-CM) - Mild protein-calorie malnutrition    THERAPY DIAG:  Difficulty in walking, not elsewhere classified  Muscle weakness (generalized)  Unsteadiness on feet  Rationale for Evaluation and Treatment: Rehabilitation  SUBJECTIVE:                                                                                                                                                                                             SUBJECTIVE STATEMENT: Pt reports he is feeling good today. Pt reports that his hip pain is still there, but it gets better when he moves more.   Pt accompanied by: self  PERTINENT HISTORY:   Pt known to clinic (see previous hx below), now returns s/p hepatectomy resection of liver, right lobectomy 02/05/2024.  Prior to surgery with previous bout of PT: The pt is a pleasant 78 yo male presenting to PT with concerns of weakness with recent findings of liver metastasis with unknown primary site. Pt reports gallbladder attack July 14th 2024, went to ED, reports they found a stone blockage. Pt then transferred to [facility] where ERCP was performed to remove stones of common bile duct. This was complicated by pancreatitis, heart failure and new onset of a-fib. He reports MRI taken at time where they found cancer in his liver. Pt has undergone 8 sessions of chemo, he also has a cholecystostomy tube that rests on his R side and down RLE. Pt reports future plans for procedure to cut off blood supply to R side of his liver and to remove gallbladder. Pt also with recent R  portal vein, R hepatic vein  embolization 12/04/23... 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  PMH significant for CA, mets to liver, HF, hx of 2 knee replacements (years ago), a-fib, AAA, basal cell carcinoma, chronic kidney disease, diverticulitis, HTN, melanoma, OA, sleep apnea, vitamin b12 deficiency, chemo 07/2023, 10/2023 cholecystostomy tube placement, 12/04/2023 R portal vein, R hepatic vein emobolization, please refer to chart for full details.   PAIN:  Are you having pain? Reports occ abdominal pain   PRECAUTIONS:  Pt previously had 10# lifting restrictions, but reports those have since been removed, but to progress activity gradually. He says he has not currently worked with more than 3# weights with previous HH PT. Pt also did just recently have cholecystostomy tube removed as of last Monday.  Hx of ascending thoracic aneurysm under surveillance    RED FLAGS: Pt with hx of abdominal aneurysm but it is unchanged  WEIGHT BEARING RESTRICTIONS: No  FALLS: Has patient fallen in last 6 months?  LIVING ENVIRONMENT: via chart (from previous outpatient PT course) Lives with: lives with their spouse and has a dog Lives in: House/apartment Stairs: 2 external steps with handrail on L going up Has following equipment at home: shower chair  PLOF: Reports did need some assistance with ADLs until drain recently removed  PATIENT GOALS:  Pt wants to get stronger, get rid of some excess fluid.  OBJECTIVE:  Note: Objective measures were completed at Evaluation unless otherwise noted.  DIAGNOSTIC FINDINGS: via chart   CT abdomen pelvis 04/04/2024 Findings:   - Lower Thorax: No suspicious pulmonary abnormalities. Similar right  pleural thickening with trace effusion.   - Liver: Similar findings of prior right hepatic lobe resection.  Percutaneous drainage catheter is in place with continued reduction in size  of postoperative fluid collection, now measuring approximately  5.7 x 3.3  cm, previously 7.7 x 4.8 cm. Tiny cysts elsewhere in the liver without  findings of recurrence. No suspicious hepatic masses are identified.  The  portal and hepatic veins are patent.   - Biliary and Gallbladder: No intrahepatic or extrahepatic bile duct  dilation. The gallbladder surgically absent.   - Spleen: Normal in appearance.    - Pancreas: Numerous cystic lesions scattered throughout the spleen,  stable, measuring up to 2.4 cm in the tail. No solid lesions or pancreatic  ductal dilation.   - Adrenal Glands: Normal in appearance.   - Kidneys: Similar benign renal cysts bilaterally measuring up to 9.2 cm in  the lower pole of the right kidney. No suspicious renal lesions. No  hydronephrosis.   - Abdominal and Pelvic Vasculature: No abdominal aortic aneurysm. Moderate  atherosclerotic plaque.   - Gastrointestinal Tract: No abnormal dilation or wall thickening.   - Peritoneum/Mesentery/Retroperitoneum: No free fluid.  No free  intraperitoneal air.   - Lymph Nodes: No retroperitoneal, mesenteric, pelvic, or inguinal  lymphadenopathy.    - Bladder: Similar intraluminal bladder calculi with chronic bladder wall  thickening.   - Pelvic Organs: Unremarkable prostate.   - Body Wall: Continued interval decrease in size of right chest/abdominal  wall hematoma, measuring 9.1 x 2.0 cm, previously 13.2 x 3.6 cm.   - Musculoskeletal:  No aggressive appearing osseous lesions. There are  moderate degenerative changes of the lumbosacral spine.   Impression:  1. Continued interval reduction in size of right upper quadrant fluid  collection and chest/abdominal wall hematoma.  2. No evidence of metastatic disease in abdomen or pelvis.  Electronically Signed by:  Toya Irving, MD, [facility]  Electronically Signed on:  04/04/2024 12:26 PM   COGNITION: Overall cognitive status: Within functional limits for tasks assessed   SENSATION: Reports no n/t anywhere    COORDINATION:  WFL rapid alt movement BUE/BLE Heel>shin WNL bilat LE  EDEMA:  Pt with bilat ankle swelling (reports since surgery), planning to get compression socks     POSTURE: Slight hunched/fwd posture and rounded shoulders    LOWER EXTREMITY MMT:    MMT Right Eval Left Eval  Hip flexion 4 4  Hip extension    Hip abduction 4+ 4+  Hip adduction 4 4+  Hip internal rotation    Hip external rotation    Knee flexion 5 5  Knee extension 5 5  Ankle dorsiflexion 4+ 4+  Ankle plantarflexion 5 5  Ankle inversion    Ankle eversion    (Blank rows = not tested)  STAIRS: Not assessed on this date GAIT: No AD, pt with decreased R heel strike, decreased bilateral hip extension, and flexed/hunched posture.   FUNCTIONAL TESTS:  5 times sit to stand: 12.5 seconds with use of UE  : 1.23 m/s  :  1066 ft without an AD FGA: assessed on 04/30/24:       PATIENT SURVEYS:  ABC scale  today 91.9%                                                                                                                              TREATMENT DATE: 05/28/24  Ther.Act:   Nustep endurance training:   Lvl 1 x 1 min Lvl 2 x 1 min  Lvl 3 x 1 min   Lvl 4 x 1 min  Lvl 2 x 1 min Lvl 1 x 1 min   Standing at support bar with 2.5# AW donned: - Calf raises x15 - Mini-squat x15 - Marches x15 - Straight leg abduction x15 - Glute kick back x15   Seated rest break prior to walking   Weighted ambulation with 2.5# AW donned 137ft x 3 laps  Repeated standing exercises above: - Calf raises x15 - Mini-squat x15 - Marches x15 - Straight leg abduction x15 - Glute kick back x15   Repeat weighted ambulation with 2.5# AW donned 166ft x3 laps  Seated swiss ball roll-outs to decrease tension in lower back x multiple reps  PATIENT EDUCATION: Education details:exercise technique Pt educated throughout session about proper posture and technique with exercises. Improved exercise  technique, movement at target joints, use of target muscles after min to mod verbal, visual, tactile cues. HEP update  Person educated: Patient Education method: Explanation, Demonstration, and Verbal cues, printout Education comprehension: verbalized understanding and returned demonstration  HOME EXERCISE PROGRAM: Access Code: [email or web address] URL: [email or web address] Date: 04/30/2024 Prepared by: Darryle Patten  Exercises - Mini Squat with Counter Support  - 1 x daily - 3 x weekly - 2 sets - 10 reps - Lunge with Counter  Support  - 1 x daily - 3 x weekly - 2 sets - 10 reps   Access Code: [email or web address] URL: [email or web address] Date: 04/28/2024 Prepared by: Darryle Patten  Exercises - Standing March with Counter Support  - 1-2 x daily - 3-5 x weekly - 2 sets - 10 reps - Standing Hip Abduction with Counter Support  - 1-2 x daily - 3-5 x weekly - 2 sets - 10 reps  GOALS: Goals reviewed with patient? Yes   SHORT TERM GOALS: Target date: 06/09/2024    Patient will be independent in home exercise program to improve strength/mobility for better functional independence with ADLs. Baseline: initiated  Goal status: INITIAL   LONG TERM GOALS: Target date: 07/21/2024   Patient will increase ABC scale score >95% to demonstrate better functional mobility and better confidence with ADLs and return to confidence levels pt achieved with previous bout of therapy Baseline:  today 91.9% (prior to previous PT d/c reached 96%) Goal status: INITIAL  2.  Patient will complete five times sit to stand test in < 10 seconds indicating an increased LE strength and mm power. Baseline: 12.5 seconds with use of UE (previous PT course pt achieved 10.6 sec) Goal status: INITIAL  3. Patient will increase Functional Gait Assessment score to >27/30 as to reduce fall risk and improve dynamic gait safety with community ambulation  Baseline: 21/30- performed on 04/30/24 Goal status:  INITIAL  4.  Patient will increase 10 meter walk test to >1.2 m/s as to improve gait speed for better community ambulation and return to or exceed level of function reached with previous bout of PT. Baseline: today at eval 1.23 m/s (previous PT reached 2.1 m/s on 3/19) Goal status: INITIAL  5.  Patient will increase six minute walk test distance to >1500 ft to achieve or exceed PLOF and improve functional capacity  Baseline: 1066 ft without AD (in previous bout of PT achieved 1596 ft) Goal status: INITIAL     ASSESSMENT:  CLINICAL IMPRESSION: Pt tolerated circuit training and weighted ambulation well today. Pt required intermittent cueing throughout ambulation to increase hip flexion to improve foot clearance throughout. Pt able to progress his ambulation distance today with weights added, showing improvements in endurance capacity. Pt remains motivated to participate in PT and denied hip pain at end of session. Pt will benefit from further skilled PT to improve the aforementioned deficits in order increase strength, gait speed/functional capacity, balance and balance confidence, and return to PLOF.   OBJECTIVE IMPAIRMENTS: Abnormal gait, decreased activity tolerance, decreased balance, decreased endurance, difficulty walking, decreased strength, increased edema, improper body mechanics, postural dysfunction, and pain.   ACTIVITY LIMITATIONS: carrying, lifting, squatting, stairs, transfers, and locomotion level  PARTICIPATION LIMITATIONS: cleaning, shopping, community activity, and yard work  PERSONAL FACTORS: Age, Time since onset of injury/illness/exacerbation, and 3+ comorbidities: PMH significant for CA, mets to liver, HF, hx of 2 knee replacements (years ago), a-fib, AAA, basal cell carcinoma, chronic kidney disease, diverticulitis, HTN, melanoma, OA, sleep apnea, vitamin b12 deficiency, chemo 07/2023, 10/2023 cholecystostomy tube placement, 12/04/2023 R portal vein, R hepatic vein  emobolization, please refer to chart for full details. are also affecting patient's functional outcome.   REHAB POTENTIAL: Good  CLINICAL DECISION MAKING: Evolving/moderate complexity  EVALUATION COMPLEXITY: Moderate  PLAN:  PT FREQUENCY: 1-2x/week  PT DURATION: 12 weeks  PLANNED INTERVENTIONS: 97164- PT Re-evaluation, 97750- Physical Performance Testing, 97110-Therapeutic exercises, 97530- Therapeutic activity, [id number]- Neuromuscular re-education, 97535- Self Care, [id number]- Manual therapy, [id number]- Gait training, [phone]- Orthotic  Initial, 02236- Orthotic/Prosthetic subsequent, [phone]- Canalith repositioning, Patient/Family education, Balance training, Stair training, Taping, Joint mobilization, Spinal mobilization, Scar mobilization, Vestibular training, DME instructions, Cryotherapy, and Moist heat  PLAN FOR NEXT SESSION:   add to HEP as appropriate, strength, endurance,  progressive dynamic balance training  Jennyfer Nickolson, SPT  This entire session was performed under direct supervision and direction of a licensed therapist/therapist assistant . I have personally read, edited and approve of the note as written.   Marina  Leopoldo, PT, DPT Physical Therapist - [facility]  Outpatient Physical Therapy- Main Campus [phone]     05/28/2024, 1:04 PM

## 2024-06-02 ENCOUNTER — Ambulatory Visit: Admitting: Physical Therapy

## 2024-06-02 DIAGNOSIS — M6281 Muscle weakness (generalized): Secondary | ICD-10-CM | POA: Diagnosis not present

## 2024-06-02 DIAGNOSIS — R262 Difficulty in walking, not elsewhere classified: Secondary | ICD-10-CM

## 2024-06-02 DIAGNOSIS — R2681 Unsteadiness on feet: Secondary | ICD-10-CM

## 2024-06-02 NOTE — Therapy (Signed)
 OUTPATIENT PHYSICAL THERAPY NEURO TREATMENT/  PHYSICAL THERAPY PROGRESS NOTE   Dates of reporting period  04/28/24   to   06/02/2024     Patient Name: Alexander Howe MRN: [id number] DOB:1945/11/30, 78 y.o., male Today's Date: 06/02/2024   PCP: Fernande Ophelia JINNY DOUGLAS, MD  REFERRING PROVIDER: Fernande Ophelia JINNY DOUGLAS, MD   END OF SESSION:  PT End of Session - 06/02/24 1406     Visit Number 10    Number of Visits 25    Date for PT Re-Evaluation 07/21/24    Progress Note Due on Visit 10    PT Start Time 1405    PT Stop Time 1445    PT Time Calculation (min) 40 min    Equipment Utilized During Treatment Gait belt    Activity Tolerance Patient tolerated treatment well    Behavior During Therapy WFL for tasks assessed/performed              Past Medical History:  Diagnosis Date   AAA (abdominal aortic aneurysm) without rupture (HCC)    Abdominal aneurysm (HCC)    3.5 cm   Actinic keratosis 08/26/2020   right lateral chest   Anxiety    Atrial fibrillation (HCC) 05/30/2023   Basal cell carcinoma 09/18/2018   left upper back/one excised, one superficial   Basal cell carcinoma 11/28/2018   left post shoulder   Basal cell carcinoma 08/26/2020   left medial pretibial, EDC 10/2020   Basal cell carcinoma 03/15/2022   right neck, EDC   Basal cell carcinoma (BCC) 08/31/2022   Posterior base of neck superficial. 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   right lateral chest, EDC 12/09/20   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(HCC)    Venous stasis    Venous stasis    Vitamin B 12 deficiency    Past  Surgical History:  Procedure Laterality Date   BIOPSY  05/30/2023   Procedure: BIOPSY;  Surgeon: Avram Lupita BRAVO, MD;  Location: [facility] ENDOSCOPY;  Service: Gastroenterology;;   COLONOSCOPY W/ POLYPECTOMY     COLONOSCOPY WITH PROPOFOL  N/A 04/05/2018   Procedure: COLONOSCOPY WITH PROPOFOL ;  Surgeon: Viktoria Lamar DASEN, MD;  Location: [facility] ENDOSCOPY;  Service: Endoscopy;  Laterality: N/A;   COLOSTOMY     COLOSTOMY REVERSAL     CYST REMOVAL LEG     ENDOSCOPIC RETROGRADE CHOLANGIOPANCREATOGRAPHY (ERCP) WITH PROPOFOL  N/A 05/30/2023   Procedure: ENDOSCOPIC RETROGRADE CHOLANGIOPANCREATOGRAPHY (ERCP) WITH PROPOFOL ;  Surgeon: Avram Lupita BRAVO, MD;  Location: [facility] ENDOSCOPY;  Service: Gastroenterology;  Laterality: N/A;   KNEE ARTHROPLASTY Right 12/27/2015   Procedure: COMPUTER ASSISTED TOTAL KNEE ARTHROPLASTY;  Surgeon: Lynwood SHAUNNA Hue, MD;  Location: [facility] ORS;  Service: Orthopedics;  Laterality: Right;   KNEE ARTHROSCOPY Left    LIPOMA EXCISION     right thigh   LITHOTRIPSY     PANCREATIC STENT PLACEMENT  05/30/2023   Procedure: PANCREATIC STENT PLACEMENT;  Surgeon: Avram Lupita BRAVO, MD;  Location: [facility]  ENDOSCOPY;  Service: Gastroenterology;;   TOTAL KNEE ARTHROPLASTY Left 10/16/2012   Patient Active Problem List   Diagnosis Date Noted   Protein-calorie malnutrition, severe 06/02/2023   Calculus of GB and bile duct w acute cholecyst w/o obst 06/02/2023   Acute pancreatitis without infection or necrosis 06/01/2023   Post-ERCP acute pancreatitis 05/31/2023   Choledocholithiasis 05/31/2023   Gastritis and gastroduodenitis 05/30/2023   Atrial fibrillation (HCC) 05/30/2023   Elevated bilirubin 05/29/2023   Generalized abdominal pain 05/29/2023   Cholelithiasis with choledocholithiasis 05/29/2023   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   Elevated LFTs 05/28/2023   OSA on CPAP 05/28/2023   Ascending aortic aneurysm (HCC) 07/26/2020   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   S/P total knee arthroplasty  12/27/2015   Major depressive disorder with single episode, in full remission (HCC) 12/23/2015   Anxiety 11/16/2015   Benign essential hypertension 11/16/2015   Depression 11/16/2015   Hypersomnia with sleep apnea 11/16/2015   Osteoarthritis 11/16/2015   Pure hypercholesterolemia 11/16/2015   Abdominal aortic aneurysm without rupture (HCC) 12/23/2014   Chronic kidney disease, stage 3 unspecified (HCC) 12/17/2014    ONSET DATE: 02/05/24  REFERRING DIAG:  Diagnosis  E44.1 (ICD-10-CM) - Mild protein-calorie malnutrition    THERAPY DIAG:  Difficulty in walking, not elsewhere classified  Muscle weakness (generalized)  Unsteadiness on feet  Rationale for Evaluation and Treatment: Rehabilitation  SUBJECTIVE:                                                                                                                                                                                             SUBJECTIVE STATEMENT: Pt report that he is in quite a bit of discomfort this afternoon. States that he was lifting dog food bags over the weekend and is back is feels extremely stiff. NPS 7/10.   Pt accompanied by: self  PERTINENT HISTORY:   Pt known to clinic (see previous hx below), now returns s/p hepatectomy resection of liver, right lobectomy 02/05/2024.  Prior to surgery with previous bout of PT: The pt is a pleasant 78 yo male presenting to PT with concerns of weakness with recent findings of liver metastasis with unknown primary site. Pt reports gallbladder attack July 14th 2024, went to ED, reports they found a stone blockage. Pt then transferred to [facility] where ERCP was performed to remove stones of common bile duct. This was complicated by pancreatitis, heart failure and new onset of a-fib. He reports MRI taken at time where they found cancer in his liver. Pt has undergone 8 sessions of chemo, he also has a cholecystostomy tube that rests on his R  side and down RLE. Pt reports future  plans for procedure to cut off blood supply to R side of his liver and to remove gallbladder. Pt also with recent R portal vein, R hepatic vein embolization 12/04/23... 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  PMH significant for CA, mets to liver, HF, hx of 2 knee replacements (years ago), a-fib, AAA, basal cell carcinoma, chronic kidney disease, diverticulitis, HTN, melanoma, OA, sleep apnea, vitamin b12 deficiency, chemo 07/2023, 10/2023 cholecystostomy tube placement, 12/04/2023 R portal vein, R hepatic vein emobolization, please refer to chart for full details.   PAIN:  Are you having pain? Reports occ abdominal pain   PRECAUTIONS:  Pt previously had 10# lifting restrictions, but reports those have since been removed, but to progress activity gradually. He says he has not currently worked with more than 3# weights with previous HH PT. Pt also did just recently have cholecystostomy tube removed as of last Monday.  Hx of ascending thoracic aneurysm under surveillance    RED FLAGS: Pt with hx of abdominal aneurysm but it is unchanged  WEIGHT BEARING RESTRICTIONS: No  FALLS: Has patient fallen in last 6 months?  LIVING ENVIRONMENT: via chart (from previous outpatient PT course) Lives with: lives with their spouse and has a dog Lives in: House/apartment Stairs: 2 external steps with handrail on L going up Has following equipment at home: shower chair  PLOF: Reports did need some assistance with ADLs until drain recently removed  PATIENT GOALS:  Pt wants to get stronger, get rid of some excess fluid.  OBJECTIVE:  Note: Objective measures were completed at Evaluation unless otherwise noted.  DIAGNOSTIC FINDINGS: via chart   CT abdomen pelvis 04/04/2024 Findings:   - Lower Thorax: No suspicious pulmonary abnormalities. Similar right  pleural thickening with trace effusion.   - Liver: Similar findings of prior right hepatic lobe resection.   Percutaneous drainage catheter is in place with continued reduction in size  of postoperative fluid collection, now measuring approximately 5.7 x 3.3  cm, previously 7.7 x 4.8 cm. Tiny cysts elsewhere in the liver without  findings of recurrence. No suspicious hepatic masses are identified.  The  portal and hepatic veins are patent.   - Biliary and Gallbladder: No intrahepatic or extrahepatic bile duct  dilation. The gallbladder surgically absent.   - Spleen: Normal in appearance.    - Pancreas: Numerous cystic lesions scattered throughout the spleen,  stable, measuring up to 2.4 cm in the tail. No solid lesions or pancreatic  ductal dilation.   - Adrenal Glands: Normal in appearance.   - Kidneys: Similar benign renal cysts bilaterally measuring up to 9.2 cm in  the lower pole of the right kidney. No suspicious renal lesions. No  hydronephrosis.   - Abdominal and Pelvic Vasculature: No abdominal aortic aneurysm. Moderate  atherosclerotic plaque.   - Gastrointestinal Tract: No abnormal dilation or wall thickening.   - Peritoneum/Mesentery/Retroperitoneum: No free fluid.  No free  intraperitoneal air.   - Lymph Nodes: No retroperitoneal, mesenteric, pelvic, or inguinal  lymphadenopathy.    - Bladder: Similar intraluminal bladder calculi with chronic bladder wall  thickening.   - Pelvic Organs: Unremarkable prostate.   - Body Wall: Continued interval decrease in size of right chest/abdominal  wall hematoma, measuring 9.1 x 2.0 cm, previously 13.2 x 3.6 cm.   - Musculoskeletal:  No aggressive appearing osseous lesions. There are  moderate degenerative changes of the lumbosacral spine.   Impression:  1. Continued interval reduction in size of right upper quadrant fluid  collection and chest/abdominal wall hematoma.  2. No evidence of metastatic disease in abdomen or pelvis.   Electronically Signed by:  Toya Irving, MD, [facility]  Electronically Signed on:   04/04/2024 12:26 PM   COGNITION: Overall cognitive status: Within functional limits for tasks assessed   SENSATION: Reports no n/t anywhere   COORDINATION:  WFL rapid alt movement BUE/BLE Heel>shin WNL bilat LE  EDEMA:  Pt with bilat ankle swelling (reports since surgery), planning to get compression socks     POSTURE: Slight hunched/fwd posture and rounded shoulders    LOWER EXTREMITY MMT:    MMT Right Eval Left Eval  Hip flexion 4 4  Hip extension    Hip abduction 4+ 4+  Hip adduction 4 4+  Hip internal rotation    Hip external rotation    Knee flexion 5 5  Knee extension 5 5  Ankle dorsiflexion 4+ 4+  Ankle plantarflexion 5 5  Ankle inversion    Ankle eversion    (Blank rows = not tested)  STAIRS: Not assessed on this date GAIT: No AD, pt with decreased R heel strike, decreased bilateral hip extension, and flexed/hunched posture.   FUNCTIONAL TESTS:  5 times sit to stand: 12.5 seconds with use of UE  : 1.23 m/s  :  1066 ft without an AD FGA: assessed on 04/30/24:       PATIENT SURVEYS:  ABC scale  today 91.9%                                                                                                                              TREATMENT DATE: 06/02/24  TE LTR x 2 with 3 sec hold .  Single knee to chest 2 x 20 sec hold bil .  Longitudinal distraction x 20 sec bil   Pt performed 5 time sit<>stand (5xSTS):  13.02, 10.31 sec (>15 sec indicates increased fall risk)   10 Meter Walk Test: Patient instructed to walk 10 meters (32.8 ft) as quickly and as safely as possible at their normal speed x2 and at a fast speed x2. Time measured from 2 meter mark to 8 meter mark to accommodate ramp-up and ramp-down.   Average Normal speed: 0.9 m/s  Average Fast speed: 1.76m/s Cut off scores: <0.4 m/s = household Ambulator, 0.4-0.8 m/s = limited community Ambulator, >0.8 m/s = community Ambulator, >1.2 m/s = crossing a street, <1.0 = increased  fall risk MCID 0.05 m/s (small), 0.13 m/s (moderate), 0.06 m/s (significant)  (ANPTA Core Set of Outcome Measures for Adults with Neurologic Conditions, 2018)  6 Min Walk Test:  Instructed patient to ambulate as quickly and as safely as possible for 6 minutes using LRAD. Patient was allowed to take standing rest breaks without stopping the test, but if the patient required a sitting rest break the clock would be stopped and the test would be over.  Results: 1187ft no AD  feet no assist from PT. Results indicate that the patient has reduced endurance with ambulation compared to age matched norms. Mild back stiffness increase with increased distance on this day.  Age Matched Norms: 58-69 yo M: 56 F: 47, 54-79 yo M: 66 F: 471, 48-89 yo M: 417 F: 392 MDC: 58.21 meters (190.98 feet) or 50 meters (ANPTA Core Set of Outcome Measures for Adults with Neurologic Conditions, 2018)    PATIENT EDUCATION: Education details:exercise technique Pt educated throughout session about proper posture and technique with exercises. Improved exercise technique, movement at target joints, use of target muscles after min to mod verbal, visual, tactile cues. HEP update  Person educated: Patient Education method: Explanation, Demonstration, and Verbal cues, printout Education comprehension: verbalized understanding and returned demonstration  HOME EXERCISE PROGRAM: Access Code: [email or web address] URL: [email or web address] Date: 04/30/2024 Prepared by: Darryle Patten  Exercises - Mini Squat with Counter Support  - 1 x daily - 3 x weekly - 2 sets - 10 reps - Lunge with Counter Support  - 1 x daily - 3 x weekly - 2 sets - 10 reps   Access Code: [email or web address] URL: [email or web address] Date: 04/28/2024 Prepared by: Darryle Patten  Exercises - Standing March with Counter Support  - 1-2 x daily - 3-5 x weekly - 2 sets - 10 reps - Standing Hip Abduction with Counter Support  - 1-2 x daily - 3-5 x  weekly - 2 sets - 10 reps  GOALS: Goals reviewed with patient? Yes   SHORT TERM GOALS: Target date: 06/09/2024    Patient will be independent in home exercise program to improve strength/mobility for better functional independence with ADLs. Baseline: initiated  Goal status: MET   LONG TERM GOALS: Target date: 07/21/2024   Patient will increase ABC scale score >95% to demonstrate better functional mobility and better confidence with ADLs and return to confidence levels pt achieved with previous bout of therapy Baseline:  today 91.9% (prior to previous PT d/c reached 96%) 7/21: 96%  Goal status: MET  2.  Patient will complete five times sit to stand test in < 10 seconds indicating an increased LE strength and mm power. Baseline: 12.5 seconds with use of UE (previous PT course pt achieved 10.6 sec) 7/21: 11.66sec no Use of BUE Goal status: INITIAL  3. Patient will increase Functional Gait Assessment score to >27/30 as to reduce fall risk and improve dynamic gait safety with community ambulation  Baseline: 21/30- performed on 04/30/24 Goal status: INITIAL  4.  Patient will increase 10 meter walk test to >1.2 m/s as to improve gait speed for better community ambulation and return to or exceed level of function reached with previous bout of PT. Baseline: today at eval 1.23 m/s (previous PT reached 2.1 m/s on 3/19) 7/21: normal walking speed: 0.16m/s. fast walking speed. 1.59m/s Goal status: MET  5.  Patient will increase six minute walk test distance to >1500 ft to achieve or exceed PLOF and improve functional capacity  Baseline: 1066 ft without AD (in previous bout of PT achieved 1596 ft) 7/21: 111ft no AD with increased back stiffness on this day Goal status: INITIAL     ASSESSMENT:  CLINICAL IMPRESSION: Pt instructed in goal assessment for progress note. Pt has me 2 of 5 LTGPt demonstrates improved in safety and mobility with decreased time on 5x STS, improved gait speed,  improved confidence with balance on ABCs as well as increased distance on 6 min walk test. Patient's condition has  the potential to improve in response to therapy. Maximum improvement is yet to be obtained. The anticipated improvement is attainable and reasonable in a generally predictable time.  Pt will benefit from further skilled PT to improve the aforementioned deficits in order increase strength, gait speed/functional capacity, balance and balance confidence, and return to PLOF.   OBJECTIVE IMPAIRMENTS: Abnormal gait, decreased activity tolerance, decreased balance, decreased endurance, difficulty walking, decreased strength, increased edema, improper body mechanics, postural dysfunction, and pain.   ACTIVITY LIMITATIONS: carrying, lifting, squatting, stairs, transfers, and locomotion level  PARTICIPATION LIMITATIONS: cleaning, shopping, community activity, and yard work  PERSONAL FACTORS: Age, Time since onset of injury/illness/exacerbation, and 3+ comorbidities: PMH significant for CA, mets to liver, HF, hx of 2 knee replacements (years ago), a-fib, AAA, basal cell carcinoma, chronic kidney disease, diverticulitis, HTN, melanoma, OA, sleep apnea, vitamin b12 deficiency, chemo 07/2023, 10/2023 cholecystostomy tube placement, 12/04/2023 R portal vein, R hepatic vein emobolization, please refer to chart for full details. are also affecting patient's functional outcome.   REHAB POTENTIAL: Good  CLINICAL DECISION MAKING: Evolving/moderate complexity  EVALUATION COMPLEXITY: Moderate  PLAN:  PT FREQUENCY: 1-2x/week  PT DURATION: 12 weeks  PLANNED INTERVENTIONS: 97164- PT Re-evaluation, 97750- Physical Performance Testing, 97110-Therapeutic exercises, 97530- Therapeutic activity, 97112- Neuromuscular re-education, 97535- Self Care, [id number]- Manual therapy, [phone]- Gait training, [phone]- Orthotic Initial, [phone]- Orthotic/Prosthetic subsequent, [phone]- Canalith repositioning, Patient/Family education,  Balance training, Stair training, Taping, Joint mobilization, Spinal mobilization, Scar mobilization, Vestibular training, DME instructions, Cryotherapy, and Moist heat  PLAN FOR NEXT SESSION:   add to HEP as appropriate, strength, endurance,  progressive dynamic balance training  Massie Dollar PT, DPT  Physical Therapist - [facility]  2:07 PM 06/02/24    06/02/2024, 2:07 PM

## 2024-06-04 ENCOUNTER — Ambulatory Visit

## 2024-06-04 DIAGNOSIS — M6281 Muscle weakness (generalized): Secondary | ICD-10-CM

## 2024-06-04 DIAGNOSIS — R262 Difficulty in walking, not elsewhere classified: Secondary | ICD-10-CM

## 2024-06-04 DIAGNOSIS — R2681 Unsteadiness on feet: Secondary | ICD-10-CM

## 2024-06-04 NOTE — Therapy (Signed)
 OUTPATIENT PHYSICAL THERAPY NEURO TREATMENT   Patient Name: MIKI BLANK MRN: [id number] DOB:23-Jan-1946, 78 y.o., male Today's Date: 06/04/2024  PCP: Fernande Ophelia JINNY DOUGLAS, MD  REFERRING PROVIDER: Fernande Ophelia JINNY DOUGLAS, MD   END OF SESSION:  PT End of Session - 06/04/24 1053     Visit Number 11    Number of Visits 25    Date for PT Re-Evaluation 07/21/24    Progress Note Due on Visit 10    PT Start Time 1100    PT Stop Time 1141    PT Time Calculation (min) 41 min    Equipment Utilized During Treatment Gait belt    Activity Tolerance Patient tolerated treatment well    Behavior During Therapy WFL for tasks assessed/performed               Past Medical History:  Diagnosis Date   AAA (abdominal aortic aneurysm) without rupture (HCC)    Abdominal aneurysm (HCC)    3.5 cm   Actinic keratosis 08/26/2020   right lateral chest   Anxiety    Atrial fibrillation (HCC) 05/30/2023   Basal cell carcinoma 09/18/2018   left upper back/one excised, one superficial   Basal cell carcinoma 11/28/2018   left post shoulder   Basal cell carcinoma 08/26/2020   left medial pretibial, EDC 10/2020   Basal cell carcinoma 03/15/2022   right neck, EDC   Basal cell carcinoma (BCC) 08/31/2022   Posterior base of neck superficial. 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   right lateral chest, EDC 12/09/20   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(HCC)    Venous stasis    Venous stasis    Vitamin B 12 deficiency    Past Surgical History:  Procedure Laterality Date   BIOPSY  05/30/2023   Procedure: BIOPSY;  Surgeon:  Avram Lupita BRAVO, MD;  Location: [facility] ENDOSCOPY;  Service: Gastroenterology;;   COLONOSCOPY W/ POLYPECTOMY     COLONOSCOPY WITH PROPOFOL  N/A 04/05/2018   Procedure: COLONOSCOPY WITH PROPOFOL ;  Surgeon: Viktoria Lamar DASEN, MD;  Location: [facility] ENDOSCOPY;  Service: Endoscopy;  Laterality: N/A;   COLOSTOMY     COLOSTOMY REVERSAL     CYST REMOVAL LEG     ENDOSCOPIC RETROGRADE CHOLANGIOPANCREATOGRAPHY (ERCP) WITH PROPOFOL  N/A 05/30/2023   Procedure: ENDOSCOPIC RETROGRADE CHOLANGIOPANCREATOGRAPHY (ERCP) WITH PROPOFOL ;  Surgeon: Avram Lupita BRAVO, MD;  Location: [facility] ENDOSCOPY;  Service: Gastroenterology;  Laterality: N/A;   KNEE ARTHROPLASTY Right 12/27/2015   Procedure: COMPUTER ASSISTED TOTAL KNEE ARTHROPLASTY;  Surgeon: Lynwood SHAUNNA Hue, MD;  Location: [facility] ORS;  Service: Orthopedics;  Laterality: Right;   KNEE ARTHROSCOPY Left    LIPOMA EXCISION     right thigh   LITHOTRIPSY     PANCREATIC STENT PLACEMENT  05/30/2023   Procedure: PANCREATIC STENT PLACEMENT;  Surgeon: Avram Lupita BRAVO, MD;  Location: [facility] ENDOSCOPY;  Service: Gastroenterology;;   TOTAL KNEE ARTHROPLASTY Left 10/16/2012   Patient Active Problem List   Diagnosis Date  Noted   Protein-calorie malnutrition, severe 06/02/2023   Calculus of GB and bile duct w acute cholecyst w/o obst 06/02/2023   Acute pancreatitis without infection or necrosis 06/01/2023   Post-ERCP acute pancreatitis 05/31/2023   Choledocholithiasis 05/31/2023   Gastritis and gastroduodenitis 05/30/2023   Atrial fibrillation (HCC) 05/30/2023   Elevated bilirubin 05/29/2023   Generalized abdominal pain 05/29/2023   Cholelithiasis with choledocholithiasis 05/29/2023   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   Elevated LFTs 05/28/2023   OSA on CPAP 05/28/2023   Ascending aortic aneurysm (HCC) 07/26/2020   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   S/P total knee arthroplasty 12/27/2015   Major depressive disorder with single episode, in full remission (HCC) 12/23/2015    Anxiety 11/16/2015   Benign essential hypertension 11/16/2015   Depression 11/16/2015   Hypersomnia with sleep apnea 11/16/2015   Osteoarthritis 11/16/2015   Pure hypercholesterolemia 11/16/2015   Abdominal aortic aneurysm without rupture (HCC) 12/23/2014   Chronic kidney disease, stage 3 unspecified (HCC) 12/17/2014    ONSET DATE: 02/05/24  REFERRING DIAG:  Diagnosis  E44.1 (ICD-10-CM) - Mild protein-calorie malnutrition    THERAPY DIAG:  Difficulty in walking, not elsewhere classified  Muscle weakness (generalized)  Unsteadiness on feet  Rationale for Evaluation and Treatment: Rehabilitation  SUBJECTIVE:                                                                                                                                                                                             SUBJECTIVE STATEMENT:   Patient reports some mild pain in his hips and across his stomach. Still feeling pretty stiff since last session.   Pt accompanied by: self  PERTINENT HISTORY:   Pt known to clinic (see previous hx below), now returns s/p hepatectomy resection of liver, right lobectomy 02/05/2024.  Prior to surgery with previous bout of PT: The pt is a pleasant 78 yo male presenting to PT with concerns of weakness with recent findings of liver metastasis with unknown primary site. Pt reports gallbladder attack July 14th 2024, went to ED, reports they found a stone blockage. Pt then transferred to [facility] where ERCP was performed to remove stones of common bile duct. This was complicated by pancreatitis, heart failure and new onset of a-fib. He reports MRI taken at time where they found cancer in his liver. Pt has undergone 8 sessions of chemo, he also has a cholecystostomy tube that rests on his R side and down RLE. Pt reports future plans for procedure to cut off blood supply to R side of his liver and to remove gallbladder. Pt also with recent R portal vein, R  hepatic vein  embolization 12/04/23... 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  PMH significant for CA, mets to liver, HF, hx of 2 knee replacements (years ago), a-fib, AAA, basal cell carcinoma, chronic kidney disease, diverticulitis, HTN, melanoma, OA, sleep apnea, vitamin b12 deficiency, chemo 07/2023, 10/2023 cholecystostomy tube placement, 12/04/2023 R portal vein, R hepatic vein emobolization, please refer to chart for full details.   PAIN:  Are you having pain? Reports occ abdominal pain   PRECAUTIONS:  Pt previously had 10# lifting restrictions, but reports those have since been removed, but to progress activity gradually. He says he has not currently worked with more than 3# weights with previous HH PT. Pt also did just recently have cholecystostomy tube removed as of last Monday.  Hx of ascending thoracic aneurysm under surveillance    RED FLAGS: Pt with hx of abdominal aneurysm but it is unchanged  WEIGHT BEARING RESTRICTIONS: No  FALLS: Has patient fallen in last 6 months?  LIVING ENVIRONMENT: via chart (from previous outpatient PT course) Lives with: lives with their spouse and has a dog Lives in: House/apartment Stairs: 2 external steps with handrail on L going up Has following equipment at home: shower chair  PLOF: Reports did need some assistance with ADLs until drain recently removed  PATIENT GOALS:  Pt wants to get stronger, get rid of some excess fluid.  OBJECTIVE:  Note: Objective measures were completed at Evaluation unless otherwise noted.  DIAGNOSTIC FINDINGS: via chart   CT abdomen pelvis 04/04/2024 Findings:   - Lower Thorax: No suspicious pulmonary abnormalities. Similar right  pleural thickening with trace effusion.   - Liver: Similar findings of prior right hepatic lobe resection.  Percutaneous drainage catheter is in place with continued reduction in size  of postoperative fluid collection, now measuring approximately  5.7 x 3.3  cm, previously 7.7 x 4.8 cm. Tiny cysts elsewhere in the liver without  findings of recurrence. No suspicious hepatic masses are identified.  The  portal and hepatic veins are patent.   - Biliary and Gallbladder: No intrahepatic or extrahepatic bile duct  dilation. The gallbladder surgically absent.   - Spleen: Normal in appearance.    - Pancreas: Numerous cystic lesions scattered throughout the spleen,  stable, measuring up to 2.4 cm in the tail. No solid lesions or pancreatic  ductal dilation.   - Adrenal Glands: Normal in appearance.   - Kidneys: Similar benign renal cysts bilaterally measuring up to 9.2 cm in  the lower pole of the right kidney. No suspicious renal lesions. No  hydronephrosis.   - Abdominal and Pelvic Vasculature: No abdominal aortic aneurysm. Moderate  atherosclerotic plaque.   - Gastrointestinal Tract: No abnormal dilation or wall thickening.   - Peritoneum/Mesentery/Retroperitoneum: No free fluid.  No free  intraperitoneal air.   - Lymph Nodes: No retroperitoneal, mesenteric, pelvic, or inguinal  lymphadenopathy.    - Bladder: Similar intraluminal bladder calculi with chronic bladder wall  thickening.   - Pelvic Organs: Unremarkable prostate.   - Body Wall: Continued interval decrease in size of right chest/abdominal  wall hematoma, measuring 9.1 x 2.0 cm, previously 13.2 x 3.6 cm.   - Musculoskeletal:  No aggressive appearing osseous lesions. There are  moderate degenerative changes of the lumbosacral spine.   Impression:  1. Continued interval reduction in size of right upper quadrant fluid  collection and chest/abdominal wall hematoma.  2. No evidence of metastatic disease in abdomen or pelvis.   Electronically Signed  by:  Toya Irving, MD, [facility]  Electronically Signed on:  04/04/2024 12:26 PM   COGNITION: Overall cognitive status: Within functional limits for tasks assessed   SENSATION: Reports no n/t anywhere    COORDINATION: WFL rapid alt movement BUE/BLE Heel>shin WNL bilat LE  EDEMA:  Pt with bilat ankle swelling (reports since surgery), planning to get compression socks  POSTURE: Slight hunched/fwd posture and rounded shoulders  LOWER EXTREMITY MMT:    MMT Right Eval Left Eval  Hip flexion 4 4  Hip extension    Hip abduction 4+ 4+  Hip adduction 4 4+  Hip internal rotation    Hip external rotation    Knee flexion 5 5  Knee extension 5 5  Ankle dorsiflexion 4+ 4+  Ankle plantarflexion 5 5  Ankle inversion    Ankle eversion    (Blank rows = not tested)  STAIRS: Not assessed on this date GAIT: No AD, pt with decreased R heel strike, decreased bilateral hip extension, and flexed/hunched posture.   FUNCTIONAL TESTS:  5 times sit to stand: 12.5 seconds with use of UE  : 1.23 m/s  :  1066 ft without an AD FGA: assessed on 04/30/24:  PATIENT SURVEYS:  ABC scale  today 91.9%                                                                                                                              TREATMENT DATE: 06/04/24   Sit to stand 2 x 10 with no UE support    Standing at support bar with 2.5# AW donned: - Calf raises x15 - Marches x15 - Straight leg abduction x15    Weighted ambulation with 2.5# AW donned 17ft x 3 laps   Fwd step over 3 half bolsters in // bars x 3 laps  2.5# AW Lateral step over 3 half bolsters in // bars x 3 laps  2.5# AW  Step taps 4 step with 2.5# AW x 10 with 2.5# AW    Repeat weighted ambulation with 2.5# AW donned 182ft x3 laps  Seated LAQ 2# AW x 10 with 3 second hold    Frequent seated rest breaks due to generalized fatigue   PATIENT EDUCATION: Education details:exercise technique Pt educated throughout session about proper posture and technique with exercises. Improved exercise technique, movement at target joints, use of target muscles after min to mod verbal, visual, tactile cues. HEP update  Person educated:  Patient Education method: Explanation, Demonstration, and Verbal cues, printout Education comprehension: verbalized understanding and returned demonstration  HOME EXERCISE PROGRAM: Access Code: [email or web address] URL: [email or web address] Date: 04/30/2024 Prepared by: Darryle Patten  Exercises - Mini Squat with Counter Support  - 1 x daily - 3 x weekly - 2 sets - 10 reps - Lunge with Counter Support  - 1 x daily - 3 x weekly - 2 sets - 10 reps   Access Code: [email or web address] URL: [email or web address] Date: 04/28/2024 Prepared by: Darryle  Barak  Exercises - Standing March with Counter Support  - 1-2 x daily - 3-5 x weekly - 2 sets - 10 reps - Standing Hip Abduction with Counter Support  - 1-2 x daily - 3-5 x weekly - 2 sets - 10 reps  GOALS: Goals reviewed with patient? Yes   SHORT TERM GOALS: Target date: 06/09/2024    Patient will be independent in home exercise program to improve strength/mobility for better functional independence with ADLs. Baseline: initiated  Goal status: MET   LONG TERM GOALS: Target date: 07/21/2024   Patient will increase ABC scale score >95% to demonstrate better functional mobility and better confidence with ADLs and return to confidence levels pt achieved with previous bout of therapy Baseline:  today 91.9% (prior to previous PT d/c reached 96%) 7/21: 96%  Goal status: MET  2.  Patient will complete five times sit to stand test in < 10 seconds indicating an increased LE strength and mm power. Baseline: 12.5 seconds with use of UE (previous PT course pt achieved 10.6 sec) 7/21: 11.66sec no Use of BUE Goal status: INITIAL  3. Patient will increase Functional Gait Assessment score to >27/30 as to reduce fall risk and improve dynamic gait safety with community ambulation  Baseline: 21/30- performed on 04/30/24 Goal status: INITIAL  4.  Patient will increase 10 meter walk test to >1.2 m/s as to improve gait speed for better community  ambulation and return to or exceed level of function reached with previous bout of PT. Baseline: today at eval 1.23 m/s (previous PT reached 2.1 m/s on 3/19) 7/21: normal walking speed: 0.31m/s. fast walking speed. 1.53m/s Goal status: MET  5.  Patient will increase six minute walk test distance to >1500 ft to achieve or exceed PLOF and improve functional capacity  Baseline: 1066 ft without AD (in previous bout of PT achieved 1596 ft) 7/21: 1124ft no AD with increased back stiffness on this day Goal status: INITIAL     ASSESSMENT:  CLINICAL IMPRESSION:   Patient arrives to treatment session with complaints of discomfort in hips and abdomen. Session focused on improving cardiorespiratory endurance and BLE strength. Requires frequent seated rest breaks due to generalized fatigue. Pt will benefit from further skilled PT to improve the aforementioned deficits in order increase strength, gait speed/functional capacity, balance and balance confidence, and return to PLOF.   OBJECTIVE IMPAIRMENTS: Abnormal gait, decreased activity tolerance, decreased balance, decreased endurance, difficulty walking, decreased strength, increased edema, improper body mechanics, postural dysfunction, and pain.   ACTIVITY LIMITATIONS: carrying, lifting, squatting, stairs, transfers, and locomotion level  PARTICIPATION LIMITATIONS: cleaning, shopping, community activity, and yard work  PERSONAL FACTORS: Age, Time since onset of injury/illness/exacerbation, and 3+ comorbidities: PMH significant for CA, mets to liver, HF, hx of 2 knee replacements (years ago), a-fib, AAA, basal cell carcinoma, chronic kidney disease, diverticulitis, HTN, melanoma, OA, sleep apnea, vitamin b12 deficiency, chemo 07/2023, 10/2023 cholecystostomy tube placement, 12/04/2023 R portal vein, R hepatic vein emobolization, please refer to chart for full details. are also affecting patient's functional outcome.   REHAB POTENTIAL: Good  CLINICAL  DECISION MAKING: Evolving/moderate complexity  EVALUATION COMPLEXITY: Moderate  PLAN:  PT FREQUENCY: 1-2x/week  PT DURATION: 12 weeks  PLANNED INTERVENTIONS: [id number]- PT Re-evaluation, 97750- Physical Performance Testing, 97110-Therapeutic exercises, 97530- Therapeutic activity, 97112- Neuromuscular re-education, 97535- Self Care, [id number]- Manual therapy, [id number]- Gait training, [phone]- Orthotic Initial, [phone]- Orthotic/Prosthetic subsequent, [phone]- Canalith repositioning, Patient/Family education, Balance training, Stair training, Taping, Joint mobilization, Spinal mobilization, Scar mobilization, Vestibular  training, DME instructions, Cryotherapy, and Moist heat  PLAN FOR NEXT SESSION:   add to HEP as appropriate, strength, endurance,  progressive dynamic balance training  Maryanne Finder PT, DPT  Physical Therapist - [facility]  [facility]    06/04/2024, 10:54 AM

## 2024-06-09 ENCOUNTER — Ambulatory Visit

## 2024-06-09 DIAGNOSIS — M6281 Muscle weakness (generalized): Secondary | ICD-10-CM

## 2024-06-09 DIAGNOSIS — R2681 Unsteadiness on feet: Secondary | ICD-10-CM

## 2024-06-09 DIAGNOSIS — R262 Difficulty in walking, not elsewhere classified: Secondary | ICD-10-CM

## 2024-06-09 NOTE — Therapy (Signed)
 OUTPATIENT PHYSICAL THERAPY TREATMENT   Patient Name: Alexander [place] MRN: [id number] DOB:09-05-1946, 78 y.o., male Today's Date: 06/09/2024  PCP: Fernande Ophelia JINNY DOUGLAS, MD  REFERRING PROVIDER: Fernande Ophelia JINNY DOUGLAS, MD   END OF SESSION:  PT End of Session - 06/09/24 1415     Visit Number 12    Number of Visits 25    Date for PT Re-Evaluation 07/21/24    PT Start Time 1405    PT Stop Time 1443    PT Time Calculation (min) 38 min    Activity Tolerance Patient tolerated treatment well;No increased pain;Patient limited by fatigue    Behavior During Therapy [facility] for tasks assessed/performed           Past Medical History:  Diagnosis Date   AAA (abdominal aortic aneurysm) without rupture (HCC)    Abdominal aneurysm (HCC)    3.5 cm   Actinic keratosis 08/26/2020   right lateral chest   Anxiety    Atrial fibrillation (HCC) 05/30/2023   Basal cell carcinoma 09/18/2018   left upper back/one excised, one superficial   Basal cell carcinoma 11/28/2018   left post shoulder   Basal cell carcinoma 08/26/2020   left medial pretibial, EDC 10/2020   Basal cell carcinoma 03/15/2022   right neck, EDC   Basal cell carcinoma (BCC) 08/31/2022   Posterior base of neck superficial. 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 stones   Colon polyp    Depression    Diverticulitis    Diverticulosis    Hyperlipemia    Hypertension    Lipoma    Melanoma (HCC) 05/08/2018   in [place] at right upper back/excision   Neoplasm    benign, colon   Onychomycosis    Osteoarthritis    Osteoarthritis    Rheumatic fever    Rheumatic fever    Sleep apnea    Squamous cell carcinoma of skin 10/13/2020   right lateral chest, EDC 12/09/20   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(HCC)    Venous stasis    Venous stasis    Vitamin B 12 deficiency    Past Surgical History:  Procedure Laterality Date   BIOPSY  05/30/2023   Procedure: BIOPSY;  Surgeon: Avram Lupita BRAVO, MD;  Location: [facility] ENDOSCOPY;  Service:  Gastroenterology;;   COLONOSCOPY W/ POLYPECTOMY     COLONOSCOPY WITH PROPOFOL  N/A 04/05/2018   Procedure: COLONOSCOPY WITH PROPOFOL ;  Surgeon: Viktoria Lamar DASEN, MD;  Location: [facility] ENDOSCOPY;  Service: Endoscopy;  Laterality: N/A;   COLOSTOMY     COLOSTOMY REVERSAL     CYST REMOVAL LEG     ENDOSCOPIC RETROGRADE CHOLANGIOPANCREATOGRAPHY (ERCP) WITH PROPOFOL  N/A 05/30/2023   Procedure: ENDOSCOPIC RETROGRADE CHOLANGIOPANCREATOGRAPHY (ERCP) WITH PROPOFOL ;  Surgeon: Avram Lupita BRAVO, MD;  Location: [facility] ENDOSCOPY;  Service: Gastroenterology;  Laterality: N/A;   KNEE ARTHROPLASTY Right 12/27/2015   Procedure: COMPUTER ASSISTED TOTAL KNEE ARTHROPLASTY;  Surgeon: Lynwood SHAUNNA Hue, MD;  Location: [facility] ORS;  Service: Orthopedics;  Laterality: Right;   KNEE ARTHROSCOPY Left    LIPOMA EXCISION     right thigh   LITHOTRIPSY     PANCREATIC STENT PLACEMENT  05/30/2023   Procedure: PANCREATIC STENT PLACEMENT;  Surgeon: Avram Lupita BRAVO, MD;  Location: [facility] ENDOSCOPY;  Service: Gastroenterology;;   TOTAL KNEE ARTHROPLASTY Left 10/16/2012   Patient Active Problem List   Diagnosis Date Noted   Protein-calorie malnutrition, severe 06/02/2023   Calculus of GB and bile duct w acute cholecyst  w/o obst 06/02/2023   Acute pancreatitis without infection or necrosis 06/01/2023   Post-ERCP acute pancreatitis 05/31/2023   Choledocholithiasis 05/31/2023   Gastritis and gastroduodenitis 05/30/2023   Atrial fibrillation (HCC) 05/30/2023   Elevated bilirubin 05/29/2023   Generalized abdominal pain 05/29/2023   Cholelithiasis with choledocholithiasis 05/29/2023   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   Elevated LFTs 05/28/2023   OSA on CPAP 05/28/2023   Ascending aortic aneurysm (HCC) 07/26/2020   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   S/P total knee arthroplasty 12/27/2015   Major depressive disorder with single episode, in full remission (HCC) 12/23/2015   Anxiety 11/16/2015   Benign essential hypertension  11/16/2015   Depression 11/16/2015   Hypersomnia with sleep apnea 11/16/2015   Osteoarthritis 11/16/2015   Pure hypercholesterolemia 11/16/2015   Abdominal aortic aneurysm without rupture (HCC) 12/23/2014   Chronic kidney disease, stage 3 unspecified (HCC) 12/17/2014   ONSET DATE: 02/05/24  REFERRING DIAG:  Diagnosis  E44.1 (ICD-10-CM) - Mild protein-calorie malnutrition   THERAPY DIAG:  Difficulty in walking, not elsewhere classified  Muscle weakness (generalized)  Unsteadiness on feet  Rationale for Evaluation and Treatment: Rehabilitation  SUBJECTIVE:                                                                                                                                                                                             SUBJECTIVE STATEMENT:   Pt reports still taking chemo pills, but tolerating them fairly well.  No additional updates since previous visit. Back/hips stiff but looseing up with activity.   Pt accompanied by: self  PERTINENT HISTORY:   Pt known to clinic (see previous hx below), now returns s/p hepatectomy resection of liver, right lobectomy 02/05/2024.  Prior to surgery with previous bout of PT: The pt is a pleasant 78 yo male presenting to PT with concerns of weakness with recent findings of liver metastasis with unknown primary site. Pt reports gallbladder attack July 14th 2024, went to ED, reports they found a stone blockage. Pt then transferred to [facility] where ERCP was performed to remove stones of common bile duct. This was complicated by pancreatitis, heart failure and new onset of a-fib. He reports MRI taken at time where they found cancer in his liver. Pt has undergone 8 sessions of chemo, he also has a cholecystostomy tube that rests on his R side and down RLE. Pt reports future plans for procedure to cut off blood supply to R side of his liver and to remove gallbladder. Pt also with recent R portal vein, R hepatic vein embolization  12/04/23... 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   PMH significant for CA, mets to liver, HF, hx of 2 knee replacements (years ago), a-fib, AAA, basal cell carcinoma, chronic kidney disease, diverticulitis, HTN, melanoma, OA, sleep apnea, vitamin b12 deficiency, chemo 07/2023, 10/2023 cholecystostomy tube placement, 12/04/2023 R portal vein, R hepatic vein emobolization, please refer to chart for full details.   PAIN:  Are you having pain? No pain today.   PRECAUTIONS:  None at the moment;   WEIGHT BEARING RESTRICTIONS: No  FALLS: Has patient fallen in last 6 months? No  LIVING ENVIRONMENT: via chart (from previous outpatient PT course) Lives with: lives with their spouse and has a dog Lives in: House/apartment Stairs: 2 external steps with handrail on L going up Has following equipment at home: shower chair  PLOF: Reports did need some assistance with ADLs until drain recently removed  PATIENT GOALS:  Pt wants to get stronger, get rid of some excess fluid.  OBJECTIVE:  Note: Objective measures were completed at Evaluation unless otherwise noted.                                                                                                                           TREATMENT DATE: 06/09/24 -99ft AMB, no device, no LOB 5 minutes 7 seconds -sit to stand 2 x 10 with no UE support    seated recovery interval   -standing marching c 2.5lb AW bilat 1x10 bilat  -standing double heel raises x15 (2.5lb AW bilat)  -60lb in chair sled push 1x16ft c 2.5lb AW bilat   seated recovery interval    -standing marching c 2.5lb AW bilat 1x10 bilat  -standing double heel raises x15 (2.5lb AW bilat)  -60lb in chair sled push 1x175ft c 2.5lb AW bilat    PATIENT EDUCATION: Education details:exercise technique Pt educated throughout session about proper posture and technique with exercises. Improved exercise technique, movement at target joints, use of  target muscles after min to mod verbal, visual, tactile cues. HEP update  Person educated: Patient Education method: Explanation, Demonstration, and Verbal cues, printout Education comprehension: verbalized understanding and returned demonstration  HOME EXERCISE PROGRAM: Access Code: [email or web address] URL: [email or web address] Date: 04/30/2024 Prepared by: Darryle Patten  Exercises - Mini Squat with Counter Support  - 1 x daily - 3 x weekly - 2 sets - 10 reps - Lunge with Counter Support  - 1 x daily - 3 x weekly - 2 sets - 10 reps  Access Code: [email or web address] URL: [email or web address] Date: 04/28/2024 Prepared by: Darryle Patten  Exercises - Standing March with Counter Support  - 1-2 x daily - 3-5 x weekly - 2 sets - 10 reps - Standing Hip Abduction with Counter Support  - 1-2 x daily - 3-5 x weekly - 2 sets - 10 reps  GOALS: Goals reviewed with patient? Yes  SHORT TERM GOALS: Target date: 06/09/2024  Patient will be independent in home exercise program to improve strength/mobility for better functional independence with ADLs. Baseline:  initiated  Goal status: MET  LONG TERM GOALS: Target date: 07/21/2024  Patient will increase ABC scale score >95% to demonstrate better functional mobility and better confidence with ADLs and return to confidence levels pt achieved with previous bout of therapy Baseline:  today 91.9% (prior to previous PT d/c reached 96%) 7/21: 96%  Goal status: MET  2.  Patient will complete five times sit to stand test in < 10 seconds indicating an increased LE strength and mm power. Baseline: 12.5 seconds with use of UE (previous PT course pt achieved 10.6 sec) 7/21: 11.66sec no Use of BUE Goal status: INITIAL  3. Patient will increase Functional Gait Assessment score to >27/30 as to reduce fall risk and improve dynamic gait safety with community ambulation  Baseline: 21/30- performed on 04/30/24 Goal status: INITIAL  4.  Patient will  increase 10 meter walk test to >1.2 m/s as to improve gait speed for better community ambulation and return to or exceed level of function reached with previous bout of PT. Baseline: today at eval 1.23 m/s (previous PT reached 2.1 m/s on 3/19) 7/21: normal walking speed: 0.74m/s. fast walking speed. 1.18m/s Goal status: MET  5.  Patient will increase six minute walk test distance to >1500 ft to achieve or exceed PLOF and improve functional capacity  Baseline: 1066 ft without AD (in previous bout of PT achieved 1596 ft) 7/21: 1180ft no AD with increased back stiffness on this day Goal status: INITIAL  ASSESSMENT:  CLINICAL IMPRESSION:   Conitnued with current program, work toward LT goals. Mild decrease in volume on some items, but otherwise consistent activity tolerance. Pt will benefit from further skilled PT to improve the aforementioned deficits in order increase strength, gait speed/functional capacity, balance and balance confidence, and return to PLOF.   OBJECTIVE IMPAIRMENTS: Abnormal gait, decreased activity tolerance, decreased balance, decreased endurance, difficulty walking, decreased strength, increased edema, improper body mechanics, postural dysfunction, and pain.   ACTIVITY LIMITATIONS: carrying, lifting, squatting, stairs, transfers, and locomotion level  PARTICIPATION LIMITATIONS: cleaning, shopping, community activity, and yard work  PERSONAL FACTORS: Age, Time since onset of injury/illness/exacerbation, and 3+ comorbidities: PMH significant for CA, mets to liver, HF, hx of 2 knee replacements (years ago), a-fib, AAA, basal cell carcinoma, chronic kidney disease, diverticulitis, HTN, melanoma, OA, sleep apnea, vitamin b12 deficiency, chemo 07/2023, 10/2023 cholecystostomy tube placement, 12/04/2023 R portal vein, R hepatic vein emobolization, please refer to chart for full details. are also affecting patient's functional outcome.   REHAB POTENTIAL: Good  CLINICAL DECISION  MAKING: Evolving/moderate complexity  EVALUATION COMPLEXITY: Moderate  PLAN:  PT FREQUENCY: 1-2x/week  PT DURATION: 12 weeks  PLANNED INTERVENTIONS: [id number]- PT Re-evaluation, 97750- Physical Performance Testing, 97110-Therapeutic exercises, 97530- Therapeutic activity, 97112- Neuromuscular re-education, 97535- Self Care, [id number]- Manual therapy, [phone]- Gait training, [phone]- Orthotic Initial, [phone]- Orthotic/Prosthetic subsequent, [phone]- Canalith repositioning, Patient/Family education, Balance training, Stair training, Taping, Joint mobilization, Spinal mobilization, Scar mobilization, Vestibular training, DME instructions, Cryotherapy, and Moist heat  PLAN FOR NEXT SESSION:   add to HEP as appropriate, strength, endurance,  progressive dynamic balance training  2:44 PM, 06/09/24 Peggye JAYSON Linear, PT, DPT Physical Therapist - [facility]  Outpatient Physical Therapy- Main Campus [phone]

## 2024-06-11 ENCOUNTER — Ambulatory Visit

## 2024-06-11 DIAGNOSIS — R2681 Unsteadiness on feet: Secondary | ICD-10-CM

## 2024-06-11 DIAGNOSIS — M6281 Muscle weakness (generalized): Secondary | ICD-10-CM | POA: Diagnosis not present

## 2024-06-11 DIAGNOSIS — R262 Difficulty in walking, not elsewhere classified: Secondary | ICD-10-CM

## 2024-06-11 NOTE — Therapy (Signed)
 OUTPATIENT PHYSICAL THERAPY TREATMENT   Patient Name: Alexander Howe MRN: [id number] DOB:03-29-1946, 78 y.o., male Today's Date: 06/11/2024  PCP: Fernande Ophelia JINNY DOUGLAS, MD  REFERRING PROVIDER: Fernande Ophelia JINNY DOUGLAS, MD   END OF SESSION:  PT End of Session - 06/11/24 1423     Visit Number 13    Number of Visits 25    Date for PT Re-Evaluation 07/21/24    Progress Note Due on Visit 20    PT Start Time 1400    PT Stop Time 1440    PT Time Calculation (min) 40 min    Equipment Utilized During Treatment Gait belt    Activity Tolerance Patient tolerated treatment well;No increased pain;Patient limited by fatigue    Behavior During Therapy [facility] for tasks assessed/performed           Past Medical History:  Diagnosis Date   AAA (abdominal aortic aneurysm) without rupture (HCC)    Abdominal aneurysm (HCC)    3.5 cm   Actinic keratosis 08/26/2020   right lateral chest   Anxiety    Atrial fibrillation (HCC) 05/30/2023   Basal cell carcinoma 09/18/2018   left upper back/one excised, one superficial   Basal cell carcinoma 11/28/2018   left post shoulder   Basal cell carcinoma 08/26/2020   left medial pretibial, EDC 10/2020   Basal cell carcinoma 03/15/2022   right neck, EDC   Basal cell carcinoma (BCC) 08/31/2022   Posterior base of neck superficial. 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   right lateral chest, EDC 12/09/20   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(HCC)    Venous stasis    Venous stasis    Vitamin B 12 deficiency    Past Surgical History:  Procedure Laterality Date   BIOPSY  05/30/2023    Procedure: BIOPSY;  Surgeon: Avram Lupita BRAVO, MD;  Location: [facility] ENDOSCOPY;  Service: Gastroenterology;;   COLONOSCOPY W/ POLYPECTOMY     COLONOSCOPY WITH PROPOFOL  N/A 04/05/2018   Procedure: COLONOSCOPY WITH PROPOFOL ;  Surgeon: Viktoria Lamar DASEN, MD;  Location[facility] ENDOSCOPY;  Service: Endoscopy;  Laterality: N/A;   COLOSTOMY     COLOSTOMY REVERSAL     CYST REMOVAL LEG     ENDOSCOPIC RETROGRADE CHOLANGIOPANCREATOGRAPHY (ERCP) WITH PROPOFOL  N/A 05/30/2023   Procedure: ENDOSCOPIC RETROGRADE CHOLANGIOPANCREATOGRAPHY (ERCP) WITH PROPOFOL ;  Surgeon: Avram Lupita BRAVO, MD;  Location: [facility] ENDOSCOPY;  Service: Gastroenterology;  Laterality: N/A;   KNEE ARTHROPLASTY Right 12/27/2015   Procedure: COMPUTER ASSISTED TOTAL KNEE ARTHROPLASTY;  Surgeon: Lynwood SHAUNNA Hue, MD;  Location: [facility] ORS;  Service: Orthopedics;  Laterality: Right;   KNEE ARTHROSCOPY Left    LIPOMA EXCISION     right thigh   LITHOTRIPSY     PANCREATIC STENT PLACEMENT  05/30/2023   Procedure: PANCREATIC STENT PLACEMENT;  Surgeon: Avram Lupita BRAVO, MD;  Location: [facility] ENDOSCOPY;  Service: Gastroenterology;;   TOTAL KNEE ARTHROPLASTY Left 10/16/2012   Patient Active Problem List   Diagnosis Date  Noted   Protein-calorie malnutrition, severe 06/02/2023   Calculus of GB and bile duct w acute cholecyst w/o obst 06/02/2023   Acute pancreatitis without infection or necrosis 06/01/2023   Post-ERCP acute pancreatitis 05/31/2023   Choledocholithiasis 05/31/2023   Gastritis and gastroduodenitis 05/30/2023   Atrial fibrillation (HCC) 05/30/2023   Elevated bilirubin 05/29/2023   Generalized abdominal pain 05/29/2023   Cholelithiasis with choledocholithiasis 05/29/2023   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   Elevated LFTs 05/28/2023   OSA on CPAP 05/28/2023   Ascending aortic aneurysm (HCC) 07/26/2020   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   S/P total knee arthroplasty 12/27/2015   Major depressive disorder with single episode, in full  remission (HCC) 12/23/2015   Anxiety 11/16/2015   Benign essential hypertension 11/16/2015   Depression 11/16/2015   Hypersomnia with sleep apnea 11/16/2015   Osteoarthritis 11/16/2015   Pure hypercholesterolemia 11/16/2015   Abdominal aortic aneurysm without rupture (HCC) 12/23/2014   Chronic kidney disease, stage 3 unspecified (HCC) 12/17/2014   ONSET DATE: 02/05/24  REFERRING DIAG:  Diagnosis  E44.1 (ICD-10-CM) - Mild protein-calorie malnutrition   THERAPY DIAG:  Difficulty in walking, not elsewhere classified  Muscle weakness (generalized)  Unsteadiness on feet  Rationale for Evaluation and Treatment: Rehabilitation  SUBJECTIVE:                                                                                                                                                                                             SUBJECTIVE STATEMENT:   Pt a little sore in Rt lateral hip (chronic problem) after last session, also sore today. Energy ;levels have improved today for the most part.   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 Pt reports gallbladder attack July 14th 2024, went to ED, reports they found a stone blockage. Pt then transferred to [facility] where ERCP was performed to remove stones of common bile duct. This was complicated by pancreatitis, heart failure and new onset of a-fib. He reports MRI taken at time where they found cancer in his liver. Pt has undergone 8 sessions of chemo, he also has a cholecystostomy tube that rests on his R side and down RLE. Pt reports future plans for procedure to cut off blood supply to R side of his liver and to remove gallbladder. Pt also with recent R portal vein, R hepatic vein embolization 12/04/23... 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 PMH significant for  CA, mets to  liver, HF, hx of 2 knee replacements (years ago), a-fib, AAA, basal cell carcinoma, chronic kidney disease, diverticulitis, HTN, melanoma, OA, sleep apnea, vitamin b12 deficiency, chemo 07/2023, 10/2023 cholecystostomy tube placement, 12/04/2023 R portal vein, R hepatic vein emobolization, please refer to chart for full details.   PAIN:  Are you having pain? No pain today.   PRECAUTIONS:  None at the moment;   WEIGHT BEARING RESTRICTIONS: No  FALLS: Has patient fallen in last 6 months? No  LIVING ENVIRONMENT: via chart (from previous outpatient PT course) Lives with: lives with their spouse and has a dog Lives in: House/apartment Stairs: 2 external steps with handrail on L going up Has following equipment at home: shower chair  PLOF: Reports did need some assistance with ADLs until drain recently removed  PATIENT GOALS:  Pt wants to get stronger, get rid of some excess fluid.  OBJECTIVE:  Note: Objective measures were completed at Evaluation unless otherwise noted.                                                                                                                           TREATMENT DATE: 06/11/24 -950ft AMB, no device, no LOB 5 minutes 10 seconds (limited not by low energy but by elevated hip pain with turning)  -hooklying in 30 degree HOB: Rt gluteus medius release at 3:00, 4:00, 5:00; Rt hip LAD 2x30sec at 45 degrees flexion -sit to stand 1 x 10 with no UE support   -short sitting green silly ball squeeze 15x5secH (for femoroacetabular gapping)   -standing marching 0lb AW bilat 1x10x5secH bilat (dropped the weight as not to aggravate hiop again)  -standing double heel raises x20  -60lb in chair sled push 1x146ft c AW bilat  -90 degree trunk twists with overhead basketball rebounding x8 bilat  -60lb in chair sled push 1x176ft c AW bilat   PATIENT EDUCATION: Education details:exercise technique Pt educated throughout session about proper posture and  technique with exercises. Improved exercise technique, movement at target joints, use of target muscles after min to mod verbal, visual, tactile cues. HEP update  Person educated: Patient Education method: Explanation, Demonstration, and Verbal cues, printout Education comprehension: verbalized understanding and returned demonstration  HOME EXERCISE PROGRAM: Access Code: [email or web address] URL: [email or web address] Date: 04/30/2024 Prepared by: Darryle Patten  Exercises - Mini Squat with Counter Support  - 1 x daily - 3 x weekly - 2 sets - 10 reps - Lunge with Counter Support  - 1 x daily - 3 x weekly - 2 sets - 10 reps  Access Code: [email or web address] URL: [email or web address] Date: 04/28/2024 Prepared by: Darryle Patten  Exercises - Standing March with Counter Support  - 1-2 x daily - 3-5 x weekly - 2 sets - 10 reps - Standing Hip Abduction with Counter Support  - 1-2 x daily - 3-5 x weekly - 2 sets - 10 reps  GOALS: Goals reviewed with patient? Yes  SHORT TERM GOALS: Target date: 06/09/2024  Patient will  be independent in home exercise program to improve strength/mobility for better functional independence with ADLs. Baseline: initiated  Goal status: MET  LONG TERM GOALS: Target date: 07/21/2024  Patient will increase ABC scale score >95% to demonstrate better functional mobility and better confidence with ADLs and return to confidence levels pt achieved with previous bout of therapy Baseline:  today 91.9% (prior to previous PT d/c reached 96%) 7/21: 96%  Goal status: MET  2.  Patient will complete five times sit to stand test in < 10 seconds indicating an increased LE strength and mm power. Baseline: 12.5 seconds with use of UE (previous PT course pt achieved 10.6 sec) 7/21: 11.66sec no Use of BUE Goal status: INITIAL  3. Patient will increase Functional Gait Assessment score to >27/30 as to reduce fall risk and improve dynamic gait safety with community ambulation   Baseline: 21/30- performed on 04/30/24 Goal status: INITIAL  4.  Patient will increase 10 meter walk test to >1.2 m/s as to improve gait speed for better community ambulation and return to or exceed level of function reached with previous bout of PT. Baseline: today at eval 1.23 m/s (previous PT reached 2.1 m/s on 3/19) 7/21: normal walking speed: 0.64m/s. fast walking speed. 1.29m/s Goal status: MET  5.  Patient will increase six minute walk test distance to >1500 ft to achieve or exceed PLOF and improve functional capacity  Baseline: 1066 ft without AD (in previous bout of PT achieved 1596 ft) 7/21: 1111ft no AD with increased back stiffness on this day Goal status: INITIAL  ASSESSMENT:  CLINICAL IMPRESSION:   Conitnued with current program, work toward LT goals. Decided to look more closely at Rt hip issue which found benefit from myofascial release and longitudinal distraction. Mild decrease in volume on some items, but otherwise consistent activity tolerance. Pt will benefit from further skilled PT to improve the aforementioned deficits in order increase strength, gait speed/functional capacity, balance and balance confidence, and return to PLOF.   OBJECTIVE IMPAIRMENTS: Abnormal gait, decreased activity tolerance, decreased balance, decreased endurance, difficulty walking, decreased strength, increased edema, improper body mechanics, postural dysfunction, and pain.   ACTIVITY LIMITATIONS: carrying, lifting, squatting, stairs, transfers, and locomotion level  PARTICIPATION LIMITATIONS: cleaning, shopping, community activity, and yard work  PERSONAL FACTORS: Age, Time since onset of injury/illness/exacerbation, and 3+ comorbidities: PMH significant for CA, mets to liver, HF, hx of 2 knee replacements (years ago), a-fib, AAA, basal cell carcinoma, chronic kidney disease, diverticulitis, HTN, melanoma, OA, sleep apnea, vitamin b12 deficiency, chemo 07/2023, 10/2023 cholecystostomy tube  placement, 12/04/2023 R portal vein, R hepatic vein emobolization, please refer to chart for full details. are also affecting patient's functional outcome.   REHAB POTENTIAL: Good  CLINICAL DECISION MAKING: Evolving/moderate complexity  EVALUATION COMPLEXITY: Moderate  PLAN:  PT FREQUENCY: 1-2x/week  PT DURATION: 12 weeks  PLANNED INTERVENTIONS: 97164- PT Re-evaluation, 97750- Physical Performance Testing, 97110-Therapeutic exercises, 97530- Therapeutic activity, 97112- Neuromuscular re-education, 97535- Self Care, [id number]- Manual therapy, [phone]- Gait training, [phone]- Orthotic Initial, [phone]- Orthotic/Prosthetic subsequent, [phone]- Canalith repositioning, Patient/Family education, Balance training, Stair training, Taping, Joint mobilization, Spinal mobilization, Scar mobilization, Vestibular training, DME instructions, Cryotherapy, and Moist heat  PLAN FOR NEXT SESSION:  add to HEP as appropriate, strength, endurance,  progressive dynamic balance training  2:25 PM, 06/11/24 Peggye JAYSON Linear, PT, DPT Physical Therapist - [facility]  Outpatient Physical Therapy- [facility] [phone]

## 2024-06-16 ENCOUNTER — Ambulatory Visit: Attending: Internal Medicine | Admitting: Physical Therapy

## 2024-06-16 DIAGNOSIS — R262 Difficulty in walking, not elsewhere classified: Secondary | ICD-10-CM | POA: Insufficient documentation

## 2024-06-16 DIAGNOSIS — M6281 Muscle weakness (generalized): Secondary | ICD-10-CM | POA: Insufficient documentation

## 2024-06-16 DIAGNOSIS — R2681 Unsteadiness on feet: Secondary | ICD-10-CM | POA: Insufficient documentation

## 2024-06-16 NOTE — Therapy (Signed)
 OUTPATIENT PHYSICAL THERAPY TREATMENT   Patient Name: Alexander Howe MRN: [id number] DOB:Aug 11, 1946, 78 y.o., male Today's Date: 06/16/2024  PCP: Fernande Ophelia JINNY DOUGLAS, MD  REFERRING PROVIDER: Fernande Ophelia JINNY DOUGLAS, MD   END OF SESSION:  PT End of Session - 06/16/24 1622     Visit Number 14    Number of Visits 25    Date for PT Re-Evaluation 07/21/24    Progress Note Due on Visit 20    PT Start Time 1452    PT Stop Time 1530    PT Time Calculation (min) 38 min    Equipment Utilized During Treatment Gait belt    Activity Tolerance Patient tolerated treatment well;No increased pain;Patient limited by fatigue    Behavior During Therapy [facility] for tasks assessed/performed            Past Medical History:  Diagnosis Date   AAA (abdominal aortic aneurysm) without rupture (HCC)    Abdominal aneurysm (HCC)    3.5 cm   Actinic keratosis 08/26/2020   right lateral chest   Anxiety    Atrial fibrillation (HCC) 05/30/2023   Basal cell carcinoma 09/18/2018   left upper back/one excised, one superficial   Basal cell carcinoma 11/28/2018   left post shoulder   Basal cell carcinoma 08/26/2020   left medial pretibial, EDC 10/2020   Basal cell carcinoma 03/15/2022   right neck, EDC   Basal cell carcinoma (BCC) 08/31/2022   Posterior base of neck superficial. 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   right lateral chest, EDC 12/09/20   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(HCC)    Venous stasis    Venous stasis    Vitamin B 12 deficiency    Past Surgical History:  Procedure Laterality Date   BIOPSY  05/30/2023    Procedure: BIOPSY;  Surgeon: Avram Lupita BRAVO, MD;  Location: [facility] ENDOSCOPY;  Service: Gastroenterology;;   COLONOSCOPY W/ POLYPECTOMY     COLONOSCOPY WITH PROPOFOL  N/A 04/05/2018   Procedure: COLONOSCOPY WITH PROPOFOL ;  Surgeon: Viktoria Lamar DASEN, MD;  Location: [facility] ENDOSCOPY;  Service: Endoscopy;  Laterality: N/A;   COLOSTOMY     COLOSTOMY REVERSAL     CYST REMOVAL LEG     ENDOSCOPIC RETROGRADE CHOLANGIOPANCREATOGRAPHY (ERCP) WITH PROPOFOL  N/A 05/30/2023   Procedure: ENDOSCOPIC RETROGRADE CHOLANGIOPANCREATOGRAPHY (ERCP) WITH PROPOFOL ;  Surgeon: Avram Lupita BRAVO, MD;  Location: [facility] ENDOSCOPY;  Service: Gastroenterology;  Laterality: N/A;   KNEE ARTHROPLASTY Right 12/27/2015   Procedure: COMPUTER ASSISTED TOTAL KNEE ARTHROPLASTY;  Surgeon: Lynwood SHAUNNA Hue, MD;  Location: [facility] ORS;  Service: Orthopedics;  Laterality: Right;   KNEE ARTHROSCOPY Left    LIPOMA EXCISION     right thigh   LITHOTRIPSY     PANCREATIC STENT PLACEMENT  05/30/2023   Procedure: PANCREATIC STENT PLACEMENT;  Surgeon: Avram Lupita BRAVO, MD;  Location: [facility] ENDOSCOPY;  Service: Gastroenterology;;   TOTAL KNEE ARTHROPLASTY Left 10/16/2012   Patient Active Problem List   Diagnosis  Date Noted   Protein-calorie malnutrition, severe 06/02/2023   Calculus of GB and bile duct w acute cholecyst w/o obst 06/02/2023   Acute pancreatitis without infection or necrosis 06/01/2023   Post-ERCP acute pancreatitis 05/31/2023   Choledocholithiasis 05/31/2023   Gastritis and gastroduodenitis 05/30/2023   Atrial fibrillation (HCC) 05/30/2023   Elevated bilirubin 05/29/2023   Generalized abdominal pain 05/29/2023   Cholelithiasis with choledocholithiasis 05/29/2023   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   Elevated LFTs 05/28/2023   OSA on CPAP 05/28/2023   Ascending aortic aneurysm (HCC) 07/26/2020   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   S/P total knee arthroplasty 12/27/2015   Major depressive disorder with single episode, in full  remission (HCC) 12/23/2015   Anxiety 11/16/2015   Benign essential hypertension 11/16/2015   Depression 11/16/2015   Hypersomnia with sleep apnea 11/16/2015   Osteoarthritis 11/16/2015   Pure hypercholesterolemia 11/16/2015   Abdominal aortic aneurysm without rupture (HCC) 12/23/2014   Chronic kidney disease, stage 3 unspecified (HCC) 12/17/2014   ONSET DATE: 02/05/24  REFERRING DIAG:  Diagnosis  E44.1 (ICD-10-CM) - Mild protein-calorie malnutrition   THERAPY DIAG:  Difficulty in walking, not elsewhere classified  Muscle weakness (generalized)  Unsteadiness on feet  Rationale for Evaluation and Treatment: Rehabilitation  SUBJECTIVE:                                                                                                                                                                                             SUBJECTIVE STATEMENT:   Pt reports doing well today. Pt denies any recent falls/stumbles since prior session. Pt denies any updates to medications or medical appointment since prior session. Pt reports good compliance with HEP when time permits.    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 Pt reports gallbladder attack July 14th 2024, went to ED, reports they found a stone blockage. Pt then transferred to [facility] where ERCP was performed to remove stones of common bile duct. This was complicated by pancreatitis, heart failure and new onset of a-fib. He reports MRI taken at time where they found cancer in his liver. Pt has undergone 8 sessions of chemo, he also has a cholecystostomy tube that rests on his R side and down RLE. Pt reports future plans for procedure to cut off blood supply to R side of his liver and to remove gallbladder. Pt also with recent R portal vein, R hepatic vein embolization 12/04/23... 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 PMH significant for CA, mets to liver, HF, hx of 2 knee replacements (years ago), a-fib, AAA, basal cell carcinoma, chronic kidney disease, diverticulitis, HTN, melanoma, OA, sleep apnea, vitamin b12 deficiency, chemo 07/2023, 10/2023 cholecystostomy tube placement, 12/04/2023 R portal vein, R hepatic vein emobolization, please refer to chart for full details.   PAIN:  Are you having pain? No pain today.   PRECAUTIONS:  None at the moment;   WEIGHT BEARING RESTRICTIONS: No  FALLS: Has patient fallen in last 6 months? No  LIVING ENVIRONMENT: via chart (from previous outpatient PT course) Lives with: lives with their spouse and has a dog Lives in: House/apartment Stairs: 2 external steps with handrail on L going up Has following equipment at home: shower chair  PLOF: Reports did need some assistance with ADLs until drain recently removed  PATIENT GOALS:  Pt wants to get stronger, get rid of some excess fluid.  OBJECTIVE:  Note: Objective measures were completed at Evaluation unless otherwise noted.                                                                                                                           TREATMENT DATE: 06/16/24 TA- To improve functional movements patterns for everyday tasks   Gait with 3# AW x 500 ft  Sit to stand x 10 with no UE support   Gait with 3# AW x 500 ft  Sit to stand x 10 with no UE support   Standing at support bar with 3# AW donned: - Calf raises x20 - Straight leg abduction x 10 ea - Calf raises x20 - Straight leg abduction x 10 ea   TE- To improve strength, endurance, mobility, and function of specific targeted muscle groups or improve joint range of motion or improve muscle flexibility  Seated LAQ 3# AW 2 x 10 with 2 second hold   TA- To improve functional movements patterns for everyday tasks   Step up 2 x 10 ea LE with UE support for balance   Standing step tap  2 x 10 ea LE no UE assist  Pt required occasional rest breaks due fatigue, PT was attentive to when pt appeared to be tired or winded in order to prevent excessive fatigue.   PATIENT EDUCATION:  Education details:exercise technique Pt educated throughout session about proper posture and technique with exercises. Improved exercise technique, movement at target joints, use of target muscles after min to mod verbal, visual, tactile cues. HEP update  Person educated: Patient Education method: Explanation, Demonstration, and Verbal cues, printout Education comprehension: verbalized understanding and returned demonstration  HOME EXERCISE PROGRAM: Access Code: [email or web address] URL: [email or web address] Date: 04/30/2024 Prepared by: Darryle Patten  Exercises - Mini Squat with Counter Support  - 1 x daily - 3 x weekly - 2 sets - 10 reps - Lunge with Counter Support  - 1 x daily - 3 x weekly - 2 sets - 10 reps  Access  Code: [email or web address] URL: [email or web address] Date: 04/28/2024 Prepared by: Darryle Patten  Exercises - Standing March with Counter Support  - 1-2 x daily - 3-5 x weekly - 2 sets - 10 reps - Standing Hip Abduction with Counter Support  - 1-2 x daily - 3-5 x weekly - 2 sets - 10 reps  GOALS: Goals reviewed with patient? Yes  SHORT TERM GOALS: Target date: 06/09/2024  Patient will be independent in home exercise program to improve strength/mobility for better functional independence with ADLs. Baseline: initiated  Goal status: MET  LONG TERM GOALS: Target date: 07/21/2024  Patient will increase ABC scale score >95% to demonstrate better functional mobility and better confidence with ADLs and return to confidence levels pt achieved with previous bout of therapy Baseline:  today 91.9% (prior to previous PT d/c reached 96%) 7/21: 96%  Goal status: MET  2.  Patient will complete five times sit to stand test in < 10 seconds indicating an increased LE strength and  mm power. Baseline: 12.5 seconds with use of UE (previous PT course pt achieved 10.6 sec) 7/21: 11.66sec no Use of BUE Goal status: INITIAL  3. Patient will increase Functional Gait Assessment score to >27/30 as to reduce fall risk and improve dynamic gait safety with community ambulation  Baseline: 21/30- performed on 04/30/24 Goal status: INITIAL  4.  Patient will increase 10 meter walk test to >1.2 m/s as to improve gait speed for better community ambulation and return to or exceed level of function reached with previous bout of PT. Baseline: today at eval 1.23 m/s (previous PT reached 2.1 m/s on 3/19) 7/21: normal walking speed: 0.47m/s. fast walking speed. 1.68m/s Goal status: MET  5.  Patient will increase six minute walk test distance to >1500 ft to achieve or exceed PLOF and improve functional capacity  Baseline: 1066 ft without AD (in previous bout of PT achieved 1596 ft) 7/21: 119ft no AD with increased back stiffness on this day Goal status: INITIAL  ASSESSMENT:  CLINICAL IMPRESSION:    Patient presents with good motivation for completion of physical therapy activities.  Patient continues with functional strengthening for his lower extremities.  Patient does demonstrate fatigue this day particularly with longer distance ambulation as well as with activities involving a lot of quad muscle activation. Pt will continue to benefit from skilled physical therapy intervention to address impairments, improve QOL, and attain therapy goals.    OBJECTIVE IMPAIRMENTS: Abnormal gait, decreased activity tolerance, decreased balance, decreased endurance, difficulty walking, decreased strength, increased edema, improper body mechanics, postural dysfunction, and pain.   ACTIVITY LIMITATIONS: carrying, lifting, squatting, stairs, transfers, and locomotion level  PARTICIPATION LIMITATIONS: cleaning, shopping, community activity, and yard work  PERSONAL FACTORS: Age, Time since onset of  injury/illness/exacerbation, and 3+ comorbidities: PMH significant for CA, mets to liver, HF, hx of 2 knee replacements (years ago), a-fib, AAA, basal cell carcinoma, chronic kidney disease, diverticulitis, HTN, melanoma, OA, sleep apnea, vitamin b12 deficiency, chemo 07/2023, 10/2023 cholecystostomy tube placement, 12/04/2023 R portal vein, R hepatic vein emobolization, please refer to chart for full details. are also affecting patient's functional outcome.   REHAB POTENTIAL: Good  CLINICAL DECISION MAKING: Evolving/moderate complexity  EVALUATION COMPLEXITY: Moderate  PLAN:  PT FREQUENCY: 1-2x/week  PT DURATION: 12 weeks  PLANNED INTERVENTIONS: 97164- PT Re-evaluation, 97750- Physical Performance Testing, 97110-Therapeutic exercises, 97530- Therapeutic activity, [id number]- Neuromuscular re-education, 97535- Self Care, [id number]- Manual therapy, [id number]- Gait training, [phone]- Orthotic Initial, [id number]- Orthotic/Prosthetic subsequent, [phone]- Canalith repositioning, Patient/Family education, Balance training, Stair  training, Taping, Joint mobilization, Spinal mobilization, Scar mobilization, Vestibular training, DME instructions, Cryotherapy, and Moist heat  PLAN FOR NEXT SESSION:  add to HEP as appropriate, strength, endurance,  progressive dynamic balance training  4:23 PM, 06/16/24  Note: Portions of this document were prepared using Dragon voice recognition software and although reviewed may contain unintentional dictation errors in syntax, grammar, or spelling.  Lonni KATHEE Gainer PT ,DPT Physical Therapist- [place]  [facility]

## 2024-06-18 ENCOUNTER — Ambulatory Visit

## 2024-06-18 ENCOUNTER — Telehealth: Payer: Self-pay

## 2024-06-18 NOTE — Telephone Encounter (Signed)
 PT reached out via secure phone line due to no-show/missed visit today. Per pt's spouse pt confused appointment time/thought today's appointment was later in the afternoon or that it had been changed. Plans to come to next scheduled visit.  Alexander Howe PT, DPT

## 2024-06-20 NOTE — Consults (Signed)
 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 OFF days 15-21 - Administration: Patient's wife, Lebron, stated patient takes 3 tablets (1,500 mg total) by mouth twice daily at 10AM and 6PM with food for 2 weeks on, 1 week off. Pharmacist reviewed with wife to have patient continue taking Xeloda around the same time daily and to separate his doses by about 10-12 hours. He should also continue taking Xeloda with water and food or at least within 30 min of eating. Patient started Xeloda around 05/06/2024. - Proper Handling/Disposal: Wife declined review. - Storage/Excursion: Wife declined review. - Duration of therapy: Per EMR, 6 months (8 cycles) unless POD or intolerable SE  Adherence & Potential Barriers: No barriers to adherence identified. Wife reported no missed doses of Xeloda in the last 4 weeks and she keeps track of patient's doses on a calendar. - Missed dose mgmt: Take a missed dose ASAP. If it is close to the time for your next dose, skip the missed dose and go back to your normal time. If a dose is vomited, continue with the next scheduled dose. Do not take 2 doses at the same time or extra doses to make up for a missed dose.  List Common, Serious SE & Mitigation Reviewed: Wife declined review. List Precautions Reviewed: Increased risk for infection (dec WBCs) - Pharmacist counseled patient's wife on infection control and recommended patient wash hands frequently, avoid large crowds/sick individuals, and stay UTD on his immunizations. Pharmacist also reviewed infection precautions and counseled wife to call provider if patient has fever > 100.4, chills, cough, or sore throat. List Current SE Reviewed (if applicable): Rash (HFS), fatigue - Mitigation/mgmt: Wife stated she started  putting Vani-Cream twice a day on patient and it has helped his itching. She does not want to give him Benadryl  for the itching. Pharmacist reviewed with wife to continue moisturizing patient's hands, feet, and other affected areas 2-3x daily, avoid hot water when washing hands or taking a shower/bath, avoid harsh chemical exposure/laundry detergents with fragrances, and avoid tight-fitting shoes or socks. Pharmacist recommended wife seek immediate care if patient develops additional symptoms with rash as discussed during yesterday's call. For fatigue, patient does PT twice a week and pharmacist encouraged patient continue to stay active (continue PT exercises), take naps PRN and limit to 30 min/nap, don't nap too late in the day, and prioritize work/tasks for times of day with most energy. Wife verbalized understanding.  S/Sx: Patient has fatigue and rash as stated above.  Clinically Relevant, Abnormal Labs: 06/06/2024 . CBC: WBC 3.9 L (slight decrease), RBC 4.00 L (slight increase), HGB 13.2 L (increased), HCT 39.0 L (increased), Plts 90 L . CMP: Carbon dioxide 32.2 H (increased), Urea nitrogen 35 H (stable), Calcium 10.6 H (slight increase) . SCr 1.7 H (slight increase), CrCL 45 mL/min (ABW), eGFR 41 mL/min . LFTs/Tbili: Tbili 1.7 H (slight decrease) . BP/HR: 136/78, 62 (06/13/2024) . Cancer Specific Biomarkers: CA 19-9: <4 (stable) (01/28/2024)  Wellness/Lifestyle Counseling: Pharmacist reviewed infection precautions as stated above. - Immunizations: Avoid live vaccines. Pharmacist recommended patient get the annual flu shot this fall when available (Sep/Oct) and a COVID booster if interested. Pharmacist also recommended patient get a Tdap booster since his last one was completed in 2010. Wife was not interested in patient getting  the Shingrix or RSV vaccines at this time.  Med Rec/Updated drug list: EMR inaccurate, medication changes reviewed with patient's wife: Ferrous sulfate  every other  day Educated Patient on when to report changes or new additions to their medication list: Educated patient's wife DI Check: see below . Cat D: Ferrous sulfate  + MagOx - may have dec absorption of Iron - consider separating doses by as much time as possible if require chronic use of both medications and monitor for dec efficacy of Iron; patient takes Iron in AM and Magnesium  in PM - appropriately separating Cat D: Zolpidem + Alprazolam , Prochlorperazine  - may have enhanced CNS depression - monitor and call clinic if have drowsiness (avoid doing activities that require attention if drowsy), resp depression (slow/difficulty breathing, decreased HR, confusion/memory loss, and irritability); wife verbalized understanding  Common DI & Dietary Avoidance: Patient takes Pantoprazole  currently and pharmacist stated GI Onc team will monitor patient while he is on both Pantoprazole  and Xeloda. Pharmacist encouraged wife call pharmacy if patient needs to start additional medication for heartburn/GERD in the future and patient should avoid taking antacids within 2 hrs of Xeloda. He should also avoid taking ASA-containing products unless specifically directed by provider. Wife was encouraged to call pharmacy if patient starts any new medications (prescription, OTC, herbal supplement) to check for potential DI.  Goals of Therapy: see below . Promote or maintain optimal therapy administration and adherence - no reported missed doses, counseled to separate doses by about 10-12 hours (currently separating by 8 hours) - continue to monitor . Mitigation of side effects - rash (HFS), fatigue; pharmacist reviewed mitigation strategies as stated above - continue to monitor . Reduce progression of disease (POD) and/or improve patient quality of life - per EMR note on 06/03/2024, patient is stable, 1 missed planned activity - continue to monitor  Patient's wife has agreed/understands to goals of therapy during  education/counseling  Additional: Pharmacist had a pleasant conversation with patient's wife, Lebron, to discuss patient's Xeloda for Intrahepatic cholangiocarcinoma. Pharmacist reviewed dose, administration, and adherence. Wife reported no missed doses in the last 4 weeks and rash (HFS) plus fatigue. Pharmacist reviewed mitigation strategies as stated above. Patient has missed 1 planned activity over the past 4 weeks (family dinner) and he has had no unplanned medical visits (hospitalizations, ER visits) since last assessment in July. Wife reported no changes to patient's allergies or diagnoses. She verbalized understanding and had no further questions/concerns. She was welcoming of future check-ins from the clinical RPh team. Next clinical follow up will be in 3 months, marked as Pending  Previously reviewed during initial outreach on 05/06/2024: Patient has been educated on the difference between the roles of Specialty Pharmacist versus in-clinic/on-site RPh. Described as Clinical Pharmacists are part of the [facility] specialty pharmacy, providing care and education in collaboration with the onsite pharmacist team, who are part of the provider & nurse team for daily engagement & care management.

## 2024-06-23 ENCOUNTER — Ambulatory Visit

## 2024-06-23 DIAGNOSIS — R262 Difficulty in walking, not elsewhere classified: Secondary | ICD-10-CM | POA: Diagnosis not present

## 2024-06-23 DIAGNOSIS — R2681 Unsteadiness on feet: Secondary | ICD-10-CM

## 2024-06-23 DIAGNOSIS — M6281 Muscle weakness (generalized): Secondary | ICD-10-CM

## 2024-06-23 NOTE — Therapy (Signed)
 OUTPATIENT PHYSICAL THERAPY TREATMENT  Patient Name: Alexander Howe MRN: [id number] DOB:[birth year], 78 y.o., male Today's Date: 06/23/2024  PCP: Fernande Ophelia JINNY DOUGLAS, MD  REFERRING PROVIDER: Fernande Ophelia JINNY DOUGLAS, MD   END OF SESSION:  PT End of Session - 06/23/24 1451     Visit Number 15    Number of Visits 25    Date for PT Re-Evaluation 07/21/24    Progress Note Due on Visit 20    PT Start Time 1445    PT Stop Time 1525    PT Time Calculation (min) 40 min    Activity Tolerance Patient tolerated treatment well;No increased pain;Patient limited by fatigue    Behavior During Therapy [facility] for tasks assessed/performed           Past Medical History:  Diagnosis Date   AAA (abdominal aortic aneurysm) without rupture (HCC)    Abdominal aneurysm (HCC)    3.5 cm   Actinic keratosis 08/26/2020   right lateral chest   Anxiety    Atrial fibrillation (HCC) 05/30/2023   Basal cell carcinoma 09/18/2018   left upper back/one excised, one superficial   Basal cell carcinoma 11/28/2018   left post shoulder   Basal cell carcinoma 08/26/2020   left medial pretibial, EDC 10/2020   Basal cell carcinoma 03/15/2022   right neck, EDC   Basal cell carcinoma (BCC) 08/31/2022   Posterior base of neck superficial. 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   right lateral chest, EDC 12/09/20   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(HCC)    Venous stasis    Venous stasis    Vitamin B 12 deficiency    Past Surgical History:  Procedure Laterality Date   BIOPSY  05/30/2023   Procedure: BIOPSY;  Surgeon: Avram Lupita BRAVO, MD;   Location: [facility] ENDOSCOPY;  Service: Gastroenterology;;   COLONOSCOPY W/ POLYPECTOMY     COLONOSCOPY WITH PROPOFOL  N/A 04/05/2018   Procedure: COLONOSCOPY WITH PROPOFOL ;  Surgeon: Viktoria Lamar DASEN, MD;  Location: [facility] ENDOSCOPY;  Service: Endoscopy;  Laterality: N/A;   COLOSTOMY     COLOSTOMY REVERSAL     CYST REMOVAL LEG     ENDOSCOPIC RETROGRADE CHOLANGIOPANCREATOGRAPHY (ERCP) WITH PROPOFOL  N/A 05/30/2023   Procedure: ENDOSCOPIC RETROGRADE CHOLANGIOPANCREATOGRAPHY (ERCP) WITH PROPOFOL ;  Surgeon: Avram Lupita BRAVO, MD;  Location: [facility] ENDOSCOPY;  Service: Gastroenterology;  Laterality: N/A;   KNEE ARTHROPLASTY Right 12/27/2015   Procedure: COMPUTER ASSISTED TOTAL KNEE ARTHROPLASTY;  Surgeon: Lynwood SHAUNNA Hue, MD;  Location: [facility] ORS;  Service: Orthopedics;  Laterality: Right;   KNEE ARTHROSCOPY Left    LIPOMA EXCISION     right thigh   LITHOTRIPSY     PANCREATIC STENT PLACEMENT  05/30/2023   Procedure: PANCREATIC STENT PLACEMENT;  Surgeon: Avram Lupita BRAVO, MD;  Location: [facility] ENDOSCOPY;  Service: Gastroenterology;;   TOTAL KNEE ARTHROPLASTY Left 10/16/2012   Patient Active Problem List   Diagnosis Date Noted   Protein-calorie malnutrition, severe 06/02/2023   Calculus  of GB and bile duct w acute cholecyst w/o obst 06/02/2023   Acute pancreatitis without infection or necrosis 06/01/2023   Post-ERCP acute pancreatitis 05/31/2023   Choledocholithiasis 05/31/2023   Gastritis and gastroduodenitis 05/30/2023   Atrial fibrillation (HCC) 05/30/2023   Elevated bilirubin 05/29/2023   Generalized abdominal pain 05/29/2023   Cholelithiasis with choledocholithiasis 05/29/2023   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   Elevated LFTs 05/28/2023   OSA on CPAP 05/28/2023   Ascending aortic aneurysm (HCC) 07/26/2020   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   S/P total knee arthroplasty 12/27/2015   Major depressive disorder with single episode, in full remission (HCC) 12/23/2015   Anxiety 11/16/2015    Benign essential hypertension 11/16/2015   Depression 11/16/2015   Hypersomnia with sleep apnea 11/16/2015   Osteoarthritis 11/16/2015   Pure hypercholesterolemia 11/16/2015   Abdominal aortic aneurysm without rupture (HCC) 12/23/2014   Chronic kidney disease, stage 3 unspecified (HCC) 12/17/2014   ONSET DATE: 02/05/24  REFERRING DIAG:  Diagnosis  E44.1 (ICD-10-CM) - Mild protein-calorie malnutrition   THERAPY DIAG:  Difficulty in walking, not elsewhere classified  Muscle weakness (generalized)  Unsteadiness on feet  Rationale for Evaluation and Treatment: Rehabilitation  SUBJECTIVE:                                                                                                                                                                                             SUBJECTIVE STATEMENT:   Pt doing well in general, no major updates today. His energy level is about medium.    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 Pt reports gallbladder attack July 14th 2024, went to ED, reports they found a stone blockage. Pt then transferred to [facility] where ERCP was performed to remove stones of common bile duct. This was complicated by pancreatitis, heart failure and new onset of a-fib. He reports MRI taken at time where they found cancer in his liver. Pt has undergone 8 sessions of chemo, he also has a cholecystostomy tube that rests on his R side and down RLE. Pt reports future plans for procedure to cut off blood supply to R side of his liver and to remove gallbladder. Pt also with recent R portal vein, R hepatic vein embolization 12/04/23... 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 PMH significant for CA, mets to liver, HF, hx of 2 knee replacements (years ago), a-fib, AAA, basal cell carcinoma, chronic  kidney disease, diverticulitis, HTN,  melanoma, OA, sleep apnea, vitamin b12 deficiency, chemo 07/2023, 10/2023 cholecystostomy tube placement, 12/04/2023 R portal vein, R hepatic vein emobolization, please refer to chart for full details.   PAIN:  Are you having pain? No pain today.   PRECAUTIONS:  None at the moment;   WEIGHT BEARING RESTRICTIONS: No  FALLS: Has patient fallen in last 6 months? No  LIVING ENVIRONMENT: via chart (from previous outpatient PT course) Lives with: lives with their spouse and has a dog Lives in: House/apartment Stairs: 2 external steps with handrail on L going up Has following equipment at home: shower chair  PLOF: Reports did need some assistance with ADLs until drain recently removed  PATIENT GOALS:  Pt wants to get stronger, get rid of some excess fluid.   [facility] PT Assessment - 06/23/24 0001       Functional Gait  Assessment   Gait Level Surface Walks 20 ft in less than 5.5 sec, no assistive devices, good speed, no evidence for imbalance, normal gait pattern, deviates no more than 6 in outside of the 12 in walkway width.    Change in Gait Speed Able to smoothly change walking speed without loss of balance or gait deviation. Deviate no more than 6 in outside of the 12 in walkway width.    Gait with Horizontal Head Turns Performs head turns smoothly with no change in gait. Deviates no more than 6 in outside 12 in walkway width    Gait with Vertical Head Turns Performs head turns with no change in gait. Deviates no more than 6 in outside 12 in walkway width.    Gait and Pivot Turn Pivot turns safely within 3 sec and stops quickly with no loss of balance.    Step Over Obstacle Is able to step over 2 stacked shoe boxes taped together (9 in total height) without changing gait speed. No evidence of imbalance.    Gait with Narrow Base of Support Ambulates 4-7 steps.    Gait with Eyes Closed Walks 20 ft, no assistive devices, good speed, no evidence of imbalance,  normal gait pattern, deviates no more than 6 in outside 12 in walkway width. Ambulates 20 ft in less than 7 sec.    Ambulating Backwards Walks 20 ft, no assistive devices, good speed, no evidence for imbalance, normal gait    Steps Alternating feet, must use rail.    Total Score 27           OBJECTIVE:  Note: Objective measures were completed at Evaluation unless otherwise noted.                                                                                                                           TREATMENT DATE: 06/23/24 -AMB overground, no device, 2 laps LL 5 minutes 14 sec , 1-2 LOB without any needed assist  - STS from chair x12, hands free  - Calf raises x20 at support bar - Standing hip abduction x 10  ea c 3lb AW, BUE supported  - Calf raises x20, BUE supported  - high knee marching x20, alternating legs, 3lb AW bilat, BUE supported  - STS from chair x12, hands free  - Calf raises x20 at support bar - Standing hip abduction x 10 ea c 3lb AW, BUE supported  - Calf raises x20, BUE supported  - high knee marching x20, alternating legs, 3lb AW bilat, BUE supported  -repeat FGA assessment: score increased to 27, but still difficult with tandem walking and walking down stairs with alteranting pattern hands free   -2x4 balance beam AMB forward in // bars 8x39ft -6 fwd step up, forward step down x12 hands free    Pt required occasional rest breaks due fatigue, PT was attentive to when pt appeared to be tired or winded in order to prevent excessive fatigue.   PATIENT EDUCATION: Education details:exercise technique Pt educated throughout session about proper posture and technique with exercises. Improved exercise technique, movement at target joints, use of target muscles after min to mod verbal, visual, tactile cues. HEP update Person educated: Patient Education method: Explanation, Demonstration, and Verbal cues, printout Education comprehension: verbalized understanding and  returned demonstration  HOME EXERCISE PROGRAM: Access Code: [email or web address] URL: [email or web address] Date: 04/30/2024 Prepared by: Darryle Patten  Exercises - Mini Squat with Counter Support  - 1 x daily - 3 x weekly - 2 sets - 10 reps - Lunge with Counter Support  - 1 x daily - 3 x weekly - 2 sets - 10 reps  Access Code: [email or web address] URL: [email or web address] Date: 04/28/2024 Prepared by: Darryle Patten  Exercises - Standing March with Counter Support  - 1-2 x daily - 3-5 x weekly - 2 sets - 10 reps - Standing Hip Abduction with Counter Support  - 1-2 x daily - 3-5 x weekly - 2 sets - 10 reps  GOALS: Goals reviewed with patient? Yes  SHORT TERM GOALS: Target date: 06/09/2024  Patient will be independent in home exercise program to improve strength/mobility for better functional independence with ADLs. Baseline: initiated  Goal status: MET  LONG TERM GOALS: Target date: 07/21/2024  Patient will increase ABC scale score >95% to demonstrate better functional mobility and better confidence with ADLs and return to confidence levels pt achieved with previous bout of therapy Baseline:  today 91.9% (prior to previous PT d/c reached 96%) 7/21: 96%  Goal status: MET  2.  Patient will complete five times sit to stand test in < 10 seconds indicating an increased LE strength and mm power. Baseline: 12.5 seconds with use of UE (previous PT course pt achieved 10.6 sec) 7/21: 11.66sec no Use of BUE Goal status: PROGRESSING  3. Patient will increase Functional Gait Assessment score to >27/30 as to reduce fall risk and improve dynamic gait safety with community ambulation  Baseline: 21/30- performed on 04/30/24; 06/23/24: 27 Goal status: INITIAL  4.  Patient will increase 10 meter walk test to >1.2 m/s as to improve gait speed for better community ambulation and return to or exceed level of function reached with previous bout of PT. Baseline: today at eval 1.23 m/s (previous PT  reached 2.1 m/s on 3/19) 7/21: normal walking speed: 0.20m/s. fast walking speed. 1.41m/s Goal status: MET  5.  Patient will increase six minute walk test distance to >1500 ft to achieve or exceed PLOF and improve functional capacity  Baseline: 1066 ft without AD (in previous bout of PT achieved 1596 ft) 7/21: 1140ft no AD with increased back stiffness  on this day Goal status: INITIAL  ASSESSMENT:  CLINICAL IMPRESSION:    Continued with exercises as previously performed, pt takes recovery breaks seated, but in general has medium energy level today. Pt showing slight improvement in strength exercises, but endurance and speed in distance walking remains in plateau. Balance assessment has improved significantly but remains off baseline. Pt remains very much invested in advancing his progress prior to DC. Pt will continue to benefit from skilled physical therapy intervention to address impairments, improve QOL, and attain therapy goals.    OBJECTIVE IMPAIRMENTS: Abnormal gait, decreased activity tolerance, decreased balance, decreased endurance, difficulty walking, decreased strength, increased edema, improper body mechanics, postural dysfunction, and pain.   ACTIVITY LIMITATIONS: carrying, lifting, squatting, stairs, transfers, and locomotion level  PARTICIPATION LIMITATIONS: cleaning, shopping, community activity, and yard work  PERSONAL FACTORS: Age, Time since onset of injury/illness/exacerbation, and 3+ comorbidities: PMH significant for CA, mets to liver, HF, hx of 2 knee replacements (years ago), a-fib, AAA, basal cell carcinoma, chronic kidney disease, diverticulitis, HTN, melanoma, OA, sleep apnea, vitamin b12 deficiency, chemo 07/2023, 10/2023 cholecystostomy tube placement, 12/04/2023 R portal vein, R hepatic vein emobolization, please refer to chart for full details. are also affecting patient's functional outcome.   REHAB POTENTIAL: Good  CLINICAL DECISION MAKING: Evolving/moderate  complexity  EVALUATION COMPLEXITY: Moderate  PLAN:  PT FREQUENCY: 1-2x/week  PT DURATION: 12 weeks  PLANNED INTERVENTIONS: 97164- PT Re-evaluation, 97750- Physical Performance Testing, 97110-Therapeutic exercises, 97530- Therapeutic activity, [id number]- Neuromuscular re-education, 97535- Self Care, [id number]- Manual therapy, [phone]- Gait training, [phone]- Orthotic Initial, [phone]- Orthotic/Prosthetic subsequent, [phone]- Canalith repositioning, Patient/Family education, Balance training, Stair training, Taping, Joint mobilization, Spinal mobilization, Scar mobilization, Vestibular training, DME instructions, Cryotherapy, and Moist heat  PLAN FOR NEXT SESSION:  add to HEP as appropriate, strength, endurance,  progressive dynamic balance training  2:54 PM, 06/23/24  Note: Portions of this document were prepared using Dragon voice recognition software and although reviewed may contain unintentional dictation errors in syntax, grammar, or spelling.  3:28 PM, 06/23/24 Peggye JAYSON Linear, PT, DPT Physical Therapist - [place] [facility]  Outpatient Physical Therapy- Main Campus [phone]

## 2024-06-25 ENCOUNTER — Ambulatory Visit

## 2024-06-25 DIAGNOSIS — R262 Difficulty in walking, not elsewhere classified: Secondary | ICD-10-CM | POA: Diagnosis not present

## 2024-06-25 DIAGNOSIS — M6281 Muscle weakness (generalized): Secondary | ICD-10-CM

## 2024-06-25 DIAGNOSIS — R2681 Unsteadiness on feet: Secondary | ICD-10-CM

## 2024-06-25 NOTE — Therapy (Signed)
 OUTPATIENT PHYSICAL THERAPY TREATMENT  Patient Name: Alexander Howe MRN: [id number] DOB:13-Feb-1946, 78 y.o., male Today's Date: 06/25/2024  PCP: Fernande Ophelia JINNY DOUGLAS, MD  REFERRING PROVIDER: Fernande Ophelia JINNY DOUGLAS, MD   END OF SESSION:  PT End of Session - 06/25/24 1409     Visit Number 16    Number of Visits 25    Date for PT Re-Evaluation 07/21/24    Progress Note Due on Visit 20    PT Start Time 1400    PT Stop Time 1440    PT Time Calculation (min) 40 min    Equipment Utilized During Treatment Gait belt    Activity Tolerance Patient tolerated treatment well;No increased pain;Patient limited by fatigue    Behavior During Therapy [facility] for tasks assessed/performed           Past Medical History:  Diagnosis Date   AAA (abdominal aortic aneurysm) without rupture (HCC)    Abdominal aneurysm (HCC)    3.5 cm   Actinic keratosis 08/26/2020   right lateral chest   Anxiety    Atrial fibrillation (HCC) 05/30/2023   Basal cell carcinoma 09/18/2018   left upper back/one excised, one superficial   Basal cell carcinoma 11/28/2018   left post shoulder   Basal cell carcinoma 08/26/2020   left medial pretibial, EDC 10/2020   Basal cell carcinoma 03/15/2022   right neck, EDC   Basal cell carcinoma (BCC) 08/31/2022   Posterior base of neck superficial. 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   right lateral chest, EDC 12/09/20   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(HCC)    Venous stasis    Venous stasis    Vitamin B 12 deficiency    Past Surgical History:  Procedure Laterality Date   BIOPSY  05/30/2023    Procedure: BIOPSY;  Surgeon: Avram Lupita BRAVO, MD;  Location: [facility] ENDOSCOPY;  Service: Gastroenterology;;   COLONOSCOPY W/ POLYPECTOMY     COLONOSCOPY WITH PROPOFOL  N/A 04/05/2018   Procedure: COLONOSCOPY WITH PROPOFOL ;  Surgeon: Viktoria Lamar DASEN, MD;  Location: [facility] ENDOSCOPY;  Service: Endoscopy;  Laterality: N/A;   COLOSTOMY     COLOSTOMY REVERSAL     CYST REMOVAL LEG     ENDOSCOPIC RETROGRADE CHOLANGIOPANCREATOGRAPHY (ERCP) WITH PROPOFOL  N/A 05/30/2023   Procedure: ENDOSCOPIC RETROGRADE CHOLANGIOPANCREATOGRAPHY (ERCP) WITH PROPOFOL ;  Surgeon: Avram Lupita BRAVO, MD;  Location: [facility] ENDOSCOPY;  Service: Gastroenterology;  Laterality: N/A;   KNEE ARTHROPLASTY Right 12/27/2015   Procedure: COMPUTER ASSISTED TOTAL KNEE ARTHROPLASTY;  Surgeon: Lynwood SHAUNNA Hue, MD;  Location: [facility] ORS;  Service: Orthopedics;  Laterality: Right;   KNEE ARTHROSCOPY Left    LIPOMA EXCISION     right thigh   LITHOTRIPSY     PANCREATIC STENT PLACEMENT  05/30/2023   Procedure: PANCREATIC STENT PLACEMENT;  Surgeon: Avram Lupita BRAVO, MD;  Location: [facility] ENDOSCOPY;  Service: Gastroenterology;;   TOTAL KNEE ARTHROPLASTY Left 10/16/2012   Patient Active Problem List   Diagnosis Date Noted  Protein-calorie malnutrition, severe 06/02/2023   Calculus of GB and bile duct w acute cholecyst w/o obst 06/02/2023   Acute pancreatitis without infection or necrosis 06/01/2023   Post-ERCP acute pancreatitis 05/31/2023   Choledocholithiasis 05/31/2023   Gastritis and gastroduodenitis 05/30/2023   Atrial fibrillation (HCC) 05/30/2023   Elevated bilirubin 05/29/2023   Generalized abdominal pain 05/29/2023   Cholelithiasis with choledocholithiasis 05/29/2023   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   Elevated LFTs 05/28/2023   OSA on CPAP 05/28/2023   Ascending aortic aneurysm (HCC) 07/26/2020   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   S/P total knee arthroplasty 12/27/2015   Major depressive disorder with single episode, in full  remission (HCC) 12/23/2015   Anxiety 11/16/2015   Benign essential hypertension 11/16/2015   Depression 11/16/2015   Hypersomnia with sleep apnea 11/16/2015   Osteoarthritis 11/16/2015   Pure hypercholesterolemia 11/16/2015   Abdominal aortic aneurysm without rupture (HCC) 12/23/2014   Chronic kidney disease, stage 3 unspecified (HCC) 12/17/2014   ONSET DATE: 02/05/24  REFERRING DIAG:  Diagnosis  E44.1 (ICD-10-CM) - Mild protein-calorie malnutrition   THERAPY DIAG:  Difficulty in walking, not elsewhere classified  Muscle weakness (generalized)  Unsteadiness on feet  Rationale for Evaluation and Treatment: Rehabilitation  SUBJECTIVE:                                                                                                                                                                                             SUBJECTIVE STATEMENT:   Pt doing well in general, no major updates today. His energy level is about medium.    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 Pt reports gallbladder attack July 14th 2024, went to ED, reports they found a stone blockage. Pt then transferred to [facility] where ERCP was performed to remove stones of common bile duct. This was complicated by pancreatitis, heart failure and new onset of a-fib. He reports MRI taken at time where they found cancer in his liver. Pt has undergone 8 sessions of chemo, he also has a cholecystostomy tube that rests on his R side and down RLE. Pt reports future plans for procedure to cut off blood supply to R side of his liver and to remove gallbladder. Pt also with recent R portal vein, R hepatic vein embolization 12/04/23... 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 PMH significant for CA, mets to liver, HF, hx of 2 knee replacements (years  ago), a-fib, AAA, basal  cell carcinoma, chronic kidney disease, diverticulitis, HTN, melanoma, OA, sleep apnea, vitamin b12 deficiency, chemo 07/2023, 10/2023 cholecystostomy tube placement, 12/04/2023 R portal vein, R hepatic vein emobolization, please refer to chart for full details.   PAIN:  Are you having pain? No pain today.   PRECAUTIONS:  None at the moment;   WEIGHT BEARING RESTRICTIONS: No  FALLS: Has patient fallen in last 6 months? No  LIVING ENVIRONMENT: via chart (from previous outpatient PT course) Lives with: lives with their spouse and has a dog Lives in: House/apartment Stairs: 2 external steps with handrail on L going up Has following equipment at home: shower chair  PLOF: Reports did need some assistance with ADLs until drain recently removed  PATIENT GOALS:  Pt wants to get stronger, get rid of some excess fluid.  OBJECTIVE:                                                                                                                            TREATMENT DATE: 06/25/24 -AMB overground, no device, 2 laps LL 5 minutes 35 sec (1 LOB upon return to chair at end, no assist needed)  A bit slower than last 2 times, a little less chatty - STS from chair x12, hands free  - STS from elevation, feet on airex x10  - airex stance chest press with weight bar x10, then hor head turns x10 bilat, then overhead lookin x10  - lateral stepping agility course side stepping 4 step, 1/2 roll, foam square  - lateral stepping agility course side stepping 4 step, 1/2 roll, foam square and low rocker board  - forward/backward stepping agility course side stepping 4 step, 1/2 roll, foam square and low rocker board  *minGuard Assist provided for these -diagonal cable resisted trunk twist: 12.5lb chop 1x10 bilat, then 7.5lb golf swing x8Rt then fatigued and out of energy.    PATIENT EDUCATION: Education details:exercise technique Pt educated throughout session about proper posture and  technique with exercises. Improved exercise technique, movement at target joints, use of target muscles after min to mod verbal, visual, tactile cues. HEP update Person educated: Patient Education method: Explanation, Demonstration, and Verbal cues, printout Education comprehension: verbalized understanding and returned demonstration  HOME EXERCISE PROGRAM: Access Code: [email or web address] URL: [email or web address] Date: 04/30/2024 Prepared by: Darryle Patten  Exercises - Mini Squat with Counter Support  - 1 x daily - 3 x weekly - 2 sets - 10 reps - Lunge with Counter Support  - 1 x daily - 3 x weekly - 2 sets - 10 reps  Access Code: [email or web address] URL: [email or web address] Date: 04/28/2024 Prepared by: Darryle Patten  Exercises - Standing March with Counter Support  - 1-2 x daily - 3-5 x weekly - 2 sets - 10 reps - Standing Hip Abduction with Counter Support  - 1-2 x daily - 3-5 x weekly - 2 sets - 10 reps  GOALS: Goals reviewed with patient? Yes  SHORT TERM GOALS: Target date: 06/09/2024  Patient will be independent in home exercise program to improve strength/mobility for better functional independence with ADLs. Baseline: initiated  Goal status: MET  LONG TERM GOALS: Target date: 07/21/2024  Patient will increase ABC scale score >95% to demonstrate better functional mobility and better confidence with ADLs and return to confidence levels pt achieved with previous bout of therapy Baseline:  today 91.9% (prior to previous PT d/c reached 96%) 7/21: 96%  Goal status: MET  2.  Patient will complete five times sit to stand test in < 10 seconds indicating an increased LE strength and mm power. Baseline: 12.5 seconds with use of UE (previous PT course pt achieved 10.6 sec) 7/21: 11.66sec no Use of BUE Goal status: PROGRESSING  3. Patient will increase Functional Gait Assessment score to >27/30 as to reduce fall risk and improve dynamic gait safety with community  ambulation  Baseline: 21/30- performed on 04/30/24; 06/23/24: 27 Goal status: INITIAL  4.  Patient will increase 10 meter walk test to >1.2 m/s as to improve gait speed for better community ambulation and return to or exceed level of function reached with previous bout of PT. Baseline: today at eval 1.23 m/s (previous PT reached 2.1 m/s on 3/19) 7/21: normal walking speed: 0.21m/s. fast walking speed. 1.28m/s Goal status: MET  5.  Patient will increase six minute walk test distance to >1500 ft to achieve or exceed PLOF and improve functional capacity  Baseline: 1066 ft without AD (in previous bout of PT achieved 1596 ft) 7/21: 1158ft no AD with increased back stiffness on this day Goal status: INITIAL  ASSESSMENT:  CLINICAL IMPRESSION:   Pt more fatigued today, balance a bit off. We focus more on balance activities today which go very well. Pt remains very much invested in advancing his progress prior to DC. Pt will continue to benefit from skilled physical therapy intervention to address impairments, improve QOL, and attain therapy goals.    OBJECTIVE IMPAIRMENTS: Abnormal gait, decreased activity tolerance, decreased balance, decreased endurance, difficulty walking, decreased strength, increased edema, improper body mechanics, postural dysfunction, and pain.   ACTIVITY LIMITATIONS: carrying, lifting, squatting, stairs, transfers, and locomotion level  PARTICIPATION LIMITATIONS: cleaning, shopping, community activity, and yard work  PERSONAL FACTORS: Age, Time since onset of injury/illness/exacerbation, and 3+ comorbidities: PMH significant for CA, mets to liver, HF, hx of 2 knee replacements (years ago), a-fib, AAA, basal cell carcinoma, chronic kidney disease, diverticulitis, HTN, melanoma, OA, sleep apnea, vitamin b12 deficiency, chemo 07/2023, 10/2023 cholecystostomy tube placement, 12/04/2023 R portal vein, R hepatic vein emobolization, please refer to chart for full details. are also  affecting patient's functional outcome.   REHAB POTENTIAL: Good  CLINICAL DECISION MAKING: Evolving/moderate complexity  EVALUATION COMPLEXITY: Moderate  PLAN:  PT FREQUENCY: 1-2x/week  PT DURATION: 12 weeks  PLANNED INTERVENTIONS: 97164- PT Re-evaluation, 97750- Physical Performance Testing, 97110-Therapeutic exercises, 97530- Therapeutic activity, 97112- Neuromuscular re-education, 97535- Self Care, [id number]- Manual therapy, [phone]- Gait training, [phone]- Orthotic Initial, [phone]- Orthotic/Prosthetic subsequent, [phone]- Canalith repositioning, Patient/Family education, Balance training, Stair training, Taping, Joint mobilization, Spinal mobilization, Scar mobilization, Vestibular training, DME instructions, Cryotherapy, and Moist heat  PLAN FOR NEXT SESSION:  add to HEP as appropriate, strength, endurance,  progressive dynamic balance training  2:10 PM, 06/25/24 Peggye JAYSON Linear, PT, DPT Physical Therapist - [facility]  Outpatient Physical Therapy- Main Campus [phone]

## 2024-06-30 ENCOUNTER — Ambulatory Visit

## 2024-06-30 DIAGNOSIS — R262 Difficulty in walking, not elsewhere classified: Secondary | ICD-10-CM

## 2024-06-30 DIAGNOSIS — R2681 Unsteadiness on feet: Secondary | ICD-10-CM

## 2024-06-30 DIAGNOSIS — M6281 Muscle weakness (generalized): Secondary | ICD-10-CM

## 2024-06-30 NOTE — Therapy (Signed)
 OUTPATIENT PHYSICAL THERAPY TREATMENT  Patient Name: Alexander Howe MRN: [id number] DOB:04/09/1946, 78 y.o., male Today's Date: 06/30/2024  PCP: Fernande Ophelia JINNY DOUGLAS, MD  REFERRING PROVIDER: Fernande Ophelia JINNY DOUGLAS, MD   END OF SESSION:  PT End of Session - 06/30/24 1401     Visit Number 17    Number of Visits 25    Date for PT Re-Evaluation 07/21/24    Progress Note Due on Visit 20    PT Start Time 1400    PT Stop Time 1442    PT Time Calculation (min) 42 min    Equipment Utilized During Treatment Gait belt    Activity Tolerance Patient tolerated treatment well;No increased pain;Patient limited by fatigue    Behavior During Therapy [facility] for tasks assessed/performed           Past Medical History:  Diagnosis Date   AAA (abdominal aortic aneurysm) without rupture (HCC)    Abdominal aneurysm (HCC)    3.5 cm   Actinic keratosis 08/26/2020   right lateral chest   Anxiety    Atrial fibrillation (HCC) 05/30/2023   Basal cell carcinoma 09/18/2018   left upper back/one excised, one superficial   Basal cell carcinoma 11/28/2018   left post shoulder   Basal cell carcinoma 08/26/2020   left medial pretibial, EDC 10/2020   Basal cell carcinoma 03/15/2022   right neck, EDC   Basal cell carcinoma (BCC) 08/31/2022   Posterior base of neck superficial. 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   right lateral chest, EDC 12/09/20   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(HCC)    Venous stasis    Venous stasis    Vitamin B 12 deficiency    Past Surgical History:  Procedure Laterality Date   BIOPSY  05/30/2023    Procedure: BIOPSY;  Surgeon: Avram Lupita BRAVO, MD;  Location: [facility] ENDOSCOPY;  Service: Gastroenterology;;   COLONOSCOPY W/ POLYPECTOMY     COLONOSCOPY WITH PROPOFOL  N/A 04/05/2018   Procedure: COLONOSCOPY WITH PROPOFOL ;  Surgeon: Viktoria Lamar DASEN, MD;  Location: [facility] ENDOSCOPY;  Service: Endoscopy;  Laterality: N/A;   COLOSTOMY     COLOSTOMY REVERSAL     CYST REMOVAL LEG     ENDOSCOPIC RETROGRADE CHOLANGIOPANCREATOGRAPHY (ERCP) WITH PROPOFOL  N/A 05/30/2023   Procedure: ENDOSCOPIC RETROGRADE CHOLANGIOPANCREATOGRAPHY (ERCP) WITH PROPOFOL ;  Surgeon: Avram Lupita BRAVO, MD;  Location: [facility] ENDOSCOPY;  Service: Gastroenterology;  Laterality: N/A;   KNEE ARTHROPLASTY Right 12/27/2015   Procedure: COMPUTER ASSISTED TOTAL KNEE ARTHROPLASTY;  Surgeon: Lynwood SHAUNNA Hue, MD;  Location: [facility] ORS;  Service: Orthopedics;  Laterality: Right;   KNEE ARTHROSCOPY Left    LIPOMA EXCISION     right thigh   LITHOTRIPSY     PANCREATIC STENT PLACEMENT  05/30/2023   Procedure: PANCREATIC STENT PLACEMENT;  Surgeon: Avram Lupita BRAVO, MD;  Location: [facility] ENDOSCOPY;  Service: Gastroenterology;;   TOTAL KNEE ARTHROPLASTY Left 10/16/2012   Patient Active Problem List   Diagnosis Date Noted  Protein-calorie malnutrition, severe 06/02/2023   Calculus of GB and bile duct w acute cholecyst w/o obst 06/02/2023   Acute pancreatitis without infection or necrosis 06/01/2023   Post-ERCP acute pancreatitis 05/31/2023   Choledocholithiasis 05/31/2023   Gastritis and gastroduodenitis 05/30/2023   Atrial fibrillation (HCC) 05/30/2023   Elevated bilirubin 05/29/2023   Generalized abdominal pain 05/29/2023   Cholelithiasis with choledocholithiasis 05/29/2023   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   Elevated LFTs 05/28/2023   OSA on CPAP 05/28/2023   Ascending aortic aneurysm (HCC) 07/26/2020   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   S/P total knee arthroplasty 12/27/2015   Major depressive disorder with single episode, in full  remission (HCC) 12/23/2015   Anxiety 11/16/2015   Benign essential hypertension 11/16/2015   Depression 11/16/2015   Hypersomnia with sleep apnea 11/16/2015   Osteoarthritis 11/16/2015   Pure hypercholesterolemia 11/16/2015   Abdominal aortic aneurysm without rupture (HCC) 12/23/2014   Chronic kidney disease, stage 3 unspecified (HCC) 12/17/2014   ONSET DATE: 02/05/24  REFERRING DIAG:  Diagnosis  E44.1 (ICD-10-CM) - Mild protein-calorie malnutrition   THERAPY DIAG:  Difficulty in walking, not elsewhere classified  Muscle weakness (generalized)  Unsteadiness on feet  Rationale for Evaluation and Treatment: Rehabilitation  SUBJECTIVE:                                                                                                                                                                                             SUBJECTIVE STATEMENT:   Pt reports doing well. Energy levels are good.  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 Pt reports gallbladder attack July 14th 2024, went to ED, reports they found a stone blockage. Pt then transferred to [facility] where ERCP was performed to remove stones of common bile duct. This was complicated by pancreatitis, heart failure and new onset of a-fib. He reports MRI taken at time where they found cancer in his liver. Pt has undergone 8 sessions of chemo, he also has a cholecystostomy tube that rests on his R side and down RLE. Pt reports future plans for procedure to cut off blood supply to R side of his liver and to remove gallbladder. Pt also with recent R portal vein, R hepatic vein embolization 12/04/23... 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 PMH significant for CA, mets to liver, HF, hx of 2 knee replacements (years ago), a-fib, AAA, basal cell carcinoma,  chronic kidney disease, diverticulitis, HTN, melanoma, OA,  sleep apnea, vitamin b12 deficiency, chemo 07/2023, 10/2023 cholecystostomy tube placement, 12/04/2023 R portal vein, R hepatic vein emobolization, please refer to chart for full details.   PAIN:  Are you having pain? No pain today.   PRECAUTIONS:  None at the moment;   WEIGHT BEARING RESTRICTIONS: No  FALLS: Has patient fallen in last 6 months? No  LIVING ENVIRONMENT: via chart (from previous outpatient PT course) Lives with: lives with their spouse and has a dog Lives in: House/apartment Stairs: 2 external steps with handrail on L going up Has following equipment at home: shower chair  PLOF: Reports did need some assistance with ADLs until drain recently removed  PATIENT GOALS:  Pt wants to get stronger, get rid of some excess fluid.  OBJECTIVE:                                                                                                                            TREATMENT DATE: 06/30/24  There.Act: AMB overground, no device, 2 laps LL 5 minutes 40 sec. CGA.  STS holding 3 KG med ball: 1x12, 1x10, SBA.   Standing scap retraction at Matrix tower for postural strength, static standing stability using pulley system as perturbation. CGA. 2x15, tandem stance. RLE under BOS and LLE under BOS.  Diagonal cable resisted trunk twist: 12.5lb chop 2x10 bilat    Neuro Re-Ed:  Standing on airex pad, CGA:  Chest press 2x10 Shoulder flexion/press 2x10    PATIENT EDUCATION: Education details:exercise technique Pt educated throughout session about proper posture and technique with exercises. Improved exercise technique, movement at target joints, use of target muscles after min to mod verbal, visual, tactile cues. HEP update Person educated: Patient Education method: Explanation, Demonstration, and Verbal cues, printout Education comprehension: verbalized understanding and returned demonstration  HOME EXERCISE  PROGRAM: Access Code: [email or web address] URL: [email or web address] Date: 04/30/2024 Prepared by: Darryle Patten  Exercises - Mini Squat with Counter Support  - 1 x daily - 3 x weekly - 2 sets - 10 reps - Lunge with Counter Support  - 1 x daily - 3 x weekly - 2 sets - 10 reps  Access Code: [email or web address] URL: [email or web address] Date: 04/28/2024 Prepared by: Darryle Patten  Exercises - Standing March with Counter Support  - 1-2 x daily - 3-5 x weekly - 2 sets - 10 reps - Standing Hip Abduction with Counter Support  - 1-2 x daily - 3-5 x weekly - 2 sets - 10 reps  GOALS: Goals reviewed with patient? Yes  SHORT TERM GOALS: Target date: 06/09/2024  Patient will be independent in home exercise program to improve strength/mobility for better functional independence with ADLs. Baseline: initiated  Goal status: MET  LONG TERM GOALS: Target date: 07/21/2024  Patient will increase ABC scale score >95% to demonstrate better functional mobility and better confidence with ADLs and return to confidence levels pt achieved with previous bout of therapy Baseline:  today 91.9% (prior to previous PT d/c reached 96%) 7/21:  96%  Goal status: MET  2.  Patient will complete five times sit to stand test in < 10 seconds indicating an increased LE strength and mm power. Baseline: 12.5 seconds with use of UE (previous PT course pt achieved 10.6 sec) 7/21: 11.66sec no Use of BUE Goal status: PROGRESSING  3. Patient will increase Functional Gait Assessment score to >27/30 as to reduce fall risk and improve dynamic gait safety with community ambulation  Baseline: 21/30- performed on 04/30/24; 06/23/24: 27 Goal status: INITIAL  4.  Patient will increase 10 meter walk test to >1.2 m/s as to improve gait speed for better community ambulation and return to or exceed level of function reached with previous bout of PT. Baseline: today at eval 1.23 m/s (previous PT reached 2.1 m/s on 3/19) 7/21: normal  walking speed: 0.51m/s. fast walking speed. 1.58m/s Goal status: MET  5.  Patient will increase six minute walk test distance to >1500 ft to achieve or exceed PLOF and improve functional capacity  Baseline: 1066 ft without AD (in previous bout of PT achieved 1596 ft) 7/21: 1141ft no AD with increased back stiffness on this day Goal status: INITIAL  ASSESSMENT:  CLINICAL IMPRESSION:   Continuing PT POC working on LE strength/ endurance and static/dynamic stability. Pt has made steady progress in reducing need for seated rest breaks and has been able to maintain mostly standing rest breaks through majority of exercises indicating improved overall endurance. Pt will continue to benefit from skilled physical therapy intervention to address impairments, improve QOL, and attain therapy goals.   OBJECTIVE IMPAIRMENTS: Abnormal gait, decreased activity tolerance, decreased balance, decreased endurance, difficulty walking, decreased strength, increased edema, improper body mechanics, postural dysfunction, and pain.   ACTIVITY LIMITATIONS: carrying, lifting, squatting, stairs, transfers, and locomotion level  PARTICIPATION LIMITATIONS: cleaning, shopping, community activity, and yard work  PERSONAL FACTORS: Age, Time since onset of injury/illness/exacerbation, and 3+ comorbidities: PMH significant for CA, mets to liver, HF, hx of 2 knee replacements (years ago), a-fib, AAA, basal cell carcinoma, chronic kidney disease, diverticulitis, HTN, melanoma, OA, sleep apnea, vitamin b12 deficiency, chemo 07/2023, 10/2023 cholecystostomy tube placement, 12/04/2023 R portal vein, R hepatic vein emobolization, please refer to chart for full details. are also affecting patient's functional outcome.   REHAB POTENTIAL: Good  CLINICAL DECISION MAKING: Evolving/moderate complexity  EVALUATION COMPLEXITY: Moderate  PLAN:  PT FREQUENCY: 1-2x/week  PT DURATION: 12 weeks  PLANNED INTERVENTIONS: [id number]- PT  Re-evaluation, 97750- Physical Performance Testing, 97110-Therapeutic exercises, 97530- Therapeutic activity, [id number]- Neuromuscular re-education, 97535- Self Care, [id number]- Manual therapy, [id number]- Gait training, [phone]- Orthotic Initial, [phone]- Orthotic/Prosthetic subsequent, [phone]- Canalith repositioning, Patient/Family education, Balance training, Stair training, Taping, Joint mobilization, Spinal mobilization, Scar mobilization, Vestibular training, DME instructions, Cryotherapy, and Moist heat  PLAN FOR NEXT SESSION:  add to HEP as appropriate, strength, endurance,  progressive dynamic balance training   Dorina HERO. Fairly IV, PT, DPT Physical Therapist- [place]  [facility] 3:12 PM, 06/30/24

## 2024-07-02 ENCOUNTER — Ambulatory Visit

## 2024-07-02 DIAGNOSIS — R262 Difficulty in walking, not elsewhere classified: Secondary | ICD-10-CM | POA: Diagnosis not present

## 2024-07-02 DIAGNOSIS — R2681 Unsteadiness on feet: Secondary | ICD-10-CM

## 2024-07-02 DIAGNOSIS — M6281 Muscle weakness (generalized): Secondary | ICD-10-CM

## 2024-07-02 NOTE — Therapy (Signed)
 OUTPATIENT PHYSICAL THERAPY TREATMENT  Patient Name: Alexander Howe MRN: [id number] DOB:February 14, 1946, 78 y.o., male Today's Date: 07/02/2024  PCP: Fernande Ophelia JINNY DOUGLAS, MD  REFERRING PROVIDER: Fernande Ophelia JINNY DOUGLAS, MD   END OF SESSION:  PT End of Session - 07/02/24 1406     Visit Number 18    Number of Visits 25    Date for PT Re-Evaluation 07/21/24    Progress Note Due on Visit 20    PT Start Time 1405    PT Stop Time 1445    PT Time Calculation (min) 40 min    Equipment Utilized During Treatment Gait belt    Activity Tolerance Patient tolerated treatment well;No increased pain;Patient limited by fatigue    Behavior During Therapy [facility] for tasks assessed/performed           Past Medical History:  Diagnosis Date   AAA (abdominal aortic aneurysm) without rupture (HCC)    Abdominal aneurysm (HCC)    3.5 cm   Actinic keratosis 08/26/2020   right lateral chest   Anxiety    Atrial fibrillation (HCC) 05/30/2023   Basal cell carcinoma 09/18/2018   left upper back/one excised, one superficial   Basal cell carcinoma 11/28/2018   left post shoulder   Basal cell carcinoma 08/26/2020   left medial pretibial, EDC 10/2020   Basal cell carcinoma 03/15/2022   right neck, EDC   Basal cell carcinoma (BCC) 08/31/2022   Posterior base of neck superficial. 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   right lateral chest, EDC 12/09/20   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(HCC)    Venous stasis    Venous stasis    Vitamin B 12 deficiency    Past Surgical History:  Procedure Laterality Date   BIOPSY  05/30/2023    Procedure: BIOPSY;  Surgeon: Avram Lupita BRAVO, MD;  Location: [facility] ENDOSCOPY;  Service: Gastroenterology;;   COLONOSCOPY W/ POLYPECTOMY     COLONOSCOPY WITH PROPOFOL  N/A 04/05/2018   Procedure: COLONOSCOPY WITH PROPOFOL ;  Surgeon: Viktoria Lamar DASEN, MD;  Location: [facility] ENDOSCOPY;  Service: Endoscopy;  Laterality: N/A;   COLOSTOMY     COLOSTOMY REVERSAL     CYST REMOVAL LEG     ENDOSCOPIC RETROGRADE CHOLANGIOPANCREATOGRAPHY (ERCP) WITH PROPOFOL  N/A 05/30/2023   Procedure: ENDOSCOPIC RETROGRADE CHOLANGIOPANCREATOGRAPHY (ERCP) WITH PROPOFOL ;  Surgeon: Avram Lupita BRAVO, MD;  Location: [facility] ENDOSCOPY;  Service: Gastroenterology;  Laterality: N/A;   KNEE ARTHROPLASTY Right 12/27/2015   Procedure: COMPUTER ASSISTED TOTAL KNEE ARTHROPLASTY;  Surgeon: Lynwood SHAUNNA Hue, MD;  Location: [facility] ORS;  Service: Orthopedics;  Laterality: Right;   KNEE ARTHROSCOPY Left    LIPOMA EXCISION     right thigh   LITHOTRIPSY     PANCREATIC STENT PLACEMENT  05/30/2023   Procedure: PANCREATIC STENT PLACEMENT;  Surgeon: Avram Lupita BRAVO, MD;  Location: [facility] ENDOSCOPY;  Service: Gastroenterology;;   TOTAL KNEE ARTHROPLASTY Left 10/16/2012   Patient Active Problem List   Diagnosis Date Noted  Protein-calorie malnutrition, severe 06/02/2023   Calculus of GB and bile duct w acute cholecyst w/o obst 06/02/2023   Acute pancreatitis without infection or necrosis 06/01/2023   Post-ERCP acute pancreatitis 05/31/2023   Choledocholithiasis 05/31/2023   Gastritis and gastroduodenitis 05/30/2023   Atrial fibrillation (HCC) 05/30/2023   Elevated bilirubin 05/29/2023   Generalized abdominal pain 05/29/2023   Cholelithiasis with choledocholithiasis 05/29/2023   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   Elevated LFTs 05/28/2023   OSA on CPAP 05/28/2023   Ascending aortic aneurysm (HCC) 07/26/2020   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   S/P total knee arthroplasty 12/27/2015   Major depressive disorder with single episode, in full  remission (HCC) 12/23/2015   Anxiety 11/16/2015   Benign essential hypertension 11/16/2015   Depression 11/16/2015   Hypersomnia with sleep apnea 11/16/2015   Osteoarthritis 11/16/2015   Pure hypercholesterolemia 11/16/2015   Abdominal aortic aneurysm without rupture (HCC) 12/23/2014   Chronic kidney disease, stage 3 unspecified (HCC) 12/17/2014   ONSET DATE: 02/05/24  REFERRING DIAG:  Diagnosis  E44.1 (ICD-10-CM) - Mild protein-calorie malnutrition   THERAPY DIAG:  Difficulty in walking, not elsewhere classified  Muscle weakness (generalized)  Unsteadiness on feet  Rationale for Evaluation and Treatment: Rehabilitation  SUBJECTIVE:                                                                                                                                                                                             SUBJECTIVE STATEMENT:   Pt reports doing well. Was very tired after last session but denies soreness. Continues to feel well and recovered. States he has to cx next appointment due to conflicting medical appointment.  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 Pt reports gallbladder attack July 14th 2024, went to ED, reports they found a stone blockage. Pt then transferred to [facility] where ERCP was performed to remove stones of common bile duct. This was complicated by pancreatitis, heart failure and new onset of a-fib. He reports MRI taken at time where they found cancer in his liver. Pt has undergone 8 sessions of chemo, he also has a cholecystostomy tube that rests on his R side and down RLE. Pt reports future plans for procedure to cut off blood supply to R side of his liver and to remove gallbladder. Pt also with recent R portal vein, R hepatic vein embolization 12/04/23... 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 PMH significant for CA,  mets to liver, HF, hx of 2 knee replacements (years ago), a-fib, AAA, basal cell carcinoma, chronic kidney disease, diverticulitis, HTN, melanoma, OA, sleep apnea, vitamin b12 deficiency, chemo 07/2023, 10/2023 cholecystostomy tube placement, 12/04/2023 R portal vein, R hepatic vein emobolization, please refer to chart for full details.   PAIN:  Are you having pain? No pain today.   PRECAUTIONS:  None at the moment;   WEIGHT BEARING RESTRICTIONS: No  FALLS: Has patient fallen in last 6 months? No  LIVING ENVIRONMENT: via chart (from previous outpatient PT course) Lives with: lives with their spouse and has a dog Lives in: House/apartment Stairs: 2 external steps with handrail on L going up Has following equipment at home: shower chair  PLOF: Reports did need some assistance with ADLs until drain recently removed  PATIENT GOALS:  Pt wants to get stronger, get rid of some excess fluid.  OBJECTIVE:                                                                                                                            TREATMENT DATE: 07/02/24  There.Act: AMB overground, no device, ambulating form gym to medical arts lobby and back. ~8 minutes. 2# AW's donned to progress walking endurance. Diminished step heights noted. Mod VC's for improving foot clearance in swing phase with fair carryover. SBA.  STS holding 3 KG med ball: 3x8, SBA.   Standing scap retraction at Matrix tower for postural strength, static standing stability using pulley system as perturbation. CGA x20 at 27.5#, normal stance  Diagonal cable resisted trunk twist: 12.5lb chop x14 bilat    Neuro Re-Ed:  Standing on airex pad, CGA:  Chest press 2x10 Shoulder flexion/press 2x10    PATIENT EDUCATION: Education details:exercise technique Pt educated throughout session about proper posture and technique with exercises. Improved exercise technique, movement at  target joints, use of target muscles after min to mod verbal, visual, tactile cues. HEP update Person educated: Patient Education method: Explanation, Demonstration, and Verbal cues, printout Education comprehension: verbalized understanding and returned demonstration  HOME EXERCISE PROGRAM: Access Code: [email or web address] URL: [email or web address] Date: 04/30/2024 Prepared by: Darryle Patten  Exercises - Mini Squat with Counter Support  - 1 x daily - 3 x weekly - 2 sets - 10 reps - Lunge with Counter Support  - 1 x daily - 3 x weekly - 2 sets - 10 reps  Access Code: [email or web address] URL: [email or web address] Date: 04/28/2024 Prepared by: Darryle Patten  Exercises - Standing March with Counter Support  - 1-2 x daily - 3-5 x weekly - 2 sets - 10 reps - Standing Hip Abduction with Counter Support  - 1-2 x daily - 3-5 x weekly - 2 sets - 10 reps  GOALS: Goals reviewed with patient? Yes  SHORT TERM GOALS: Target date: 06/09/2024  Patient will be independent in home exercise program to improve strength/mobility for better functional independence with ADLs. Baseline: initiated  Goal status: MET  LONG TERM GOALS: Target  date: 07/21/2024  Patient will increase ABC scale score >95% to demonstrate better functional mobility and better confidence with ADLs and return to confidence levels pt achieved with previous bout of therapy Baseline:  today 91.9% (prior to previous PT d/c reached 96%) 7/21: 96%  Goal status: MET  2.  Patient will complete five times sit to stand test in < 10 seconds indicating an increased LE strength and mm power. Baseline: 12.5 seconds with use of UE (previous PT course pt achieved 10.6 sec) 7/21: 11.66sec no Use of BUE Goal status: PROGRESSING  3. Patient will increase Functional Gait Assessment score to >27/30 as to reduce fall risk and improve dynamic gait safety with community ambulation  Baseline: 21/30- performed on 04/30/24; 06/23/24: 27 Goal  status: INITIAL  4.  Patient will increase 10 meter walk test to >1.2 m/s as to improve gait speed for better community ambulation and return to or exceed level of function reached with previous bout of PT. Baseline: today at eval 1.23 m/s (previous PT reached 2.1 m/s on 3/19) 7/21: normal walking speed: 0.20m/s. fast walking speed. 1.2m/s Goal status: MET  5.  Patient will increase six minute walk test distance to >1500 ft to achieve or exceed PLOF and improve functional capacity  Baseline: 1066 ft without AD (in previous bout of PT achieved 1596 ft) 7/21: 1124ft no AD with increased back stiffness on this day Goal status: INITIAL  ASSESSMENT:  CLINICAL IMPRESSION:   Continuing PT POC working on LE strength/ endurance and static/dynamic stability. Pt has made steady progress in reducing need for seated rest breaks and has been able to maintain mostly standing rest breaks through majority of exercises indicating improved overall endurance. Able to progress volume/resistance of exercises this date with evident increase in fatigue needing more frequent seated rest breaks. Pt remains highly motivated however throughout session. Pt will continue to benefit from skilled physical therapy intervention to address impairments, improve QOL, and attain therapy goals.    OBJECTIVE IMPAIRMENTS: Abnormal gait, decreased activity tolerance, decreased balance, decreased endurance, difficulty walking, decreased strength, increased edema, improper body mechanics, postural dysfunction, and pain.   ACTIVITY LIMITATIONS: carrying, lifting, squatting, stairs, transfers, and locomotion level  PARTICIPATION LIMITATIONS: cleaning, shopping, community activity, and yard work  PERSONAL FACTORS: Age, Time since onset of injury/illness/exacerbation, and 3+ comorbidities: PMH significant for CA, mets to liver, HF, hx of 2 knee replacements (years ago), a-fib, AAA, basal cell carcinoma, chronic kidney disease,  diverticulitis, HTN, melanoma, OA, sleep apnea, vitamin b12 deficiency, chemo 07/2023, 10/2023 cholecystostomy tube placement, 12/04/2023 R portal vein, R hepatic vein emobolization, please refer to chart for full details. are also affecting patient's functional outcome.   REHAB POTENTIAL: Good  CLINICAL DECISION MAKING: Evolving/moderate complexity  EVALUATION COMPLEXITY: Moderate  PLAN:  PT FREQUENCY: 1-2x/week  PT DURATION: 12 weeks  PLANNED INTERVENTIONS: [id number]- PT Re-evaluation, 97750- Physical Performance Testing, 97110-Therapeutic exercises, 97530- Therapeutic activity, [id number]- Neuromuscular re-education, 97535- Self Care, [id number]- Manual therapy, [id number]- Gait training, [phone]- Orthotic Initial, [phone]- Orthotic/Prosthetic subsequent, [phone]- Canalith repositioning, Patient/Family education, Balance training, Stair training, Taping, Joint mobilization, Spinal mobilization, Scar mobilization, Vestibular training, DME instructions, Cryotherapy, and Moist heat  PLAN FOR NEXT SESSION:  add to HEP as appropriate, strength, endurance,  progressive dynamic balance training   Dorina HERO. Fairly IV, PT, DPT Physical Therapist- [place]  [facility] 3:05 PM, 07/02/24

## 2024-07-07 ENCOUNTER — Ambulatory Visit

## 2024-07-09 ENCOUNTER — Ambulatory Visit

## 2024-07-09 DIAGNOSIS — M6281 Muscle weakness (generalized): Secondary | ICD-10-CM

## 2024-07-09 DIAGNOSIS — R262 Difficulty in walking, not elsewhere classified: Secondary | ICD-10-CM | POA: Diagnosis not present

## 2024-07-09 DIAGNOSIS — R2681 Unsteadiness on feet: Secondary | ICD-10-CM

## 2024-07-09 NOTE — Therapy (Signed)
 OUTPATIENT PHYSICAL THERAPY TREATMENT  Patient Name: Alexander Howe MRN: [id number] DOB:1946/06/23, 78 y.o., male Today's Date: 07/09/2024  PCP: Fernande Ophelia JINNY DOUGLAS, MD  REFERRING PROVIDER: Fernande Ophelia JINNY DOUGLAS, MD   END OF SESSION:  PT End of Session - 07/09/24 1403     Visit Number 19    Number of Visits 25    Date for PT Re-Evaluation 07/21/24    Authorization Type [employer]/ [employer]    Progress Note Due on Visit 20    PT Start Time 1402    PT Stop Time 1442    PT Time Calculation (min) 40 min    Equipment Utilized During Treatment Gait belt    Activity Tolerance Patient tolerated treatment well;No increased pain;Patient limited by fatigue    Behavior During Therapy [facility] for tasks assessed/performed           Past Medical History:  Diagnosis Date   AAA (abdominal aortic aneurysm) without rupture (HCC)    Abdominal aneurysm (HCC)    3.5 cm   Actinic keratosis 08/26/2020   right lateral chest   Anxiety    Atrial fibrillation (HCC) 05/30/2023   Basal cell carcinoma 09/18/2018   left upper back/one excised, one superficial   Basal cell carcinoma 11/28/2018   left post shoulder   Basal cell carcinoma 08/26/2020   left medial pretibial, EDC 10/2020   Basal cell carcinoma 03/15/2022   right neck, EDC   Basal cell carcinoma (BCC) 08/31/2022   Posterior base of neck superficial. 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   right lateral chest, EDC 12/09/20   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(HCC)    Venous stasis    Venous stasis    Vitamin B 12 deficiency    Past Surgical History:  Procedure  Laterality Date   BIOPSY  05/30/2023   Procedure: BIOPSY;  Surgeon: Avram Lupita BRAVO, MD;  Location: [facility] ENDOSCOPY;  Service: Gastroenterology;;   COLONOSCOPY W/ POLYPECTOMY     COLONOSCOPY WITH PROPOFOL  N/A 04/05/2018   Procedure: COLONOSCOPY WITH PROPOFOL ;  Surgeon: Viktoria Lamar DASEN, MD;  Location[facility] ENDOSCOPY;  Service: Endoscopy;  Laterality: N/A;   COLOSTOMY     COLOSTOMY REVERSAL     CYST REMOVAL LEG     ENDOSCOPIC RETROGRADE CHOLANGIOPANCREATOGRAPHY (ERCP) WITH PROPOFOL  N/A 05/30/2023   Procedure: ENDOSCOPIC RETROGRADE CHOLANGIOPANCREATOGRAPHY (ERCP) WITH PROPOFOL ;  Surgeon: Avram Lupita BRAVO, MD;  Location: [facility] ENDOSCOPY;  Service: Gastroenterology;  Laterality: N/A;   KNEE ARTHROPLASTY Right 12/27/2015   Procedure: COMPUTER ASSISTED TOTAL KNEE ARTHROPLASTY;  Surgeon: Lynwood SHAUNNA Hue, MD;  Location: [facility] ORS;  Service: Orthopedics;  Laterality: Right;   KNEE ARTHROSCOPY Left    LIPOMA EXCISION     right thigh   LITHOTRIPSY     PANCREATIC STENT PLACEMENT  05/30/2023   Procedure: PANCREATIC STENT PLACEMENT;  Surgeon: Avram Lupita BRAVO, MD;  Location: [facility] ENDOSCOPY;  Service: Gastroenterology;;   TOTAL KNEE ARTHROPLASTY Left 10/16/2012   Patient Active  Problem List   Diagnosis Date Noted   Protein-calorie malnutrition, severe 06/02/2023   Calculus of GB and bile duct w acute cholecyst w/o obst 06/02/2023   Acute pancreatitis without infection or necrosis 06/01/2023   Post-ERCP acute pancreatitis 05/31/2023   Choledocholithiasis 05/31/2023   Gastritis and gastroduodenitis 05/30/2023   Atrial fibrillation (HCC) 05/30/2023   Elevated bilirubin 05/29/2023   Generalized abdominal pain 05/29/2023   Cholelithiasis with choledocholithiasis 05/29/2023   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   Elevated LFTs 05/28/2023   OSA on CPAP 05/28/2023   Ascending aortic aneurysm (HCC) 07/26/2020   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   S/P total knee arthroplasty 12/27/2015   Major depressive  disorder with single episode, in full remission (HCC) 12/23/2015   Anxiety 11/16/2015   Benign essential hypertension 11/16/2015   Depression 11/16/2015   Hypersomnia with sleep apnea 11/16/2015   Osteoarthritis 11/16/2015   Pure hypercholesterolemia 11/16/2015   Abdominal aortic aneurysm without rupture (HCC) 12/23/2014   Chronic kidney disease, stage 3 unspecified (HCC) 12/17/2014   ONSET DATE: 02/05/24  REFERRING DIAG:  Diagnosis  E44.1 (ICD-10-CM) - Mild protein-calorie malnutrition   THERAPY DIAG:  Difficulty in walking, not elsewhere classified  Muscle weakness (generalized)  Unsteadiness on feet  Rationale for Evaluation and Treatment: Rehabilitation  SUBJECTIVE:                                                                                                                                                                                             SUBJECTIVE STATEMENT:   Pt doing well in general. No falls since last session.   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 Pt reports gallbladder attack July 14th 2024, went to ED, reports they found a stone blockage. Pt then transferred to [facility] where ERCP was performed to remove stones of common bile duct. This was complicated by pancreatitis, heart failure and new onset of a-fib. He reports MRI taken at time where they found cancer in his liver. Pt has undergone 8 sessions of chemo, he also has a cholecystostomy tube that rests on his R side and down RLE. Pt reports future plans for procedure to cut off blood supply to R side of his liver and to remove gallbladder. Pt also with recent R portal vein, R hepatic vein embolization 12/04/23... 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 PMH significant for CA, mets to liver, HF, hx of 2 knee replacements  (years ago),  a-fib, AAA, basal cell carcinoma, chronic kidney disease, diverticulitis, HTN, melanoma, OA, sleep apnea, vitamin b12 deficiency, chemo 07/2023, 10/2023 cholecystostomy tube placement, 12/04/2023 R portal vein, R hepatic vein emobolization, please refer to chart for full details.   PAIN:  Are you having pain? No pain today.   PRECAUTIONS:  None at the moment;   WEIGHT BEARING RESTRICTIONS: No  FALLS: Has patient fallen in last 6 months? No  LIVING ENVIRONMENT: via chart (from previous outpatient PT course) Lives with: lives with their spouse and has a dog Lives in: House/apartment Stairs: 2 external steps with handrail on L going up Has following equipment at home: shower chair  PLOF: Reports did need some assistance with ADLs until drain recently removed  PATIENT GOALS:  Pt wants to get stronger, get rid of some excess fluid.  OBJECTIVE:                                                                                                                       TREATMENT DATE: 07/09/24 -STS from chair + 5kg ball 2x6  -3lb AW for 428ft overground AMB (minGuardA)  -STS from chair + 5kg ball 1x6  -3lb AW for 452ft overground AMB (minGuardA) 2:51sec -lateral stepping balance activity 6 step, foam wedge, airex foam lateral and forward stepping minGuard A   PATIENT EDUCATION: Education details:exercise technique Pt educated throughout session about proper posture and technique with exercises. Improved exercise technique, movement at target joints, use of target muscles after min to mod verbal, visual, tactile cues. HEP update Person educated: Patient Education method: Explanation, Demonstration, and Verbal cues, printout Education comprehension: verbalized understanding and returned demonstration  HOME EXERCISE PROGRAM: Access Code: [email or web address] URL: [email or web address] Date: 04/30/2024 Prepared by: Darryle Patten  Exercises - Mini Squat with Counter Support  - 1  x daily - 3 x weekly - 2 sets - 10 reps - Lunge with Counter Support  - 1 x daily - 3 x weekly - 2 sets - 10 reps  Access Code: [email or web address] URL: [email or web address] Date: 04/28/2024 Prepared by: Darryle Patten  Exercises - Standing March with Counter Support  - 1-2 x daily - 3-5 x weekly - 2 sets - 10 reps - Standing Hip Abduction with Counter Support  - 1-2 x daily - 3-5 x weekly - 2 sets - 10 reps  GOALS: Goals reviewed with patient? Yes  SHORT TERM GOALS: Target date: 06/09/2024  Patient will be independent in home exercise program to improve strength/mobility for better functional independence with ADLs. Baseline: initiated  Goal status: MET  LONG TERM GOALS: Target date: 07/21/2024  Patient will increase ABC scale score >95% to demonstrate better functional mobility and better confidence with ADLs and return to confidence levels pt achieved with previous bout of therapy Baseline:  today 91.9% (prior to previous PT d/c reached 96%) 7/21: 96%  Goal status: MET  2.  Patient will complete five times sit to stand test in < 10 seconds indicating an increased LE strength and mm  power. Baseline: 12.5 seconds with use of UE (previous PT course pt achieved 10.6 sec) 7/21: 11.66sec no Use of BUE; 07/09/24: 8.63 sec (hands free)  Goal status: ACHIEVED  3. Patient will increase Functional Gait Assessment score to >27/30 as to reduce fall risk and improve dynamic gait safety with community ambulation  Baseline: 21/30- performed on 04/30/24; 06/23/24: 27 Goal status: INITIAL  4.  Patient will increase 10 meter walk test to >1.2 m/s as to improve gait speed for better community ambulation and return to or exceed level of function reached with previous bout of PT. Baseline: today at eval 1.23 m/s (previous PT reached 2.1 m/s on 3/19) 7/21: normal walking speed: 0.72m/s. fast walking speed. 1.53m/s Goal status: MET  5.  Patient will increase six minute walk test distance to >1500 ft to  achieve or exceed PLOF and improve functional capacity  Baseline: 1066 ft without AD (in previous bout of PT achieved 1596 ft) 7/21: 1161ft no AD with increased back stiffness on this day Goal status: INITIAL  ASSESSMENT:  CLINICAL IMPRESSION:   Continuing PT POC working on LE strength/endurance and static/dynamic stability. Able to progress volume/resistance of exercises this date with evident increase in fatigue needing more frequent seated rest breaks. Pt shows excellent funcitonal improvement in 5xSTS retest today. Pt remains highly motivated however throughout session. Pt will continue to benefit from skilled physical therapy intervention to address impairments, improve QOL, and attain therapy goals.    OBJECTIVE IMPAIRMENTS: Abnormal gait, decreased activity tolerance, decreased balance, decreased endurance, difficulty walking, decreased strength, increased edema, improper body mechanics, postural dysfunction, and pain.   ACTIVITY LIMITATIONS: carrying, lifting, squatting, stairs, transfers, and locomotion level  PARTICIPATION LIMITATIONS: cleaning, shopping, community activity, and yard work  PERSONAL FACTORS: Age, Time since onset of injury/illness/exacerbation, and 3+ comorbidities: PMH significant for CA, mets to liver, HF, hx of 2 knee replacements (years ago), a-fib, AAA, basal cell carcinoma, chronic kidney disease, diverticulitis, HTN, melanoma, OA, sleep apnea, vitamin b12 deficiency, chemo 07/2023, 10/2023 cholecystostomy tube placement, 12/04/2023 R portal vein, R hepatic vein emobolization, please refer to chart for full details. are also affecting patient's functional outcome.   REHAB POTENTIAL: Good  CLINICAL DECISION MAKING: Evolving/moderate complexity  EVALUATION COMPLEXITY: Moderate  PLAN:  PT FREQUENCY: 1-2x/week  PT DURATION: 12 weeks  PLANNED INTERVENTIONS: 97164- PT Re-evaluation, 97750- Physical Performance Testing, 97110-Therapeutic exercises, 97530-  Therapeutic activity, 97112- Neuromuscular re-education, 97535- Self Care, [id number]- Manual therapy, [phone]- Gait training, [phone]- Orthotic Initial, [phone]- Orthotic/Prosthetic subsequent, [phone]- Canalith repositioning, Patient/Family education, Balance training, Stair training, Taping, Joint mobilization, Spinal mobilization, Scar mobilization, Vestibular training, DME instructions, Cryotherapy, and Moist heat  PLAN FOR NEXT SESSION:  add to HEP as appropriate, strength, endurance,  progressive dynamic balance training   2:09 PM, 07/09/24 Peggye JAYSON Linear, PT, DPT Physical Therapist - [facility]  Outpatient Physical Therapy- Main Campus [phone]

## 2024-07-16 ENCOUNTER — Ambulatory Visit: Attending: Internal Medicine

## 2024-07-16 DIAGNOSIS — R2681 Unsteadiness on feet: Secondary | ICD-10-CM | POA: Insufficient documentation

## 2024-07-16 DIAGNOSIS — R262 Difficulty in walking, not elsewhere classified: Secondary | ICD-10-CM | POA: Diagnosis present

## 2024-07-16 DIAGNOSIS — M6281 Muscle weakness (generalized): Secondary | ICD-10-CM | POA: Insufficient documentation

## 2024-07-16 NOTE — Therapy (Signed)
 OUTPATIENT PHYSICAL THERAPY TREATMENT  Patient Name: Alexander Howe MRN: [id number] DOB:Mar 30, 1946, 78 y.o., male Today's Date: 07/16/2024  PCP: Fernande Ophelia JINNY DOUGLAS, MD  REFERRING PROVIDER: Fernande Ophelia JINNY DOUGLAS, MD   END OF SESSION:  PT End of Session - 07/16/24 1413     Visit Number 20    Number of Visits 25    Date for PT Re-Evaluation 07/21/24    Authorization Type [employer]/ [employer]    Progress Note Due on Visit 20    PT Start Time 1400    PT Stop Time 1440    PT Time Calculation (min) 40 min    Equipment Utilized During Treatment Gait belt    Activity Tolerance Patient tolerated treatment well;No increased pain;Patient limited by fatigue    Behavior During Therapy [facility] for tasks assessed/performed           Past Medical History:  Diagnosis Date   AAA (abdominal aortic aneurysm) without rupture (HCC)    Abdominal aneurysm (HCC)    3.5 cm   Actinic keratosis 08/26/2020   right lateral chest   Anxiety    Atrial fibrillation (HCC) 05/30/2023   Basal cell carcinoma 09/18/2018   left upper back/one excised, one superficial   Basal cell carcinoma 11/28/2018   left post shoulder   Basal cell carcinoma 08/26/2020   left medial pretibial, EDC 10/2020   Basal cell carcinoma 03/15/2022   right neck, EDC   Basal cell carcinoma (BCC) 08/31/2022   Posterior base of neck superficial. 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   right lateral chest, EDC 12/09/20   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(HCC)    Venous stasis    Venous stasis    Vitamin B 12 deficiency    Past Surgical History:  Procedure  Laterality Date   BIOPSY  05/30/2023   Procedure: BIOPSY;  Surgeon: Avram Lupita BRAVO, MD;  Location: [facility] ENDOSCOPY;  Service: Gastroenterology;;   COLONOSCOPY W/ POLYPECTOMY     COLONOSCOPY WITH PROPOFOL  N/A 04/05/2018   Procedure: COLONOSCOPY WITH PROPOFOL ;  Surgeon: Viktoria Lamar DASEN, MD;  Location: [facility] ENDOSCOPY;  Service: Endoscopy;  Laterality: N/A;   COLOSTOMY     COLOSTOMY REVERSAL     CYST REMOVAL LEG     ENDOSCOPIC RETROGRADE CHOLANGIOPANCREATOGRAPHY (ERCP) WITH PROPOFOL  N/A 05/30/2023   Procedure: ENDOSCOPIC RETROGRADE CHOLANGIOPANCREATOGRAPHY (ERCP) WITH PROPOFOL ;  Surgeon: Avram Lupita BRAVO, MD;  Location: [facility] ENDOSCOPY;  Service: Gastroenterology;  Laterality: N/A;   KNEE ARTHROPLASTY Right 12/27/2015   Procedure: COMPUTER ASSISTED TOTAL KNEE ARTHROPLASTY;  Surgeon: Lynwood SHAUNNA Hue, MD;  Location: [facility] ORS;  Service: Orthopedics;  Laterality: Right;   KNEE ARTHROSCOPY Left    LIPOMA EXCISION     right thigh   LITHOTRIPSY     PANCREATIC STENT PLACEMENT  05/30/2023   Procedure: PANCREATIC STENT PLACEMENT;  Surgeon: Avram Lupita BRAVO, MD;  Location: [facility] ENDOSCOPY;  Service: Gastroenterology;;   TOTAL KNEE ARTHROPLASTY Left 10/16/2012   Patient Active  Problem List   Diagnosis Date Noted   Protein-calorie malnutrition, severe 06/02/2023   Calculus of GB and bile duct w acute cholecyst w/o obst 06/02/2023   Acute pancreatitis without infection or necrosis 06/01/2023   Post-ERCP acute pancreatitis 05/31/2023   Choledocholithiasis 05/31/2023   Gastritis and gastroduodenitis 05/30/2023   Atrial fibrillation (HCC) 05/30/2023   Elevated bilirubin 05/29/2023   Generalized abdominal pain 05/29/2023   Cholelithiasis with choledocholithiasis 05/29/2023   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   Elevated LFTs 05/28/2023   OSA on CPAP 05/28/2023   Ascending aortic aneurysm (HCC) 07/26/2020   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   S/P total knee arthroplasty 12/27/2015   Major depressive  disorder with single episode, in full remission (HCC) 12/23/2015   Anxiety 11/16/2015   Benign essential hypertension 11/16/2015   Depression 11/16/2015   Hypersomnia with sleep apnea 11/16/2015   Osteoarthritis 11/16/2015   Pure hypercholesterolemia 11/16/2015   Abdominal aortic aneurysm without rupture (HCC) 12/23/2014   Chronic kidney disease, stage 3 unspecified (HCC) 12/17/2014   ONSET DATE: 02/05/24  REFERRING DIAG:  Diagnosis  E44.1 (ICD-10-CM) - Mild protein-calorie malnutrition   THERAPY DIAG:  Difficulty in walking, not elsewhere classified  Muscle weakness (generalized)  Unsteadiness on feet  Rationale for Evaluation and Treatment: Rehabilitation  SUBJECTIVE:                                                                                                                                                                                             SUBJECTIVE STATEMENT:   Pt doing well in general. Feels like he has made a lot of progress and is ready to DC after his next visit tomorrow. Pt hope to return to gym soon.   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 Pt reports gallbladder attack July 14th 2024, went to ED, reports they found a stone blockage. Pt then transferred to [facility] where ERCP was performed to remove stones of common bile duct. This was complicated by pancreatitis, heart failure and new onset of a-fib. He reports MRI taken at time where they found cancer in his liver. Pt has undergone 8 sessions of chemo, he also has a cholecystostomy tube that rests on his R side and down RLE. Pt reports future plans for procedure to cut off blood supply to R side of his liver and to remove gallbladder. Pt also with recent R portal vein, R hepatic vein embolization 12/04/23... Pt with hx of ascending thoracic aneurysm (4.4  cm) which  is under surveillance. PMH significant for CA, mets to liver, HF, hx of 2 knee replacements (years ago), a-fib, AAA, basal cell carcinoma, chronic kidney disease, diverticulitis, HTN, melanoma, OA, sleep apnea, vitamin b12 deficiency, chemo 07/2023, 10/2023 cholecystostomy tube placement, 12/04/2023 R portal vein, R hepatic vein emobolization, please refer to chart for full details.   PAIN:  Are you having pain? No pain today.   PRECAUTIONS:  None at the moment;   WEIGHT BEARING RESTRICTIONS: No  FALLS: Has patient fallen in last 6 months? No  LIVING ENVIRONMENT: via chart (from previous outpatient PT course) Lives with: lives with their spouse and has a dog Lives in: House/apartment Stairs: 2 external steps with handrail on L going up Has following equipment at home: shower chair  PLOF: Reports did need some assistance with ADLs until drain recently removed  PATIENT GOALS:  Pt wants to get stronger, get rid of some excess fluid.  OBJECTIVE:                                                                                                                       TREATMENT DATE: 07/16/24 -discussion of progress and goals, plan for DC  -plan for final HEP and return to gym  - : 1243ft  -SLS balance practice, alternating sides x3 minutes  -tandem balance practice, alternating sides x3 minutes -lateral stepping with green band x60sec   PATIENT EDUCATION: Education details:exercise technique Pt educated throughout session about proper posture and technique with exercises. Improved exercise technique, movement at target joints, use of target muscles after min to mod verbal, visual, tactile cues. HEP update Person educated: Patient Education method: Explanation, Demonstration, and Verbal cues, printout Education comprehension: verbalized understanding and returned demonstration  HOME EXERCISE PROGRAM: Access Code: [email or web address] URL: [email or web address] Date:  07/16/2024 Prepared by: Peggye Linear  Exercises - Single Leg Stance with Support  - 3 x weekly - 1 sets - 10 reps - 10 hold - Standing Tandem Balance with Counter Support  - 3 x weekly - 1 sets - 10 reps - 10 hold - Side Stepping with Resistance at Thighs and Counter Support  - 1 x daily - 7 x weekly - 3 sets - 10 reps - Walking  - 1-2 x daily - 5 x weekly - 8-52m hold  GOALS: Goals reviewed with patient? Yes  SHORT TERM GOALS: Target date: 06/09/2024  Patient will be independent in home exercise program to improve strength/mobility for better functional independence with ADLs. Baseline: initiated  Goal status: MET  LONG TERM GOALS: Target date: 07/21/2024  Patient will increase ABC scale score >95% to demonstrate better functional mobility and better confidence with ADLs and return to confidence levels pt achieved with previous bout of therapy Baseline:  today 91.9% (prior to previous PT d/c reached 96%) 7/21: 96%  Goal status: MET  2.  Patient will complete five times sit to stand test in < 10 seconds indicating an increased LE strength and mm power. Baseline: 12.5  seconds with use of UE (previous PT course pt achieved 10.6 sec) 7/21: 11.66sec no Use of BUE; 07/09/24: 8.63 sec (hands free)  Goal status: ACHIEVED  3. Patient will increase Functional Gait Assessment score to >27/30 as to reduce fall risk and improve dynamic gait safety with community ambulation  Baseline: 21/30- performed on 04/30/24; 06/23/24: 27 Goal status: PROGRESSIVE  4.  Patient will increase 10 meter walk test to >1.2 m/s as to improve gait speed for better community ambulation and return to or exceed level of function reached with previous bout of PT. Baseline: today at eval 1.23 m/s (previous PT reached 2.1 m/s on 3/19) 7/21: normal walking speed: 0.72m/s. fast walking speed. 1.34m/s Goal status: MET  5.  Patient will increase six minute walk test distance to >1500 ft to achieve or exceed PLOF and improve  functional capacity  Baseline: 1066 ft without AD (in previous bout of PT achieved 1596 ft); 7/21: 1135ft no AD with increased back stiffness on this day; 07/16/24 1273ft Goal status: PROGRESSING  ASSESSMENT:  CLINICAL IMPRESSION:    Completed final objective tests and measures not already completed previous visit. Pt showing excellent progress toward LT goals of care overall.  Pt remains highly motivated however throughout session. Pt will continue to benefit from skilled physical therapy intervention to address impairments, improve QOL, and attain therapy goals.    OBJECTIVE IMPAIRMENTS: Abnormal gait, decreased activity tolerance, decreased balance, decreased endurance, difficulty walking, decreased strength, increased edema, improper body mechanics, postural dysfunction, and pain.   ACTIVITY LIMITATIONS: carrying, lifting, squatting, stairs, transfers, and locomotion level  PARTICIPATION LIMITATIONS: cleaning, shopping, community activity, and yard work  PERSONAL FACTORS: Age, Time since onset of injury/illness/exacerbation, and 3+ comorbidities: PMH significant for CA, mets to liver, HF, hx of 2 knee replacements (years ago), a-fib, AAA, basal cell carcinoma, chronic kidney disease, diverticulitis, HTN, melanoma, OA, sleep apnea, vitamin b12 deficiency, chemo 07/2023, 10/2023 cholecystostomy tube placement, 12/04/2023 R portal vein, R hepatic vein emobolization, please refer to chart for full details. are also affecting patient's functional outcome.   REHAB POTENTIAL: Good  CLINICAL DECISION MAKING: Evolving/moderate complexity  EVALUATION COMPLEXITY: Moderate  PLAN:  PT FREQUENCY: 1-2x/week  PT DURATION: 12 weeks  PLANNED INTERVENTIONS: 97164- PT Re-evaluation, 97750- Physical Performance Testing, 97110-Therapeutic exercises, 97530- Therapeutic activity, 97112- Neuromuscular re-education, 97535- Self Care, 02859- Manual therapy, [phone]- Gait training, [phone]- Orthotic Initial, [phone]-  Orthotic/Prosthetic subsequent, [phone]- Canalith repositioning, Patient/Family education, Balance training, Stair training, Taping, Joint mobilization, Spinal mobilization, Scar mobilization, Vestibular training, DME instructions, Cryotherapy, and Moist heat  PLAN FOR NEXT SESSION:  Review final HEP and DC   2:15 PM, 07/16/24 Peggye JAYSON Linear, PT, DPT Physical Therapist - [facility]  Outpatient Physical Therapy- Main Campus [phone]

## 2024-07-17 ENCOUNTER — Ambulatory Visit

## 2024-07-17 DIAGNOSIS — R262 Difficulty in walking, not elsewhere classified: Secondary | ICD-10-CM | POA: Diagnosis not present

## 2024-07-17 DIAGNOSIS — R2681 Unsteadiness on feet: Secondary | ICD-10-CM

## 2024-07-17 DIAGNOSIS — M6281 Muscle weakness (generalized): Secondary | ICD-10-CM

## 2024-07-17 NOTE — Therapy (Signed)
 OUTPATIENT PHYSICAL THERAPY TREATMENT/DISCHARGE  Patient Name: Alexander Howe MRN: [id number] DOB:[birth year], 78 y.o., male Today's Date: 07/17/2024  PCP: Fernande Ophelia JINNY DOUGLAS, MD  REFERRING PROVIDER: Fernande Ophelia JINNY DOUGLAS, MD   END OF SESSION:  PT End of Session - 07/17/24 1254     Visit Number 21    Number of Visits 25    Date for PT Re-Evaluation 07/21/24    Authorization Type [employer]/ [employer]    Progress Note Due on Visit 30    PT Start Time 1145    PT Stop Time 1215    PT Time Calculation (min) 30 min    Activity Tolerance Patient tolerated treatment well;No increased pain;Patient limited by fatigue    Behavior During Therapy [facility] for tasks assessed/performed           Past Medical History:  Diagnosis Date   AAA (abdominal aortic aneurysm) without rupture (HCC)    Abdominal aneurysm (HCC)    3.5 cm   Actinic keratosis 08/26/2020   right lateral chest   Anxiety    Atrial fibrillation (HCC) 05/30/2023   Basal cell carcinoma 09/18/2018   left upper back/one excised, one superficial   Basal cell carcinoma 11/28/2018   left post shoulder   Basal cell carcinoma 08/26/2020   left medial pretibial, EDC 10/2020   Basal cell carcinoma 03/15/2022   right neck, EDC   Basal cell carcinoma (BCC) 08/31/2022   Posterior base of neck superficial. 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   right lateral chest, EDC 12/09/20   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(HCC)    Venous stasis    Venous stasis    Vitamin B 12 deficiency    Past Surgical History:  Procedure Laterality Date   BIOPSY  05/30/2023    Procedure: BIOPSY;  Surgeon: Avram Lupita BRAVO, MD;  Location: [facility] ENDOSCOPY;  Service: Gastroenterology;;   COLONOSCOPY W/ POLYPECTOMY     COLONOSCOPY WITH PROPOFOL  N/A 04/05/2018   Procedure: COLONOSCOPY WITH PROPOFOL ;  Surgeon: Viktoria Lamar DASEN, MD;  Location: [facility] ENDOSCOPY;  Service: Endoscopy;  Laterality: N/A;   COLOSTOMY     COLOSTOMY REVERSAL     CYST REMOVAL LEG     ENDOSCOPIC RETROGRADE CHOLANGIOPANCREATOGRAPHY (ERCP) WITH PROPOFOL  N/A 05/30/2023   Procedure: ENDOSCOPIC RETROGRADE CHOLANGIOPANCREATOGRAPHY (ERCP) WITH PROPOFOL ;  Surgeon: Avram Lupita BRAVO, MD;  Location: [facility] ENDOSCOPY;  Service: Gastroenterology;  Laterality: N/A;   KNEE ARTHROPLASTY Right 12/27/2015   Procedure: COMPUTER ASSISTED TOTAL KNEE ARTHROPLASTY;  Surgeon: Lynwood SHAUNNA Hue, MD;  Location: [facility] ORS;  Service: Orthopedics;  Laterality: Right;   KNEE ARTHROSCOPY Left    LIPOMA EXCISION     right thigh   LITHOTRIPSY     PANCREATIC STENT PLACEMENT  05/30/2023   Procedure: PANCREATIC STENT PLACEMENT;  Surgeon: Avram Lupita BRAVO, MD;  Location: [facility] ENDOSCOPY;  Service: Gastroenterology;;   TOTAL KNEE ARTHROPLASTY Left 10/16/2012   Patient Active Problem List   Diagnosis Date Noted  Protein-calorie malnutrition, severe 06/02/2023   Calculus of GB and bile duct w acute cholecyst w/o obst 06/02/2023   Acute pancreatitis without infection or necrosis 06/01/2023   Post-ERCP acute pancreatitis 05/31/2023   Choledocholithiasis 05/31/2023   Gastritis and gastroduodenitis 05/30/2023   Atrial fibrillation (HCC) 05/30/2023   Elevated bilirubin 05/29/2023   Generalized abdominal pain 05/29/2023   Cholelithiasis with choledocholithiasis 05/29/2023   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   Elevated LFTs 05/28/2023   OSA on CPAP 05/28/2023   Ascending aortic aneurysm (HCC) 07/26/2020   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   S/P total knee arthroplasty 12/27/2015   Major depressive disorder with single episode, in full  remission (HCC) 12/23/2015   Anxiety 11/16/2015   Benign essential hypertension 11/16/2015   Depression 11/16/2015   Hypersomnia with sleep apnea 11/16/2015   Osteoarthritis 11/16/2015   Pure hypercholesterolemia 11/16/2015   Abdominal aortic aneurysm without rupture (HCC) 12/23/2014   Chronic kidney disease, stage 3 unspecified (HCC) 12/17/2014   ONSET DATE: 02/05/24  REFERRING DIAG:  Diagnosis  E44.1 (ICD-10-CM) - Mild protein-calorie malnutrition   THERAPY DIAG:  Difficulty in walking, not elsewhere classified  Muscle weakness (generalized)  Unsteadiness on feet  Rationale for Evaluation and Treatment: Rehabilitation  SUBJECTIVE:                                                                                                                                                                                             SUBJECTIVE STATEMENT:   Pt tired today, 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 Pt reports gallbladder attack July 14th 2024, went to ED, reports they found a stone blockage. Pt then transferred to [facility] where ERCP was performed to remove stones of common bile duct. This was complicated by pancreatitis, heart failure and new onset of a-fib. He reports MRI taken at time where they found cancer in his liver. Pt has undergone 8 sessions of chemo, he also has a cholecystostomy tube that rests on his R side and down RLE. Pt reports future plans for procedure to cut off blood supply to R side of his liver and to remove gallbladder. Pt also with recent R portal vein, R hepatic vein embolization 12/04/23... 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 PMH significant for CA, mets to liver, HF, hx of 2 knee replacements (years ago), a-fib, AAA, basal  cell carcinoma, chronic kidney  disease, diverticulitis, HTN, melanoma, OA, sleep apnea, vitamin b12 deficiency, chemo 07/2023, 10/2023 cholecystostomy tube placement, 12/04/2023 R portal vein, R hepatic vein emobolization, please refer to chart for full details.   PAIN:  Are you having pain? No pain today.   PRECAUTIONS:  None at the moment;   WEIGHT BEARING RESTRICTIONS: No  FALLS: Has patient fallen in last 6 months? No  LIVING ENVIRONMENT: via chart (from previous outpatient PT course) Lives with: lives with their spouse and has a dog Lives in: House/apartment Stairs: 2 external steps with handrail on L going up Has following equipment at home: shower chair  PLOF: Reports did need some assistance with ADLs until drain recently removed  PATIENT GOALS:  Pt wants to get stronger, get rid of some excess fluid.  OBJECTIVE:                                                                                                                       TREATMENT DATE: 07/17/24 -AMB around lower level and out to healing garden *sit rest break  -Return AMB back from [facility] center entrance -orientation to wellzone triceps pull down, knee extension, hamstrings curls, cable column, lat pulldown, row, chest press -AMB back to rehab gym  PATIENT EDUCATION: Education details:exercise technique Pt educated throughout session about proper posture and technique with exercises. Improved exercise technique, movement at target joints, use of target muscles after min to mod verbal, visual, tactile cues. HEP update Person educated: Patient Education method: Explanation, Demonstration, and Verbal cues, printout Education comprehension: verbalized understanding and returned demonstration  HOME EXERCISE PROGRAM: Access Code: [email or web address] URL: [email or web address] Date: 07/16/2024 Prepared by: Peggye Linear  Exercises - Single Leg Stance with Support  - 3 x weekly - 1 sets - 10 reps - 10 hold -  Standing Tandem Balance with Counter Support  - 3 x weekly - 1 sets - 10 reps - 10 hold - Side Stepping with Resistance at Thighs and Counter Support  - 1 x daily - 7 x weekly - 3 sets - 10 reps - Walking  - 1-2 x daily - 5 x weekly - 8-57m hold  GOALS: Goals reviewed with patient? Yes  SHORT TERM GOALS: Target date: 06/09/2024  Patient will be independent in home exercise program to improve strength/mobility for better functional independence with ADLs. Baseline: initiated  Goal status: MET  LONG TERM GOALS: Target date: 07/21/2024  Patient will increase ABC scale score >95% to demonstrate better functional mobility and better confidence with ADLs and return to confidence levels pt achieved with previous bout of therapy Baseline:  today 91.9% (prior to previous PT d/c reached 96%) 7/21: 96%  Goal status: MET  2.  Patient will complete five times sit to stand test in < 10 seconds indicating an increased LE strength and mm power. Baseline: 12.5 seconds with use of UE (previous PT course pt achieved 10.6 sec) 7/21: 11.66sec no Use of BUE; 07/09/24: 8.63 sec (hands free)  Goal status: ACHIEVED  3.  Patient will increase Functional Gait Assessment score to >27/30 as to reduce fall risk and improve dynamic gait safety with community ambulation  Baseline: 21/30- performed on 04/30/24; 06/23/24: 27 Goal status: PROGRESSIVE  4.  Patient will increase 10 meter walk test to >1.2 m/s as to improve gait speed for better community ambulation and return to or exceed level of function reached with previous bout of PT. Baseline: today at eval 1.23 m/s (previous PT reached 2.1 m/s on 3/19) 7/21: normal walking speed: 0.55m/s. fast walking speed. 1.52m/s Goal status: MET  5.  Patient will increase six minute walk test distance to >1500 ft to achieve or exceed PLOF and improve functional capacity  Baseline: 1066 ft without AD (in previous bout of PT achieved 1596 ft); 7/21: 1129ft no AD with increased back  stiffness on this day; 07/16/24 1269ft Goal status: PROGRESSING  ASSESSMENT:  CLINICAL IMPRESSION:    Final HEP in place, all questions addressed. Pt oriented to gym setting equipment to his satisfaction. Continued with sustained overground AMB which remains biggest limitation since starting PT. PT is ready for DC to independent program at this time and today will be his last visit.    OBJECTIVE IMPAIRMENTS: Abnormal gait, decreased activity tolerance, decreased balance, decreased endurance, difficulty walking, decreased strength, increased edema, improper body mechanics, postural dysfunction, and pain.   ACTIVITY LIMITATIONS: carrying, lifting, squatting, stairs, transfers, and locomotion level  PARTICIPATION LIMITATIONS: cleaning, shopping, community activity, and yard work  PERSONAL FACTORS: Age, Time since onset of injury/illness/exacerbation, and 3+ comorbidities: PMH significant for CA, mets to liver, HF, hx of 2 knee replacements (years ago), a-fib, AAA, basal cell carcinoma, chronic kidney disease, diverticulitis, HTN, melanoma, OA, sleep apnea, vitamin b12 deficiency, chemo 07/2023, 10/2023 cholecystostomy tube placement, 12/04/2023 R portal vein, R hepatic vein emobolization, please refer to chart for full details. are also affecting patient's functional outcome.   REHAB POTENTIAL: Good  CLINICAL DECISION MAKING: Evolving/moderate complexity  EVALUATION COMPLEXITY: Moderate  PLAN:  PT FREQUENCY: 1-2x/week  PT DURATION: 12 weeks  PLANNED INTERVENTIONS: 97164- PT Re-evaluation, 97750- Physical Performance Testing, 97110-Therapeutic exercises, 97530- Therapeutic activity, 97112- Neuromuscular re-education, 97535- Self Care, [id number]- Manual therapy, [phone]- Gait training, [phone]- Orthotic Initial, [phone]- Orthotic/Prosthetic subsequent, [phone]- Canalith repositioning, Patient/Family education, Balance training, Stair training, Taping, Joint mobilization, Spinal mobilization, Scar  mobilization, Vestibular training, DME instructions, Cryotherapy, and Moist heat    12:55 PM, 07/17/24 Peggye JAYSON Linear, PT, DPT Physical Therapist - [place] [facility]  Outpatient Physical Therapy- Main Campus [phone]

## 2024-07-21 ENCOUNTER — Ambulatory Visit

## 2024-07-23 ENCOUNTER — Ambulatory Visit

## 2024-07-28 ENCOUNTER — Ambulatory Visit

## 2024-07-30 ENCOUNTER — Ambulatory Visit

## 2024-08-04 ENCOUNTER — Ambulatory Visit

## 2024-08-06 ENCOUNTER — Ambulatory Visit

## 2024-08-11 ENCOUNTER — Ambulatory Visit

## 2024-08-13 ENCOUNTER — Ambulatory Visit

## 2024-08-18 ENCOUNTER — Ambulatory Visit

## 2024-08-20 ENCOUNTER — Ambulatory Visit

## 2024-08-25 ENCOUNTER — Ambulatory Visit

## 2024-08-27 ENCOUNTER — Ambulatory Visit

## 2024-09-01 ENCOUNTER — Ambulatory Visit

## 2024-09-08 ENCOUNTER — Ambulatory Visit
